# Patient Record
Sex: Female | Born: 1940 | Race: White | Hispanic: No | Smoking: Former smoker
Health system: Southern US, Community
[De-identification: ages and names within clinical notes are randomized; demographics above are authoritative.]

## PROBLEM LIST (undated history)

## (undated) ENCOUNTER — Emergency Department: Disposition: A | Discharge status: Still In Hospital | Payer: PPO

## (undated) DIAGNOSIS — H9190 Unspecified hearing loss, unspecified ear: Secondary | ICD-10-CM

## (undated) DIAGNOSIS — J449 Chronic obstructive pulmonary disease, unspecified: Secondary | ICD-10-CM

## (undated) DIAGNOSIS — I10 Essential (primary) hypertension: Secondary | ICD-10-CM

## (undated) DIAGNOSIS — Z973 Presence of spectacles and contact lenses: Secondary | ICD-10-CM

## (undated) DIAGNOSIS — Z972 Presence of dental prosthetic device (complete) (partial): Secondary | ICD-10-CM

## (undated) DIAGNOSIS — M1990 Unspecified osteoarthritis, unspecified site: Secondary | ICD-10-CM

## (undated) DIAGNOSIS — F419 Anxiety disorder, unspecified: Secondary | ICD-10-CM

## (undated) HISTORY — PX: APPENDECTOMY: SHX54

## (undated) HISTORY — PX: OTHER SURGICAL HISTORY: SHX169

## (undated) HISTORY — DX: Anxiety disorder, unspecified: F41.9

## (undated) HISTORY — DX: Essential (primary) hypertension: I10

## (undated) HISTORY — PX: TUBAL LIGATION: SHX77

---

## 1983-07-21 HISTORY — PX: BREAST SURGERY: SHX581

## 1999-05-26 ENCOUNTER — Other Ambulatory Visit: Admission: RE | Admit: 1999-05-26 | Discharge: 1999-05-26 | Payer: Self-pay | Admitting: Obstetrics and Gynecology

## 2000-06-03 ENCOUNTER — Other Ambulatory Visit: Admission: RE | Admit: 2000-06-03 | Discharge: 2000-06-03 | Payer: Self-pay | Admitting: Obstetrics and Gynecology

## 2000-08-02 ENCOUNTER — Encounter: Payer: Self-pay | Admitting: Obstetrics and Gynecology

## 2000-08-03 ENCOUNTER — Encounter (INDEPENDENT_AMBULATORY_CARE_PROVIDER_SITE_OTHER): Payer: Self-pay | Admitting: Specialist

## 2000-08-03 ENCOUNTER — Ambulatory Visit (HOSPITAL_COMMUNITY): Admission: RE | Admit: 2000-08-03 | Discharge: 2000-08-03 | Payer: Self-pay | Admitting: Obstetrics and Gynecology

## 2001-06-08 ENCOUNTER — Other Ambulatory Visit: Admission: RE | Admit: 2001-06-08 | Discharge: 2001-06-08 | Payer: Self-pay | Admitting: Obstetrics and Gynecology

## 2002-07-25 ENCOUNTER — Other Ambulatory Visit: Admission: RE | Admit: 2002-07-25 | Discharge: 2002-07-25 | Payer: Self-pay | Admitting: Obstetrics and Gynecology

## 2003-10-10 ENCOUNTER — Other Ambulatory Visit: Admission: RE | Admit: 2003-10-10 | Discharge: 2003-10-10 | Payer: Self-pay | Admitting: Obstetrics and Gynecology

## 2004-07-17 ENCOUNTER — Ambulatory Visit: Payer: Self-pay | Admitting: Family Medicine

## 2004-11-27 ENCOUNTER — Other Ambulatory Visit: Admission: RE | Admit: 2004-11-27 | Discharge: 2004-11-27 | Payer: Self-pay | Admitting: Obstetrics and Gynecology

## 2006-03-16 ENCOUNTER — Ambulatory Visit: Payer: Self-pay | Admitting: Internal Medicine

## 2006-11-05 ENCOUNTER — Ambulatory Visit: Payer: Self-pay | Admitting: Family Medicine

## 2006-11-24 ENCOUNTER — Encounter (INDEPENDENT_AMBULATORY_CARE_PROVIDER_SITE_OTHER): Payer: Self-pay | Admitting: Internal Medicine

## 2006-12-06 ENCOUNTER — Ambulatory Visit: Payer: Self-pay | Admitting: Family Medicine

## 2006-12-06 DIAGNOSIS — I10 Essential (primary) hypertension: Secondary | ICD-10-CM | POA: Insufficient documentation

## 2006-12-06 DIAGNOSIS — J309 Allergic rhinitis, unspecified: Secondary | ICD-10-CM | POA: Insufficient documentation

## 2007-01-24 ENCOUNTER — Ambulatory Visit: Payer: Self-pay | Admitting: Internal Medicine

## 2007-02-14 ENCOUNTER — Ambulatory Visit: Payer: Self-pay | Admitting: Internal Medicine

## 2007-02-18 ENCOUNTER — Telehealth (INDEPENDENT_AMBULATORY_CARE_PROVIDER_SITE_OTHER): Payer: Self-pay | Admitting: Internal Medicine

## 2007-02-21 ENCOUNTER — Telehealth (INDEPENDENT_AMBULATORY_CARE_PROVIDER_SITE_OTHER): Payer: Self-pay | Admitting: Internal Medicine

## 2007-04-19 ENCOUNTER — Encounter (INDEPENDENT_AMBULATORY_CARE_PROVIDER_SITE_OTHER): Payer: Self-pay | Admitting: *Deleted

## 2007-05-03 DIAGNOSIS — J45909 Unspecified asthma, uncomplicated: Secondary | ICD-10-CM

## 2007-05-03 DIAGNOSIS — F17200 Nicotine dependence, unspecified, uncomplicated: Secondary | ICD-10-CM | POA: Insufficient documentation

## 2007-05-03 DIAGNOSIS — J45901 Unspecified asthma with (acute) exacerbation: Secondary | ICD-10-CM | POA: Insufficient documentation

## 2007-05-04 ENCOUNTER — Ambulatory Visit: Payer: Self-pay | Admitting: Family Medicine

## 2007-05-04 DIAGNOSIS — F329 Major depressive disorder, single episode, unspecified: Secondary | ICD-10-CM

## 2007-05-04 DIAGNOSIS — F32A Depression, unspecified: Secondary | ICD-10-CM | POA: Insufficient documentation

## 2007-05-16 ENCOUNTER — Ambulatory Visit: Payer: Self-pay | Admitting: Cardiology

## 2007-05-16 LAB — CONVERTED CEMR LAB: TSH: 2.63 microintl units/mL (ref 0.35–5.50)

## 2007-05-23 ENCOUNTER — Ambulatory Visit: Payer: Self-pay

## 2007-05-23 ENCOUNTER — Encounter: Payer: Self-pay | Admitting: Cardiology

## 2007-05-25 ENCOUNTER — Ambulatory Visit: Payer: Self-pay | Admitting: Family Medicine

## 2007-06-03 ENCOUNTER — Ambulatory Visit: Payer: Self-pay | Admitting: Cardiology

## 2007-10-31 ENCOUNTER — Telehealth (INDEPENDENT_AMBULATORY_CARE_PROVIDER_SITE_OTHER): Payer: Self-pay | Admitting: Internal Medicine

## 2007-12-13 ENCOUNTER — Ambulatory Visit: Payer: Self-pay | Admitting: Family Medicine

## 2007-12-13 DIAGNOSIS — R062 Wheezing: Secondary | ICD-10-CM | POA: Insufficient documentation

## 2008-05-24 ENCOUNTER — Ambulatory Visit: Payer: Self-pay | Admitting: Cardiology

## 2008-07-03 ENCOUNTER — Ambulatory Visit: Payer: Self-pay | Admitting: Family Medicine

## 2008-10-10 ENCOUNTER — Encounter (INDEPENDENT_AMBULATORY_CARE_PROVIDER_SITE_OTHER): Payer: Self-pay | Admitting: Internal Medicine

## 2008-11-06 ENCOUNTER — Encounter (INDEPENDENT_AMBULATORY_CARE_PROVIDER_SITE_OTHER): Payer: Self-pay | Admitting: Internal Medicine

## 2008-12-25 ENCOUNTER — Ambulatory Visit: Payer: Self-pay | Admitting: Family Medicine

## 2008-12-26 ENCOUNTER — Telehealth (INDEPENDENT_AMBULATORY_CARE_PROVIDER_SITE_OTHER): Payer: Self-pay | Admitting: Internal Medicine

## 2009-01-08 ENCOUNTER — Ambulatory Visit: Payer: Self-pay | Admitting: Family Medicine

## 2009-01-09 LAB — CONVERTED CEMR LAB
BUN: 20 mg/dL (ref 6–23)
CO2: 29 meq/L (ref 19–32)
Calcium: 9.1 mg/dL (ref 8.4–10.5)
Chloride: 106 meq/L (ref 96–112)
Cholesterol: 147 mg/dL (ref 0–200)
Creatinine, Ser: 1.7 mg/dL — ABNORMAL HIGH (ref 0.4–1.2)
GFR calc non Af Amer: 31.59 mL/min (ref >60)
Glucose, Bld: 96 mg/dL (ref 70–99)
HDL: 48.4 mg/dL (ref >39.00)
LDL Cholesterol: 83 mg/dL (ref 0–99)
Potassium: 4.1 meq/L (ref 3.5–5.1)
Sodium: 143 meq/L (ref 135–145)
Total CHOL/HDL Ratio: 3
Triglycerides: 78 mg/dL (ref 0.0–149.0)
VLDL: 15.6 mg/dL (ref 0.0–40.0)

## 2009-04-22 ENCOUNTER — Encounter (INDEPENDENT_AMBULATORY_CARE_PROVIDER_SITE_OTHER): Payer: Self-pay | Admitting: Internal Medicine

## 2009-06-24 ENCOUNTER — Ambulatory Visit: Payer: Self-pay | Admitting: Family Medicine

## 2009-06-26 LAB — CONVERTED CEMR LAB
BUN: 20 mg/dL (ref 6–23)
CO2: 28 meq/L (ref 19–32)
Calcium: 9.1 mg/dL (ref 8.4–10.5)
Chloride: 104 meq/L (ref 96–112)
Creatinine, Ser: 1.8 mg/dL — ABNORMAL HIGH (ref 0.4–1.2)
GFR calc non Af Amer: 29.54 mL/min (ref >60)
Glucose, Bld: 90 mg/dL (ref 70–99)
Potassium: 3.9 meq/L (ref 3.5–5.1)
Sodium: 141 meq/L (ref 135–145)

## 2009-07-17 ENCOUNTER — Encounter (INDEPENDENT_AMBULATORY_CARE_PROVIDER_SITE_OTHER): Payer: Self-pay | Admitting: Internal Medicine

## 2009-07-17 ENCOUNTER — Ambulatory Visit: Payer: Self-pay | Admitting: Family Medicine

## 2009-07-17 DIAGNOSIS — N259 Disorder resulting from impaired renal tubular function, unspecified: Secondary | ICD-10-CM | POA: Insufficient documentation

## 2009-08-07 ENCOUNTER — Encounter: Payer: Self-pay | Admitting: Family Medicine

## 2009-09-03 ENCOUNTER — Telehealth (INDEPENDENT_AMBULATORY_CARE_PROVIDER_SITE_OTHER): Payer: Self-pay | Admitting: *Deleted

## 2009-10-23 ENCOUNTER — Telehealth: Payer: Self-pay | Admitting: Family Medicine

## 2009-11-13 ENCOUNTER — Ambulatory Visit: Payer: Self-pay | Admitting: Family Medicine

## 2009-11-13 DIAGNOSIS — N183 Chronic kidney disease, stage 3 unspecified: Secondary | ICD-10-CM | POA: Insufficient documentation

## 2010-01-29 ENCOUNTER — Encounter: Payer: Self-pay | Admitting: Family Medicine

## 2010-04-24 ENCOUNTER — Ambulatory Visit: Payer: Self-pay | Admitting: Family Medicine

## 2010-04-25 LAB — CONVERTED CEMR LAB
BUN: 20 mg/dL (ref 6–23)
CO2: 29 meq/L (ref 19–32)
Calcium: 9.1 mg/dL (ref 8.4–10.5)
Chloride: 100 meq/L (ref 96–112)
Creatinine, Ser: 1.7 mg/dL — ABNORMAL HIGH (ref 0.4–1.2)
Direct LDL: 94.8 mg/dL
GFR calc non Af Amer: 31.69 mL/min (ref >60)
Glucose, Bld: 85 mg/dL (ref 70–99)
Potassium: 4.2 meq/L (ref 3.5–5.1)
Sodium: 137 meq/L (ref 135–145)

## 2010-08-14 ENCOUNTER — Ambulatory Visit
Admission: RE | Admit: 2010-08-14 | Discharge: 2010-08-14 | Payer: Self-pay | Source: Home / Self Care | Attending: Family Medicine | Admitting: Family Medicine

## 2010-08-21 NOTE — Consult Note (Signed)
Summary: Bianca [name],[facility]Note  Dr.Alvin [name], [facility]Note   Imported By: Virgia Land 08/13/2009 16:30:06  _____________________________________________________________________  External Attachment:    Type:   Image     Comment:   External Document

## 2010-08-21 NOTE — Assessment & Plan Note (Signed)
Summary: 30 MIN 4 MTH F/U PER [facility] R/S 4/25   Vital Signs:  Patient profile:   70 year old Bianca [name] Height:      66 inches Weight:      142.38 pounds BMI:     23.06 Temp:     98.1 degrees F oral Pulse rate:   84 / minute Pulse rhythm:   regular BP sitting:   114 / 74  (left arm) Cuff size:   regular  Vitals Entered By: Christena Deem CMA Deborra Medina) (November 13, 2009 3:11 PM) CC: 4 month follow up  (30 min)   History of Present Illness: 70 yo Bianca [name] new to me here for follow up BP and COPD.  HTN- last here in 06/2009 and BP was elevated to 158/76.  Has CKD so BP control is very important.  Has been on Diovan 80-12.5 mg, dose was not increased.  Today BP in office MUCH improved at [place].  Trying to watch salt intake and walking a lot more.  COPD- Using Advair daily and proventil only as needed for wheezing or chest tightness-, cannot remember last time she had to use her Proventil.  Walking seems to have really helped her breathing as well.  CKD- stage III, reviewed Dr. Abel Presto notes.  Has follow up scheduled in July.   Current Medications (verified): 1)  Allegra 180 Mg Tabs (Fexofenadine Hcl) .... As Directed 2)  Advair Diskus 100-50 Mcg/dose Misc (Fluticasone-Salmeterol) .Marland Kitchen.. 1 Puff Twice A Day 3)  Proventil Hfa 108 (90 Base) Mcg/act  Aers (Albuterol Sulfate) .... 2 Sprays Every 4 Hr As Needed Wheezing 4)  Multivitamins   Tabs (Multiple Vitamin) .... One Daily 5)  Diovan Hct 80-12.5 Mg Tabs (Valsartan-Hydrochlorothiazide) .... Take 1 Tab Daily  Allergies (verified): No Known Drug Allergies  Review of Systems      See HPI General:  Denies chills and fever. Eyes:  Denies blurring. ENT:  Denies difficulty swallowing. CV:  Denies chest pain or discomfort. Resp:  Denies shortness of breath, sputum productive, and wheezing. Neuro:  Denies headaches and visual disturbances.  Physical Exam  General:  alert, well-developed, well-nourished, and well-hydrated.  NAD Ears:  R ear  normal and L ear normal.   Nose:  External nasal examination shows no deformity or inflammation. Nasal mucosa are pink and moist without lesions or exudates. Mouth:  Oral mucosa and oropharynx without lesions or exudates.  Teeth in good repair. Lungs:  normal respiratory effort, no intercostal retractions, no accessory muscle use, and normal breath sounds.   Heart:  normal rate, regular rhythm, and no murmur.   Extremities:  no edema either lower leg Psych:  normally interactive, flat affect, and subdued.     Impression & Recommendations:  Problem # 1:  HYPERTENSION, BENIGN ESSENTIAL (ICD-401.1) Assessment Improved Congratulated her on her success, continue low salt diet and walking. Called in lower dose of Diovan so whe would not have to cut pills in half. The following medications were removed from the medication list:    Diovan Hct 160-12.5 Mg Tabs (Valsartan-hydrochlorothiazide) .Marland Kitchen... 1/2 tab by mouth daily Her updated medication list for this problem includes:    Diovan Hct 80-12.5 Mg Tabs (Valsartan-hydrochlorothiazide) .Marland Kitchen... Take 1 tab daily  Problem # 2:  ASTHMA (ICD-493.90) Assessment: Unchanged Stable.  Continue Advair with rescue inhaler as needed. Her updated medication list for this problem includes:    Advair Diskus 100-50 Mcg/dose Misc (Fluticasone-salmeterol) .Marland Kitchen... 1 puff twice a day    Proventil Hfa 108 (90 Base)  Mcg/act Aers (Albuterol sulfate) .Marland Kitchen... 2 sprays every 4 hr as needed wheezing  Problem # 3:  CHRONIC KIDNEY DISEASE STAGE III (MODERATE) (ICD-585.3) Assessment: Unchanged likely secondary to HTN. STable, continue controlling BP.  Follow up with Dr. Florene Glen as scheduled.  Complete Medication List: 1)  Allegra 180 Mg Tabs (Fexofenadine hcl) .... As directed 2)  Advair Diskus 100-50 Mcg/dose Misc (Fluticasone-salmeterol) .Marland Kitchen.. 1 puff twice a day 3)  Proventil Hfa 108 (90 Base) Mcg/act Aers (Albuterol sulfate) .... 2 sprays every 4 hr as needed wheezing 4)   Multivitamins Tabs (Multiple vitamin) .... One daily 5)  Diovan Hct 80-12.5 Mg Tabs (Valsartan-hydrochlorothiazide) .... Take 1 tab daily Prescriptions: DIOVAN HCT 80-12.5 MG TABS (VALSARTAN-HYDROCHLOROTHIAZIDE) Take 1 tab daily  #90 x 3   Entered and Authorized by:   Arnette Norris MD   Signed by:   Arnette Norris MD on 11/13/2009   Method used:   Electronically to        [facility]. [phone]* (retail)       [place],   [address]       Ph: [phone] or [phone]       Fax: [phone]   RxID:   [id number]   Current Allergies (reviewed today): No known allergies

## 2010-08-21 NOTE — Assessment & Plan Note (Signed)
Summary: F/U   Vital Signs:  Patient profile:   70 year old female Height:      66 inches Weight:      142 pounds BMI:     23.00 Temp:     98.5 degrees F oral Pulse rate:   68 / minute Pulse rhythm:   regular BP sitting:   120 / 60  (left arm) Cuff size:   regular  Vitals Entered By: Sherrian Divers CMA Deborra Medina) (April 24, 2010 11:46 AM) CC: follow-up visit   History of Present Illness: 70 yo female new here for follow up BP and COPD.  HTN- Dr. Florene Glen (nephrology) added amlodipine 5 mg to her Diovan HCT 80-12.5 mg in July.  BP has been well controlled.  One night, felt a little light headed but she does not think it was related to her antihypertensives.  No CP, SOB, or syncope.   No blurred vision.  Weight stable.    COPD- Using Advair daily and proventil only as needed for wheezing or chest tightness-, cannot remember last time she had to use her Proventil.  Walking seems to have really helped her breathing as well.  CKD- stage III, reviewed Dr. Abel Presto notes from July, stable.   Current Medications (verified): 1)  Allegra 180 Mg Tabs (Fexofenadine Hcl) .... As Directed 2)  Advair Diskus 100-50 Mcg/dose Misc (Fluticasone-Salmeterol) .Marland Kitchen.. 1 Puff Twice A Day 3)  Proventil Hfa 108 (90 Base) Mcg/act  Aers (Albuterol Sulfate) .... 2 Sprays Every 4 Hr As Needed Wheezing 4)  Multivitamins   Tabs (Multiple Vitamin) .... One Daily 5)  Diovan Hct 80-12.5 Mg Tabs (Valsartan-Hydrochlorothiazide) .... Take 1 Tab Daily  Allergies (verified): No Known Drug Allergies  Review of Systems      See HPI General:  Denies malaise. Eyes:  Denies blurring. CV:  Denies chest pain or discomfort. Resp:  Denies shortness of breath.  Physical Exam  General:  alert, well-developed, well-nourished, and well-hydrated.  NAD Mouth:  Oral mucosa and oropharynx without lesions or exudates.  Teeth in good repair. Lungs:  normal respiratory effort, no intercostal retractions, no accessory muscle use, and  normal breath sounds.   Heart:  normal rate, regular rhythm, and no murmur.   Extremities:  no edema either lower leg Neurologic:  alert & oriented X3 and gait normal.   Psych:  normally interactive, flat affect, and subdued.     Impression & Recommendations:  Problem # 1:  CHRONIC KIDNEY DISEASE STAGE III (MODERATE) (ICD-585.3) Assessment Unchanged Stable.   Orders: Venipuncture [id number]) TLB-BMP (Basic Metabolic Panel-BMET) ([id number])  Problem # 2:  HYPERTENSION, BENIGN ESSENTIAL (ICD-401.1) Assessment: Improved Doing well with addition of Amlodipine, will contine to watch for hypotension. Her updated medication list for this problem includes:    Diovan Hct 80-12.5 Mg Tabs (Valsartan-hydrochlorothiazide) .Marland Kitchen... Take 1 tab daily  Problem # 3:  ASTHMA (ICD-493.90) Assessment: Unchanged Doing well, given samples of Advair. Her updated medication list for this problem includes:    Advair Diskus 100-50 Mcg/dose Misc (Fluticasone-salmeterol) .Marland Kitchen... 1 puff twice a day    Proventil Hfa 108 (90 Base) Mcg/act Aers (Albuterol sulfate) .Marland Kitchen... 2 sprays every 4 hr as needed wheezing  Complete Medication List: 1)  Allegra 180 Mg Tabs (Fexofenadine hcl) .... As directed 2)  Advair Diskus 100-50 Mcg/dose Misc (Fluticasone-salmeterol) .Marland Kitchen.. 1 puff twice a day 3)  Proventil Hfa 108 (90 Base) Mcg/act Aers (Albuterol sulfate) .... 2 sprays every 4 hr as needed wheezing 4)  Multivitamins Tabs (Multiple  vitamin) .... One daily 5)  Diovan Hct 80-12.5 Mg Tabs (Valsartan-hydrochlorothiazide) .... Take 1 tab daily  Other Orders: TLB-Cholesterol, Direct LDL ([id number])  Current Allergies (reviewed today): No known allergies

## 2010-08-21 NOTE — Letter (Signed)
Summary: [facility]   Imported By: Edmonia James 02/07/2010 10:51:40  _____________________________________________________________________  External Attachment:    Type:   Image     Comment:   External Document

## 2010-08-21 NOTE — Assessment & Plan Note (Signed)
Summary: ?SINUS INFECTION/CLE   Vital Signs:  Patient profile:   70 year old female Weight:      142.50 pounds Temp:     98.1 degrees F oral Pulse rate:   96 / minute Pulse rhythm:   regular BP sitting:   132 / 82  (left arm) Cuff size:   regular  Vitals Entered By: Maudie Mercury Dance CMA ([facility]) (August 14, 2010 11:03 AM) CC: ? Sinus infection   History of Present Illness: CC: "i think i have a sinus infection"  2d h/o facial pain, nasal congested especially at night.  purulent nasal discharge.  some cough.  + PNdrip.  Some sinus pressure HA yesterday.  Taking allegra for this.    No fevers/chills, abd pain, n/v/d, rashes, myalgias.  + grandson and daughter and son in law with similar sxs. + h/o asthma, quit smoking 5-6 yrs ago No smokers at home.  pt states that has had sinus infections in past, if they get into chest then does poorly.  Current Medications (verified): 1)  Allegra 180 Mg Tabs (Fexofenadine Hcl) .... As Directed 2)  Advair Diskus 100-50 Mcg/dose Misc (Fluticasone-Salmeterol) .Marland Kitchen.. 1 Puff Twice A Day 3)  Proventil Hfa 108 (90 Base) Mcg/act  Aers (Albuterol Sulfate) .... 2 Sprays Every 4 Hr As Needed Wheezing 4)  Multivitamins   Tabs (Multiple Vitamin) .... One Daily 5)  Diovan Hct 80-12.5 Mg Tabs (Valsartan-Hydrochlorothiazide) .... Take 1 Tab Daily  Allergies (verified): No Known Drug Allergies  Past History:  Past Medical History: Last updated: 05/03/2007 Asthma  Past Surgical History: appendectomy cysts removed from foot cysts removed from breast  Social History: quit smoking 2006  Marital Status: Married Children:  Occupation: retired  Review of Systems       per HPI  Physical Exam  General:  alert, well-developed, well-nourished, and well-hydrated.  NAD Head:  Normocephalic and atraumatic without obvious abnormalities. No apparent alopecia or balding.  + maxillary sinus pressure to palpation Eyes:  No corneal or conjunctival inflammation  noted. EOMI. Perrla.  Ears:  TMs clear Nose:  nares congested bilaterally Mouth:  MMM, no pharyngeal erythema Neck:  No deformities, masses, or tenderness noted. Lungs:  normal respiratory effort, no intercostal retractions, no accessory muscle use, and normal breath sounds.   Heart:  normal rate, regular rhythm, and no murmur.   Pulses:  2+ rad pulses Extremities:  no edema either lower leg   Impression & Recommendations:  Problem # 1:  SINUSITIS- ACUTE-NOS (ICD-461.9) likely viral.  hwoever given age and h/o doing poorly with sinusitis in past, WASP script for amox given to hold on to.  advised to fill if not better by next week.  supportive care until then with neti pot, guaifenesin.  Her updated medication list for this problem includes:    Amoxicillin 500 Mg Caps (Amoxicillin) .Marland Kitchen... Take one twice daily x 10 days  Complete Medication List: 1)  Allegra 180 Mg Tabs (Fexofenadine hcl) .... As directed 2)  Advair Diskus 100-50 Mcg/dose Misc (Fluticasone-salmeterol) .Marland Kitchen.. 1 puff twice a day 3)  Proventil Hfa 108 (90 Base) Mcg/act Aers (Albuterol sulfate) .... 2 sprays every 4 hr as needed wheezing 4)  Multivitamins Tabs (Multiple vitamin) .... One daily 5)  Diovan Hct 80-12.5 Mg Tabs (Valsartan-hydrochlorothiazide) .... Take 1 tab daily 6)  Amoxicillin 500 Mg Caps (Amoxicillin) .... Take one twice daily x 10 days  Patient Instructions: 1)  You have an early sinus infection, likely viral.  Antibiotics are not needed. 2)  Take guaifenesin 400mg  IR 1 pill in am and at noon with plenty of fluid to help mobilize mucous (or simple mucinex twice daily with fluid). 3)  Use nasal saline spray or neti pot to help drainage of sinuses. 4)  If you start having fevers >101.5, or are worsening instead of improving as expected, you may need to be seen again. 5)  Antibiotic to hold on to in case not improving as expected, don't fill until weekend or after as viral infections usually last 7-10 days  prior to improving. 6)  Good to see you today, call clinic with questions.  Prescriptions: AMOXICILLIN 500 MG CAPS (AMOXICILLIN) take one twice daily x 10 days  #20 x 0   Entered and Authorized by:   Ria Bush  MD   Signed by:   Ria Bush  MD on 08/14/2010   Method used:   Print then Give to Patient   RxID:   [id number]    Orders Added: 1)  Est. Patient Level III [id number]    Current Allergies (reviewed today): No known allergies

## 2010-08-21 NOTE — Progress Notes (Signed)
  Phone Note From Other Clinic   Summary of Call: Diabetic Teaching information from [facility] mailed to patient.Bianca [name]  September 03, 2009 10:22 AM Initial call taken by: Bianca [name],  September 03, 2009 10:22 AM

## 2010-09-25 ENCOUNTER — Ambulatory Visit (INDEPENDENT_AMBULATORY_CARE_PROVIDER_SITE_OTHER): Payer: MEDICARE | Admitting: Family Medicine

## 2010-09-25 ENCOUNTER — Ambulatory Visit: Payer: Self-pay | Admitting: Family Medicine

## 2010-09-25 ENCOUNTER — Encounter: Payer: Self-pay | Admitting: Family Medicine

## 2010-09-25 DIAGNOSIS — J069 Acute upper respiratory infection, unspecified: Secondary | ICD-10-CM | POA: Insufficient documentation

## 2010-09-27 ENCOUNTER — Encounter: Payer: Self-pay | Admitting: Family Medicine

## 2010-09-30 NOTE — Assessment & Plan Note (Signed)
Summary: CONGESTION,COUGH/CLE   MEDICARE [employer]   Vital Signs:  Patient profile:   70 year old female Height:      66 inches Weight:      142.75 pounds O2 Sat:      97 % on Room air Temp:     98.3 degrees F oral Pulse rate:   110 / minute Pulse rhythm:   regular BP sitting:   120 / 70  (right arm) Cuff size:   regular  Vitals Entered By: Sherrian Divers CMA Deborra Medina) (September 25, 2010 8:58 AM)  O2 Flow:  Room air CC: cough, congestion, sinus pressure   History of Present Illness: CC: "i think i have a sinus infection and bronchitis"  Over 1 week of  nasal congested especially at night.  purulent nasal discharge.  Cough is getting progressively more productive.   No fevers/chills, abd pain, n/v/d, rashes, myalgias.  Taking her Advair, using her albuterol inhaler more frequently over past several days.  + h/o asthma, quit smoking 5-6 yrs ago No smokers at home.   Current Medications (verified): 1)  Allegra 180 Mg Tabs (Fexofenadine Hcl) .... As Directed 2)  Advair Diskus 100-50 Mcg/dose Misc (Fluticasone-Salmeterol) .Marland Kitchen.. 1 Puff Twice A Day 3)  Proventil Hfa 108 (90 Base) Mcg/act  Aers (Albuterol Sulfate) .... 2 Sprays Every 4 Hr As Needed Wheezing 4)  Multivitamins   Tabs (Multiple Vitamin) .... One Daily 5)  Diovan Hct 80-12.5 Mg Tabs (Valsartan-Hydrochlorothiazide) .... Take 1 Tab Daily 6)  Azithromycin 250 Mg  Tabs (Azithromycin) .... 2 By  Mouth Today and Then 1 Daily For 4 Days 7)  Tessalon Perles 100 Mg  Caps (Benzonatate) .Marland Kitchen.. 1 Cap By Mouth Three Times A Day As Needed Cough  Allergies (verified): No Known Drug Allergies  Past History:  Past Medical History: Last updated: 05/03/2007 Asthma  Past Surgical History: Last updated: 08/14/2010 appendectomy cysts removed from foot cysts removed from breast  Social History: Last updated: 08/14/2010 quit smoking 2006  Marital Status: Married Children:  Occupation: retired  Risk Factors: Smoking Status: quit  (05/03/2007)  Review of Systems      See HPI General:  Denies fever. ENT:  Complains of nasal congestion, postnasal drainage, sinus pressure, and sore throat. Resp:  Complains of coughing up blood, sputum productive, and wheezing; denies shortness of breath. GI:  Denies abdominal pain and change in bowel habits.  Physical Exam  General:  alert, well-developed, well-nourished, and well-hydrated.  NAD VSS Nose:  nares congested bilaterally Mouth:  MMM, no pharyngeal erythema Lungs:  normal respiratory effort, no intercostal retractions, no accessory muscle use, exp wheezes, left >right Heart:  normal rate, regular rhythm, and no murmur.   Extremities:  no edema either lower leg Psych:  normally interactive, flat affect, and subdued.     Impression & Recommendations:  Problem # 1:  URI (ICD-465.9) Assessment New  Given duration and progression of symptoms, will treat for bacterial process with Zpack. See pt instructions for details. Her updated medication list for this problem includes:    Allegra 180 Mg Tabs (Fexofenadine hcl) .Marland Kitchen... As directed    Tessalon Perles 100 Mg Caps (Benzonatate) .Marland Kitchen... 1 cap by mouth three times a day as needed cough  Orders: Prescription Created Electronically [phone])  Complete Medication List: 1)  Allegra 180 Mg Tabs (Fexofenadine hcl) .... As directed 2)  Advair Diskus 100-50 Mcg/dose Misc (Fluticasone-salmeterol) .Marland Kitchen.. 1 puff twice a day 3)  Proventil Hfa 108 (90 Base) Mcg/act Aers (Albuterol sulfate) .Marland KitchenMarland KitchenMarland Kitchen  2 sprays every 4 hr as needed wheezing 4)  Multivitamins Tabs (Multiple vitamin) .... One daily 5)  Diovan Hct 80-12.5 Mg Tabs (Valsartan-hydrochlorothiazide) .... Take 1 tab daily 6)  Azithromycin 250 Mg Tabs (Azithromycin) .... 2 by  mouth today and then 1 daily for 4 days 7)  Tessalon Perles 100 Mg Caps (Benzonatate) .Marland Kitchen.. 1 cap by mouth three times a day as needed cough  Patient Instructions: 1)  Take Zpack as directed.  Drink lots of  fluids.  Treat sympotmatically with Mucinex, nasal saline irrigation, and Tylenol/Ibuprofen.You can use warm compresses.  Cough suppressant at night. Call if not improving as expected in 5-7 days.  Prescriptions: TESSALON PERLES 100 MG  CAPS (BENZONATATE) 1 cap by mouth three times a day as needed cough  #30 x 0   Entered and Authorized by:   Arnette Norris MD   Signed by:   Arnette Norris MD on 09/25/2010   Method used:   Electronically to        [facility]. [phone]* (retail)       [place], [place]  [address]       Ph: [phone] or [phone]       Fax: [phone]   RxID:   [id number] AZITHROMYCIN 250 MG  TABS (AZITHROMYCIN) 2 by  mouth today and then 1 daily for 4 days  #6 x 0   Entered and Authorized by:   Arnette Norris MD   Signed by:   Arnette Norris MD on 09/25/2010   Method used:   Electronically to        [facility]. [phone]* (retail)       [place], [place]  [address]       Ph: [phone] or [phone]       Fax: [phone]   RxID:   [id number]    Orders Added: 1)  Prescription Created Electronically [id number] 2)  Est. Patient Level III [id number]    Current Allergies (reviewed today): No known allergies

## 2010-11-18 ENCOUNTER — Other Ambulatory Visit: Payer: Self-pay | Admitting: *Deleted

## 2010-11-18 MED ORDER — VALSARTAN-HYDROCHLOROTHIAZIDE 80-12.5 MG PO TABS: 1.0000 | ORAL_TABLET | Freq: Every day | ORAL | Status: DC

## 2010-11-25 ENCOUNTER — Telehealth: Payer: Self-pay | Admitting: *Deleted

## 2010-11-25 ENCOUNTER — Ambulatory Visit (INDEPENDENT_AMBULATORY_CARE_PROVIDER_SITE_OTHER): Payer: MEDICARE | Admitting: Family Medicine

## 2010-11-25 ENCOUNTER — Encounter: Payer: Self-pay | Admitting: Family Medicine

## 2010-11-25 VITALS — BP 130/64 | HR 60 | Temp 98.3°F | Ht 66.0 in | Wt 134.8 lb

## 2010-11-25 DIAGNOSIS — F4320 Adjustment disorder, unspecified: Secondary | ICD-10-CM | POA: Insufficient documentation

## 2010-11-25 MED ORDER — BUSPIRONE HCL 15 MG PO TABS: 15.0000 mg | ORAL_TABLET | ORAL | Status: DC

## 2010-11-25 MED ORDER — BUSPIRONE HCL 15 MG PO TABS: 15.0000 mg | ORAL_TABLET | Freq: Two times a day (BID) | ORAL | Status: DC

## 2010-11-25 NOTE — Telephone Encounter (Signed)
Patient advised as instructed via telephone. 

## 2010-11-25 NOTE — Telephone Encounter (Signed)
Yes please apologize to her.  I don't know why how that happened.  It is supposed to be twice a day. I will send new rx.

## 2010-11-25 NOTE — Assessment & Plan Note (Signed)
>  25 min spent with face to face with patient counseling and coordinating care Try Buspar 15 mg twice daily Pt to call in 2-3 weeks with update of symptoms. Declining psychotherapy at this time.

## 2010-11-25 NOTE — Telephone Encounter (Signed)
Patient stated that the Rx for Buspar is written for her to take it two times a week, but she stated that in the office visit today Dr. Deborra Medina advised her to take it two times daily.  Please advise.

## 2010-11-25 NOTE — Progress Notes (Signed)
70 yo female new here for anxiety/depression.  Daughter is having a tough time.  Does not want to talk about it but it is making her very anxious and tearful. Has lost 10 pounds in one week.  Can't eat or sleep, gets very anxious. Has tried Zoloft in past, made her "feel sick to her stomach." No panic attacks No SI or HI.    The PMH, PSH, Social History, Family History, Medications, and allergies have been reviewed in [facility], and have been updated if relevant.  Review of Systems       See HPI  Physical Exam BP 130/64  Pulse 60  Temp(Src) 98.3 F (36.8 C) (Oral)  Ht 5\' 6"  (1.676 m)  Wt 134 lb 12.8 oz (61.145 kg)  BMI 21.76 kg/m2 General:  alert, well-developed, well-nourished, and well-hydrated.  NAD Mouth:  Oral mucosa and oropharynx without lesions or exudates.  Teeth in good repair. Neurologic:  alert & oriented X3 and gait normal.   Psych:  normally interactive, flat affect, tearful.

## 2010-12-02 NOTE — Assessment & Plan Note (Signed)
[facility]                            CARDIOLOGY OFFICE NOTE   KIARAH, WIERINGA ANN F                  MRN:          [id number]  DATE:05/16/2007                            DOB:          1940/11/28    Ms. Bortnick is here for a cardiac evaluation.  She is a very pleasant 70-  year-old lady.  She is the mother of Candace.  She has had blood  pressure problems and some palpitations.  She has been on varied doses  of Diovan.  In addition, Avalide and Avapro have been tried.  Most  recently, she is on Diovan hydrochlorothiazide 160/12.5, and she takes  it one-half tablet twice a day.  She says that since this and sertraline  were both started together approximately 10 days ago, she has had  nausea.  The basis of the nausea is not clear, although the new drug in  this case is the sertraline.  She also says that on her blood pressure  cuff at home at times her systolic pressure is in the 90s.  It is  possible that we may need to back off on her medications completely and  watch her blood pressure at home.  She does seem to be somewhat anxious  and her pressure may be higher only when in the doctor's office.  She  does have mild increased resting heart rate.  She has not had syncope or  presyncope.  She does not have chest pain or significant shortness of  breath.   PAST MEDICAL HISTORY:  No known drug allergies.   MEDICATIONS:  1. Diovan hydrochlorothiazide 160/12.5 one-half b.i.d.  2. Sertraline 50.  3. Allegra 180.  4. Albuterol.  5. Advair.   OTHER MEDICAL PROBLEMS:  See the list below.   SOCIAL HISTORY:  The patient is married and retired.  She is here with  her husband today.  She does smoke in the range of 6-18 cigarettes  daily, and we have talked about continuing to wean that.   FAMILY HISTORY:  The family history at this time reveals that there is  no history of significant coronary disease in the family.   REVIEW OF SYSTEMS:  As  mentioned, she has had some nausea.  She is  urinating frequently as a result of the hydrochlorothiazide being added.  Otherwise, her review of systems is negative.   PHYSICAL EXAMINATION:  VITAL SIGNS:  Blood pressure is 140/88 with a  pulse of 105 today.  Her weight is 132 pounds.  GENERAL:  The patient is oriented to person, time and place.  Affect  reveals that she does appear to be a mildly nervous person at this time.  Her husband is helpful and in the room, and their interaction is good.  HEENT:  No xanthelasma.  She has normal extraocular motion.  There are  no carotid bruits.  There is no jugular venous distention.  LUNGS:  Clear.  Respiratory effort is not labored.  CARDIAC:  S1 with an S2.  There are no clicks or significant murmurs.  Her resting heart rate is mildly increased.  ABDOMEN:  Soft.  There are no masses or bruits.  She has normal bowel  sounds.  There is no significant peripheral edema.  Distal pulses are  2+.  There are no major musculoskeletal deformities.   ELECTROCARDIOGRAM:  EKG does reveal nonspecific ST-T wave abnormalities.  There is mild sinus tachycardia at rest.  She also has increased P  waves, compatible with right atrial enlargement.   LABORATORY DATA:  Labs send from [facility] office reveal that in May  her hemoglobin was 12.9.  HDL was 46, and her LDL was 84.  I do not have  a TSH at this time, and this will be checked.  Her BUN was 21 and  creatinine 1.5.   IMPRESSION:  Problems include:  1. Creatinine of 1.5.  2. Mild resting sinus tachycardia.  We will obtain a TSH.  3. Right atrial abnormality by EKG, in sinus.  We will obtain a 2-D      echocardiogram to assess left ventricular function and right      ventricular function and valvular function.  4. Hypertension.  At this point, she does not appear to tolerate her      medicines well.  She is watching her salt intake.  Pressures at      home have been on the low side.  We will decrease  her Diovan      hydrochlorothiazide to one-half tablet daily and have her follow      her pressures at home and document them and bring [place] her reported      data in the office.  5. Ongoing cigarette smoking, 6-8 cigarettes a day, and she is      encouraged to stop completely.  6. Asthma on medications, stable.  7. Some recent nausea.  The patient's only new medicine is Zoloft.      This will be held.  This will not be dangerous for her.  She may      well need an antidepressant over time, but we need to see if this      is playing a role in her nausea.   The patient knows to watch her salt intake carefully.  Zoloft is to be  held and 2-D echocardiogram obtained, along with a TSH.  We will  consider adding a beta blocker for resting sinus tachycardia later after  we have more information.  I will see her back for early follow up.     Carlena Bjornstad, MD, [facility]  Electronically Signed    JDK/MedQ  DD: 05/16/2007  DT: 05/16/2007  Job #: [id number]   cc:   Billie D. Bean, FNP

## 2010-12-02 NOTE — Assessment & Plan Note (Signed)
[facility]                            CARDIOLOGY OFFICE NOTE   Bianca [name]                  MRN:          [id number]  DATE:05/24/2008                            DOB:          12/26/1940    [name] is seen for followup.  She has had some mild resting sinus  tachycardia.  She is actually doing well.  She is not having any chest  pain or shortness of breath.  She has had no syncope or presyncope.  She  says she feels much better since she started drinking more of fluid.  This is not optimal with her being on a diuretic, but she feels well  overall.   ALLERGIES:  No known drug allergies.   MEDICATIONS:  See the flow sheet.   OTHER MEDICAL PROBLEMS:  See the note of June 03, 2007.   REVIEW OF SYSTEMS:  She has no GI or GU complaints.  She is not having  any fevers or chills or headaches.  Her review of systems otherwise is  negative.   PHYSICAL EXAMINATION:  VITAL SIGNS:  Blood pressure is 135/84 with a  pulse of 83.  GENERAL  The patient is oriented to person, time, and place.  Affect is  normal.  HEENT:  No xanthelasma.  She has normal extraocular motion.  NECK:  There are no carotid bruits.  There is no jugular venous  distention.  LUNGS:  Clear.  Respiratory effort is not labored.  CARDIAC:  S1 with an S2.  There are no clicks or significant murmurs.  ABDOMEN:  Soft.  EXTREMITIES:  She has no peripheral edema.   EKG reveals normal sinus rhythm.   Problems are listed on my note of June 03, 2007.  There is a history  of mild resting sinus tachycardia.  This is stable and needs no further  workup.  Her blood pressure is controlled.  I talked to her about her  smoking.  She has cut down to only a few cigarettes a day.  She will  follow with Billie Bean.  No further cardiac workup.     Carlena Bjornstad, MD, [facility]  Electronically Signed    JDK/MedQ  DD: 05/24/2008  DT: 05/25/2008  Job #: [id number]   cc:   Billie D.  Bean, FNP

## 2010-12-02 NOTE — Assessment & Plan Note (Signed)
[facility]                            CARDIOLOGY OFFICE NOTE   Bianca [name], Bianca [name]                  MRN:          [id number]  DATE:06/03/2007                            DOB:          Nov 19, 1940    Bianca [name] is now doing well.  See my complete note of May 16, 2007.  We decided to leave her on 1/2 of a Diovan/hydrochlorothiazide 60/12.5.  Her Sertraline was stopped.  Her nausea is better.  Her 2-D  echocardiogram shows excellent left ventricular function and no  significant valvular abnormalities.  She is feeling better.  Her TSH is  normal.  Her blood pressures since her last visit range from 110/62 to  145/87.  She does have significant stress at home related to a sister  and other more distant family members.  However, she is stable.  She has  not had any chest pain.  I have had a careful discussion with her today  and her husband.   PAST MEDICAL HISTORY:  For other medical problems, see the list below.   ALLERGIES:  No known drug allergies.   MEDICATIONS:  1. Diovan/hydrochlorothiazide 160/12.5 1/2 tablet daily.  2. Allegra.  3. Albuterol.  4. Advair.   REVIEW OF SYSTEMS:  She is doing well and her review of systems now is  negative.   PHYSICAL EXAMINATION:  VITAL SIGNS:  Blood pressure today is 110/70 with  pulse of 87.  GENERAL:  The patient is oriented to person, time and place. Affect is  normal.  She is here with her husband in the room at the time of  evaluation.  HEENT:  Reveals no xanthelasma.  She has normal extraocular motion.  NECK:  There are no carotid bruits.  There is no jugular venous  distention.  LUNGS:  Clear.  Respiratory effort is not labored.  CARDIAC:  Exam reveals an S1 with an S2.  There are no clicks or  significant murmurs.  ABDOMEN:  Soft.  EXTREMITIES:  She has no peripheral edema.   As mentioned above, her TSH is normal and echocardiogram reveals no  significant abnormalities.   PROBLEM LIST:   Problems include:  1. History of creatinine 1.5.  2. Mild resting sinus tachycardia.  TSH is normal.  I believe it will      be best to follow the patient.  She could receive a small dose of      beta blocker.  However, I feel this is not absolutely necessary and      she and her husband would like to not have any additional      medicines.  3. Normal left ventricular function.  4. Hypertension.  This is nicely controlled on her current medicines.  5. Ongoing cigarette smoking, 6 to 8 per day.  She is once again      encouraged to stop but she says she cannot because of the stress in      her life.  6. History of asthma.  7. History of recent nausea which has improved.   PLAN:  I will see her back in one year.  Bianca Bjornstad, MD, Bianca [name]  Electronically Signed    JDK/MedQ  DD: 06/03/2007  DT: 06/04/2007  Job #: [id number]   cc:   Bianca Sams, FNP, [place]

## 2010-12-05 NOTE — Op Note (Signed)
[facility]  Patient:    Bianca [name], Bianca [name]                        MRN: [id number] Proc. Date: 08/03/00 Attending:  Harrold Donath, M.D.                           Operative Report  PREOPERATIVE DIAGNOSES:       1. Thickened endometrium.                               2. Endometrial polyp on hysterosonogram.  POSTOPERATIVE DIAGNOSES:      1. Postmenopausal.                               2. Thickened endometrium.                               3. Endometrial polyp.  SURGEON:                      Harrold Donath, M.D.  ANESTHESIA:                   General.  PACKS:                        None.  CATHETERS:                    None.  MEDIUM:                       Sorbitol.  DEFICIT:                      110 cc.  FINDINGS:                     The uterus sounded to 8 cm.  There was a polyp the entire length of the endometrial cavity.  It seemed to be attached to the fundus at the left lateral wall.  DESCRIPTION OF PROCEDURE:     The patient was taken to the operating room. After satisfactory general anesthesia with the patient in the prone position, she was prepped and draped as a sterile field.  The bladder was emptied with catheterization.  The uterus felt midplane.  A weighted speculum was placed in the posterior vagina.  The anterior cervix was grasped with a Jacobs tenaculum and pulled up.  The external os was dilated dilated with a hemostat.  The uterus then sounded to 8 cm.  It was progressively dilated to a #25.  The observation scope was placed in and we could see a polyp running the entire length of the endometrial cavity.  We took pictures.  We removed the observation scope and put the resectoscope in.  We remove some of the polyp with Randall stone grasping forceps.  We then dilated to a #33 and inserted the resectoscope and resected the upper portion of the polyp and some of the endometrial cavity.  We then cauterized any areas  that were bleeding.  We then progressively decreased the pressure.  She had no excess bleeding.  We removed the scope and watched the patient for two  minutes to see if she had any heavy bleeding, and she did not.  The procedure was terminated.  The patient was taken to the recovery room in good condition. DD:  08/03/00 TD:  08/03/00 Job: [id number]

## 2010-12-05 NOTE — H&P (Signed)
[facility]  Patient:    Bianca [name], Bianca [name]                        MRN: [id number] Adm. Date:  08/03/00 Attending:  Harrold Donath, M.D.                         History and Physical  OFFICE CHART:                 [id number]  REASON FOR ADMISSION:         This patient is for hysteroscopy and D&C on August 03, 2000.  HISTORY OF PRESENT ILLNESS:   This 70 year old female, para 3-1-2, is admitted to the hospital for hysteroscopy and evaluation of endometrial mass.  She had an ultrasound on July 27, 2000, to evaluate the endometrial cavity at which time she had a hysterosonography.  She had had a previous ultrasound which showed a thickened endometrium before Christmas.  The uterus measured 4.2 x 2.5 x 4.2.  The endometrial cavity had previously measured 5.4 mm with a questionable polyp .5 x .2.  We injected, and she had a .9 x .4 mass at what looked like at the fundus.  This could be a fibroid but probably a polyp.  She has no other fibroids.  The patient is admitted for resection and removal.  The complications with perforation had been explained to the patient, and she understands.  The patient has been a smoker.  She has had no smoking since December 2001.  The patient is currently taking Prempro, and she has had no bleeding.  MEDICATIONS:                  1. Wellbutrin b.i.d. for smoking.                               2. Chlor-Trimeton for sinuses.                               3. Prempro 2.5.  REVIEW OF SYSTEMS:            She has had no problem with sinus infection. She has had no recent respiratory problems.  PAST MEDICAL HISTORY:         She has had pneumonia in the past and was treated.  She has had tetanus shot about 6-7 years ago.  FAMILY HISTORY:               She has a brother and a father who had BTs.  She has a brother with heart disease.  She has a brother with a drinking problem. She has a sister with breast cancer at age 45.  PAST  SURGICAL HISTORY:        Appendectomy, D&C, wisdom teeth, tubal ligation, foot surgery.  PHYSICAL EXAMINATION:  GENERAL:                      Well-developed, well-nourished female, oriented and alert.  VITAL SIGNS:                  Blood pressure 157/95, weight 153.  HEART:                        Sounds good.  LUNGS:  Clear.  BREASTS:                      No masses.  LIVER:                        Not enlarged.  SPLEEN:                       Not enlarged.  VAGINA:                       Cervix epithelialized.  She has good vaginal support.  Uterus is not felt to be enlarged.  No masses felt at adnexa.  RECTAL:                       Negative.  DISPOSITION:                  Endometrial polyp or fibroid of the endometrial cavity, thickened endometrium, postmenopausal, on hormone replacement therapy which is Prempro 2.5, on Wellbutrin to stop smoking.  Admit for surgery. DD:  08/02/00 TD:  08/02/00 Job: [id number]

## 2010-12-16 ENCOUNTER — Other Ambulatory Visit: Payer: Self-pay | Admitting: *Deleted

## 2010-12-16 MED ORDER — VALSARTAN-HYDROCHLOROTHIAZIDE 80-12.5 MG PO TABS: 1.0000 | ORAL_TABLET | Freq: Every day | ORAL | Status: DC

## 2011-01-22 ENCOUNTER — Encounter: Payer: Self-pay | Admitting: Family Medicine

## 2011-01-22 ENCOUNTER — Ambulatory Visit (INDEPENDENT_AMBULATORY_CARE_PROVIDER_SITE_OTHER): Payer: Medicare Other | Admitting: Family Medicine

## 2011-01-22 VITALS — BP 120/62 | HR 96 | Temp 98.4°F | Wt 133.5 lb

## 2011-01-22 DIAGNOSIS — J45909 Unspecified asthma, uncomplicated: Secondary | ICD-10-CM

## 2011-01-22 DIAGNOSIS — J069 Acute upper respiratory infection, unspecified: Secondary | ICD-10-CM

## 2011-01-22 MED ORDER — AZITHROMYCIN 250 MG PO TABS: ORAL_TABLET | ORAL | Status: DC

## 2011-01-22 MED ORDER — ALBUTEROL SULFATE (2.5 MG/3ML) 0.083% IN NEBU
2.5000 mg | INHALATION_SOLUTION | Freq: Once | RESPIRATORY_TRACT | Status: AC
  Administered 2011-01-22: 2.5 mg via RESPIRATORY_TRACT

## 2011-01-22 MED ORDER — IPRATROPIUM BROMIDE 0.02 % IN SOLN
0.5000 mg | Freq: Once | RESPIRATORY_TRACT | Status: AC
  Administered 2011-01-22: 0.5 mg via RESPIRATORY_TRACT

## 2011-01-22 NOTE — Progress Notes (Signed)
CC: "i think i have  bronchitis"  Over 1 week of  Cough.  Cough is getting progressively more productive and wheezing is worsening.   No fevers/chills, abd pain, n/v/d, rashes, myalgias.  Taking her Advair, using her albuterol inhaler more frequently over past several days, used it 3 times yesterday.  + h/o asthma, quit smoking 7 years ago No smokers at home.   Patient Active Problem List  Diagnoses  . TOBACCO ABUSE  . DEPRESSION  . HYPERTENSION, BENIGN ESSENTIAL  . ALLERGIC  RHINITIS  . ASTHMA  . CHRONIC KIDNEY DISEASE STAGE III (MODERATE)  . RENAL INSUFFICIENCY  . WHEEZING  . URI  . Adjustment disorder   Past Medical History  Diagnosis Date  . Asthma    Past Surgical History  Procedure Date  . Appendectomy   . Other surgical history     cysts removed from foot  . Other surgical history     cysts removed from breast   History  Substance Use Topics  . Smoking status: Former Smoker    Quit date: 07/20/2004  . Smokeless tobacco: Not on file  . Alcohol Use: Not on file   No family history on file. No Known Allergies Current Outpatient Prescriptions on File Prior to Visit  Medication Sig Dispense Refill  . albuterol (PROVENTIL HFA) 108 (90 BASE) MCG/ACT inhaler Inhale 2 puffs into the lungs every 4 (four) hours as needed. For wheezing       . busPIRone (BUSPAR) 15 MG tablet Take 1 tablet (15 mg total) by mouth 2 (two) times daily.  60 tablet  11  . fexofenadine (ALLEGRA) 180 MG tablet Take 180 mg by mouth as directed.        . Fluticasone-Salmeterol (ADVAIR DISKUS) 100-50 MCG/DOSE AEPB Inhale 1 puff into the lungs 2 (two) times daily.        . Multiple Vitamin (MULTIVITAMIN) tablet Take 1 tablet by mouth daily.        . valsartan-hydrochlorothiazide (DIOVAN-HCT) 80-12.5 MG per tablet Take 1 tablet by mouth daily.  30 tablet  6   The PMH, PSH, Social History, Family History, Medications, and allergies have been reviewed in [facility], and have been updated if  relevant.   Review of Systems       See HPI General:  Denies fever. CVS:  No chest pain Resp:  Complains of coughing up blood, sputum productive, and wheezing; denies shortness of breath. GI:  Denies abdominal pain and change in bowel habits.  Physical Exam BP 120/62  Pulse 96  Temp(Src) 98.4 F (36.9 C) (Oral)  Wt 133 lb 8 oz (60.555 kg)  General:  alert, well-developed, well-nourished, and well-hydrated.  NAD VSS Nose:  nares congested bilaterally Mouth:  MMM, no pharyngeal erythema Lungs:  normal respiratory effort, no intercostal retractions, no accessory muscle use, diffuse bilateral exp wheezes Heart:  normal rate, regular rhythm, and no murmur.   Extremities:  no edema either lower leg Psych:  normally interactive, flat affect, and subdued.    Assessment and Plan: 1. URI    New.   With bronchitis. Will treat with Zpack. Duonebs given in office.

## 2011-04-13 ENCOUNTER — Other Ambulatory Visit: Payer: Self-pay | Admitting: *Deleted

## 2011-04-13 MED ORDER — FLUTICASONE-SALMETEROL 100-50 MCG/DOSE IN AEPB: 1.0000 | INHALATION_SPRAY | Freq: Two times a day (BID) | RESPIRATORY_TRACT | Status: DC

## 2011-04-15 ENCOUNTER — Other Ambulatory Visit: Payer: Self-pay | Admitting: *Deleted

## 2011-05-18 ENCOUNTER — Telehealth: Payer: Self-pay | Admitting: Family Medicine

## 2011-05-18 NOTE — Telephone Encounter (Signed)
Patient has been on prescription for Divan and pharmacy has filled it as generic today.  She has always taken it as brand name and has not taken generic in the past.  She said she has had no problems with the brand and has questions about why it was changed to generic.  Patient call back is [phone]

## 2011-05-19 MED ORDER — VALSARTAN-HYDROCHLOROTHIAZIDE 80-12.5 MG PO TABS: 1.0000 | ORAL_TABLET | Freq: Every day | ORAL | Status: DC

## 2011-05-19 NOTE — Telephone Encounter (Signed)
Ok to send in trade name Diovan.

## 2011-05-19 NOTE — Telephone Encounter (Signed)
Rx sent to pharmacy for brand name Diovan.  Patient notified via telephone.

## 2011-05-28 ENCOUNTER — Encounter: Payer: Self-pay | Admitting: Family Medicine

## 2011-05-28 ENCOUNTER — Ambulatory Visit (INDEPENDENT_AMBULATORY_CARE_PROVIDER_SITE_OTHER): Payer: Medicare Other | Admitting: Family Medicine

## 2011-05-28 VITALS — BP 150/80 | HR 86 | Temp 97.8°F | Wt 134.2 lb

## 2011-05-28 DIAGNOSIS — F329 Major depressive disorder, single episode, unspecified: Secondary | ICD-10-CM

## 2011-05-28 DIAGNOSIS — F3289 Other specified depressive episodes: Secondary | ICD-10-CM

## 2011-05-28 DIAGNOSIS — F4320 Adjustment disorder, unspecified: Secondary | ICD-10-CM

## 2011-05-28 DIAGNOSIS — N259 Disorder resulting from impaired renal tubular function, unspecified: Secondary | ICD-10-CM

## 2011-05-28 DIAGNOSIS — I10 Essential (primary) hypertension: Secondary | ICD-10-CM

## 2011-05-28 NOTE — Progress Notes (Signed)
70 yo female here for follow up BP and COPD.  HTN- BP elevated today but has not yet taken her medication.   Has CKD so BP control is very important.  Has been on Diovan 80-12.5 mg.  BPs have been stable at nephrologist and at home.  COPD- Using Advair daily and proventil only as needed for wheezing or chest tightness-, cannot remember last time she had to use her Proventil, thinks it was this summer.  CKD- stage III, reviewed Dr. Abel Presto notes.  Had follow up in July.  Cr stable.   Patient Active Problem List  Diagnoses  . TOBACCO ABUSE  . DEPRESSION  . HYPERTENSION, BENIGN ESSENTIAL  . ALLERGIC  RHINITIS  . ASTHMA  . CHRONIC KIDNEY DISEASE STAGE III (MODERATE)  . RENAL INSUFFICIENCY  . WHEEZING  . URI  . Adjustment disorder   Past Medical History  Diagnosis Date  . Asthma    Past Surgical History  Procedure Date  . Appendectomy   . Other surgical history     cysts removed from foot  . Other surgical history     cysts removed from breast   History  Substance Use Topics  . Smoking status: Former Smoker    Quit date: 07/20/2004  . Smokeless tobacco: Not on file  . Alcohol Use: Not on file   No family history on file. No Known Allergies Current Outpatient Prescriptions on File Prior to Visit  Medication Sig Dispense Refill  . albuterol (PROVENTIL HFA) 108 (90 BASE) MCG/ACT inhaler Inhale 2 puffs into the lungs every 4 (four) hours as needed. For wheezing       . busPIRone (BUSPAR) 15 MG tablet Take 1 tablet (15 mg total) by mouth 2 (two) times daily.  60 tablet  11  . fexofenadine (ALLEGRA) 180 MG tablet Take 180 mg by mouth as directed.        . Fluticasone-Salmeterol (ADVAIR DISKUS) 100-50 MCG/DOSE AEPB Inhale 1 puff into the lungs 2 (two) times daily.  60 each  12  . Multiple Vitamin (MULTIVITAMIN) tablet Take 1 tablet by mouth daily.        . valsartan-hydrochlorothiazide (DIOVAN-HCT) 80-12.5 MG per tablet Take 1 tablet by mouth daily.  30 tablet  6        Allergies (verified):  No Known Drug Allergies  Review of Systems       See HPI General:  Denies chills and fever. Eyes:  Denies blurring. ENT:  Denies difficulty swallowing. CV:  Denies chest pain or discomfort. Resp:  Denies shortness of breath, sputum productive, and wheezing. Neuro:  Denies headaches and visual disturbances.  Physical Exam BP 150/80  Pulse 86  Temp(Src) 97.8 F (36.6 C) (Oral)  Wt 134 lb 4 oz (60.895 kg)  General:  alert, well-developed, well-nourished, and well-hydrated.  NAD Ears:  R ear normal and L ear normal.   Nose:  External nasal examination shows no deformity or inflammation. Nasal mucosa are pink and moist without lesions or exudates. Mouth:  Oral mucosa and oropharynx without lesions or exudates.  Teeth in good repair. Lungs:  normal respiratory effort, no intercostal retractions, no accessory muscle use, and normal breath sounds.   Heart:  normal rate, regular rhythm, and no murmur.   Extremities:  no edema either lower leg Psych:  normally interactive, flat affect, and subdued.    Assessment and Plan: 70 yo female here for 6 month follow up.  DM- a1c was great in June- 6.3, best it has been.  Was as high as 12.0 last March. Checks his CBGs three times a day, fasting is usually around 100-125.  Takes Levemir 20 units at bedtime and uses his own Novlog sliding scale at night.  Denies any episodes of hypoglycemia.  Just got his diabetic shoes, loves them.  Very comfortable. Denies any neuropathy.    HLD- LDL not at goal for diabetic.     HLD- LDL 130, HDL 37, TG 118 in June.  Has cut out saturated fats completely, now only eats fat free cheese.  Current Medications (verified):  1)  Multivitamins  Tabs (Multiple Vitamin) .Marland Kitchen.. 1 Daily As Per Patient 2)  Bd Pen Needle Mini U/f 31g X 5 Mm Misc (Insulin Pen Needle) .... Use With Levemir Flex Pen 3)  Levemir Flexpen 100 Unit/ml Soln (Insulin Detemir) .... 20 Units At Bedtime For  Dm 4)  Prinivil 10 Mg Tabs (Lisinopril) .Marland Kitchen.. 1 Daily By Mouth 5)  Freestyle Lite Test  Strp (Glucose Blood) .... Use 1 To 3 Times A Day As Needed For  Flucuating Blood Sugars Icd-9 Code: 250.00 6)  Freestyle Lancets  Misc (Lancets) .... Use 1 To Three Times A Day To Check Blood Sugars Icd-9 Code 250.00 7)  Novolog Flexpen 100 Unit/ml Soln (Insulin Aspart) .... 8 Units Before Supper  Allergies (verified):  No Known Drug Allergies  Past History:  Past Medical History: Last updated: 10/12/2008 Diverticulosis, colon  Past Surgical History: Last updated: Nov 18, 2008 diverticulitis by flex:(1989)  Family History: Last updated: 11/18/2008 Father: DECEASED WITH BPH WITH TURB// PARKINSONS  AT AGE77 Mother: DECEASED AT AGE 42 Siblings:2 BROTHERS 1 SISTER CV: NEGATIVE HBP: NEGATIVE DM: NEGATIVE GOUT/ARTHRITIS: PROSTATE CANCER: BREAST/OVARIAN/UTERINE CANCER: NEGATIVE COLON CANCER: NEGATIVE DEPRESSION: NEGATIVE ETOH/DRUG ABUSE: NEGATIVE OTHER: NEGATIVE STROKE   Social History: Last updated: 10/05/2008 Marital Status: Married Children: 2 Occupation: retired from [employer] Factors: Alcohol Use: 0 (10/05/2008) Caffeine Use: 3 (10/05/2008) Exercise: yes (10/05/2008)  Risk Factors: Smoking Status: never (10/05/2008)  Review of Systems       See HPI General:  Denies malaise. Eyes:  Denies blurring. ENT:  Denies difficulty swallowing. CV:  Denies chest pain or discomfort. Resp:  Denies shortness of breath. GI:  Denies abdominal pain and change in bowel habits.  Physical Exam BP 150/80  Pulse 86  Temp(Src) 97.8 F (36.6 C) (Oral)  Wt 134 lb 4 oz (60.895 kg) BP Readings from Last 3 Encounters:  05/28/11 150/80  01/22/11 120/62  11/25/10 130/64    General:  alert, well-developed, well-nourished, and well-hydrated.   Eyes:  vision grossly intact and pupils equal.   Ears:  R ear normal and L ear normal.   Nose:  no external deformity.   Mouth:  good dentition  and no gingival abnormalities.   Lungs:  Normal respiratory effort, chest expands symmetrically. Lungs are clear to auscultation, no crackles or wheezes. Heart:  Normal rate and regular rhythm. S1 and S2 normal without gallop, murmur, click, rub or other extra sounds. Extremities:  no edema Neurologic:  alert & oriented X3 and gait normal.   Skin:  Intact without suspicious lesions or rashes Psych:  normally interactive, very pleasant.  Assessment and Plan: 1. HYPERTENSION, BENIGN ESSENTIAL  Deteriorated but has not yet taken her medication. Advised taking it as soon as she gets home and to keep [place] posted with her readings.  2. COPD Stable on Advair.  Not requiring rescue inhaler.  3. RENAL INSUFFICIENCY  Stable, follow with renal.

## 2011-05-28 NOTE — Patient Instructions (Signed)
Good to see you, Bianca [name]. Have a wonderful holiday season.

## 2011-07-13 ENCOUNTER — Other Ambulatory Visit: Payer: Self-pay | Admitting: Family Medicine

## 2011-11-18 ENCOUNTER — Ambulatory Visit (INDEPENDENT_AMBULATORY_CARE_PROVIDER_SITE_OTHER): Payer: Medicare Other | Admitting: Family Medicine

## 2011-11-18 ENCOUNTER — Encounter: Payer: Self-pay | Admitting: Family Medicine

## 2011-11-18 ENCOUNTER — Telehealth: Payer: Self-pay | Admitting: *Deleted

## 2011-11-18 VITALS — BP 138/70 | HR 86 | Temp 97.9°F | Wt 135.0 lb

## 2011-11-18 DIAGNOSIS — J069 Acute upper respiratory infection, unspecified: Secondary | ICD-10-CM

## 2011-11-18 MED ORDER — ALBUTEROL SULFATE HFA 108 (90 BASE) MCG/ACT IN AERS: 2.0000 | INHALATION_SPRAY | RESPIRATORY_TRACT | Status: DC | PRN

## 2011-11-18 MED ORDER — AZITHROMYCIN 250 MG PO TABS: ORAL_TABLET | ORAL | Status: DC

## 2011-11-18 NOTE — Progress Notes (Signed)
71 yo here for:  Over 2 weeks of productive cough and wheezing.    Cough is getting progressively more productive and wheezing is worsening.    She ran out of her advair and albuterol.  No fevers/chills, abd pain, n/v/d, rashes, myalgias.   + h/o asthma, quit smoking 7 years ago No smokers at home.   Patient Active Problem List  Diagnoses  . TOBACCO ABUSE  . DEPRESSION  . HYPERTENSION, BENIGN ESSENTIAL  . ALLERGIC  RHINITIS  . ASTHMA  . CHRONIC KIDNEY DISEASE STAGE III (MODERATE)  . RENAL INSUFFICIENCY  . WHEEZING  . URI  . Adjustment disorder   Past Medical History  Diagnosis Date  . Asthma    Past Surgical History  Procedure Date  . Appendectomy   . Other surgical history     cysts removed from foot  . Other surgical history     cysts removed from breast   History  Substance Use Topics  . Smoking status: Former Smoker    Quit date: 07/20/2004  . Smokeless tobacco: Not on file  . Alcohol Use: Not on file   No family history on file. No Known Allergies Current Outpatient Prescriptions on File Prior to Visit  Medication Sig Dispense Refill  . busPIRone (BUSPAR) 15 MG tablet Take 1 tablet (15 mg total) by mouth 2 (two) times daily.  60 tablet  11  . DIOVAN HCT 80-12.5 MG per tablet TAKE 1 TABLET BY MOUTH DAILY.  30 tablet  12  . fexofenadine (ALLEGRA) 180 MG tablet Take 180 mg by mouth as directed.        . Fluticasone-Salmeterol (ADVAIR DISKUS) 100-50 MCG/DOSE AEPB Inhale 1 puff into the lungs 2 (two) times daily.  60 each  12  . Multiple Vitamin (MULTIVITAMIN) tablet Take 1 tablet by mouth daily.        . valsartan-hydrochlorothiazide (DIOVAN-HCT) 80-12.5 MG per tablet Take 1 tablet by mouth daily.  30 tablet  6   The PMH, PSH, Social History, Family History, Medications, and allergies have been reviewed in [facility], and have been updated if relevant.   Review of Systems       See HPI General:  Denies fever. CVS:  No chest pain Resp:  denies shortness of  breath. GI:  Denies abdominal pain and change in bowel habits.  Physical Exam BP 138/70  Pulse 86  Temp(Src) 97.9 F (36.6 C) (Oral)  Wt 135 lb (61.236 kg)  SpO2 96%  General:  alert, well-developed, well-nourished, and well-hydrated.  NAD VSS Nose:  nares congested bilaterally Mouth:  MMM, no pharyngeal erythema Lungs:  normal respiratory effort, no intercostal retractions, no accessory muscle use, diffuse bilateral exp wheezes Heart:  normal rate, regular rhythm, and no murmur.   Extremities:  no edema either lower leg Psych:  normally interactive, flat affect, and subdued.    Assessment and Plan: 1. URI    New.   With bronchitis. Will treat with Zpack. Advair and proair samples given.

## 2011-11-18 NOTE — Telephone Encounter (Signed)
Chart opened in error

## 2011-11-18 NOTE — Progress Notes (Signed)
Samples given per Dr. Deborra Medina:  advair 11/50, 2 boxes given.  Lot number [id number], exp 11/2012.  Albuterol, one box of 90 mcg's given.  Lot number [id number], exp 10/2012.

## 2011-12-04 ENCOUNTER — Ambulatory Visit (INDEPENDENT_AMBULATORY_CARE_PROVIDER_SITE_OTHER): Payer: Medicare Other | Admitting: Family Medicine

## 2011-12-04 ENCOUNTER — Encounter: Payer: Self-pay | Admitting: Family Medicine

## 2011-12-04 VITALS — BP 150/82 | HR 76 | Temp 97.8°F | Wt 136.0 lb

## 2011-12-04 DIAGNOSIS — J309 Allergic rhinitis, unspecified: Secondary | ICD-10-CM

## 2011-12-04 DIAGNOSIS — I10 Essential (primary) hypertension: Secondary | ICD-10-CM

## 2011-12-04 DIAGNOSIS — Z136 Encounter for screening for cardiovascular disorders: Secondary | ICD-10-CM

## 2011-12-04 DIAGNOSIS — N183 Chronic kidney disease, stage 3 unspecified: Secondary | ICD-10-CM

## 2011-12-04 DIAGNOSIS — F3289 Other specified depressive episodes: Secondary | ICD-10-CM

## 2011-12-04 DIAGNOSIS — F329 Major depressive disorder, single episode, unspecified: Secondary | ICD-10-CM

## 2011-12-04 LAB — LIPID PANEL
Cholesterol: 166 mg/dL (ref 0–200)
HDL: 50 mg/dL (ref >39)
Total CHOL/HDL Ratio: 3.3 Ratio
Triglycerides: 117 mg/dL (ref <150)

## 2011-12-04 LAB — COMPREHENSIVE METABOLIC PANEL
Albumin: 4.2 g/dL (ref 3.5–5.2)
BUN: 22 mg/dL (ref 6–23)
CO2: 31 mEq/L (ref 19–32)
Calcium: 9.8 mg/dL (ref 8.4–10.5)
Chloride: 105 mEq/L (ref 96–112)
Creat: 1.9 mg/dL — ABNORMAL HIGH (ref 0.50–1.10)
Glucose, Bld: 110 mg/dL — ABNORMAL HIGH (ref 70–99)
Potassium: 4.9 mEq/L (ref 3.5–5.3)

## 2011-12-04 NOTE — Patient Instructions (Signed)
Good to see you. We will call you with your lab results.   

## 2011-12-04 NOTE — Progress Notes (Signed)
71 yo female here for follow up BP and COPD.  HTN- BP elevated today but typically has white coat HTN. Took it this am and was 126/82 at home.  Has been on Diovan 80-12.5 mg for years. Denies any HA, blurred vision, SOB or LE edema.   COPD- Using Advair daily and proventil only as needed for wheezing or chest tightness.  Feels she has been better about catching exacerbations early.   CKD- stage III, reviewed Dr. Abel Presto notes.  Had follow up in July 2012.  Cr stable.   Patient Active Problem List  Diagnoses  . TOBACCO ABUSE  . DEPRESSION  . HYPERTENSION, BENIGN ESSENTIAL  . ALLERGIC  RHINITIS  . ASTHMA  . CHRONIC KIDNEY DISEASE STAGE III (MODERATE)  . RENAL INSUFFICIENCY  . WHEEZING  . URI  . Adjustment disorder   Past Medical History  Diagnosis Date  . Asthma    Past Surgical History  Procedure Date  . Appendectomy   . Other surgical history     cysts removed from foot  . Other surgical history     cysts removed from breast   History  Substance Use Topics  . Smoking status: Former Smoker    Quit date: 07/20/2004  . Smokeless tobacco: Not on file  . Alcohol Use: Not on file   No family history on file. No Known Allergies Current Outpatient Prescriptions on File Prior to Visit  Medication Sig Dispense Refill  . albuterol (PROVENTIL HFA) 108 (90 BASE) MCG/ACT inhaler Inhale 2 puffs into the lungs every 4 (four) hours as needed. For wheezing  1 Inhaler  1  . DIOVAN HCT 80-12.5 MG per tablet TAKE 1 TABLET BY MOUTH DAILY.  30 tablet  12  . fexofenadine (ALLEGRA) 180 MG tablet Take 180 mg by mouth as directed.        . Fluticasone-Salmeterol (ADVAIR DISKUS) 100-50 MCG/DOSE AEPB Inhale 1 puff into the lungs 2 (two) times daily.  60 each  12  . Multiple Vitamin (MULTIVITAMIN) tablet Take 1 tablet by mouth daily.         The PMH, PSH, Social History, Family History, Medications, and allergies have been reviewed in [facility], and have been updated if relevant.  Physical  exam:  BP 150/82  Pulse 76  Temp(Src) 97.8 F (36.6 C) (Oral)  Wt 136 lb (61.689 kg)  General: alert, well-developed, well-nourished, and well-hydrated. NAD  Ears: R ear normal and L ear normal.  Nose: External nasal examination shows no deformity or inflammation. Nasal mucosa are pink and moist without lesions or exudates.  Mouth: Oral mucosa and oropharynx without lesions or exudates. Teeth in good repair.  Lungs: normal respiratory effort, no intercostal retractions, no accessory muscle use, and normal breath sounds.  Heart: normal rate, regular rhythm, and no murmur.  Extremities: no edema either lower leg  Psych: normally interactive, flat affect, and subdued.     Assessment and Plan: 1. HYPERTENSION, BENIGN ESSENTIAL  Deteriorated but has white coat HTN. Advised continuing to take it at home and to keep [place] posted with her readings.  2. COPD Stable on Advair.  Not requiring rescue inhaler.  3. RENAL INSUFFICIENCY  Stable, follow with renal. Orders Placed This Encounter  Procedures  . Comprehensive metabolic panel  . Lipid Panel

## 2012-03-24 ENCOUNTER — Telehealth: Payer: Self-pay | Admitting: Family Medicine

## 2012-03-24 NOTE — Telephone Encounter (Signed)
Caller: [name]/Patient; Patient Name: Bianca [name]; PCP: Arnette Norris [facility]); Best Callback Phone Number: [phone]. Patient is requesting an antibiotic.    Patient states she is wheezing and coughing. Onset Tuesday 03/22/12.  She is using her inhaler but not consistant. Afebrile, +coughing productive /unsure of color. NO sore throat no ear pain. No other s/sx.  Last use of inhaler- Tuesday .  Patient is clear in voice and thinks she may need antiobiotic. Advised caller to use Albuterol inhaler now and if continued wheezing (mild) to use again in 20 minutes. Will follow up in 1 hour to check progress since she has not been using inhaler understanding expressed. Called patient back and she states she has now stopped wheezing with using her inhaler per instructions. She states she feels better. Reviewed directions and usage of inhaler every 4 hours.  Advised caller to closley mointor her s/sx and for any worsening , questions, or changes to please call back.  She expressed understanding.  Emergent s/sx ruled out per Asthma-Adult protocol with exception to "had a wheezing episode that has now ended after following provider's instructions'.

## 2012-04-23 ENCOUNTER — Other Ambulatory Visit: Payer: Self-pay | Admitting: Family Medicine

## 2012-06-09 ENCOUNTER — Ambulatory Visit (INDEPENDENT_AMBULATORY_CARE_PROVIDER_SITE_OTHER): Payer: Medicare Other | Admitting: Family Medicine

## 2012-06-09 ENCOUNTER — Encounter: Payer: Self-pay | Admitting: Family Medicine

## 2012-06-09 VITALS — BP 120/70 | HR 72 | Temp 97.9°F | Wt 135.0 lb

## 2012-06-09 DIAGNOSIS — N183 Chronic kidney disease, stage 3 unspecified: Secondary | ICD-10-CM

## 2012-06-09 DIAGNOSIS — J449 Chronic obstructive pulmonary disease, unspecified: Secondary | ICD-10-CM

## 2012-06-09 DIAGNOSIS — J4489 Other specified chronic obstructive pulmonary disease: Secondary | ICD-10-CM

## 2012-06-09 DIAGNOSIS — N259 Disorder resulting from impaired renal tubular function, unspecified: Secondary | ICD-10-CM

## 2012-06-09 DIAGNOSIS — I10 Essential (primary) hypertension: Secondary | ICD-10-CM

## 2012-06-09 MED ORDER — VALSARTAN-HYDROCHLOROTHIAZIDE 80-12.5 MG PO TABS: ORAL_TABLET | ORAL | Status: DC

## 2012-06-09 MED ORDER — AMLODIPINE BESYLATE 5 MG PO TABS: 5.0000 mg | ORAL_TABLET | Freq: Every day | ORAL | Status: DC

## 2012-06-09 NOTE — Progress Notes (Signed)
71 yo female here for follow up BP and COPD.  HTN-  Having episodes of hypotension.  Has been on Diovan 80-12.5 mg for years, Dr. Florene Glen added amlodipine 5 mg daily. BP has been dropping 88/50.  She passed out four times.  Did not hit her head.  Has been cutting her diovan-hctz in half.  Denies any HA, blurred vision, SOB or LE edema.   COPD- Using Advair daily and proventil only as needed for wheezing or chest tightness.  Feels she has been better about catching exacerbations early.   CKD- stage III.Marland Kitchen  Sees Dr. Florene Glen, last saw him in July 2013.   Cr stable.   Lab Results  Component Value Date   CREATININE 1.90* 12/04/2011      Patient Active Problem List  Diagnosis  . TOBACCO ABUSE  . DEPRESSION  . HYPERTENSION, BENIGN ESSENTIAL  . ALLERGIC  RHINITIS  . ASTHMA  . CHRONIC KIDNEY DISEASE STAGE III (MODERATE)  . RENAL INSUFFICIENCY  . WHEEZING  . URI  . Adjustment disorder   Past Medical History  Diagnosis Date  . Asthma    Past Surgical History  Procedure Date  . Appendectomy   . Other surgical history     cysts removed from foot  . Other surgical history     cysts removed from breast   History  Substance Use Topics  . Smoking status: Former Smoker    Quit date: 07/20/2004  . Smokeless tobacco: Not on file  . Alcohol Use: Not on file   No family history on file. No Known Allergies Current Outpatient Prescriptions on File Prior to Visit  Medication Sig Dispense Refill  . ADVAIR DISKUS 100-50 MCG/DOSE AEPB INHALE 1 PUFF INTO THE LUNGS 2 (TWO) TIMES DAILY.  60 each  7  . albuterol (PROVENTIL HFA) 108 (90 BASE) MCG/ACT inhaler Inhale 2 puffs into the lungs every 4 (four) hours as needed. For wheezing  1 Inhaler  1  . DIOVAN HCT 80-12.5 MG per tablet TAKE 1 TABLET BY MOUTH DAILY.  30 tablet  12  . fexofenadine (ALLEGRA) 180 MG tablet Take 180 mg by mouth as directed.        . Multiple Vitamin (MULTIVITAMIN) tablet Take 1 tablet by mouth daily.         The PMH,  PSH, Social History, Family History, Medications, and allergies have been reviewed in [facility], and have been updated if relevant.  Physical exam:  BP 120/70  Pulse 72  Temp 97.9 F (36.6 C)  Wt 135 lb (61.236 kg)  General: alert, well-developed, well-nourished, and well-hydrated. NAD  Ears: R ear normal and L ear normal.  Nose: External nasal examination shows no deformity or inflammation. Nasal mucosa are pink and moist without lesions or exudates.  Mouth: Oral mucosa and oropharynx without lesions or exudates. Teeth in good repair.  Lungs: normal respiratory effort, no intercostal retractions, no accessory muscle use, and normal breath sounds.  Heart: normal rate, regular rhythm, and no murmur.  Extremities: no edema either lower leg  Psych: normally interactive, flat affect, and subdued.     Assessment and Plan: 1. HYPERTENSION, BENIGN ESSENTIAL  Now with hypotension. Will d/c amlodipine and restart full tablet of diovan- I prefer not to break combo pill in half as not quite as reliable as to what she is getting with every dose when done this way. The patient indicates understanding of these issues and agrees with the plan.   2. COPD Stable on Advair.  Not requiring rescue inhaler.  3. RENAL INSUFFICIENCY  Stable, follow with renal.

## 2012-06-09 NOTE — Patient Instructions (Addendum)
Good to see you.  Let's stop the amlopidine.  If you still have low blood pressure, call me.

## 2012-06-10 ENCOUNTER — Telehealth: Payer: Self-pay | Admitting: *Deleted

## 2012-06-10 NOTE — Telephone Encounter (Signed)
Advised patient as instructed. 

## 2012-06-10 NOTE — Telephone Encounter (Signed)
Pt is asking if you think she needs to continue to see Dr. Florene Glen, since her creatinine was stable at 1.9 last time.  Also, her BP today is 135/72.  She has not had any diovan since yesterday, when she took one half of a pill.  She's asking if she should take one today, or leave off.  Please advise.

## 2012-06-10 NOTE — Telephone Encounter (Signed)
I would continue the diovan, stop the amlodipine as we discussed.  If she feels it dropping low like it was, let us know. Yes, I would keep seeing Dr. Florene Glen.

## 2012-08-10 ENCOUNTER — Other Ambulatory Visit: Payer: Self-pay | Admitting: Family Medicine

## 2012-11-28 ENCOUNTER — Ambulatory Visit (INDEPENDENT_AMBULATORY_CARE_PROVIDER_SITE_OTHER): Payer: Medicare Other | Admitting: Family Medicine

## 2012-11-28 ENCOUNTER — Encounter: Payer: Self-pay | Admitting: Family Medicine

## 2012-11-28 VITALS — BP 130/82 | HR 72 | Temp 97.8°F | Wt 131.0 lb

## 2012-11-28 DIAGNOSIS — N183 Chronic kidney disease, stage 3 unspecified: Secondary | ICD-10-CM

## 2012-11-28 DIAGNOSIS — J449 Chronic obstructive pulmonary disease, unspecified: Secondary | ICD-10-CM

## 2012-11-28 DIAGNOSIS — I10 Essential (primary) hypertension: Secondary | ICD-10-CM

## 2012-11-28 DIAGNOSIS — Z0000 Encounter for general adult medical examination without abnormal findings: Secondary | ICD-10-CM

## 2012-11-28 DIAGNOSIS — Z136 Encounter for screening for cardiovascular disorders: Secondary | ICD-10-CM

## 2012-11-28 LAB — COMPREHENSIVE METABOLIC PANEL
Alkaline Phosphatase: 50 U/L (ref 39–117)
BUN: 21 mg/dL (ref 6–23)
CO2: 27 mEq/L (ref 19–32)
GFR: 34 mL/min — ABNORMAL LOW (ref >60.00)
Glucose, Bld: 90 mg/dL (ref 70–99)
Sodium: 138 mEq/L (ref 135–145)
Total Bilirubin: 0.6 mg/dL (ref 0.3–1.2)
Total Protein: 6.5 g/dL (ref 6.0–8.3)

## 2012-11-28 LAB — LIPID PANEL
Cholesterol: 160 mg/dL (ref 0–200)
LDL Cholesterol: 91 mg/dL (ref 0–99)
Triglycerides: 82 mg/dL (ref 0.0–149.0)
VLDL: 16.4 mg/dL (ref 0.0–40.0)

## 2012-11-28 LAB — CBC WITH DIFFERENTIAL/PLATELET
Basophils Absolute: 0.3 10*3/uL — ABNORMAL HIGH (ref 0.0–0.1)
Eosinophils Absolute: 0.1 10*3/uL (ref 0.0–0.7)
HCT: 36.2 % (ref 36.0–46.0)
Hemoglobin: 12.4 g/dL (ref 12.0–15.0)
Lymphs Abs: 3.2 10*3/uL (ref 0.7–4.0)
MCHC: 34.3 g/dL (ref 30.0–36.0)
Monocytes Absolute: 0.7 10*3/uL (ref 0.1–1.0)
Monocytes Relative: 8.4 % (ref 3.0–12.0)
Neutro Abs: 3.7 10*3/uL (ref 1.4–7.7)
Platelets: 272 10*3/uL (ref 150.0–400.0)
RDW: 13.8 % (ref 11.5–14.6)

## 2012-11-28 NOTE — Progress Notes (Signed)
72 yo female here for follow up BP and COPD.  HTN-  Having episodes of hypotension. On Diovan HCTZ.  Stopped amlodipine in 05/2012 due to hypotension with syncope.    Denies any HA, blurred vision, SOB or LE edema.  Has had no further episodes of syncope.   COPD- Using Advair daily and proventil only as needed for wheezing or chest tightness.  Feels she has been better about catching exacerbations early and actually has not had a bad spring allergy season.   CKD- stage III.Marland Kitchen  Sees Dr. Florene Glen, last saw him in July 2013.   Cr stable.   Lab Results  Component Value Date   CREATININE 1.90* 12/04/2011      Patient Active Problem List   Diagnosis Date Noted  . COPD (chronic obstructive pulmonary disease) 06/09/2012  . Adjustment disorder 11/25/2010  . URI 09/25/2010  . CHRONIC KIDNEY DISEASE STAGE III (MODERATE) 11/13/2009  . RENAL INSUFFICIENCY 07/17/2009  . WHEEZING 12/13/2007  . DEPRESSION 05/04/2007  . TOBACCO ABUSE 05/03/2007  . ASTHMA 05/03/2007  . HYPERTENSION, BENIGN ESSENTIAL 12/06/2006  . ALLERGIC  RHINITIS 12/06/2006   Past Medical History  Diagnosis Date  . Asthma    Past Surgical History  Procedure Laterality Date  . Appendectomy    . Other surgical history      cysts removed from foot  . Other surgical history      cysts removed from breast   History  Substance Use Topics  . Smoking status: Former Smoker    Quit date: 07/20/2004  . Smokeless tobacco: Not on file  . Alcohol Use: Not on file   No family history on file. No Known Allergies Current Outpatient Prescriptions on File Prior to Visit  Medication Sig Dispense Refill  . ADVAIR DISKUS 100-50 MCG/DOSE AEPB INHALE 1 PUFF INTO THE LUNGS 2 (TWO) TIMES DAILY.  60 each  7  . albuterol (PROVENTIL HFA) 108 (90 BASE) MCG/ACT inhaler Inhale 2 puffs into the lungs every 4 (four) hours as needed. For wheezing  1 Inhaler  1  . DIOVAN HCT 80-12.5 MG per tablet TAKE 1 TABLET BY MOUTH DAILY.  30 tablet  5  .  fexofenadine (ALLEGRA) 180 MG tablet Take 180 mg by mouth as directed.        . Multiple Vitamin (MULTIVITAMIN) tablet Take 1 tablet by mouth daily.        . valsartan-hydrochlorothiazide (DIOVAN HCT) 80-12.5 MG per tablet TAKE 1 TABLET BY MOUTH DAILY.  30 tablet  12   No current facility-administered medications on file prior to visit.   The PMH, PSH, Social History, Family History, Medications, and allergies have been reviewed in [facility], and have been updated if relevant.  Physical exam: BP 140/82  Pulse 72  Temp(Src) 97.8 F (36.6 C)  Wt 131 lb (59.421 kg)  BMI 21.15 kg/m2  General: alert, well-developed, well-nourished, and well-hydrated. NAD  Ears: R ear normal and L ear normal.  Nose: External nasal examination shows no deformity or inflammation. Nasal mucosa are pink and moist without lesions or exudates.  Mouth: Oral mucosa and oropharynx without lesions or exudates. Teeth in good repair.  Lungs: normal respiratory effort, no intercostal retractions, no accessory muscle use, and normal breath sounds.  Heart: normal rate, regular rhythm, and no murmur.  Extremities: no edema either lower leg  Psych: normally interactive, pleasant.  Assessment and Plan:  1. COPD (chronic obstructive pulmonary disease)  Well controlled on Advair and prn rescue inhaler.  No changes.  2. CHRONIC KIDNEY DISEASE STAGE III (MODERATE)  Recheck Cr today.  Also followed by renal.  3. HYPERTENSION, BENIGN ESSENTIAL  Stable without any further issues of hypotension.  4. Screening for ischemic heart disease  - Lipid Panel

## 2012-11-28 NOTE — Patient Instructions (Addendum)
Great to see you. We will call you with your lab results.  Say hi to Mr. Dopson for me.

## 2012-12-28 ENCOUNTER — Other Ambulatory Visit: Payer: Self-pay | Admitting: Family Medicine

## 2013-01-18 ENCOUNTER — Ambulatory Visit: Payer: Medicare Other | Admitting: Family Medicine

## 2013-01-19 ENCOUNTER — Encounter: Payer: Self-pay | Admitting: Family Medicine

## 2013-01-19 ENCOUNTER — Ambulatory Visit (INDEPENDENT_AMBULATORY_CARE_PROVIDER_SITE_OTHER): Payer: Medicare Other | Admitting: Family Medicine

## 2013-01-19 VITALS — BP 142/82 | HR 92 | Temp 97.8°F | Wt 128.0 lb

## 2013-01-19 DIAGNOSIS — J449 Chronic obstructive pulmonary disease, unspecified: Secondary | ICD-10-CM

## 2013-01-19 MED ORDER — BUSPIRONE HCL 15 MG PO TABS: 15.0000 mg | ORAL_TABLET | Freq: Two times a day (BID) | ORAL | Status: DC

## 2013-01-19 MED ORDER — IPRATROPIUM BROMIDE 0.02 % IN SOLN
0.5000 mg | Freq: Once | RESPIRATORY_TRACT | Status: AC
  Administered 2013-01-19: 0.5 mg via RESPIRATORY_TRACT

## 2013-01-19 MED ORDER — ALBUTEROL SULFATE HFA 108 (90 BASE) MCG/ACT IN AERS: INHALATION_SPRAY | RESPIRATORY_TRACT | Status: DC

## 2013-01-19 MED ORDER — AZITHROMYCIN 250 MG PO TABS: ORAL_TABLET | ORAL | Status: DC

## 2013-01-19 MED ORDER — ALBUTEROL SULFATE (2.5 MG/3ML) 0.083% IN NEBU
2.5000 mg | INHALATION_SOLUTION | Freq: Once | RESPIRATORY_TRACT | Status: AC
  Administered 2013-01-19: 2.5 mg via RESPIRATORY_TRACT

## 2013-01-19 NOTE — Addendum Note (Signed)
Addended by: Lucille Passy on: 01/19/2013 10:54 AM   Modules accepted: Level of Service

## 2013-01-19 NOTE — Patient Instructions (Addendum)
Great to see you. Take Zpack as directed. Continue using your inhalers.  Please call me next week with update if no improvement. Go to ER if symptoms worsen over weekend.

## 2013-01-19 NOTE — Addendum Note (Signed)
Addended by: Ricki Miller on: 01/19/2013 03:44 PM   Modules accepted: Orders

## 2013-01-19 NOTE — Progress Notes (Signed)
72 yo here for:  Over 5 weeks of productive cough and wheezing.    Cough is getting progressively more productive and wheezing is worsening.    She is using her advair and her albuterol.  No fevers/chills, abd pain, n/v/d, rashes, myalgias.  + h/o asthma, quit smoking almost 10 years ago. No smokers at home.   Patient Active Problem List   Diagnosis Date Noted  . COPD (chronic obstructive pulmonary disease) 06/09/2012  . Adjustment disorder 11/25/2010  . CHRONIC KIDNEY DISEASE STAGE III (MODERATE) 11/13/2009  . WHEEZING 12/13/2007  . DEPRESSION 05/04/2007  . TOBACCO ABUSE 05/03/2007  . ASTHMA 05/03/2007  . HYPERTENSION, BENIGN ESSENTIAL 12/06/2006  . ALLERGIC  RHINITIS 12/06/2006   Past Medical History  Diagnosis Date  . Asthma    Past Surgical History  Procedure Laterality Date  . Appendectomy    . Other surgical history      cysts removed from foot  . Other surgical history      cysts removed from breast   History  Substance Use Topics  . Smoking status: Former Smoker    Quit date: 07/20/2004  . Smokeless tobacco: Not on file  . Alcohol Use: Not on file   No family history on file. No Known Allergies Current Outpatient Prescriptions on File Prior to Visit  Medication Sig Dispense Refill  . ADVAIR DISKUS 100-50 MCG/DOSE AEPB INHALE 1 PUFF INTO THE LUNGS 2 (TWO) TIMES DAILY.  60 each  5  . fexofenadine (ALLEGRA) 180 MG tablet Take 180 mg by mouth as directed.        . Multiple Vitamin (MULTIVITAMIN) tablet Take 1 tablet by mouth daily.        Marland Kitchen PROAIR HFA 108 (90 BASE) MCG/ACT inhaler INHALE 2 PUFFS EVERY 4 HOURS AS NEEDED  8.5 each  5  . valsartan-hydrochlorothiazide (DIOVAN-HCT) 80-12.5 MG per tablet Take 1 tablet by mouth daily.       No current facility-administered medications on file prior to visit.   The PMH, PSH, Social History, Family History, Medications, and allergies have been reviewed in [facility], and have been updated if relevant.   Review of  Systems       See HPI General:  Denies fever. CVS:  No chest pain Resp:  denies shortness of breath. GI:  Denies abdominal pain and change in bowel habits.  Physical Exam BP 172/92  Pulse 92  Temp(Src) 97.8 F (36.6 C)  Wt 128 lb (58.06 kg)  BMI 20.67 kg/m2  SpO2 96%  General:  alert, well-developed, well-nourished, and well-hydrated.  NAD VSS Nose:  nares congested bilaterally Mouth:  MMM, no pharyngeal erythema Lungs:  normal respiratory effort, no intercostal retractions, no accessory muscle use, diffuse bilateral exp wheezes improved after nebs Heart:  normal rate, regular rhythm, and no murmur.   Extremities:  no edema either lower leg Psych:  normally interactive, flat affect, and subdued.    Assessment and Plan: COPD (chronic obstructive pulmonary disease) New.   With bronchitis. Will treat with Zpack. Advair and proair samples given. Nebs given in office.

## 2013-01-31 ENCOUNTER — Ambulatory Visit (INDEPENDENT_AMBULATORY_CARE_PROVIDER_SITE_OTHER): Payer: Medicare Other | Admitting: Family Medicine

## 2013-01-31 ENCOUNTER — Encounter: Payer: Self-pay | Admitting: Family Medicine

## 2013-01-31 VITALS — BP 156/88 | HR 94 | Temp 97.9°F | Ht 65.0 in | Wt 127.8 lb

## 2013-01-31 DIAGNOSIS — R300 Dysuria: Secondary | ICD-10-CM

## 2013-01-31 DIAGNOSIS — N390 Urinary tract infection, site not specified: Secondary | ICD-10-CM

## 2013-01-31 LAB — POCT URINALYSIS DIPSTICK
Bilirubin, UA: NEGATIVE
Nitrite, UA: POSITIVE
Urobilinogen, UA: 0.2
pH, UA: 7

## 2013-01-31 LAB — POCT UA - MICROSCOPIC ONLY
Casts, Ur, LPF, POC: 0
Crystals, Ur, HPF, POC: 0
Yeast, UA: 0

## 2013-01-31 MED ORDER — CEPHALEXIN 250 MG PO CAPS: 250.0000 mg | ORAL_CAPSULE | Freq: Two times a day (BID) | ORAL | Status: DC

## 2013-01-31 NOTE — Patient Instructions (Signed)
Drink lots of water Take the keflex twice daily for a week  Update if not starting to improve in several  or if worsening  (or if fever or back pain) We will call you with a urine culture result when it returns

## 2013-01-31 NOTE — Progress Notes (Signed)
Subjective:    Patient ID: Bianca [name], female    DOB: 1940/12/20, 72 y.o.   MRN: [id number]  HPI Here for uti ua is abn with leuk and rbc She has burning to urinate and also bladder pressure  Symptoms started Friday and getting worse  No blood in urine but it looks cloudy  No n/v/fever  Has frequency and urgency  No incontinence   This is first uti she has had in years   Problem list- CKD   Chemistry      Component Value Date/Time   NA 138 11/28/2012 1202   K 3.8 11/28/2012 1202   CL 102 11/28/2012 1202   CO2 27 11/28/2012 1202   BUN 21 11/28/2012 1202   CREATININE 1.6* 11/28/2012 1202   CREATININE 1.90* 12/04/2011 1609      Component Value Date/Time   CALCIUM 9.1 11/28/2012 1202   ALKPHOS 50 11/28/2012 1202   AST 23 11/28/2012 1202   ALT 15 11/28/2012 1202   BILITOT 0.6 11/28/2012 1202       Patient Active Problem List   Diagnosis Date Noted  . UTI (urinary tract infection) 01/31/2013  . COPD (chronic obstructive pulmonary disease) 06/09/2012  . Adjustment disorder 11/25/2010  . CHRONIC KIDNEY DISEASE STAGE III (MODERATE) 11/13/2009  . WHEEZING 12/13/2007  . DEPRESSION 05/04/2007  . TOBACCO ABUSE 05/03/2007  . ASTHMA 05/03/2007  . HYPERTENSION, BENIGN ESSENTIAL 12/06/2006  . ALLERGIC  RHINITIS 12/06/2006   Past Medical History  Diagnosis Date  . Asthma    Past Surgical History  Procedure Laterality Date  . Appendectomy    . Other surgical history      cysts removed from foot  . Other surgical history      cysts removed from breast   History  Substance Use Topics  . Smoking status: Former Smoker    Quit date: 07/20/2004  . Smokeless tobacco: Not on file  . Alcohol Use: No   No family history on file. No Known Allergies Current Outpatient Prescriptions on File Prior to Visit  Medication Sig Dispense Refill  . ADVAIR DISKUS 100-50 MCG/DOSE AEPB INHALE 1 PUFF INTO THE LUNGS 2 (TWO) TIMES DAILY.  60 each  5  . albuterol (PROAIR HFA) 108 (90 BASE)  MCG/ACT inhaler INHALE 2 PUFFS EVERY 4 HOURS AS NEEDED  8.5 each  5  . busPIRone (BUSPAR) 15 MG tablet Take 1 tablet (15 mg total) by mouth 2 (two) times daily.  60 tablet  11  . fexofenadine (ALLEGRA) 180 MG tablet Take 180 mg by mouth as directed.        . Multiple Vitamin (MULTIVITAMIN) tablet Take 1 tablet by mouth daily.        . valsartan-hydrochlorothiazide (DIOVAN-HCT) 80-12.5 MG per tablet Take 1 tablet by mouth daily.       No current facility-administered medications on file prior to visit.    Review of Systems    Review of Systems  Constitutional: Negative for fever, appetite change, fatigue and unexpected weight change.  Eyes: Negative for pain and visual disturbance.  Respiratory: Negative for cough and shortness of breath.   Cardiovascular: Negative for cp or palpitations    Gastrointestinal: Negative for nausea, diarrhea and constipation.  Genitourinary: pos for urgency and frequency. neg for flank pain or blood in urine Skin: Negative for pallor or rash   Neurological: Negative for weakness, light-headedness, numbness and headaches.  Hematological: Negative for adenopathy. Does not bruise/bleed easily.  Psychiatric/Behavioral: Negative for dysphoric mood.  The patient is not nervous/anxious.      Objective:   Physical Exam  Constitutional: She appears well-developed and well-nourished. No distress.  HENT:  Head: Normocephalic and atraumatic.  Mouth/Throat: Oropharynx is clear and moist.  Hard of hearing  Eyes: Conjunctivae and EOM are normal. Pupils are equal, round, and reactive to light.  Neck: Normal range of motion. Neck supple.  Cardiovascular: Normal rate and regular rhythm.   Pulmonary/Chest: Effort normal and breath sounds normal.  Diffusely distant bs   Abdominal: Soft. Bowel sounds are normal. She exhibits no distension and no mass. There is tenderness in the suprapubic area. There is no rebound, no guarding and no CVA tenderness.  Mild suprapubic  tenderness  Neurological: She is alert.  Skin: Skin is warm and dry. No rash noted.  Psychiatric: She has a normal mood and affect.          Assessment & Plan:

## 2013-01-31 NOTE — Assessment & Plan Note (Signed)
In pt with CKD  tx with low dose keflex bid one week and cx urine inst to increase water intake for now  Update if not starting to improve in several days  or if worsening

## 2013-02-02 LAB — URINE CULTURE: Colony Count: NO GROWTH

## 2013-02-16 ENCOUNTER — Encounter: Payer: Self-pay | Admitting: Family Medicine

## 2013-02-26 ENCOUNTER — Other Ambulatory Visit: Payer: Self-pay | Admitting: Family Medicine

## 2013-03-02 ENCOUNTER — Other Ambulatory Visit: Payer: Self-pay | Admitting: Radiology

## 2013-03-03 ENCOUNTER — Other Ambulatory Visit: Payer: Self-pay | Admitting: Radiology

## 2013-03-03 DIAGNOSIS — R922 Inconclusive mammogram: Secondary | ICD-10-CM

## 2013-03-06 ENCOUNTER — Other Ambulatory Visit: Payer: Medicare Other

## 2013-03-06 ENCOUNTER — Telehealth: Payer: Self-pay | Admitting: *Deleted

## 2013-03-06 ENCOUNTER — Ambulatory Visit
Admission: RE | Admit: 2013-03-06 | Discharge: 2013-03-06 | Disposition: A | Discharge status: Home / Self Care | Payer: Medicare Other | Source: Ambulatory Visit | Attending: Radiology | Admitting: Radiology

## 2013-03-06 DIAGNOSIS — C50412 Malignant neoplasm of upper-outer quadrant of left female breast: Secondary | ICD-10-CM | POA: Insufficient documentation

## 2013-03-06 DIAGNOSIS — R922 Inconclusive mammogram: Secondary | ICD-10-CM

## 2013-03-06 MED ORDER — GADOBENATE DIMEGLUMINE 529 MG/ML IV SOLN
5.0000 mL | Freq: Once | INTRAVENOUS | Status: AC | PRN
  Administered 2013-03-06: 5 mL via INTRAVENOUS

## 2013-03-06 NOTE — Telephone Encounter (Signed)
Called and spoke with patient and confirmed [place] appt for 03/08/13 at 12N.  Instructions and contact information given.

## 2013-03-07 ENCOUNTER — Other Ambulatory Visit: Payer: Self-pay | Admitting: Radiology

## 2013-03-07 DIAGNOSIS — R928 Other abnormal and inconclusive findings on diagnostic imaging of breast: Secondary | ICD-10-CM

## 2013-03-08 ENCOUNTER — Other Ambulatory Visit (HOSPITAL_BASED_OUTPATIENT_CLINIC_OR_DEPARTMENT_OTHER): Payer: Medicare Other

## 2013-03-08 ENCOUNTER — Ambulatory Visit (HOSPITAL_BASED_OUTPATIENT_CLINIC_OR_DEPARTMENT_OTHER): Payer: Medicare Other

## 2013-03-08 ENCOUNTER — Encounter: Payer: Self-pay | Admitting: *Deleted

## 2013-03-08 ENCOUNTER — Ambulatory Visit (HOSPITAL_BASED_OUTPATIENT_CLINIC_OR_DEPARTMENT_OTHER): Payer: Medicare Other | Admitting: Oncology

## 2013-03-08 ENCOUNTER — Encounter (INDEPENDENT_AMBULATORY_CARE_PROVIDER_SITE_OTHER): Payer: Self-pay | Admitting: Surgery

## 2013-03-08 ENCOUNTER — Ambulatory Visit: Payer: Medicare Other | Attending: Surgery | Admitting: Physical Therapy

## 2013-03-08 ENCOUNTER — Ambulatory Visit (HOSPITAL_BASED_OUTPATIENT_CLINIC_OR_DEPARTMENT_OTHER): Payer: Medicare Other | Admitting: Surgery

## 2013-03-08 ENCOUNTER — Encounter: Payer: Self-pay | Admitting: Oncology

## 2013-03-08 ENCOUNTER — Ambulatory Visit
Admission: RE | Admit: 2013-03-08 | Discharge: 2013-03-08 | Disposition: A | Discharge status: Home / Self Care | Payer: Medicare Other | Source: Ambulatory Visit | Attending: Radiation Oncology | Admitting: Radiation Oncology

## 2013-03-08 VITALS — BP 134/83 | HR 105 | Temp 98.2°F | Resp 19 | Ht 65.0 in | Wt 127.2 lb

## 2013-03-08 DIAGNOSIS — D0590 Unspecified type of carcinoma in situ of unspecified breast: Secondary | ICD-10-CM

## 2013-03-08 DIAGNOSIS — C50919 Malignant neoplasm of unspecified site of unspecified female breast: Secondary | ICD-10-CM | POA: Insufficient documentation

## 2013-03-08 DIAGNOSIS — C50412 Malignant neoplasm of upper-outer quadrant of left female breast: Secondary | ICD-10-CM

## 2013-03-08 DIAGNOSIS — D0512 Intraductal carcinoma in situ of left breast: Secondary | ICD-10-CM

## 2013-03-08 DIAGNOSIS — IMO0001 Reserved for inherently not codable concepts without codable children: Secondary | ICD-10-CM | POA: Insufficient documentation

## 2013-03-08 DIAGNOSIS — Z171 Estrogen receptor negative status [ER-]: Secondary | ICD-10-CM

## 2013-03-08 LAB — CBC WITH DIFFERENTIAL/PLATELET
BASO%: 0.7 % (ref 0.0–2.0)
EOS%: 0.2 % (ref 0.0–7.0)
MCH: 29.9 pg (ref 25.1–34.0)
MCHC: 33.8 g/dL (ref 31.5–36.0)
NEUT%: 72 % (ref 38.4–76.8)
RBC: 4.19 10*6/uL (ref 3.70–5.45)
RDW: 14 % (ref 11.2–14.5)
lymph#: 1.6 10*3/uL (ref 0.9–3.3)

## 2013-03-08 LAB — COMPREHENSIVE METABOLIC PANEL (CC13)
ALT: 16 U/L (ref 0–55)
AST: 19 U/L (ref 5–34)
Calcium: 9.5 mg/dL (ref 8.4–10.4)
Chloride: 102 mEq/L (ref 98–109)
Creatinine: 1.5 mg/dL — ABNORMAL HIGH (ref 0.6–1.1)

## 2013-03-08 NOTE — Progress Notes (Signed)
Checked in new pt with no financial concerns. °

## 2013-03-08 NOTE — Progress Notes (Signed)
Patient ID: Bianca [name], female   DOB: [birth year], 72 y.o.   MRN: [id number]  No chief complaint on file.   HPI Bianca [name] is a 72 y.o. female.   HPI  She is referred by Jiles Crocker for evaluation of recent calcifications found on mammography the left breast. She had a stereotactic biopsy showing ductal carcinoma in situ. She has had previous breast cysts removed but has never needed surgical excision of a malignancy. She denies nipple discharge. She has no other complaints. Past Medical History  Diagnosis Date  . Asthma   . Hypertension   . Anxiety     Past Surgical History  Procedure Laterality Date  . Appendectomy    . Other surgical history      cysts removed from foot  . Other surgical history      cysts removed from breast    History reviewed. No pertinent family history.  Social History History  Substance Use Topics  . Smoking status: Former Smoker -- 0.50 packs/day    Types: Cigarettes    Quit date: 07/20/2004  . Smokeless tobacco: Not on file  . Alcohol Use: No    No Known Allergies  Current Outpatient Prescriptions  Medication Sig Dispense Refill  . ADVAIR DISKUS 100-50 MCG/DOSE AEPB INHALE 1 PUFF INTO THE LUNGS 2 (TWO) TIMES DAILY.  60 each  5  . albuterol (PROAIR HFA) 108 (90 BASE) MCG/ACT inhaler INHALE 2 PUFFS EVERY 4 HOURS AS NEEDED  8.5 each  5  . busPIRone (BUSPAR) 15 MG tablet Take 1 tablet (15 mg total) by mouth 2 (two) times daily.  60 tablet  11  . cephALEXin (KEFLEX) 250 MG capsule Take 1 capsule (250 mg total) by mouth 2 (two) times daily.  14 capsule  0  . fexofenadine (ALLEGRA) 180 MG tablet Take 180 mg by mouth as directed.        . Multiple Vitamin (MULTIVITAMIN) tablet Take 1 tablet by mouth daily.        . valsartan-hydrochlorothiazide (DIOVAN-HCT) 80-12.5 MG per tablet Take 1 tablet by mouth daily.      . valsartan-hydrochlorothiazide (DIOVAN-HCT) 80-12.5 MG per tablet TAKE 1 TABLET BY MOUTH DAILY.  30 tablet  5   No  current facility-administered medications for this visit.    Review of Systems Review of Systems  Constitutional: Negative for fever, chills and unexpected weight change.  HENT: Negative for hearing loss, congestion, sore throat, trouble swallowing and voice change.   Eyes: Negative for visual disturbance.  Respiratory: Negative for cough and wheezing.   Cardiovascular: Negative for chest pain, palpitations and leg swelling.  Gastrointestinal: Negative for nausea, vomiting, abdominal pain, diarrhea, constipation, blood in stool, abdominal distention and anal bleeding.  Genitourinary: Negative for hematuria, vaginal bleeding and difficulty urinating.  Musculoskeletal: Negative for arthralgias.  Skin: Negative for rash and wound.  Neurological: Negative for seizures, syncope and headaches.  Hematological: Negative for adenopathy. Does not bruise/bleed easily.  Psychiatric/Behavioral: Negative for confusion.    There were no vitals taken for this visit.  Physical Exam Physical Exam  Constitutional: She is oriented to person, place, and time. She appears well-developed and well-nourished. No distress.  HENT:  Head: Normocephalic and atraumatic.  Right Ear: External ear normal.  Left Ear: External ear normal.  Nose: Nose normal.  Mouth/Throat: Oropharynx is clear and moist. No oropharyngeal exudate.  Eyes: Conjunctivae are normal. Pupils are equal, round, and reactive to light. Right eye exhibits no discharge. Left eye  exhibits no discharge. No scleral icterus.  Neck: Normal range of motion. Neck supple. No tracheal deviation present. No thyromegaly present.  Cardiovascular: Normal rate, regular rhythm, normal heart sounds and intact distal pulses.   No murmur heard. Pulmonary/Chest: Effort normal and breath sounds normal. No respiratory distress. She has no wheezes. She has no rales.  Abdominal: Soft. Bowel sounds are normal. She exhibits no distension. There is no tenderness.   Musculoskeletal: Normal range of motion. She exhibits no edema and no tenderness.  Lymphadenopathy:    She has no cervical adenopathy.    She has no axillary adenopathy.  Neurological: She is alert and oriented to person, place, and time.  Skin: Skin is warm and dry. No rash noted. She is not diaphoretic. No erythema.  Psychiatric: Her behavior is normal. Judgment normal.  Breast: no palpable masses in either breast  Data Reviewed Mammograms, MRI, and biopsy results have been reviewed. The pathology shows ductal carcinoma in situ which is ER and PR negative. The MRI shows the extent of disease to be 6.5 cm in size. There is a suspicious area in the right breast. 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 To remove this area would create too large of a cosmetic defect. Given the rapidity of the development of this area, even if a biopsy of the furthest extent was negative, I am still uncertain whether we would be leaving disease behind or not. I would also recommend sentinel lymph node biopsy at the time of the mastectomy. She is to think about this information. I cannot plan surgery until after her biopsy of the other breast on August 27. Should she decide to proceed with mastectomy, I will again discuss possibility of reconstruction with her which I did discuss today.        Kori Colin A 03/08/2013, 1:52 PM

## 2013-03-09 ENCOUNTER — Encounter: Payer: Self-pay | Admitting: Specialist

## 2013-03-09 NOTE — Progress Notes (Signed)
ID: Adron [name] OB: 74-Feb-1942  MR#: [id number]  [id number]  PCP: Arnette Norris, MD GYN:  Gus Height SU: Harl Bowie OTHER MD: Kyung Rudd   HISTORY OF PRESENT ILLNESS: Bianca [name] underwent routine mammographic screening at [facility] 02/22/2013 showing a possible abnormality in the left breast. Additional views age 72/20/2014 confirmed a new calcifications in the upper outer quadrant of the left breast extending over an area of 5.2 cm. Biopsy of this area 03/02/2013 showed (SAA [id number]) ductal carcinoma in situ, grade 2 or 3, estrogen and progesterone receptor negative.  Bilateral breast MRI 03/06/2013 showed the area of non-masslike enhancement in the left breast to measure total of 6.5 cm extending over 2 quadrants. There were no other areas of concern in the left breast and there was no suspicious adenopathy noted. In the right breast, an 8 mm enhancing mass was felt to be suspicious enough to warrant biopsy.  The patient's subsequent history is as detailed below  INTERVAL HISTORY: Bianca [name] was evaluated in the multidisciplinary breast cancer clinic 03/08/2013 accompanied by her husband Bianca [name] and her daughter's Bianca [name] and Bianca [name]  REVIEW OF SYSTEMS: The patient reports no new or unusual symptoms leading to her screening mammography, which was routine. She is concerned that she is gaining weight. She feels anxious about the new diagnosis. Otherwise a detailed review of systems today was entirely non-contributory  PAST MEDICAL HISTORY: Past Medical History  Diagnosis Date  . Asthma   . Hypertension   . Anxiety     PAST SURGICAL HISTORY: Past Surgical History  Procedure Laterality Date  . Appendectomy    . Other surgical history      cysts removed from foot  . Other surgical history      cysts removed from breast    FAMILY HISTORY No family history on file. The patient's father was diagnosed with prostate cancer the age of 86 and died 3 years later. The patient's mother  died from cancer of the vulva at age 46. The patient had 2 brothers and 2 sisters. One of these 2 sisters was diagnosed with breast cancer at the age of 34. There is no history of ovarian cancer in the family.  Menarche age 19, first live birth age 77, the patient is Bianca [name] P2. She underwent menopause approximately age 65. 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 Her husband Bianca [name] he used to work for [employer]. Both of them are now retired. Daughter Bianca [name] lives in [place] and works for [employer] of a home care agency. Daughter Bianca [name] lives in [place] and is a stay-at-home mom. The patient has a 41 year old grandson and a 56 year old granddaughter. She attends [employer].     ADVANCED DIRECTIVES: Not in place  HEALTH MAINTENANCE: History  Substance Use Topics  . Smoking status: Former Smoker -- 0.50 packs/day    Types: Cigarettes    Quit date: 07/20/2004  . Smokeless tobacco: Not on file  . Alcohol Use: No   the patient continues to smoke EC are its.   Colonoscopy:  PAP:  Bone density: [id number], at [facility], showing osteoporosis with a T score of -3.5  Lipid panel:  No Known Allergies  Current Outpatient Prescriptions  Medication Sig Dispense Refill  . ADVAIR DISKUS 100-50 MCG/DOSE AEPB INHALE 1 PUFF INTO THE LUNGS 2 (TWO) TIMES DAILY.  60 each  5  . albuterol (PROAIR HFA) 108 (  90 BASE) MCG/ACT inhaler INHALE 2 PUFFS EVERY 4 HOURS AS NEEDED  8.5 each  5  . CALCIUM PO Take by mouth.      . Cholecalciferol (VITAMIN D-3 PO) Take by mouth.      . fexofenadine (ALLEGRA) 180 MG tablet Take 180 mg by mouth as directed.        . Omega-3 Fatty Acids (OMEGA 3 PO) Take by mouth.      . valsartan-hydrochlorothiazide (DIOVAN-HCT) 80-12.5 MG per tablet TAKE 1 TABLET BY MOUTH DAILY.  30 tablet  5  . busPIRone (BUSPAR) 15 MG tablet Take 1 tablet (15 mg total) by mouth 2 (two) times daily.  60 tablet   11  . Multiple Vitamin (MULTIVITAMIN) tablet Take 1 tablet by mouth daily.         No current facility-administered medications for this visit.    OBJECTIVE: Older white woman who appears stated age 72 Vitals:   03/08/13 1225  BP: 134/83  Pulse: 105  Temp: 98.2 F (36.8 C)  Resp: 19     Body mass index is 21.17 kg/(m^2).    ECOG FS:0 - Asymptomatic  Sclerae unicteric Oropharynx clear No cervical or supraclavicular adenopathy Lungs no rales or rhonchi, fair excursion bilaterally Heart regular rate and rhythm Abd benign MSK no focal spinal tenderness, no peripheral edema Neuro: non-focal, well-oriented, appropriate affect Breasts: The left breast is status post recent biopsy. There is a small ecchymosis. There is no palpable mass and no skin or nipple change of concern. The left axilla is benign. The right breast shows no palpable mass, no skin change, and no nipple change. The right axilla also is benign.   LAB RESULTS:  CMP     Component Value Date/Time   NA 140 03/08/2013 1211   NA 138 11/28/2012 1202   K 3.6 03/08/2013 1211   K 3.8 11/28/2012 1202   CL 102 11/28/2012 1202   CO2 26 03/08/2013 1211   CO2 27 11/28/2012 1202   GLUCOSE 84 03/08/2013 1211   GLUCOSE 90 11/28/2012 1202   BUN 15.9 03/08/2013 1211   BUN 21 11/28/2012 1202   CREATININE 1.5* 03/08/2013 1211   CREATININE 1.6* 11/28/2012 1202   CREATININE 1.90* 12/04/2011 1609   CALCIUM 9.5 03/08/2013 1211   CALCIUM 9.1 11/28/2012 1202   PROT 6.9 03/08/2013 1211   PROT 6.5 11/28/2012 1202   ALBUMIN 3.7 03/08/2013 1211   ALBUMIN 3.8 11/28/2012 1202   AST 19 03/08/2013 1211   AST 23 11/28/2012 1202   ALT 16 03/08/2013 1211   ALT 15 11/28/2012 1202   ALKPHOS 65 03/08/2013 1211   ALKPHOS 50 11/28/2012 1202   BILITOT 0.38 03/08/2013 1211   BILITOT 0.6 11/28/2012 1202   GFRNONAA 31.69 04/24/2010 1158    I No results found for this basename: SPEP, UPEP,  kappa and lambda light chains    Lab Results  Component Value Date    WBC 8.2 03/08/2013   NEUTROABS 5.9 03/08/2013   HGB 12.5 03/08/2013   HCT 37.0 03/08/2013   MCV 88.5 03/08/2013   PLT 287 03/08/2013      Chemistry      Component Value Date/Time   NA 140 03/08/2013 1211   NA 138 11/28/2012 1202   K 3.6 03/08/2013 1211   K 3.8 11/28/2012 1202   CL 102 11/28/2012 1202   CO2 26 03/08/2013 1211   CO2 27 11/28/2012 1202   BUN 15.9 03/08/2013 1211   BUN 21 11/28/2012 1202  CREATININE 1.5* 03/08/2013 1211   CREATININE 1.6* 11/28/2012 1202   CREATININE 1.90* 12/04/2011 1609      Component Value Date/Time   CALCIUM 9.5 03/08/2013 1211   CALCIUM 9.1 11/28/2012 1202   ALKPHOS 65 03/08/2013 1211   ALKPHOS 50 11/28/2012 1202   AST 19 03/08/2013 1211   AST 23 11/28/2012 1202   ALT 16 03/08/2013 1211   ALT 15 11/28/2012 1202   BILITOT 0.38 03/08/2013 1211   BILITOT 0.6 11/28/2012 1202       No results found for this basename: LABCA2    No components found with this basename: [id number]    No results found for this basename: INR,  in the last 168 hours  Urinalysis    Component Value Date/Time   BILIRUBINUR neg. 01/31/2013 1145   UROBILINOGEN 0.2 01/31/2013 1145   NITRITE positive 01/31/2013 1145   LEUKOCYTESUR large (3+) 01/31/2013 1145    STUDIES: Mr Breast Bilateral W Wo Contrast  03/06/2013   *RADIOLOGY REPORT*  Clinical Data:  New diagnosis of left breast DCIS.  MRI BILATERAL BREAST WITHOUT AND WITH CONTRAST  Technique: Multiplanar, multisequence MR images of the right breast were obtained prior to and following the intravenous administration of 52ml of Multihance.  Comparison:  Recent imaging examinations.  FINDINGS:  Breast composition:  c:  Heterogeneous fibroglandular tissue  Background parenchymal enhancement:  Moderate  Left breast:  Clumped non mass enhancement is seen in the lower and central outer left breast, measuring 6.5 cm AP x 2.8 cm CC x 2.3 cm TR. This corresponds with the biopsy proven DCIS and calcifications seen mammographically. The biopsy site is 3  cm anterior to the most posterior aspect of abnormal enhancement. No additional areas of abnormal enhancement.  Right breast: There is an 8 x 7 x 6 mm enhancing oval mass in the slightly outer lower posterior right breast. There is associated washout kinetics.  Lymph nodes:  No abnormal appearing axillary or internal mammary lymph nodes.  Ancillary findings:  5 mm T2 hyperintense pericardial focus, likely representing a lymph node. This is nonspecific.  IMPRESSION: 1. Clumped non mass enhancement in the lower and central outer left breast, measuring 6.5 x 2.8 x 2.3 cm. This corresponds with the site of biopsy proven DCIS. If additional biopsy is clinically appropriate to determine extent of disease, this can be done as a stereotactic biopsy.  2.  8 x 7 x 6 mm enhancing mass in the right breast. Further evaluation with ultrasound is recommended.  RECOMMENDATION: Ultrasound of right breast for enhancing mass.  BI-RADS CATEGORY 6:  Known biopsy-proven malignancy - appropriate action should be taken.  THREE-DIMENSIONAL MR IMAGE RENDERING ON INDEPENDENT WORKSTATION:  Three-dimensional MR images were rendered by post-processing of the original MR data on a DynaCad workstation.  The three-dimensional MR images were interpreted, and findings were reported in the accompanying complete MRI report for this study.   Original Report Authenticated By: Donavan Burnet, M.D.    ASSESSMENT: 72 y.o. [place], [place] woman status post left breast upper outer quadrant biopsy 03/02/2013 for ductal carcinoma in situ, intermediate to high-grade, extending clinically over an area of approximately 6.5 cm. The tumor was estrogen and progesterone receptor negative  (1) an 8 mm suspicious lesion in the right breast is schedule for MRI of biopsy 03/15/2013  PLAN: We spent the better part of today's hour-long visit discussing the biology of breast cancer in general, and the specifics of Bianca [name]'s breast cancer. She understands that in  noninvasive breast  cancer in itself is not a life-threatening. It does need to be removed, and since the area in the breast clinic only is so large the surgeon does not feel it is cosmetically feasible for her to undergo lumpectomy. For the same reason on we are concerned there may be some microinvasive deposits within the in situ tumor and a sentinel lymph node will accordingly be performed.  Amorina understands that if the final margins are negative and there is no invasive disease she likely will be cured of this cancer and would not need radiation or anti-estrogens. In fact I expect her next appointment with me, when we will review her final pathology, May will be also her "graduation day".  I have strongly advised Treana to discontinue cigarette smoking, which she understands may compromise her healing. She knows to call for any problems that may develop before her next visit here.   Chauncey Cruel, MD   03/09/2013 7:56 PM

## 2013-03-09 NOTE — Progress Notes (Signed)
I met Bianca [name] in breast clinic.  She was accompanied by her husband and two adult daughters.  She rated her distress on the scale as "7".  I told her about support services and made a referral to [place].  She and her husband also said that he has recently been diagnosed with prostate cancer and the family is dealing with the stress of two diagnoses.  I also gave him information on prostate cancer support group.

## 2013-03-09 NOTE — Progress Notes (Signed)
Radiation Oncology         [phone] ________________________________  Name: Bianca [name] MRN: [id number]  Date: 03/08/2013  DOB: 11-30-1940  [id number] Deborra Medina, MD  Harl Bowie, MD     REFERRING PHYSICIAN: Harl Bowie, MD   DIAGNOSIS: The encounter diagnosis was Breast cancer of upper-outer quadrant of left female breast.   HISTORY OF PRESENT ILLNESS::Bianca [name] is a 72 y.o. female who is seen for an initial consultation visit. The patient is seen today in multidisciplinary breast clinic. The patient was recently found to have some suspicious calcifications within the left breast on recent screening mammography. 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 Receptor studies have indicated that the tumor is ER negative and PR negative.  The patient has also proceeded to undergo an MRI scan of the breasts bilaterally. The known malignancy within the left rest was larger than on original screening mammogram. A clumped non-mass enhancement was present in the lower and central outer left breast measuring 6.5 cm in maximum dimension. Additionally, an 8 mm enhancing mass was present within the right breast for which further characterization was recommended.   PREVIOUS RADIATION THERAPY: No   PAST MEDICAL HISTORY:  has a past medical history of Asthma; Hypertension; and Anxiety.     PAST SURGICAL HISTORY: Past Surgical History  Procedure Laterality Date  . Appendectomy    . Other surgical history      cysts removed from foot  . Other surgical history      cysts removed from breast     FAMILY HISTORY: family history is not on file.   SOCIAL HISTORY:  reports that she quit smoking about 8 years ago. Her smoking use included Cigarettes. She smoked 0.50 packs per day. She does not have any smokeless tobacco history on file. She reports that she does not drink alcohol or use illicit drugs.   ALLERGIES: Review of patient's  allergies indicates no known allergies.   MEDICATIONS:  Current Outpatient Prescriptions  Medication Sig Dispense Refill  . ADVAIR DISKUS 100-50 MCG/DOSE AEPB INHALE 1 PUFF INTO THE LUNGS 2 (TWO) TIMES DAILY.  60 each  5  . albuterol (PROAIR HFA) 108 (90 BASE) MCG/ACT inhaler INHALE 2 PUFFS EVERY 4 HOURS AS NEEDED  8.5 each  5  . busPIRone (BUSPAR) 15 MG tablet Take 1 tablet (15 mg total) by mouth 2 (two) times daily.  60 tablet  11  . CALCIUM PO Take by mouth.      . Cholecalciferol (VITAMIN D-3 PO) Take by mouth.      . fexofenadine (ALLEGRA) 180 MG tablet Take 180 mg by mouth as directed.        . Multiple Vitamin (MULTIVITAMIN) tablet Take 1 tablet by mouth daily.        . Omega-3 Fatty Acids (OMEGA 3 PO) Take by mouth.      . valsartan-hydrochlorothiazide (DIOVAN-HCT) 80-12.5 MG per tablet TAKE 1 TABLET BY MOUTH DAILY.  30 tablet  5   No current facility-administered medications for this encounter.     REVIEW OF SYSTEMS:  A 15 point review of systems is documented in the electronic medical record. This was obtained by the nursing staff. However, I reviewed this with the patient to discuss relevant findings and make appropriate changes.  Pertinent items are noted in HPI.    PHYSICAL EXAM:  vitals were not taken for this visit.  General: Well-developed, in no acute distress HEENT: Normocephalic,  atraumatic; oral cavity clear Neck: Supple without any lymphadenopathy Cardiovascular: Regular rate and rhythm Respiratory: Clear to auscultation bilaterally Breasts: A discrete mass palpable within either breast. No axillary adenopathy on either side GI: Soft, nontender, normal bowel sounds Extremities: No edema present Neuro: No focal deficits     LABORATORY DATA:  Lab Results  Component Value Date   WBC 8.2 03/08/2013   HGB 12.5 03/08/2013   HCT 37.0 03/08/2013   MCV 88.5 03/08/2013   PLT 287 03/08/2013   Lab Results  Component Value Date   NA 140 03/08/2013   K 3.6 03/08/2013     CL 102 11/28/2012   CO2 26 03/08/2013   Lab Results  Component Value Date   ALT 16 03/08/2013   AST 19 03/08/2013   ALKPHOS 65 03/08/2013   BILITOT 0.38 03/08/2013      RADIOGRAPHY: Mr Breast Bilateral W Wo Contrast  03/06/2013   *RADIOLOGY REPORT*  Clinical Data:  New diagnosis of left breast DCIS.  MRI BILATERAL BREAST WITHOUT AND WITH CONTRAST  Technique: Multiplanar, multisequence MR images of the right breast were obtained prior to and following the intravenous administration of 6ml of Multihance.  Comparison:  Recent imaging examinations.  FINDINGS:  Breast composition:  c:  Heterogeneous fibroglandular tissue  Background parenchymal enhancement:  Moderate  Left breast:  Clumped non mass enhancement is seen in the lower and central outer left breast, measuring 6.5 cm AP x 2.8 cm CC x 2.3 cm TR. This corresponds with the biopsy proven DCIS and calcifications seen mammographically. The biopsy site is 3 cm anterior to the most posterior aspect of abnormal enhancement. No additional areas of abnormal enhancement.  Right breast: There is an 8 x 7 x 6 mm enhancing oval mass in the slightly outer lower posterior right breast. There is associated washout kinetics.  Lymph nodes:  No abnormal appearing axillary or internal mammary lymph nodes.  Ancillary findings:  5 mm T2 hyperintense pericardial focus, likely representing a lymph node. This is nonspecific.  IMPRESSION: 1. Clumped non mass enhancement in the lower and central outer left breast, measuring 6.5 x 2.8 x 2.3 cm. This corresponds with the site of biopsy proven DCIS. If additional biopsy is clinically appropriate to determine extent of disease, this can be done as a stereotactic biopsy.  2.  8 x 7 x 6 mm enhancing mass in the right breast. Further evaluation with ultrasound is recommended.  RECOMMENDATION: Ultrasound of right breast for enhancing mass.  BI-RADS CATEGORY 6:  Known biopsy-proven malignancy - appropriate action should be taken.   THREE-DIMENSIONAL MR IMAGE RENDERING ON INDEPENDENT WORKSTATION:  Three-dimensional MR images were rendered by post-processing of the original MR data on a DynaCad workstation.  The three-dimensional MR images were interpreted, and findings were reported in the accompanying complete MRI report for this study.   Original Report Authenticated By: Donavan Burnet, M.D.       IMPRESSION: The patient has a new diagnosis of ductal carcinoma in situ of the left breast. This on MRI scan represents a fairly large 6.5 cm area. Given this extent, it is felt that a mastectomy will be likely necessary for full surgical removal. This has been discussed with the patient by Dr. Ninfa Linden. Based on the available information right now, I do not anticipate the need for postmastectomy radiotherapy subsequently.  The patient also has an 8 mm enhancing lesion seen on MRI scan within the right breast for which further characterization is needed. She will proceed with  a biopsy of this site as well. Depending on the findings she will further discuss treatment options with Dr. Ninfa Linden appropriate. If the patient were to proceed with a lumpectomy then it may be appropriate to further discuss adjuvant radiotherapy on this side. As I discussed with the patient who is trying to taken a lot of information, this is premature as we do not have a diagnosis of malignancy on the right as of yet.   PLAN: The patient will proceed with further workup of the small enhancing lesion on the right. In clinic today, we did not really feel that an additional biopsy on the left would further clarify the extent of disease on the left in a significant way. At this time, there is no anticipated need for radiotherapy. If she does have a malignancy on the right and proceed with breast conservation treatment, and I would be happy to see the patient back in the future to discuss a potential course of adjuvant radiotherapy.    I spent 60 minutes minutes  face to face with the patient and more than 50% of that time was spent in counseling and/or coordination of care.    ________________________________   Jodelle Gross, MD, PhD

## 2013-03-15 ENCOUNTER — Ambulatory Visit
Admission: RE | Admit: 2013-03-15 | Discharge: 2013-03-15 | Disposition: A | Discharge status: Home / Self Care | Payer: Medicare Other | Source: Ambulatory Visit | Attending: Radiology | Admitting: Radiology

## 2013-03-15 DIAGNOSIS — R928 Other abnormal and inconclusive findings on diagnostic imaging of breast: Secondary | ICD-10-CM

## 2013-03-15 MED ORDER — GADOBENATE DIMEGLUMINE 529 MG/ML IV SOLN
5.0000 mL | Freq: Once | INTRAVENOUS | Status: AC | PRN
  Administered 2013-03-15: 5 mL via INTRAVENOUS

## 2013-03-17 ENCOUNTER — Telehealth: Payer: Self-pay | Admitting: *Deleted

## 2013-03-17 NOTE — Telephone Encounter (Signed)
Called and spoke with patient from [facility] 03/08/13.  No questions or concerns at this time.

## 2013-03-23 ENCOUNTER — Ambulatory Visit (INDEPENDENT_AMBULATORY_CARE_PROVIDER_SITE_OTHER): Payer: Medicare Other | Admitting: Surgery

## 2013-03-23 ENCOUNTER — Encounter (INDEPENDENT_AMBULATORY_CARE_PROVIDER_SITE_OTHER): Payer: Self-pay | Admitting: Surgery

## 2013-03-23 VITALS — BP 150/90 | HR 84 | Temp 99.0°F | Resp 15 | Ht 66.5 in | Wt 126.4 lb

## 2013-03-23 DIAGNOSIS — C50919 Malignant neoplasm of unspecified site of unspecified female breast: Secondary | ICD-10-CM

## 2013-03-23 DIAGNOSIS — C50912 Malignant neoplasm of unspecified site of left female breast: Secondary | ICD-10-CM

## 2013-03-23 NOTE — Progress Notes (Signed)
Subjective:     Patient ID: Bianca [name], female   DOB: [birth year], 72 y.o.   MRN: [id number]  HPI Since I saw her in the breast clinic she has had a biopsy of the small lesion in the right breast. This is a sclerosing lesion without evidence of malignancy   Review of Systems     Objective:   Physical Exam Her exam is unchanged. There are no palpable masses in the breast    Assessment:     Left breast cancer and right breast mass     Plan:     She will now scheduled for a left mastectomy with sentinel lymph node biopsy and a right needle localized lumpectomy. Again, she is not interested in seeing a Psychiatric nurse and considering reconstruction for mastectomy. Surgery will be scheduled

## 2013-03-24 ENCOUNTER — Telehealth: Payer: Self-pay | Admitting: *Deleted

## 2013-03-24 ENCOUNTER — Other Ambulatory Visit (INDEPENDENT_AMBULATORY_CARE_PROVIDER_SITE_OTHER): Payer: Self-pay | Admitting: Surgery

## 2013-03-24 NOTE — Telephone Encounter (Signed)
Called and spoke with patient to schedule follow up with Dr. Jana Hakim.  Confirmed apt. For 05/24/13 at 3pm.

## 2013-04-05 ENCOUNTER — Encounter (INDEPENDENT_AMBULATORY_CARE_PROVIDER_SITE_OTHER): Payer: Self-pay

## 2013-04-17 ENCOUNTER — Encounter (HOSPITAL_BASED_OUTPATIENT_CLINIC_OR_DEPARTMENT_OTHER): Payer: Self-pay | Admitting: *Deleted

## 2013-04-17 NOTE — Progress Notes (Signed)
To, come in for bmet-ekg-to bring all meds, overnight bag- Had a cardiac work up 2008 due to htn-dr katz-not needed to back since 2009

## 2013-04-19 ENCOUNTER — Encounter (HOSPITAL_BASED_OUTPATIENT_CLINIC_OR_DEPARTMENT_OTHER)
Admission: RE | Admit: 2013-04-19 | Discharge: 2013-04-19 | Disposition: A | Discharge status: Home / Self Care | Payer: Medicare Other | Source: Ambulatory Visit | Attending: Surgery | Admitting: Surgery

## 2013-04-19 ENCOUNTER — Other Ambulatory Visit: Payer: Self-pay

## 2013-04-19 LAB — BASIC METABOLIC PANEL
CO2: 28 mEq/L (ref 19–32)
Calcium: 9.3 mg/dL (ref 8.4–10.5)
Chloride: 102 mEq/L (ref 96–112)
Creatinine, Ser: 1.64 mg/dL — ABNORMAL HIGH (ref 0.50–1.10)
Glucose, Bld: 148 mg/dL — ABNORMAL HIGH (ref 70–99)
Sodium: 140 mEq/L (ref 135–145)

## 2013-04-21 ENCOUNTER — Encounter (HOSPITAL_BASED_OUTPATIENT_CLINIC_OR_DEPARTMENT_OTHER): Payer: Self-pay | Admitting: Anesthesiology

## 2013-04-21 ENCOUNTER — Ambulatory Visit (HOSPITAL_BASED_OUTPATIENT_CLINIC_OR_DEPARTMENT_OTHER)
Admission: RE | Admit: 2013-04-21 | Discharge: 2013-04-22 | Disposition: A | Discharge status: Home / Self Care | Payer: Medicare Other | Source: Ambulatory Visit | Attending: Surgery | Admitting: Surgery

## 2013-04-21 ENCOUNTER — Ambulatory Visit (HOSPITAL_BASED_OUTPATIENT_CLINIC_OR_DEPARTMENT_OTHER): Payer: Medicare Other | Admitting: Anesthesiology

## 2013-04-21 ENCOUNTER — Encounter (HOSPITAL_COMMUNITY)
Admission: RE | Admit: 2013-04-21 | Discharge: 2013-04-21 | Disposition: A | Discharge status: Home / Self Care | Payer: Medicare Other | Source: Ambulatory Visit | Attending: Surgery | Admitting: Surgery

## 2013-04-21 ENCOUNTER — Ambulatory Visit
Admission: RE | Admit: 2013-04-21 | Discharge: 2013-04-21 | Disposition: A | Discharge status: Home / Self Care | Payer: Medicare Other | Source: Ambulatory Visit | Attending: Surgery | Admitting: Surgery

## 2013-04-21 ENCOUNTER — Encounter (HOSPITAL_BASED_OUTPATIENT_CLINIC_OR_DEPARTMENT_OTHER)
Admission: RE | Disposition: A | Discharge status: Home / Self Care | Payer: Self-pay | Source: Ambulatory Visit | Attending: Surgery

## 2013-04-21 DIAGNOSIS — J449 Chronic obstructive pulmonary disease, unspecified: Secondary | ICD-10-CM | POA: Insufficient documentation

## 2013-04-21 DIAGNOSIS — C50912 Malignant neoplasm of unspecified site of left female breast: Secondary | ICD-10-CM

## 2013-04-21 DIAGNOSIS — D0590 Unspecified type of carcinoma in situ of unspecified breast: Secondary | ICD-10-CM

## 2013-04-21 DIAGNOSIS — R920 Mammographic microcalcification found on diagnostic imaging of breast: Secondary | ICD-10-CM

## 2013-04-21 DIAGNOSIS — D249 Benign neoplasm of unspecified breast: Secondary | ICD-10-CM

## 2013-04-21 DIAGNOSIS — Z79899 Other long term (current) drug therapy: Secondary | ICD-10-CM | POA: Insufficient documentation

## 2013-04-21 DIAGNOSIS — J4489 Other specified chronic obstructive pulmonary disease: Secondary | ICD-10-CM | POA: Insufficient documentation

## 2013-04-21 DIAGNOSIS — Z853 Personal history of malignant neoplasm of breast: Secondary | ICD-10-CM

## 2013-04-21 DIAGNOSIS — N6019 Diffuse cystic mastopathy of unspecified breast: Secondary | ICD-10-CM

## 2013-04-21 DIAGNOSIS — N6029 Fibroadenosis of unspecified breast: Secondary | ICD-10-CM | POA: Insufficient documentation

## 2013-04-21 DIAGNOSIS — I10 Essential (primary) hypertension: Secondary | ICD-10-CM | POA: Insufficient documentation

## 2013-04-21 HISTORY — PX: MASTECTOMY W/ SENTINEL NODE BIOPSY: SHX2001

## 2013-04-21 HISTORY — DX: Presence of dental prosthetic device (complete) (partial): Z97.2

## 2013-04-21 HISTORY — DX: Unspecified osteoarthritis, unspecified site: M19.90

## 2013-04-21 HISTORY — DX: Unspecified hearing loss, unspecified ear: H91.90

## 2013-04-21 HISTORY — DX: Chronic obstructive pulmonary disease, unspecified: J44.9

## 2013-04-21 HISTORY — PX: BREAST LUMPECTOMY WITH NEEDLE LOCALIZATION: SHX5759

## 2013-04-21 HISTORY — DX: Presence of spectacles and contact lenses: Z97.3

## 2013-04-21 LAB — POCT HEMOGLOBIN-HEMACUE: Hemoglobin: 13.1 g/dL (ref 12.0–15.0)

## 2013-04-21 SURGERY — MASTECTOMY WITH SENTINEL LYMPH NODE BIOPSY
Anesthesia: General | Site: Breast | Laterality: Right | Wound class: Clean

## 2013-04-21 MED ORDER — FENTANYL CITRATE 0.05 MG/ML IJ SOLN
INTRAMUSCULAR | Status: DC | PRN
  Administered 2013-04-21: 25 ug via INTRAVENOUS
  Administered 2013-04-21 (×2): 50 ug via INTRAVENOUS

## 2013-04-21 MED ORDER — LACTATED RINGERS IV SOLN
INTRAVENOUS | Status: DC
  Administered 2013-04-21 (×2): via INTRAVENOUS

## 2013-04-21 MED ORDER — SODIUM CHLORIDE 0.9 % IJ SOLN
INTRAMUSCULAR | Status: DC | PRN
  Administered 2013-04-21: 10:00:00

## 2013-04-21 MED ORDER — FENTANYL CITRATE 0.05 MG/ML IJ SOLN
100.0000 ug | Freq: Once | INTRAMUSCULAR | Status: AC
  Administered 2013-04-21: 50 ug via INTRAVENOUS

## 2013-04-21 MED ORDER — 0.9 % SODIUM CHLORIDE (POUR BTL) OPTIME
TOPICAL | Status: DC | PRN
  Administered 2013-04-21: 500 mL

## 2013-04-21 MED ORDER — ENOXAPARIN SODIUM 40 MG/0.4ML ~~LOC~~ SOLN
40.0000 mg | SUBCUTANEOUS | Status: DC
  Administered 2013-04-22: 40 mg via SUBCUTANEOUS
  Filled 2013-04-21: qty 0.4

## 2013-04-21 MED ORDER — CEFAZOLIN SODIUM-DEXTROSE 2-3 GM-% IV SOLR
2.0000 g | INTRAVENOUS | Status: AC
  Administered 2013-04-21: 2 g via INTRAVENOUS

## 2013-04-21 MED ORDER — HYDROCODONE-ACETAMINOPHEN 5-325 MG PO TABS: 1.0000 | ORAL_TABLET | ORAL | Status: DC | PRN

## 2013-04-21 MED ORDER — TECHNETIUM TC 99M SULFUR COLLOID FILTERED
1.0000 | Freq: Once | INTRAVENOUS | Status: AC | PRN
  Administered 2013-04-21: 1 via INTRADERMAL

## 2013-04-21 MED ORDER — HYDROCODONE-ACETAMINOPHEN 5-325 MG PO TABS
1.0000 | ORAL_TABLET | ORAL | Status: DC | PRN
  Administered 2013-04-21 – 2013-04-22 (×2): 1 via ORAL

## 2013-04-21 MED ORDER — BUPIVACAINE-EPINEPHRINE 0.5% -1:200000 IJ SOLN
INTRAMUSCULAR | Status: DC | PRN
  Administered 2013-04-21: 10 mL

## 2013-04-21 MED ORDER — BUSPIRONE HCL 15 MG PO TABS
15.0000 mg | ORAL_TABLET | Freq: Two times a day (BID) | ORAL | Status: DC
  Administered 2013-04-21: 15 mg via ORAL

## 2013-04-21 MED ORDER — PROMETHAZINE HCL 25 MG/ML IJ SOLN: 6.2500 mg | INTRAMUSCULAR | Status: DC | PRN

## 2013-04-21 MED ORDER — ALBUTEROL SULFATE HFA 108 (90 BASE) MCG/ACT IN AERS: 2.0000 | INHALATION_SPRAY | RESPIRATORY_TRACT | Status: DC | PRN

## 2013-04-21 MED ORDER — LIDOCAINE HCL (CARDIAC) 20 MG/ML IV SOLN
INTRAVENOUS | Status: DC | PRN
  Administered 2013-04-21: 50 mg via INTRAVENOUS

## 2013-04-21 MED ORDER — ONDANSETRON HCL 4 MG/2ML IJ SOLN: 4.0000 mg | Freq: Four times a day (QID) | INTRAMUSCULAR | Status: DC | PRN

## 2013-04-21 MED ORDER — OXYCODONE HCL 5 MG/5ML PO SOLN: 5.0000 mg | Freq: Once | ORAL | Status: AC | PRN

## 2013-04-21 MED ORDER — POTASSIUM CHLORIDE IN NACL 20-0.9 MEQ/L-% IV SOLN
INTRAVENOUS | Status: DC
  Administered 2013-04-21: 13:00:00 via INTRAVENOUS

## 2013-04-21 MED ORDER — OXYCODONE HCL 5 MG PO TABS: 5.0000 mg | ORAL_TABLET | Freq: Once | ORAL | Status: AC | PRN

## 2013-04-21 MED ORDER — VALSARTAN-HYDROCHLOROTHIAZIDE 80-12.5 MG PO TABS: 1.0000 | ORAL_TABLET | Freq: Every day | ORAL | Status: DC

## 2013-04-21 MED ORDER — MIDAZOLAM HCL 2 MG/2ML IJ SOLN
1.0000 mg | INTRAMUSCULAR | Status: DC | PRN
  Administered 2013-04-21: 1 mg via INTRAVENOUS

## 2013-04-21 MED ORDER — MORPHINE SULFATE 4 MG/ML IJ SOLN: 4.0000 mg | INTRAMUSCULAR | Status: DC | PRN

## 2013-04-21 MED ORDER — DEXAMETHASONE SODIUM PHOSPHATE 4 MG/ML IJ SOLN
INTRAMUSCULAR | Status: DC | PRN
  Administered 2013-04-21: 10 mg via INTRAVENOUS

## 2013-04-21 MED ORDER — MOMETASONE FURO-FORMOTEROL FUM 100-5 MCG/ACT IN AERO
2.0000 | INHALATION_SPRAY | Freq: Two times a day (BID) | RESPIRATORY_TRACT | Status: DC
  Administered 2013-04-21: 2 via RESPIRATORY_TRACT

## 2013-04-21 MED ORDER — ONDANSETRON HCL 4 MG/2ML IJ SOLN
INTRAMUSCULAR | Status: DC | PRN
  Administered 2013-04-21: 4 mg via INTRAVENOUS

## 2013-04-21 MED ORDER — HYDROMORPHONE HCL PF 1 MG/ML IJ SOLN
0.2500 mg | INTRAMUSCULAR | Status: DC | PRN
  Administered 2013-04-21: 0.5 mg via INTRAVENOUS
  Administered 2013-04-21: 0.25 mg via INTRAVENOUS

## 2013-04-21 MED ORDER — PROPOFOL 10 MG/ML IV BOLUS
INTRAVENOUS | Status: DC | PRN
  Administered 2013-04-21: 40 mg via INTRAVENOUS
  Administered 2013-04-21: 150 mg via INTRAVENOUS

## 2013-04-21 MED ORDER — ONDANSETRON HCL 4 MG PO TABS: 4.0000 mg | ORAL_TABLET | Freq: Four times a day (QID) | ORAL | Status: DC | PRN

## 2013-04-21 SURGICAL SUPPLY — 60 items
APL SKNCLS STERI-STRIP NONHPOA (GAUZE/BANDAGES/DRESSINGS) ×2
APPLIER CLIP 9.375 MED OPEN (MISCELLANEOUS) ×3
APR CLP MED 9.3 20 MLT OPN (MISCELLANEOUS) ×2
BENZOIN TINCTURE PRP APPL 2/3 (GAUZE/BANDAGES/DRESSINGS) ×3 IMPLANT
BINDER BREAST MEDIUM (GAUZE/BANDAGES/DRESSINGS) ×1 IMPLANT
BLADE HEX COATED 2.75 (ELECTRODE) ×3 IMPLANT
BLADE SURG 15 STRL LF DISP TIS (BLADE) ×3 IMPLANT
BLADE SURG 15 STRL SS (BLADE) ×6
BLADE SURG ROTATE 9660 (MISCELLANEOUS) IMPLANT
CANISTER SUCTION 1200CC (MISCELLANEOUS) ×3 IMPLANT
CHLORAPREP W/TINT 26ML (MISCELLANEOUS) ×3 IMPLANT
CLIP APPLIE 9.375 MED OPEN (MISCELLANEOUS) IMPLANT
CLIP TI WIDE RED SMALL 6 (CLIP) IMPLANT
CLOTH BEACON ORANGE TIMEOUT ST (SAFETY) ×3 IMPLANT
COVER MAYO STAND STRL (DRAPES) ×3 IMPLANT
COVER PROBE W GEL 5X96 (DRAPES) ×3 IMPLANT
COVER TABLE BACK 60X90 (DRAPES) ×3 IMPLANT
DECANTER SPIKE VIAL GLASS SM (MISCELLANEOUS) IMPLANT
DEVICE DUBIN W/COMP PLATE 8390 (MISCELLANEOUS) ×1 IMPLANT
DRAIN CHANNEL 19F RND (DRAIN) ×3 IMPLANT
DRAPE LAPAROSCOPIC ABDOMINAL (DRAPES) ×3 IMPLANT
DRAPE PED LAPAROTOMY (DRAPES) ×3 IMPLANT
DRAPE UTILITY XL STRL (DRAPES) ×3 IMPLANT
DRSG TEGADERM 4X4.75 (GAUZE/BANDAGES/DRESSINGS) ×3 IMPLANT
ELECT REM PT RETURN 9FT ADLT (ELECTROSURGICAL) ×3
ELECTRODE REM PT RTRN 9FT ADLT (ELECTROSURGICAL) ×2 IMPLANT
EVACUATOR SILICONE 100CC (DRAIN) ×3 IMPLANT
GAUZE SPONGE 4X4 12PLY STRL LF (GAUZE/BANDAGES/DRESSINGS) ×3 IMPLANT
GLOVE BIOGEL PI IND STRL 7.0 (GLOVE) IMPLANT
GLOVE BIOGEL PI INDICATOR 7.0 (GLOVE) ×1
GLOVE ECLIPSE 6.5 STRL STRAW (GLOVE) ×1 IMPLANT
GLOVE EXAM NITRILE MD LF STRL (GLOVE) ×1 IMPLANT
GLOVE SURG SIGNA 7.5 PF LTX (GLOVE) ×3 IMPLANT
GOWN PREVENTION PLUS XLARGE (GOWN DISPOSABLE) ×3 IMPLANT
GOWN PREVENTION PLUS XXLARGE (GOWN DISPOSABLE) ×3 IMPLANT
HEMOSTAT SURGICEL 2X14 (HEMOSTASIS) IMPLANT
KIT MARKER MARGIN INK (KITS) ×3 IMPLANT
NDL HYPO 25X1 1.5 SAFETY (NEEDLE) ×2 IMPLANT
NDL SAFETY ECLIPSE 18X1.5 (NEEDLE) ×2 IMPLANT
NEEDLE HYPO 18GX1.5 SHARP (NEEDLE) ×3
NEEDLE HYPO 25X1 1.5 SAFETY (NEEDLE) ×6 IMPLANT
NS IRRIG 1000ML POUR BTL (IV SOLUTION) ×3 IMPLANT
PACK BASIN DAY SURGERY FS (CUSTOM PROCEDURE TRAY) ×3 IMPLANT
PENCIL BUTTON HOLSTER BLD 10FT (ELECTRODE) ×3 IMPLANT
PIN SAFETY STERILE (MISCELLANEOUS) ×3 IMPLANT
SLEEVE SCD COMPRESS KNEE MED (MISCELLANEOUS) ×3 IMPLANT
SPONGE LAP 18X18 X RAY DECT (DISPOSABLE) ×3 IMPLANT
SPONGE LAP 4X18 X RAY DECT (DISPOSABLE) ×3 IMPLANT
STRIP CLOSURE SKIN 1/2X4 (GAUZE/BANDAGES/DRESSINGS) ×3 IMPLANT
SUT ETHILON 3 0 PS 1 (SUTURE) ×3 IMPLANT
SUT MNCRL AB 4-0 PS2 18 (SUTURE) ×5 IMPLANT
SUT SILK 2 0 SH (SUTURE) ×3 IMPLANT
SUT VIC AB 3-0 SH 27 (SUTURE) ×9
SUT VIC AB 3-0 SH 27X BRD (SUTURE) ×2 IMPLANT
SYR BULB 3OZ (MISCELLANEOUS) ×3 IMPLANT
SYR CONTROL 10ML LL (SYRINGE) ×6 IMPLANT
TOWEL OR 17X24 6PK STRL BLUE (TOWEL DISPOSABLE) ×3 IMPLANT
TOWEL OR NON WOVEN STRL DISP B (DISPOSABLE) ×5 IMPLANT
TUBE CONNECTING 20X1/4 (TUBING) ×3 IMPLANT
YANKAUER SUCT BULB TIP NO VENT (SUCTIONS) ×3 IMPLANT

## 2013-04-21 NOTE — Anesthesia Postprocedure Evaluation (Signed)
  Anesthesia Post-op Note  Patient: Bianca [name]  Procedure(s) Performed: Procedure(s): MASTECTOMY WITH SENTINEL LYMPH NODE BIOPSY (Left) BREAST LUMPECTOMY WITH NEEDLE LOCALIZATION (Right)  Patient Location: PACU  Anesthesia Type:General  Level of Consciousness: awake and alert   Airway and Oxygen Therapy: Patient Spontanous Breathing  Post-op Pain: mild  Post-op Assessment: Post-op Vital signs reviewed  Post-op Vital Signs: stable  Complications: No apparent anesthesia complications

## 2013-04-21 NOTE — H&P (Signed)
Patient ID: Bianca [name], female DOB: 30-Sep-1940, 72 y.o. MRN: [id number]  No chief complaint on file.  HPI  Bianca [name] is a 72 y.o. female.  HPI  She is referred by Bianca [name] for evaluation of recent calcifications found on mammography the left breast. She had a stereotactic biopsy showing ductal carcinoma in situ. She has had previous breast cysts removed but has never needed surgical excision of a malignancy. She denies nipple discharge. She has no other complaints. An MRI performed also showed a suspicious area in the right breast. This has since been biopsied and shows a sclerosing lesion and a possible intraductal papilloma. Removal of this area has also been recommended. Past Medical History   Diagnosis  Date   .  Asthma    .  Hypertension    .  Anxiety     Past Surgical History   Procedure  Laterality  Date   .  Appendectomy     .  Other surgical history       cysts removed from foot   .  Other surgical history       cysts removed from breast   History reviewed. No pertinent family history.  Social History  History   Substance Use Topics   .  Smoking status:  Former Smoker -- 0.50 packs/day     Types:  Cigarettes     Quit date:  07/20/2004   .  Smokeless tobacco:  Not on file   .  Alcohol Use:  No   No Known Allergies  Current Outpatient Prescriptions   Medication  Sig  Dispense  Refill   .  ADVAIR DISKUS 100-50 MCG/DOSE AEPB  INHALE 1 PUFF INTO THE LUNGS 2 (TWO) TIMES DAILY.  60 each  5   .  albuterol (PROAIR HFA) 108 (90 BASE) MCG/ACT inhaler  INHALE 2 PUFFS EVERY 4 HOURS AS NEEDED  8.5 each  5   .  busPIRone (BUSPAR) 15 MG tablet  Take 1 tablet (15 mg total) by mouth 2 (two) times daily.  60 tablet  11   .  cephALEXin (KEFLEX) 250 MG capsule  Take 1 capsule (250 mg total) by mouth 2 (two) times daily.  14 capsule  0   .  fexofenadine (ALLEGRA) 180 MG tablet  Take 180 mg by mouth as directed.     .  Multiple Vitamin (MULTIVITAMIN) tablet  Take 1 tablet  by mouth daily.     .  valsartan-hydrochlorothiazide (DIOVAN-HCT) 80-12.5 MG per tablet  Take 1 tablet by mouth daily.     .  valsartan-hydrochlorothiazide (DIOVAN-HCT) 80-12.5 MG per tablet  TAKE 1 TABLET BY MOUTH DAILY.  30 tablet  5    No current facility-administered medications for this visit.   Review of Systems  Review of Systems  Constitutional: Negative for fever, chills and unexpected weight change.  HENT: Negative for hearing loss, congestion, sore throat, trouble swallowing and voice change.  Eyes: Negative for visual disturbance.  Respiratory: Negative for cough and wheezing.  Cardiovascular: Negative for chest pain, palpitations and leg swelling.  Gastrointestinal: Negative for nausea, vomiting, abdominal pain, diarrhea, constipation, blood in stool, abdominal distention and anal bleeding.  Genitourinary: Negative for hematuria, vaginal bleeding and difficulty urinating.  Musculoskeletal: Negative for arthralgias.  Skin: Negative for rash and wound.  Neurological: Negative for seizures, syncope and headaches.  Hematological: Negative for adenopathy. Does not bruise/bleed easily.  Psychiatric/Behavioral: Negative for confusion.  There were no vitals taken for  this visit.  Physical Exam  Physical Exam  Constitutional: She is oriented to person, place, and time. She appears well-developed and well-nourished. No distress.  HENT:  Head: Normocephalic and atraumatic.  Right Ear: External ear normal.  Left Ear: External ear normal.  Nose: Nose normal.  Mouth/Throat: Oropharynx is clear and moist. No oropharyngeal exudate.  Eyes: Conjunctivae are normal. Pupils are equal, round, and reactive to light. Right eye exhibits no discharge. Left eye exhibits no discharge. No scleral icterus.  Neck: Normal range of motion. Neck supple. No tracheal deviation present. No thyromegaly present.  Cardiovascular: Normal rate, regular rhythm, normal heart sounds and intact distal pulses.  No  murmur heard.  Pulmonary/Chest: Effort normal and breath sounds normal. No respiratory distress. She has no wheezes. She has no rales.  Abdominal: Soft. Bowel sounds are normal. She exhibits no distension. There is no tenderness.  Musculoskeletal: Normal range of motion. She exhibits no edema and no tenderness.  Lymphadenopathy:  She has no cervical adenopathy.  She has no axillary adenopathy.  Neurological: She is alert and oriented to person, place, and time.  Skin: Skin is warm and dry. No rash noted. She is not diaphoretic. No erythema.  Psychiatric: Her behavior is normal. Judgment normal.  Breast: no palpable masses in either breast   Data Reviewed  Mammograms, MRI, and biopsy results have been reviewed. The pathology shows ductal carcinoma in situ which is ER and PR negative. The MRI shows the extent of disease to be 6.5 cm in size. There is a suspicious area in the right breast. Biopsy of the suspicious area in the right breast shows a sclerosing lesion with intraductal papilloma being favored. 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 To remove this area would create too large of a cosmetic defect. Given the rapidity of the development of this area, even if a biopsy of the furthest extent was negative, I am still uncertain whether we would be leaving disease behind or not. I would also recommend sentinel lymph node biopsy at the time of the mastectomy. The suspicious lesion in the right breast also needs a needle localized lumpectomy. After a thorough discussion, she wished to proceed with a left mastectomy and sentinel lode biopsy as well as any localized right breast lumpectomy. She elects to forego a discussion with  plastic surgery and immediate reconstruction. I discussed the surgery with her in detail. I discussed the risks which includes but is not limited to bleeding, infection, injury to shredding structures, need for drain placement, need for further surgery should malignancy be present in the right breast, et Ronney Asters. She understands and wishes to proceed. Surgery is scheduled

## 2013-04-21 NOTE — Op Note (Signed)
Bianca [name]               ACCOUNT NO.:  [id number]  MEDICAL RECORD NO.:  [id number]  LOCATION:  [facility]                          FACILITY:  [facility]  PHYSICIAN:  Coralie Keens, M.D. DATE OF BIRTH:  12-28-1940  DATE OF PROCEDURE:  04/21/2013 DATE OF DISCHARGE:                              OPERATIVE REPORT   PREOPERATIVE DIAGNOSES: 1. Ductal carcinoma in situ of the left breast. 2. A sclerosing papillary lesion, right breast.  POSTOPERATIVE DIAGNOSES: 1. Ductal carcinoma in situ of the left breast. 2. Sclerosing papillary lesion right breast.  PROCEDURE: 1. Left total mastectomy with sentinel lymph node biopsy. 2. Needle localized right breast lumpectomy. 3. Injection of blue dye.  SURGEON:  Coralie Keens, MD  ANESTHESIA:  General endotracheal anesthesia and 0.5% Marcaine with epinephrine.  ESTIMATED BLOOD LOSS:  Minimal.  INDICATIONS:  This is a 72 year old female who was found to have ductal carcinoma in situ of the left breast, this was a wide area, and her only option would be to undergo a mastectomy for cosmetic results.  She has sought to forego immediate reconstruction, and discussed with a Psychiatric nurse.  An MRI of her breast also showed an abnormal lesion in the right breast.  Biopsy of this revealed a sclerosing lesion with papillary features.  Decision was made to proceed with needle localized lumpectomy of this area.  FINDINGS:  The patient was found to have 3 sentinel lymph nodes removed from the left axilla.  PROCEDURE IN DETAIL:  The patient was brought to the operating room and identified as Bianca [name].  She had already had radioactive isotope injected into the left breast and a needle localization [name] placed in the right breast.  She was placed supine on the operating table and general anesthesia was induced.  I then injected blue dye underneath the areola and gently massaged the left breast.  Her chest and breast were then prepped and  draped in usual sterile fashion.  I turned my attention towards the right breast first.  I made an elliptical incision around the localization [name] which was at the outer lower quadrant of the breast.  I took this down to the breast tissue with electrocautery.  I then performed a wide lumpectomy going down to the chest wall removing all tissue around localization [name].  Once the specimen was removed, x- ray confirmed the suspicious area was in the biopsy specimen.  At this point, I irrigated the wound with saline.  Hemostasis was achieved with cautery.  I anesthetized the wound with Marcaine.  I closed the subcutaneous tissue with interrupted 3-0 Vicryl sutures and closed the skin with a running 4-0 Monocryl.  Next, I turned my attention towards the left breast.  I made an elliptical incision going from lateral to the sternum towards the axilla incorporating the nipple and areolar complex with a scalpel and took this down to the breast tissue with electrocautery.  I performed the superior skin flap first.  I dissected down to the breast tissue and took this dissection down toward the level of the clavicle.  I then dissected the inferior flap going down to the inframammary ridge.  I then removed all of the breast  tissue going from medial to lateral over the pectoralis fascia with the electrocautery. Once I reached the axilla, a Neoprobe was brought into the field.  I identified 3 lymph nodes in the axilla.  All 3 had a radio uptake and radioisotope but no uptake of blue dye.  These were sent separately to pathology for evaluation.  No other abnormalities or palpable lymph nodes were identified.  I then completely excised the rest of the breast tissue completing the mastectomy.  Specimen was sent to pathology for evaluation.  I thoroughly irrigated the incision and the chest cavity and axilla with saline.  Hemostasis appeared to be achieved.  I made a separate incision  with a scalpel and  placed a 19-French Blake drain into the axilla and chest wall.  I sewed this in place with a nylon suture.  I then closed the subcutaneous tissue with interrupted 3-0 Vicryl sutures and closed the skin with a running 4-0 Monocryl.  Steri- Strips, gauze, and tape were then applied.  I placed Steri-Strips on the right breast incision as well.  A binder was then placed around the patient.  The patient tolerated the procedure well.  All counts were correct at the end of the procedure.  The patient was then extubated in the operating room and taken in stable condition to recovery room.     Coralie Keens, M.D.     DB/MEDQ  D:  04/21/2013  T:  04/21/2013  Job:  [id number]

## 2013-04-21 NOTE — Transfer of Care (Signed)
Immediate Anesthesia Transfer of Care Note  Patient: Bianca [name]  Procedure(s) Performed: Procedure(s): MASTECTOMY WITH SENTINEL LYMPH NODE BIOPSY (Left) BREAST LUMPECTOMY WITH NEEDLE LOCALIZATION (Right)  Patient Location: PACU  Anesthesia Type:General  Level of Consciousness: sedated  Airway & Oxygen Therapy:Breathing Spont, O2 per Face mask    Post-op Assessment: Report given to PACU RN and Patient moving all extremities  Post vital signs: Reviewed and stable  Complications: No apparent anesthesia complications

## 2013-04-21 NOTE — Anesthesia Preprocedure Evaluation (Addendum)
Anesthesia Evaluation  Patient identified by MRN, date of birth, ID band Patient awake    Reviewed: Allergy & Precautions, H&P , NPO status   Airway Mallampati: II  Neck ROM: Full    Dental   Pulmonary asthma , COPD breath sounds clear to auscultation  + wheezing      Cardiovascular hypertension, Rhythm:Regular Rate:Normal     Neuro/Psych    GI/Hepatic   Endo/Other    Renal/GU      Musculoskeletal   Abdominal   Peds  Hematology   Anesthesia Other Findings   Reproductive/Obstetrics                          Anesthesia Physical Anesthesia Plan  ASA: III  Anesthesia Plan: General   Post-op Pain Management:    Induction: Intravenous  Airway Management Planned: LMA  Additional Equipment:   Intra-op Plan:   Post-operative Plan: Extubation in OR  Informed Consent: I have reviewed the patients History and Physical, chart, labs and discussed the procedure including the risks, benefits and alternatives for the proposed anesthesia with the patient or authorized representative who has indicated his/her understanding and acceptance.     Plan Discussed with: CRNA and Surgeon  Anesthesia Plan Comments:         Anesthesia Quick Evaluation

## 2013-04-21 NOTE — Anesthesia Procedure Notes (Signed)
Procedure Name: LMA Insertion Date/Time: 04/21/2013 9:50 AM Performed by: Bethena Roys T Pre-anesthesia Checklist: Patient identified, Emergency Drugs available, Suction available and Patient being monitored Patient Re-evaluated:Patient Re-evaluated prior to inductionOxygen Delivery Method: Circle System Utilized Preoxygenation: Pre-oxygenation with 100% oxygen Intubation Type: IV induction Ventilation: Mask ventilation without difficulty LMA: LMA inserted LMA Size: 4.0 Number of attempts: 1 Placement Confirmation: positive ETCO2 Tube secured with: Tape Dental Injury: Teeth and Oropharynx as per pre-operative assessment

## 2013-04-21 NOTE — Op Note (Signed)
MASTECTOMY WITH SENTINEL LYMPH NODE BIOPSY, BREAST LUMPECTOMY WITH NEEDLE LOCALIZATION  Procedure Note  Bianca [name] 04/21/2013   Pre-op Diagnosis: Left breast cancer, right breast mass     Post-op Diagnosis: same  Procedure(s): LEFT MASTECTOMY WITH SENTINEL LYMPH NODE BIOPSY RIGHT BREAST LUMPECTOMY WITH NEEDLE LOCALIZATION INJECTIO OF BLUE DYE Surgeon(s): Harl Bowie, MD  Anesthesia: General  Staff:  Circulator: Billey Co, RN; Eston Esters, RN Scrub Person: Theressa Stamps, CST Circulator Assistant: Silvio Clayman, CST  Estimated Blood Loss: Minimal               Specimens: SENT TO PATH          [name]   Date: 04/21/2013  Time: 11:21 AM

## 2013-04-24 ENCOUNTER — Ambulatory Visit (INDEPENDENT_AMBULATORY_CARE_PROVIDER_SITE_OTHER): Payer: Medicare Other | Admitting: Surgery

## 2013-04-24 ENCOUNTER — Encounter (HOSPITAL_BASED_OUTPATIENT_CLINIC_OR_DEPARTMENT_OTHER): Payer: Self-pay | Admitting: Surgery

## 2013-04-24 VITALS — BP 118/70 | HR 100 | Temp 98.9°F | Resp 14 | Ht 66.5 in | Wt 127.6 lb

## 2013-04-24 DIAGNOSIS — Z09 Encounter for follow-up examination after completed treatment for conditions other than malignant neoplasm: Secondary | ICD-10-CM

## 2013-04-24 NOTE — Progress Notes (Signed)
Subjective:     Patient ID: Bianca [name], female   DOB: March 30, 1941, 72 y.o.   MRN: [id number]  HPI She is here for postop visit status post mastectomy 3 days ago. She was concerned about the change in the color of the drainage and a Jackson-Pratt drain. She otherwise feels well  Review of Systems     Objective:   Physical Exam The drain is actually more serous and serosanguineous now. This is normal.  There is no evidence of infection. I went ahead and stripped during    Assessment:     Patient stable postop     Plan:     She will keep her appointment this Thursday. I am awaiting the pathology results

## 2013-04-27 ENCOUNTER — Encounter (INDEPENDENT_AMBULATORY_CARE_PROVIDER_SITE_OTHER): Payer: Self-pay | Admitting: Surgery

## 2013-04-27 ENCOUNTER — Ambulatory Visit (INDEPENDENT_AMBULATORY_CARE_PROVIDER_SITE_OTHER): Payer: Medicare Other | Admitting: Surgery

## 2013-04-27 VITALS — BP 124/72 | HR 100 | Resp 16 | Ht 66.5 in | Wt 125.8 lb

## 2013-04-27 DIAGNOSIS — Z09 Encounter for follow-up examination after completed treatment for conditions other than malignant neoplasm: Secondary | ICD-10-CM

## 2013-04-27 NOTE — Progress Notes (Signed)
Subjective:     Patient ID: Bianca [name], female   DOB: [birth year], 72 y.o.   MRN: [id number]  HPI She is here for another postop visit. She is doing well. The drain the left side is draining minimal fluid.  Review of Systems     Objective:   Physical Exam On exam, the right breast incision is healing well. The left mastectomy site is also healing well. I removed the drain. 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 All nodes were negative    Assessment:     Patient stable postop     Plan:     She may resume her normal activity. I will see her back in 1 month unless a seroma developed. I will write her for a post mastectomy bra at that time. She will follow up with the cancer center as well

## 2013-05-04 ENCOUNTER — Telehealth (INDEPENDENT_AMBULATORY_CARE_PROVIDER_SITE_OTHER): Payer: Self-pay

## 2013-05-04 ENCOUNTER — Telehealth (INDEPENDENT_AMBULATORY_CARE_PROVIDER_SITE_OTHER): Payer: Self-pay | Admitting: General Surgery

## 2013-05-04 NOTE — Telephone Encounter (Signed)
Patient states she has not had her left dressing changed since last ov 04/27/13. I spoke to Dr Ninfa Linden s' asst  [place] she states the daughter was changing dressing to call daughter to verify. Voice mail for daughter to call ov Daughters #  [phone] per patient Patient can not tell me what the incision looks like.

## 2013-05-04 NOTE — Telephone Encounter (Signed)
Called Daughter back and told her what to do about the wound, and the lump and I told her if the drain site has closed up that she does not need to put anything over it

## 2013-05-11 ENCOUNTER — Telehealth: Payer: Self-pay | Admitting: *Deleted

## 2013-05-11 NOTE — Telephone Encounter (Signed)
SPOKE TO DR.MAGRINAT'S NURSE, AMY MITCHELL,RN. PT. MAY HAVE A FLU SHOT. NOTIFIED PT. SHE IS AWARE TO HAVE THE FLU SHOT GIVEN IN HER RIGHT ARM.

## 2013-05-24 ENCOUNTER — Ambulatory Visit (HOSPITAL_BASED_OUTPATIENT_CLINIC_OR_DEPARTMENT_OTHER): Payer: Medicare Other | Admitting: Oncology

## 2013-05-24 VITALS — BP 153/82 | HR 94 | Temp 98.0°F | Resp 18 | Ht 66.5 in | Wt 126.7 lb

## 2013-05-24 DIAGNOSIS — C50412 Malignant neoplasm of upper-outer quadrant of left female breast: Secondary | ICD-10-CM

## 2013-05-24 DIAGNOSIS — C50419 Malignant neoplasm of upper-outer quadrant of unspecified female breast: Secondary | ICD-10-CM

## 2013-05-24 NOTE — Progress Notes (Signed)
ID: Bianca [name] OB: 07-25-1940  MR#: [id number]  [id number]  PCP: Bianca Norris, MD GYN:  Bianca [name] SU: Bianca [name] OTHER MD: Bianca [name]   HISTORY OF PRESENT ILLNESS: Bianca [name] underwent routine mammographic screening at [facility] 02/22/2013 showing a possible abnormality in the left breast. Additional views age 72/20/2014 confirmed a new calcifications in the upper outer quadrant of the left breast extending over an area of 5.2 cm. Biopsy of this area 03/02/2013 showed (SAA [id number]) ductal carcinoma in situ, grade 2 or 3, estrogen and progesterone receptor negative.  Bilateral breast MRI 03/06/2013 showed the area of non-masslike enhancement in the left breast to measure total of 6.5 cm extending over 2 quadrants. There were no other areas of concern in the left breast and there was no suspicious adenopathy noted. In the right breast, an 8 mm enhancing mass was felt to be suspicious enough to warrant biopsy.  The patient's subsequent history is as detailed below  INTERVAL HISTORY: Bianca [name] returns for followup of her breast cancer accompanied by her husband Bianca [name]. Since her last visit here she underwent left mastectomy, which showed no evidence of invasive disease. The tumor was estrogen and progesterone receptor negative.  REVIEW OF SYSTEMS: Bianca [name] tolerated the surgery markedly well. There was minimal pain, no significant bleeding or swelling, and no rash. She complains of hearing loss, ankle swelling at times, and sometimes feeling a bit tired. No these symptoms are new or more persistent or intense than prior. A detailed review of systems today was otherwise stable  PAST MEDICAL HISTORY: Past Medical History  Diagnosis Date  . Asthma   . Hypertension   . Anxiety   . COPD (chronic obstructive pulmonary disease)   . Wears glasses   . Wears dentures     top and partial bottom  . HOH (hard of hearing)   . Arthritis     PAST SURGICAL HISTORY: Past Surgical History  Procedure  Laterality Date  . Appendectomy    . Other surgical history      cysts removed from foot  . Other surgical history      cysts removed from breast  . Breast surgery  1985    cyst rt br  . Tubal ligation    . Mastectomy w/ sentinel node biopsy Left 04/21/2013    Procedure: MASTECTOMY WITH SENTINEL LYMPH NODE BIOPSY;  Surgeon: Bianca Bowie, MD;  Location: [place];  Service: General;  Laterality: Left;  . Breast lumpectomy with needle localization Right 04/21/2013    Procedure: BREAST LUMPECTOMY WITH NEEDLE LOCALIZATION;  Surgeon: Bianca Bowie, MD;  Location: [place];  Service: General;  Laterality: Right;    FAMILY HISTORY No family history on file. The patient's father was diagnosed with prostate cancer the age of 6 and died 3 years later. The patient's mother died from cancer of the vulva at age 74. The patient had 2 brothers and 2 sisters. One of these 2 sisters was diagnosed with breast cancer at the age of 17. There is no history of ovarian cancer in the family.  Menarche age 46, first live birth age 23, the patient is [name]. She underwent menopause approximately age 83. 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 Her husband Bianca [name] used to work for [employer]. Both of them are now retired. Daughter Bianca [name] lives in [name] and works for [employer] of a home  care agency. Daughter Bianca [name] lives in [place] and is a stay-at-home mom. The patient has a 25 year old grandson and a 4 year old granddaughter. She attends [employer].     ADVANCED DIRECTIVES: Not in place  HEALTH MAINTENANCE: History  Substance Use Topics  . Smoking status: Current Every Day Smoker -- 0.50 packs/day    Types: Cigarettes    Last Attempt to Quit: 07/20/2004  . Smokeless tobacco: Not on file  . Alcohol Use: No   the patient continues to smoke EC are  its.   Colonoscopy:  PAP:  Bone density: [id number], at [facility], showing osteoporosis with a T score of -3.5  Lipid panel:  No Known Allergies  Current Outpatient Prescriptions  Medication Sig Dispense Refill  . ADVAIR DISKUS 100-50 MCG/DOSE AEPB INHALE 1 PUFF INTO THE LUNGS 2 (TWO) TIMES DAILY.  60 each  5  . albuterol (PROAIR HFA) 108 (90 BASE) MCG/ACT inhaler INHALE 2 PUFFS EVERY 4 HOURS AS NEEDED  8.5 each  5  . busPIRone (BUSPAR) 15 MG tablet Take 1 tablet (15 mg total) by mouth 2 (two) times daily.  60 tablet  11  . CALCIUM PO Take by mouth.      . Cholecalciferol (VITAMIN D-3 PO) Take by mouth.      . fexofenadine (ALLEGRA) 180 MG tablet Take 180 mg by mouth as directed.        Marland Kitchen HYDROcodone-acetaminophen (NORCO) 5-325 MG per tablet Take 1-2 tablets by mouth every 4 (four) hours as needed for pain.  40 tablet  0  . Multiple Vitamin (MULTIVITAMIN) tablet Take 1 tablet by mouth daily.        . Omega-3 Fatty Acids (OMEGA 3 PO) Take by mouth.      . valsartan-hydrochlorothiazide (DIOVAN-HCT) 80-12.5 MG per tablet TAKE 1 TABLET BY MOUTH DAILY.  30 tablet  5   No current facility-administered medications for this visit.    OBJECTIVE: Older white woman in no acute distress Filed Vitals:   05/24/13 1510  BP: 153/82  Pulse: 94  Temp: 98 F (36.7 C)  Resp: 18     Body mass index is 20.15 kg/(m^2).    ECOG FS:1 - Symptomatic but completely ambulatory  Sclerae unicteric, pupils equal and reactive to light Oropharynx shows no thrush or other lesions No cervical or supraclavicular adenopathy Lungs no rales or rhonchi, fair excursion bilaterally Heart regular rate and rhythm Abd soft, nontender, positive bowel sounds MSK no focal spinal tenderness, no peripheral edema Neuro: non-focal, well-oriented, appropriate affect Breasts: The left breast is status post mastectomy. The incision is healing very nicely, with Steri-Strips still in place. There is no significant erythema or swelling.  The right breast is unremarkable. The right axilla is benign.   LAB RESULTS:  CMP     Component Value Date/Time   NA 140 04/19/2013 1500   NA 140 03/08/2013 1211   K 4.2 04/19/2013 1500   K 3.6 03/08/2013 1211   CL 102 04/19/2013 1500   CO2 28 04/19/2013 1500   CO2 26 03/08/2013 1211   GLUCOSE 148* 04/19/2013 1500   GLUCOSE 84 03/08/2013 1211   BUN 20 04/19/2013 1500   BUN 15.9 03/08/2013 1211   CREATININE 1.64* 04/19/2013 1500   CREATININE 1.5* 03/08/2013 1211   CREATININE 1.90* 12/04/2011 1609   CALCIUM 9.3 04/19/2013 1500   CALCIUM 9.5 03/08/2013 1211   PROT 6.9 03/08/2013 1211   PROT 6.5 11/28/2012 1202   ALBUMIN 3.7 03/08/2013 1211  ALBUMIN 3.8 11/28/2012 1202   AST 19 03/08/2013 1211   AST 23 11/28/2012 1202   ALT 16 03/08/2013 1211   ALT 15 11/28/2012 1202   ALKPHOS 65 03/08/2013 1211   ALKPHOS 50 11/28/2012 1202   BILITOT 0.38 03/08/2013 1211   BILITOT 0.6 11/28/2012 1202   GFRNONAA 30* 04/19/2013 1500   GFRAA 34* 04/19/2013 1500    I No results found for this basename: SPEP,  UPEP,   kappa and lambda light chains    Lab Results  Component Value Date   WBC 8.2 03/08/2013   NEUTROABS 5.9 03/08/2013   HGB 13.1 04/21/2013   HCT 37.0 03/08/2013   MCV 88.5 03/08/2013   PLT 287 03/08/2013      Chemistry      Component Value Date/Time   NA 140 04/19/2013 1500   NA 140 03/08/2013 1211   K 4.2 04/19/2013 1500   K 3.6 03/08/2013 1211   CL 102 04/19/2013 1500   CO2 28 04/19/2013 1500   CO2 26 03/08/2013 1211   BUN 20 04/19/2013 1500   BUN 15.9 03/08/2013 1211   CREATININE 1.64* 04/19/2013 1500   CREATININE 1.5* 03/08/2013 1211   CREATININE 1.90* 12/04/2011 1609      Component Value Date/Time   CALCIUM 9.3 04/19/2013 1500   CALCIUM 9.5 03/08/2013 1211   ALKPHOS 65 03/08/2013 1211   ALKPHOS 50 11/28/2012 1202   AST 19 03/08/2013 1211   AST 23 11/28/2012 1202   ALT 16 03/08/2013 1211   ALT 15 11/28/2012 1202   BILITOT 0.38 03/08/2013 1211   BILITOT 0.6 11/28/2012 1202       No results found for  this basename: LABCA2    No components found with this basename: [id number]    No results found for this basename: INR,  in the last 168 hours  Urinalysis    Component Value Date/Time   BILIRUBINUR neg. 01/31/2013 1145   UROBILINOGEN 0.2 01/31/2013 1145   NITRITE positive 01/31/2013 1145   LEUKOCYTESUR large (3+) 01/31/2013 1145    STUDIES: No results found.  ASSESSMENT: 72 y.o. 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 The tumor was estrogen and progesterone receptor negative  (1) biopsy of an 8 mm area of concern in the right breast 03/15/2013 showed a sclerosing lesion, removed by right lumpectomy 04/21/2013, which showed a sclerosing ductal papilloma, no evidence of malignancy  (2) left mastectomy and sentinel lymph node sampling 04/21/2013 showed a 2.5 cm area of ductal carcinoma in situ, high-grade, with ample margins, all 3 sentinel lymph nodes benign, estrogen and progesterone receptor negative.  PLAN: We went over Bianca [name]'s situation in detail. She understands that since she did not have any invasive component to her tumor this cancer was not life-threatening. She understands also that noninvasive breast cancer, trapped in the ducts, is cured when all the docs are removed, as is the case with mastectomy.  Accordingly I am comfortable releasing her from further followup here. She will need yearly right mammography and a yearly physician breast and chest wall exam, which she intends to obtain through Dr. Zenia Resides Ross's office. I will be glad to see her again at any time in the future if the occasion arises, but we are making no further routine appointments for her here.   Chauncey Cruel, MD   05/24/2013 3:24 PM

## 2013-05-25 ENCOUNTER — Telehealth: Payer: Self-pay | Admitting: Oncology

## 2013-05-25 ENCOUNTER — Encounter: Payer: Self-pay | Admitting: *Deleted

## 2013-05-25 NOTE — Telephone Encounter (Signed)
Sent letter to Dr. Arnette Norris office from Dr. Jana Hakim

## 2013-05-29 ENCOUNTER — Other Ambulatory Visit (INDEPENDENT_AMBULATORY_CARE_PROVIDER_SITE_OTHER): Payer: Self-pay | Admitting: *Deleted

## 2013-05-29 ENCOUNTER — Encounter (INDEPENDENT_AMBULATORY_CARE_PROVIDER_SITE_OTHER): Payer: Self-pay | Admitting: Surgery

## 2013-05-29 ENCOUNTER — Ambulatory Visit (INDEPENDENT_AMBULATORY_CARE_PROVIDER_SITE_OTHER): Payer: Medicare Other | Admitting: Surgery

## 2013-05-29 VITALS — BP 138/88 | HR 88 | Temp 98.6°F | Resp 15 | Ht 66.5 in | Wt 123.4 lb

## 2013-05-29 DIAGNOSIS — Z09 Encounter for follow-up examination after completed treatment for conditions other than malignant neoplasm: Secondary | ICD-10-CM

## 2013-05-29 MED ORDER — UNABLE TO FIND: Status: DC

## 2013-05-29 NOTE — Progress Notes (Signed)
Subjective:     Patient ID: Bianca [name], female   DOB: Oct 29, 1940, 72 y.o.   MRN: [id number]  HPI She is here for another postop visit status post left ostectomy for ductal carcinoma in situ. She is doing well and has no complaints.  Review of Systems     Objective:   Physical Exam On exam, her incision is well-healed. There is no evidence of seroma of the left chest. There is no swelling of the left arm    Assessment:     Patient doing well status post left mastectomy for ductal carcinoma in situ     Plan:     She may completely remove her Steri-Strips. We will also give her prescriptions for post mastectomy bras.  I will see her back in one year

## 2013-06-26 ENCOUNTER — Other Ambulatory Visit: Payer: Self-pay | Admitting: Family Medicine

## 2013-07-06 ENCOUNTER — Other Ambulatory Visit: Payer: Self-pay | Admitting: Family Medicine

## 2013-07-06 MED ORDER — ALBUTEROL SULFATE HFA 108 (90 BASE) MCG/ACT IN AERS: 1.0000 | INHALATION_SPRAY | RESPIRATORY_TRACT | Status: DC | PRN

## 2013-07-06 NOTE — Telephone Encounter (Signed)
Rx called in to pharmacy. 

## 2013-07-06 NOTE — Addendum Note (Signed)
Addended by: Lurlean Nanny on: 07/06/2013 09:56 AM   Modules accepted: Orders

## 2013-10-26 ENCOUNTER — Telehealth: Payer: Self-pay | Admitting: Family Medicine

## 2013-10-26 ENCOUNTER — Encounter: Payer: Self-pay | Admitting: Family Medicine

## 2013-10-26 ENCOUNTER — Encounter: Payer: Self-pay | Admitting: *Deleted

## 2013-10-26 ENCOUNTER — Ambulatory Visit (INDEPENDENT_AMBULATORY_CARE_PROVIDER_SITE_OTHER): Payer: Medicare Other | Admitting: Family Medicine

## 2013-10-26 VITALS — BP 138/70 | HR 83 | Temp 97.5°F | Ht 65.25 in | Wt 127.5 lb

## 2013-10-26 DIAGNOSIS — F17200 Nicotine dependence, unspecified, uncomplicated: Secondary | ICD-10-CM

## 2013-10-26 DIAGNOSIS — N183 Chronic kidney disease, stage 3 unspecified: Secondary | ICD-10-CM

## 2013-10-26 DIAGNOSIS — I10 Essential (primary) hypertension: Secondary | ICD-10-CM

## 2013-10-26 DIAGNOSIS — J449 Chronic obstructive pulmonary disease, unspecified: Secondary | ICD-10-CM

## 2013-10-26 DIAGNOSIS — Z23 Encounter for immunization: Secondary | ICD-10-CM

## 2013-10-26 DIAGNOSIS — C50419 Malignant neoplasm of upper-outer quadrant of unspecified female breast: Secondary | ICD-10-CM

## 2013-10-26 DIAGNOSIS — Z136 Encounter for screening for cardiovascular disorders: Secondary | ICD-10-CM

## 2013-10-26 DIAGNOSIS — C50412 Malignant neoplasm of upper-outer quadrant of left female breast: Secondary | ICD-10-CM

## 2013-10-26 DIAGNOSIS — Z0000 Encounter for general adult medical examination without abnormal findings: Secondary | ICD-10-CM

## 2013-10-26 DIAGNOSIS — Z720 Tobacco use: Secondary | ICD-10-CM

## 2013-10-26 LAB — CBC WITH DIFFERENTIAL/PLATELET
BASOS PCT: 0.6 % (ref 0.0–3.0)
Basophils Absolute: 0 10*3/uL (ref 0.0–0.1)
EOS PCT: 2.1 % (ref 0.0–5.0)
Eosinophils Absolute: 0.2 10*3/uL (ref 0.0–0.7)
HCT: 36.6 % (ref 36.0–46.0)
Hemoglobin: 12 g/dL (ref 12.0–15.0)
LYMPHS PCT: 40.5 % (ref 12.0–46.0)
Lymphs Abs: 3 10*3/uL (ref 0.7–4.0)
MCHC: 32.8 g/dL (ref 30.0–36.0)
MCV: 88.8 fl (ref 78.0–100.0)
Monocytes Absolute: 0.7 10*3/uL (ref 0.1–1.0)
Monocytes Relative: 9.2 % (ref 3.0–12.0)
NEUTROS ABS: 3.5 10*3/uL (ref 1.4–7.7)
NEUTROS PCT: 47.6 % (ref 43.0–77.0)
Platelets: 305 10*3/uL (ref 150.0–400.0)
RBC: 4.13 Mil/uL (ref 3.87–5.11)
RDW: 14.7 % — ABNORMAL HIGH (ref 11.5–14.6)
WBC: 7.4 10*3/uL (ref 4.5–10.5)

## 2013-10-26 LAB — COMPREHENSIVE METABOLIC PANEL
ALBUMIN: 3.9 g/dL (ref 3.5–5.2)
ALT: 17 U/L (ref 0–35)
AST: 20 U/L (ref 0–37)
Alkaline Phosphatase: 61 U/L (ref 39–117)
BUN: 19 mg/dL (ref 6–23)
CALCIUM: 9.2 mg/dL (ref 8.4–10.5)
CHLORIDE: 101 meq/L (ref 96–112)
CO2: 28 mEq/L (ref 19–32)
Creatinine, Ser: 1.4 mg/dL — ABNORMAL HIGH (ref 0.4–1.2)
GFR: 38.68 mL/min — AB (ref >60.00)
Glucose, Bld: 90 mg/dL (ref 70–99)
POTASSIUM: 4 meq/L (ref 3.5–5.1)
Sodium: 138 mEq/L (ref 135–145)
Total Bilirubin: 0.7 mg/dL (ref 0.3–1.2)
Total Protein: 6.6 g/dL (ref 6.0–8.3)

## 2013-10-26 LAB — TSH: TSH: 2.79 u[IU]/mL (ref 0.35–5.50)

## 2013-10-26 LAB — LIPID PANEL
Cholesterol: 158 mg/dL (ref 0–200)
HDL: 60.3 mg/dL (ref >39.00)
LDL Cholesterol: 85 mg/dL (ref 0–99)
TRIGLYCERIDES: 62 mg/dL (ref 0.0–149.0)
Total CHOL/HDL Ratio: 3
VLDL: 12.4 mg/dL (ref 0.0–40.0)

## 2013-10-26 MED ORDER — BUPROPION HCL ER (SMOKING DET) 150 MG PO TB12: 150.0000 mg | ORAL_TABLET | Freq: Two times a day (BID) | ORAL | Status: DC

## 2013-10-26 NOTE — Telephone Encounter (Signed)
Relevant patient education assigned to patient using Emmi. ° °

## 2013-10-26 NOTE — Assessment & Plan Note (Signed)
Followed by Dr. Florene Glen. Recheck renal function today.

## 2013-10-26 NOTE — Progress Notes (Signed)
Pre visit review using our clinic review tool, if applicable. No additional management support is needed unless otherwise documented below in the visit note. 

## 2013-10-26 NOTE — Progress Notes (Signed)
73 yo female here for follow up.  Had left mastectomy in 04/2013.  Doing well.  Cancer was localized.  Dr. Rush Farmer was her surgeon.  Neg lymph nodes.  Did not need any chemotherapy, radiation or tamoxifen.  Sees Dr. Jana Hakim.  HTN-   On Diovan 80-12.5 mg daily.  Denies any HA, blurred vision, SOB or LE edema.   COPD- Using Advair daily and proventil only as needed for wheezing or chest tightness. No recent exacerbations.  Still smoking.  Has never had a pneumovax.  Tobacco abuse- smokes 4-5 cigs/day.  She is ready to try quit smoking after our counseling discussion today.  CKD- stage III.Marland Kitchen  Sees Dr. Florene Glen, last saw him in July 2014.   Cr stable.   Lab Results  Component Value Date   CREATININE 1.64* 04/19/2013   Due for preventative labs.  Fees  Lab Results  Component Value Date   CHOL 160 11/28/2012   HDL 53.00 11/28/2012   LDLCALC 91 11/28/2012   LDLDIRECT 94.8 04/24/2010   TRIG 82.0 11/28/2012   CHOLHDL 3 11/28/2012   Lab Results  Component Value Date   WBC 8.2 03/08/2013   HGB 13.1 04/21/2013   HCT 37.0 03/08/2013   MCV 88.5 03/08/2013   PLT 287 03/08/2013     Patient Active Problem List   Diagnosis Date Noted  . Breast cancer of upper-outer quadrant of left female breast 03/06/2013  . UTI (urinary tract infection) 01/31/2013  . COPD (chronic obstructive pulmonary disease) 06/09/2012  . Adjustment disorder 11/25/2010  . CHRONIC KIDNEY DISEASE STAGE III (MODERATE) 11/13/2009  . WHEEZING 12/13/2007  . DEPRESSION 05/04/2007  . TOBACCO ABUSE 05/03/2007  . ASTHMA 05/03/2007  . HYPERTENSION, BENIGN ESSENTIAL 12/06/2006  . ALLERGIC  RHINITIS 12/06/2006   Past Medical History  Diagnosis Date  . Asthma   . Hypertension   . Anxiety   . COPD (chronic obstructive pulmonary disease)   . Wears glasses   . Wears dentures     top and partial bottom  . HOH (hard of hearing)   . Arthritis    Past Surgical History  Procedure Laterality Date  . Appendectomy    . Other  surgical history      cysts removed from foot  . Other surgical history      cysts removed from breast  . Breast surgery  1985    cyst rt br  . Tubal ligation    . Mastectomy w/ sentinel node biopsy Left 04/21/2013    Procedure: MASTECTOMY WITH SENTINEL LYMPH NODE BIOPSY;  Surgeon: Harl Bowie, MD;  Location: [place];  Service: General;  Laterality: Left;  . Breast lumpectomy with needle localization Right 04/21/2013    Procedure: BREAST LUMPECTOMY WITH NEEDLE LOCALIZATION;  Surgeon: Harl Bowie, MD;  Location: [place];  Service: General;  Laterality: Right;   History  Substance Use Topics  . Smoking status: Current Every Day Smoker -- 0.50 packs/day    Types: Cigarettes    Last Attempt to Quit: 07/20/2004  . Smokeless tobacco: Not on file  . Alcohol Use: No   No family history on file. No Known Allergies Current Outpatient Prescriptions on File Prior to Visit  Medication Sig Dispense Refill  . ADVAIR DISKUS 100-50 MCG/DOSE AEPB INHALE 1 PUFF INTO THE LUNGS 2 (TWO) TIMES DAILY.  60 each  5  . albuterol (PROAIR HFA) 108 (90 BASE) MCG/ACT inhaler Inhale 1-2 puffs into the lungs every 4 (four) hours as  needed.  8.5 each  5  . busPIRone (BUSPAR) 15 MG tablet Take 1 tablet (15 mg total) by mouth 2 (two) times daily.  60 tablet  11  . CALCIUM PO Take by mouth.      . Cholecalciferol (VITAMIN D-3 PO) Take by mouth.      . fexofenadine (ALLEGRA) 180 MG tablet Take 180 mg by mouth as directed.        . Multiple Vitamin (MULTIVITAMIN) tablet Take 1 tablet by mouth daily.        . Omega-3 Fatty Acids (OMEGA 3 PO) Take by mouth.      Marland Kitchen UNABLE TO FIND Rx: L8000- Post Surgical Bras (Quantity: 6)\ Dx: 174.9; Left mastectomy  1 each  0  . valsartan-hydrochlorothiazide (DIOVAN-HCT) 80-12.5 MG per tablet TAKE 1 TABLET BY MOUTH DAILY.  30 tablet  5   No current facility-administered medications on file prior to visit.   The PMH, PSH, Social History,  Family History, Medications, and allergies have been reviewed in [facility], and have been updated if relevant.  ROS:  Appetite good- weight stable Wt Readings from Last 3 Encounters:  10/26/13 127 lb 8 oz (57.834 kg)  05/29/13 123 lb 6.4 oz (55.974 kg)  05/24/13 126 lb 11.2 oz (57.471 kg)   Physical exam:  BP 138/70  Pulse 83  Temp(Src) 97.5 F (36.4 C) (Oral)  Ht 5' 5.25" (1.657 m)  Wt 127 lb 8 oz (57.834 kg)  BMI 21.06 kg/m2  SpO2 98%  General: alert, well-developed, well-nourished, and well-hydrated. NAD  Ears: R ear normal and L ear normal.  Nose: External nasal examination shows no deformity or inflammation. Nasal mucosa are pink and moist without lesions or exudates.  Mouth: Oral mucosa and oropharynx without lesions or exudates. Teeth in good repair.  Lungs: normal respiratory effort, no intercostal retractions, no accessory muscle use, and normal breath sounds.  Heart: normal rate, regular rhythm, and no murmur.  Extremities: no edema either lower leg  Psych: normally interactive, flat affect, and subdued.

## 2013-10-26 NOTE — Patient Instructions (Addendum)
Good to see you. We will call you with your lab results.  I'm proud of you for being ready to quit.  We are starting Zyban twice dailiy.

## 2013-10-26 NOTE — Assessment & Plan Note (Signed)
Ready to quit! Discussed tx options. Advised to pick a quit date. She would like to try Zyban- eRx sent to pharmacy.

## 2013-10-26 NOTE — Assessment & Plan Note (Signed)
Well controlled. No changes. 

## 2013-10-26 NOTE — Assessment & Plan Note (Signed)
S/p mastectomy. Did not require further therapy/tx.

## 2013-10-26 NOTE — Assessment & Plan Note (Signed)
Lungs clear on exam. No changes. Pneumovax given today. She is ready to quit smoking!

## 2013-12-27 ENCOUNTER — Other Ambulatory Visit: Payer: Self-pay | Admitting: Family Medicine

## 2014-01-08 ENCOUNTER — Other Ambulatory Visit: Payer: Self-pay | Admitting: Family Medicine

## 2014-01-09 ENCOUNTER — Other Ambulatory Visit: Payer: Self-pay | Admitting: Family Medicine

## 2014-03-17 ENCOUNTER — Other Ambulatory Visit: Payer: Self-pay | Admitting: Family Medicine

## 2014-03-22 ENCOUNTER — Encounter: Payer: Self-pay | Admitting: Surgery

## 2014-05-01 ENCOUNTER — Other Ambulatory Visit: Payer: Self-pay | Admitting: *Deleted

## 2014-05-01 MED ORDER — BUSPIRONE HCL 15 MG PO TABS: ORAL_TABLET | ORAL | Status: DC

## 2014-05-28 ENCOUNTER — Encounter: Payer: Self-pay | Admitting: Family Medicine

## 2014-05-28 ENCOUNTER — Ambulatory Visit (INDEPENDENT_AMBULATORY_CARE_PROVIDER_SITE_OTHER): Payer: Medicare Other | Admitting: Family Medicine

## 2014-05-28 VITALS — BP 122/68 | HR 87 | Temp 97.7°F | Wt 127.5 lb

## 2014-05-28 DIAGNOSIS — Z720 Tobacco use: Secondary | ICD-10-CM

## 2014-05-28 DIAGNOSIS — J449 Chronic obstructive pulmonary disease, unspecified: Secondary | ICD-10-CM

## 2014-05-28 DIAGNOSIS — I10 Essential (primary) hypertension: Secondary | ICD-10-CM

## 2014-05-28 DIAGNOSIS — N183 Chronic kidney disease, stage 3 unspecified: Secondary | ICD-10-CM

## 2014-05-28 MED ORDER — BUSPIRONE HCL 15 MG PO TABS: ORAL_TABLET | ORAL | Status: DC

## 2014-05-28 NOTE — Progress Notes (Signed)
73 yo female here for follow up.  Breast CA- s/p left mastectomy in 04/2013.  Doing well.  Cancer was localized.  Dr. Rush Farmer was her surgeon.  Neg lymph nodes.  Did not need any chemotherapy, radiation or tamoxifen.  Sees Dr. Jana Hakim.  HTN- On Diovan 80-12.5 mg daily.  Denies any HA, blurred vision, SOB or LE edema.   COPD- Using Advair daily and proventil prn (has not needed to use it recently). No recent exacerbations.  Still smoking.  Received influenza vaccine Also received prevnar 13 here in 10/2013.  Tobacco abuse- smokes 4-5 cigs/day. She is still not quite ready to quit.  CKD- stage III.Marland Kitchen  Sees Dr. Florene Glen, last saw him in July 2014.   Cr stable.   Lab Results  Component Value Date   CREATININE 1.4* 10/26/2013   Lab Results  Component Value Date   CHOL 158 10/26/2013   HDL 60.30 10/26/2013   LDLCALC 85 10/26/2013   LDLDIRECT 94.8 04/24/2010   TRIG 62.0 10/26/2013   CHOLHDL 3 10/26/2013     Patient Active Problem List   Diagnosis Date Noted  . Tobacco abuse 10/26/2013  . Breast cancer of upper-outer quadrant of left female breast 03/06/2013  . COPD (chronic obstructive pulmonary disease) 06/09/2012  . Adjustment disorder 11/25/2010  . CHRONIC KIDNEY DISEASE STAGE III (MODERATE) 11/13/2009  . DEPRESSION 05/04/2007  . TOBACCO ABUSE 05/03/2007  . ASTHMA 05/03/2007  . HYPERTENSION, BENIGN ESSENTIAL 12/06/2006  . ALLERGIC  RHINITIS 12/06/2006   Past Medical History  Diagnosis Date  . Asthma   . Hypertension   . Anxiety   . COPD (chronic obstructive pulmonary disease)   . Wears glasses   . Wears dentures     top and partial bottom  . HOH (hard of hearing)   . Arthritis    Past Surgical History  Procedure Laterality Date  . Appendectomy    . Other surgical history      cysts removed from foot  . Other surgical history      cysts removed from breast  . Breast surgery  1985    cyst rt br  . Tubal ligation    . Mastectomy w/ sentinel node biopsy Left  04/21/2013    Procedure: MASTECTOMY WITH SENTINEL LYMPH NODE BIOPSY;  Surgeon: Harl Bowie, MD;  Location: [facility];  Service: General;  Laterality: Left;  . Breast lumpectomy with needle localization Right 04/21/2013    Procedure: BREAST LUMPECTOMY WITH NEEDLE LOCALIZATION;  Surgeon: Harl Bowie, MD;  Location: [facility];  Service: General;  Laterality: Right;   History  Substance Use Topics  . Smoking status: Current Every Day Smoker -- 0.50 packs/day    Types: Cigarettes    Last Attempt to Quit: 07/20/2004  . Smokeless tobacco: Not on file  . Alcohol Use: No   No family history on file. No Known Allergies Current Outpatient Prescriptions on File Prior to Visit  Medication Sig Dispense Refill  . ADVAIR DISKUS 100-50 MCG/DOSE AEPB INHALE 1 PUFF INTO THE LUNGS 2 (TWO) TIMES DAILY. 60 each 5  . albuterol (PROAIR HFA) 108 (90 BASE) MCG/ACT inhaler Inhale 1-2 puffs into the lungs every 4 (four) hours as needed. 8.5 each 5  . buPROPion (WELLBUTRIN SR) 150 MG 12 hr tablet TAKE 1 TABLET (150 MG TOTAL) BY MOUTH 2 (TWO) TIMES DAILY. 60 tablet 1  . CALCIUM PO Take by mouth.    . Cholecalciferol (VITAMIN D-3 PO) Take by mouth.    Marland Kitchen  fexofenadine (ALLEGRA) 180 MG tablet Take 180 mg by mouth as directed.      . Multiple Vitamin (MULTIVITAMIN) tablet Take 1 tablet by mouth daily.      . Omega-3 Fatty Acids (OMEGA 3 PO) Take by mouth.    Marland Kitchen UNABLE TO FIND Rx: L8000- Post Surgical Bras (Quantity: 6)\ Dx: 174.9; Left mastectomy 1 each 0  . valsartan-hydrochlorothiazide (DIOVAN-HCT) 80-12.5 MG per tablet TAKE 1 TABLET BY MOUTH DAILY. 30 tablet 2   No current facility-administered medications on file prior to visit.   The PMH, PSH, Social History, Family History, Medications, and allergies have been reviewed in [facility], and have been updated if relevant.  ROS: Denies feeling depressed or anxious- husband cancer has been in remission Appetite good- weight  stable Wt Readings from Last 3 Encounters:  05/28/14 127 lb 8 oz (57.834 kg)  10/26/13 127 lb 8 oz (57.834 kg)  05/29/13 123 lb 6.4 oz (55.974 kg)   Physical exam:  BP 122/68 mmHg  Pulse 87  Temp(Src) 97.7 F (36.5 C) (Oral)  Wt 127 lb 8 oz (57.834 kg)  SpO2 97%  General: alert, well-developed, well-nourished, and well-hydrated. NAD  Ears: R ear normal and L ear normal.  Nose: External nasal examination shows no deformity or inflammation. Nasal mucosa are pink and moist without lesions or exudates.  Mouth: Oral mucosa and oropharynx without lesions or exudates. Teeth in good repair.  Lungs: normal respiratory effort, no intercostal retractions, no accessory muscle use, and normal breath sounds.  Heart: normal rate, regular rhythm, and no murmur.  Extremities: no edema either lower leg  Psych: normally interactive, flat affect, and subdued.

## 2014-05-28 NOTE — Assessment & Plan Note (Signed)
Stable, no recent exacerbations.

## 2014-05-28 NOTE — Assessment & Plan Note (Signed)
Stable

## 2014-05-28 NOTE — Assessment & Plan Note (Signed)
Not ready to quit.  

## 2014-05-28 NOTE — Assessment & Plan Note (Signed)
Well controlled. No changes made today. 

## 2014-05-28 NOTE — Progress Notes (Signed)
Pre visit review using our clinic review tool, if applicable. No additional management support is needed unless otherwise documented below in the visit note. 

## 2014-06-26 ENCOUNTER — Other Ambulatory Visit: Payer: Self-pay | Admitting: *Deleted

## 2014-06-26 MED ORDER — FLUTICASONE-SALMETEROL 100-50 MCG/DOSE IN AEPB: INHALATION_SPRAY | RESPIRATORY_TRACT | Status: DC

## 2014-08-04 ENCOUNTER — Other Ambulatory Visit: Payer: Self-pay | Admitting: Family Medicine

## 2014-11-14 ENCOUNTER — Other Ambulatory Visit: Payer: Self-pay | Admitting: Family Medicine

## 2014-12-03 ENCOUNTER — Ambulatory Visit (INDEPENDENT_AMBULATORY_CARE_PROVIDER_SITE_OTHER): Payer: Medicare Other | Admitting: Family Medicine

## 2014-12-03 ENCOUNTER — Encounter: Payer: Self-pay | Admitting: *Deleted

## 2014-12-03 ENCOUNTER — Encounter: Payer: Self-pay | Admitting: Family Medicine

## 2014-12-03 VITALS — BP 144/80 | HR 88 | Temp 97.6°F | Wt 126.2 lb

## 2014-12-03 DIAGNOSIS — J449 Chronic obstructive pulmonary disease, unspecified: Secondary | ICD-10-CM | POA: Diagnosis not present

## 2014-12-03 DIAGNOSIS — Z23 Encounter for immunization: Secondary | ICD-10-CM | POA: Diagnosis not present

## 2014-12-03 DIAGNOSIS — Z720 Tobacco use: Secondary | ICD-10-CM | POA: Diagnosis not present

## 2014-12-03 DIAGNOSIS — N183 Chronic kidney disease, stage 3 unspecified: Secondary | ICD-10-CM

## 2014-12-03 DIAGNOSIS — C50412 Malignant neoplasm of upper-outer quadrant of left female breast: Secondary | ICD-10-CM

## 2014-12-03 DIAGNOSIS — I10 Essential (primary) hypertension: Secondary | ICD-10-CM | POA: Diagnosis not present

## 2014-12-03 LAB — TSH: TSH: 2.19 u[IU]/mL (ref 0.35–4.50)

## 2014-12-03 LAB — COMPREHENSIVE METABOLIC PANEL
ALT: 12 U/L (ref 0–35)
AST: 19 U/L (ref 0–37)
Albumin: 3.8 g/dL (ref 3.5–5.2)
Alkaline Phosphatase: 69 U/L (ref 39–117)
BUN: 17 mg/dL (ref 6–23)
CALCIUM: 9.6 mg/dL (ref 8.4–10.5)
CO2: 29 mEq/L (ref 19–32)
CREATININE: 1.4 mg/dL — AB (ref 0.40–1.20)
Chloride: 103 mEq/L (ref 96–112)
GFR: 38.88 mL/min — ABNORMAL LOW (ref >60.00)
Glucose, Bld: 93 mg/dL (ref 70–99)
Potassium: 4.3 mEq/L (ref 3.5–5.1)
Sodium: 137 mEq/L (ref 135–145)
TOTAL PROTEIN: 6.6 g/dL (ref 6.0–8.3)
Total Bilirubin: 0.4 mg/dL (ref 0.2–1.2)

## 2014-12-03 LAB — LIPID PANEL
CHOL/HDL RATIO: 3
Cholesterol: 152 mg/dL (ref 0–200)
HDL: 55.3 mg/dL (ref >39.00)
LDL CALC: 81 mg/dL (ref 0–99)
NonHDL: 96.7
Triglycerides: 79 mg/dL (ref 0.0–149.0)
VLDL: 15.8 mg/dL (ref 0.0–40.0)

## 2014-12-03 LAB — CBC WITH DIFFERENTIAL/PLATELET
BASOS PCT: 0.5 % (ref 0.0–3.0)
Basophils Absolute: 0 10*3/uL (ref 0.0–0.1)
EOS PCT: 1.2 % (ref 0.0–5.0)
Eosinophils Absolute: 0.1 10*3/uL (ref 0.0–0.7)
HEMATOCRIT: 37.4 % (ref 36.0–46.0)
HEMOGLOBIN: 12.6 g/dL (ref 12.0–15.0)
LYMPHS ABS: 2.5 10*3/uL (ref 0.7–4.0)
Lymphocytes Relative: 34.3 % (ref 12.0–46.0)
MCHC: 33.8 g/dL (ref 30.0–36.0)
MCV: 87.7 fl (ref 78.0–100.0)
MONO ABS: 0.6 10*3/uL (ref 0.1–1.0)
Monocytes Relative: 8.5 % (ref 3.0–12.0)
Neutro Abs: 4 10*3/uL (ref 1.4–7.7)
Neutrophils Relative %: 55.5 % (ref 43.0–77.0)
Platelets: 291 10*3/uL (ref 150.0–400.0)
RBC: 4.27 Mil/uL (ref 3.87–5.11)
RDW: 14.8 % (ref 11.5–15.5)
WBC: 7.2 10*3/uL (ref 4.0–10.5)

## 2014-12-03 MED ORDER — VALSARTAN 40 MG PO TABS: 40.0000 mg | ORAL_TABLET | Freq: Every day | ORAL | Status: DC

## 2014-12-03 NOTE — Patient Instructions (Signed)
Good to see you. We are changing your blood pressure medication to Valsartan 40 mg daily. Please keep an eye on your blood pressure for me.

## 2014-12-03 NOTE — Progress Notes (Signed)
74 yo female here for follow up.  Breast CA- s/p left mastectomy in 04/2013.  Doing well.  Cancer was localized.  Dr. Rush Farmer was her surgeon.  Neg lymph nodes.  Did not need any chemotherapy, radiation or tamoxifen.  Sees Dr. Jana Hakim.  HTN- On Diovan 80-12.5 mg daily.  BP elevated today because she didn't take her rx.  Has been cutting her pill into fourths because if she takes a whole pill "she will fall out."  Makes her dizzy and presyncopal.   Denies any HA, blurred vision, SOB or LE edema.  Lab Results  Component Value Date   CREATININE 1.4* 10/26/2013    COPD- Using Advair daily and proventil prn. No recent exacerbations.  Still smoking.  Received influenza vaccine Also received prevnar 13 here in 10/2013.  Tobacco abuse- smokes 4-5 cigs/day. She is still not quite ready to quit.  CKD- stage III.Marland Kitchen  Sees Dr. Florene Glen, last saw him in July 2014.   Cr stable.   Lab Results  Component Value Date   CREATININE 1.4* 10/26/2013   Lab Results  Component Value Date   CHOL 158 10/26/2013   HDL 60.30 10/26/2013   LDLCALC 85 10/26/2013   LDLDIRECT 94.8 04/24/2010   TRIG 62.0 10/26/2013   CHOLHDL 3 10/26/2013     Patient Active Problem List   Diagnosis Date Noted  . Tobacco abuse 10/26/2013  . Breast cancer of upper-outer quadrant of left female breast 03/06/2013  . COPD (chronic obstructive pulmonary disease) 06/09/2012  . Adjustment disorder 11/25/2010  . CHRONIC KIDNEY DISEASE STAGE III (MODERATE) 11/13/2009  . DEPRESSION 05/04/2007  . ASTHMA 05/03/2007  . HYPERTENSION, BENIGN ESSENTIAL 12/06/2006  . ALLERGIC  RHINITIS 12/06/2006   Past Medical History  Diagnosis Date  . Asthma   . Hypertension   . Anxiety   . COPD (chronic obstructive pulmonary disease)   . Wears glasses   . Wears dentures     top and partial bottom  . HOH (hard of hearing)   . Arthritis    Past Surgical History  Procedure Laterality Date  . Appendectomy    . Other surgical history      cysts  removed from foot  . Other surgical history      cysts removed from breast  . Breast surgery  1985    cyst rt br  . Tubal ligation    . Mastectomy w/ sentinel node biopsy Left 04/21/2013    Procedure: MASTECTOMY WITH SENTINEL LYMPH NODE BIOPSY;  Surgeon: Harl Bowie, MD;  Location: [place];  Service: General;  Laterality: Left;  . Breast lumpectomy with needle localization Right 04/21/2013    Procedure: BREAST LUMPECTOMY WITH NEEDLE LOCALIZATION;  Surgeon: Harl Bowie, MD;  Location: [place];  Service: General;  Laterality: Right;   History  Substance Use Topics  . Smoking status: Current Every Day Smoker -- 0.50 packs/day    Types: Cigarettes    Last Attempt to Quit: 07/20/2004  . Smokeless tobacco: Not on file  . Alcohol Use: No   History reviewed. No pertinent family history. No Known Allergies Current Outpatient Prescriptions on File Prior to Visit  Medication Sig Dispense Refill  . albuterol (PROAIR HFA) 108 (90 BASE) MCG/ACT inhaler Inhale 1-2 puffs into the lungs every 4 (four) hours as needed. 8.5 each 5  . buPROPion (WELLBUTRIN SR) 150 MG 12 hr tablet TAKE 1 TABLET (150 MG TOTAL) BY MOUTH 2 (TWO) TIMES DAILY. 60 tablet 1  .  busPIRone (BUSPAR) 15 MG tablet TAKE 1 TABLET BY MOUTH TWICE A DAY 60 tablet 2  . CALCIUM PO Take by mouth.    . Cholecalciferol (VITAMIN D-3 PO) Take by mouth.    . fexofenadine (ALLEGRA) 180 MG tablet Take 180 mg by mouth as directed.      . Fluticasone-Salmeterol (ADVAIR DISKUS) 100-50 MCG/DOSE AEPB INHALE 1 PUFF INTO THE LUNGS 2 (TWO) TIMES DAILY. 60 each 5  . Multiple Vitamin (MULTIVITAMIN) tablet Take 1 tablet by mouth daily.      . Omega-3 Fatty Acids (OMEGA 3 PO) Take by mouth.    Marland Kitchen UNABLE TO FIND Rx: L8000- Post Surgical Bras (Quantity: 6)\ Dx: 174.9; Left mastectomy 1 each 0  . valsartan-hydrochlorothiazide (DIOVAN-HCT) 80-12.5 MG per tablet TAKE 1 TABLET BY MOUTH DAILY. 30 tablet 2   No current  facility-administered medications on file prior to visit.   The PMH, PSH, Social History, Family History, Medications, and allergies have been reviewed in [facility], and have been updated if relevant.  ROS: Review of Systems  Constitutional: Negative.   HENT: Negative.   Eyes: Negative.   Respiratory: Negative.   Cardiovascular: Negative.   Gastrointestinal: Negative.   Endocrine: Negative.   Genitourinary: Negative.   Musculoskeletal: Negative.   Skin: Negative.   Allergic/Immunologic: Negative.   Neurological: Positive for light-headedness.  Hematological: Negative.   Psychiatric/Behavioral: Negative.   All other systems reviewed and are negative.   Appetite good- weight stable Wt Readings from Last 3 Encounters:  12/03/14 126 lb 4 oz (57.267 kg)  05/28/14 127 lb 8 oz (57.834 kg)  10/26/13 127 lb 8 oz (57.834 kg)   Physical exam: BP 144/80 mmHg  Pulse 88  Temp(Src) 97.6 F (36.4 C) (Oral)  Wt 126 lb 4 oz (57.267 kg)  SpO2 97%  BP 144/80 mmHg  Pulse 88  Temp(Src) 97.6 F (36.4 C) (Oral)  Wt 126 lb 4 oz (57.267 kg)  SpO2 97%  Physical Exam  Constitutional: She is oriented to person, place, and time and well-developed, well-nourished, and in no distress. No distress.  HENT:  Head: Normocephalic and atraumatic.  Eyes: Conjunctivae are normal.  Neck: Normal range of motion.  Cardiovascular: Normal rate and regular rhythm.   Pulmonary/Chest: Effort normal and breath sounds normal. No respiratory distress. She has no wheezes. She has no rales. She exhibits no tenderness.  Musculoskeletal: Normal range of motion. She exhibits no edema.  Neurological: She is alert and oriented to person, place, and time.  Skin: Skin is warm and dry. She is not diaphoretic.  Psychiatric: Mood, memory, affect and judgment normal.  Nursing note and vitals reviewed.

## 2014-12-03 NOTE — Assessment & Plan Note (Signed)
Deteriorated because she is not taking rx due to hypotension. D/c diovan hctz 80-12.5 daily. eRx sent for diovan 40 mg daily. She will check BPs at home and keep me updated. The patient indicates understanding of these issues and agrees with the plan.

## 2014-12-03 NOTE — Progress Notes (Signed)
Pre visit review using our clinic review tool, if applicable. No additional management support is needed unless otherwise documented below in the visit note. 

## 2014-12-03 NOTE — Addendum Note (Signed)
Addended by: Modena Nunnery on: 12/03/2014 11:10 AM   Modules accepted: Orders

## 2014-12-03 NOTE — Assessment & Plan Note (Signed)
No recent exacerbations. Continue daily advair with prn proair. The patient indicates understanding of these issues and agrees with the plan.

## 2014-12-03 NOTE — Assessment & Plan Note (Signed)
Followed by renal. Due for labs today. Pneumovax given today.

## 2014-12-21 ENCOUNTER — Other Ambulatory Visit: Payer: Self-pay | Admitting: Family Medicine

## 2015-01-02 ENCOUNTER — Telehealth: Payer: Self-pay | Admitting: Family Medicine

## 2015-01-02 NOTE — Telephone Encounter (Signed)
Patient called to let Dr.Aron know the blood pressure medication she was put on is working well.

## 2015-01-02 NOTE — Telephone Encounter (Signed)
Wonderful.  Thanks for the update.

## 2015-03-26 ENCOUNTER — Other Ambulatory Visit: Payer: Self-pay | Admitting: Family Medicine

## 2015-06-06 ENCOUNTER — Encounter: Payer: Medicare Other | Admitting: Family Medicine

## 2015-06-17 ENCOUNTER — Ambulatory Visit (INDEPENDENT_AMBULATORY_CARE_PROVIDER_SITE_OTHER): Payer: Medicare Other | Admitting: Family Medicine

## 2015-06-17 ENCOUNTER — Encounter: Payer: Self-pay | Admitting: Family Medicine

## 2015-06-17 VITALS — BP 146/76 | HR 83 | Temp 98.1°F | Ht 66.0 in | Wt 125.0 lb

## 2015-06-17 DIAGNOSIS — J449 Chronic obstructive pulmonary disease, unspecified: Secondary | ICD-10-CM

## 2015-06-17 DIAGNOSIS — F32A Depression, unspecified: Secondary | ICD-10-CM

## 2015-06-17 DIAGNOSIS — N183 Chronic kidney disease, stage 3 unspecified: Secondary | ICD-10-CM

## 2015-06-17 DIAGNOSIS — I10 Essential (primary) hypertension: Secondary | ICD-10-CM | POA: Diagnosis not present

## 2015-06-17 DIAGNOSIS — F329 Major depressive disorder, single episode, unspecified: Secondary | ICD-10-CM

## 2015-06-17 DIAGNOSIS — Z0000 Encounter for general adult medical examination without abnormal findings: Secondary | ICD-10-CM | POA: Diagnosis not present

## 2015-06-17 DIAGNOSIS — C50412 Malignant neoplasm of upper-outer quadrant of left female breast: Secondary | ICD-10-CM

## 2015-06-17 DIAGNOSIS — Z720 Tobacco use: Secondary | ICD-10-CM | POA: Diagnosis not present

## 2015-06-17 LAB — COMPREHENSIVE METABOLIC PANEL
ALK PHOS: 65 U/L (ref 39–117)
ALT: 12 U/L (ref 0–35)
AST: 19 U/L (ref 0–37)
Albumin: 3.9 g/dL (ref 3.5–5.2)
BILIRUBIN TOTAL: 0.4 mg/dL (ref 0.2–1.2)
BUN: 15 mg/dL (ref 6–23)
CO2: 28 mEq/L (ref 19–32)
CREATININE: 1.43 mg/dL — AB (ref 0.40–1.20)
Calcium: 9.4 mg/dL (ref 8.4–10.5)
Chloride: 106 mEq/L (ref 96–112)
GFR: 37.89 mL/min — AB (ref >60.00)
GLUCOSE: 81 mg/dL (ref 70–99)
Potassium: 4.4 mEq/L (ref 3.5–5.1)
SODIUM: 141 meq/L (ref 135–145)
TOTAL PROTEIN: 6.2 g/dL (ref 6.0–8.3)

## 2015-06-17 LAB — CBC WITH DIFFERENTIAL/PLATELET
BASOS PCT: 0.4 % (ref 0.0–3.0)
Basophils Absolute: 0 10*3/uL (ref 0.0–0.1)
EOS PCT: 2 % (ref 0.0–5.0)
Eosinophils Absolute: 0.2 10*3/uL (ref 0.0–0.7)
HEMATOCRIT: 37.5 % (ref 36.0–46.0)
HEMOGLOBIN: 12.2 g/dL (ref 12.0–15.0)
LYMPHS PCT: 33.4 % (ref 12.0–46.0)
Lymphs Abs: 2.8 10*3/uL (ref 0.7–4.0)
MCHC: 32.6 g/dL (ref 30.0–36.0)
MCV: 90 fl (ref 78.0–100.0)
MONO ABS: 0.7 10*3/uL (ref 0.1–1.0)
Monocytes Relative: 8 % (ref 3.0–12.0)
Neutro Abs: 4.7 10*3/uL (ref 1.4–7.7)
Neutrophils Relative %: 56.2 % (ref 43.0–77.0)
Platelets: 293 10*3/uL (ref 150.0–400.0)
RBC: 4.17 Mil/uL (ref 3.87–5.11)
RDW: 15 % (ref 11.5–15.5)
WBC: 8.3 10*3/uL (ref 4.0–10.5)

## 2015-06-17 LAB — TSH: TSH: 3.21 u[IU]/mL (ref 0.35–4.50)

## 2015-06-17 LAB — LIPID PANEL
CHOL/HDL RATIO: 3
Cholesterol: 155 mg/dL (ref 0–200)
HDL: 51.3 mg/dL (ref >39.00)
LDL Cholesterol: 88 mg/dL (ref 0–99)
NONHDL: 104.08
Triglycerides: 80 mg/dL (ref 0.0–149.0)
VLDL: 16 mg/dL (ref 0.0–40.0)

## 2015-06-17 MED ORDER — FLUTICASONE-SALMETEROL 100-50 MCG/DOSE IN AEPB: INHALATION_SPRAY | RESPIRATORY_TRACT | Status: DC

## 2015-06-17 MED ORDER — ALBUTEROL SULFATE HFA 108 (90 BASE) MCG/ACT IN AERS: 1.0000 | INHALATION_SPRAY | RESPIRATORY_TRACT | Status: DC | PRN

## 2015-06-17 NOTE — Patient Instructions (Signed)
Great to see you. Happy Holidays.   

## 2015-06-17 NOTE — Progress Notes (Signed)
Pre visit review using our clinic review tool, if applicable. No additional management support is needed unless otherwise documented below in the visit note. 

## 2015-06-17 NOTE — Assessment & Plan Note (Signed)
No recent exacerbations. eRxs refilled. No changes made today.

## 2015-06-17 NOTE — Assessment & Plan Note (Signed)
Not ready to quit.  

## 2015-06-17 NOTE — Addendum Note (Signed)
Addended by: Ellamae Sia on: 06/17/2015 03:36 PM   Modules accepted: Orders

## 2015-06-17 NOTE — Assessment & Plan Note (Signed)
Followed by onc and surgery. Doing well. Obs only now.

## 2015-06-17 NOTE — Addendum Note (Signed)
Addended by: Lucille Passy on: 06/17/2015 01:11 PM   Modules accepted: Orders, SmartSet

## 2015-06-17 NOTE — Assessment & Plan Note (Signed)
The patients weight, height, BMI and visual acuity have been recorded in the chart.  Cognitive function assessed.   I have made referrals, counseling and provided education to the patient based review of the above and I have provided the pt with a written personalized care plan for preventive services.  

## 2015-06-17 NOTE — Progress Notes (Signed)
74 yo female here annual medicare wellness visit and for follow up.  I have personally reviewed the Medicare Annual Wellness questionnaire and have noted 1. The patient's medical and social history 2. Their use of alcohol, tobacco or illicit drugs 3. Their current medications and supplements 4. The patient's functional ability including ADL's, fall risks, home safety risks and hearing or visual             impairment. 5. Diet and physical activities 6. Evidence for depression or mood disorders  End of life wishes discussed and updated in Social History.  The roster of all physicians providing medical care to patient - is listed in the Care[employer]ams section of the chart.  prevnar 13 10/26/13 Pneumovax 12/03/14 Td 12/25/08 Zoster 10/06/08   Breast CA- s/p left mastectomy in 04/2013.  Doing well.  Cancer was localized.  Dr. Rush Farmer was her surgeon- saw him this month, advised 1 year follow up.  Neg lymph nodes.  Did not need any chemotherapy, radiation or tamoxifen.  Sees Dr. Jana Hakim.  HTN- On Diovan 80-12.5 mg daily.  Denies any HA, blurred vision, SOB or LE edema.   COPD- Using Advair daily and proventil prn (has not needed to use it recently). Needs refills on her inhalers.  No recent exacerbations.  Still smoking.  [facility]  Pneumooccal vaccines UTD.  Also received prevnar 13 here in 10/2013.  Tobacco abuse- smokes 4-5 cigs/day. She is still not quite ready to quit.  CKD- stage III.Marland Kitchen  Sees Dr. Florene Glen, nephrology yearly.  Just saw him this month. Cr has been stable.   Lab Results  Component Value Date   CREATININE 1.40* 12/03/2014   Lab Results  Component Value Date   CHOL 152 12/03/2014   HDL 55.30 12/03/2014   LDLCALC 81 12/03/2014   LDLDIRECT 94.8 04/24/2010   TRIG 79.0 12/03/2014   CHOLHDL 3 12/03/2014     Patient Active Problem List   Diagnosis Date Noted  . Medicare annual wellness visit, subsequent 06/17/2015  . Tobacco abuse 10/26/2013   . Breast cancer of upper-outer quadrant of left female breast ([place]) 03/06/2013  . COPD (chronic obstructive pulmonary disease) ([place]) 06/09/2012  . Adjustment disorder 11/25/2010  . CHRONIC KIDNEY DISEASE STAGE III (MODERATE) 11/13/2009  . Depression 05/04/2007  . ASTHMA 05/03/2007  . HYPERTENSION, BENIGN ESSENTIAL 12/06/2006  . ALLERGIC  RHINITIS 12/06/2006   Past Medical History  Diagnosis Date  . Asthma   . Hypertension   . Anxiety   . COPD (chronic obstructive pulmonary disease) ([place])   . Wears glasses   . Wears dentures     top and partial bottom  . HOH (hard of hearing)   . Arthritis    Past Surgical History  Procedure Laterality Date  . Appendectomy    . Other surgical history      cysts removed from foot  . Other surgical history      cysts removed from breast  . Breast surgery  1985    cyst rt br  . Tubal ligation    . Mastectomy w/ sentinel node biopsy Left 04/21/2013    Procedure: MASTECTOMY WITH SENTINEL LYMPH NODE BIOPSY;  Surgeon: Harl Bowie, MD;  Location: [place];  Service: General;  Laterality: Left;  . Breast lumpectomy with needle localization Right 04/21/2013    Procedure: BREAST LUMPECTOMY WITH NEEDLE LOCALIZATION;  Surgeon: Harl Bowie, MD;  Location: [place];  Service: General;  Laterality: Right;  Social History  Substance Use Topics  . Smoking status: Current Every Day Smoker -- 0.50 packs/day    Types: Cigarettes    Last Attempt to Quit: 07/20/2004  . Smokeless tobacco: None  . Alcohol Use: No   No family history on file. No Known Allergies Current Outpatient Prescriptions on File Prior to Visit  Medication Sig Dispense Refill  . albuterol (PROAIR HFA) 108 (90 BASE) MCG/ACT inhaler Inhale 1-2 puffs into the lungs every 4 (four) hours as needed. 8.5 each 5  . buPROPion (WELLBUTRIN SR) 150 MG 12 hr tablet TAKE 1 TABLET (150 MG TOTAL) BY MOUTH 2 (TWO) TIMES DAILY. 60 tablet 1  . busPIRone  (BUSPAR) 15 MG tablet TAKE 1 TABLET BY MOUTH TWICE A DAY 60 tablet 2  . CALCIUM PO Take by mouth.    . Cholecalciferol (VITAMIN D-3 PO) Take by mouth.    . fexofenadine (ALLEGRA) 180 MG tablet Take 180 mg by mouth as directed.      . Multiple Vitamin (MULTIVITAMIN) tablet Take 1 tablet by mouth daily.      . Omega-3 Fatty Acids (OMEGA 3 PO) Take by mouth.    Marland Kitchen UNABLE TO FIND Rx: L8000- Post Surgical Bras (Quantity: 6)\ Dx: 174.9; Left mastectomy 1 each 0  . valsartan (DIOVAN) 40 MG tablet TAKE 1 TABLET (40 MG TOTAL) BY MOUTH DAILY. 30 tablet 3   No current facility-administered medications on file prior to visit.   The PMH, PSH, Social History, Family History, Medications, and allergies have been reviewed in [facility], and have been updated if relevant.  Review of Systems  Constitutional: Negative.   HENT: Negative.   Respiratory: Negative.   Cardiovascular: Negative.   Gastrointestinal: Negative.   Endocrine: Negative.   Genitourinary: Negative.   Musculoskeletal: Negative.   Skin: Negative.   Allergic/Immunologic: Negative.   Neurological: Negative.   Hematological: Negative.   Psychiatric/Behavioral: Negative.   All other systems reviewed and are negative.   Wt Readings from Last 3 Encounters:  06/17/15 125 lb (56.7 kg)  12/03/14 126 lb 4 oz (57.267 kg)  05/28/14 127 lb 8 oz (57.834 kg)   Physical exam:  BP 146/76 mmHg  Pulse 83  Temp(Src) 98.1 F (36.7 C) (Oral)  Ht '5\' 6"'$  (1.676 m)  Wt 125 lb (56.7 kg)  BMI 20.19 kg/m2  SpO2 99%  Physical Exam  Constitutional: She is well-developed, well-nourished, and in no distress. No distress.  HENT:  Head: Normocephalic.  Eyes: Conjunctivae are normal.  Neck: Normal range of motion.  Cardiovascular: Normal rate and regular rhythm.   Pulmonary/Chest: Effort normal and breath sounds normal. No respiratory distress.  Abdominal: Soft.  Musculoskeletal: Normal range of motion.  Neurological: She is alert.  Skin: Skin is warm  and dry. She is not diaphoretic.  Psychiatric: Memory, affect and judgment normal.  Nursing note and vitals reviewed.

## 2015-06-18 ENCOUNTER — Encounter: Payer: Self-pay | Admitting: *Deleted

## 2015-06-24 ENCOUNTER — Telehealth: Payer: Self-pay

## 2015-06-24 MED ORDER — BENZONATATE 100 MG PO CAPS: 100.0000 mg | ORAL_CAPSULE | Freq: Two times a day (BID) | ORAL | Status: DC | PRN

## 2015-06-24 NOTE — Telephone Encounter (Signed)
eRx sent for tessalon.  Any fevers?

## 2015-06-24 NOTE — Telephone Encounter (Signed)
Spoke to pt and advised. Pt has not had any fevers

## 2015-06-24 NOTE — Telephone Encounter (Signed)
Pt left v/m; pt was seen 06/17/15 and was advised if condition worsened to cb for medication. Pt now has more chest congestion and prod cough with white phlegm.No wheezing or SOB or fever. Pt not able to sleep due to cough. [facility]. Pt has not taken any OTC for cough. Pt request cb.

## 2015-07-11 ENCOUNTER — Other Ambulatory Visit: Payer: Self-pay | Admitting: *Deleted

## 2015-07-11 MED ORDER — BUSPIRONE HCL 15 MG PO TABS: 15.0000 mg | ORAL_TABLET | Freq: Two times a day (BID) | ORAL | Status: DC

## 2015-07-11 NOTE — Telephone Encounter (Signed)
Pt has not had Rx since 12/2014. Last f/u 05/2015-CPE

## 2015-07-18 ENCOUNTER — Other Ambulatory Visit: Payer: Self-pay | Admitting: *Deleted

## 2015-07-18 MED ORDER — VALSARTAN 40 MG PO TABS: ORAL_TABLET | ORAL | Status: DC

## 2015-07-23 ENCOUNTER — Other Ambulatory Visit: Payer: Self-pay | Admitting: *Deleted

## 2015-07-23 MED ORDER — VALSARTAN 40 MG PO TABS: ORAL_TABLET | ORAL | Status: DC

## 2015-10-09 ENCOUNTER — Other Ambulatory Visit: Payer: Self-pay | Admitting: Family Medicine

## 2015-11-21 ENCOUNTER — Other Ambulatory Visit: Payer: Self-pay | Admitting: Family Medicine

## 2015-12-23 ENCOUNTER — Ambulatory Visit (INDEPENDENT_AMBULATORY_CARE_PROVIDER_SITE_OTHER): Payer: Medicare Other | Admitting: Family Medicine

## 2015-12-23 ENCOUNTER — Encounter: Payer: Self-pay | Admitting: Family Medicine

## 2015-12-23 VITALS — BP 136/72 | HR 99 | Temp 98.6°F | Wt 118.5 lb

## 2015-12-23 DIAGNOSIS — F329 Major depressive disorder, single episode, unspecified: Secondary | ICD-10-CM

## 2015-12-23 DIAGNOSIS — C50412 Malignant neoplasm of upper-outer quadrant of left female breast: Secondary | ICD-10-CM | POA: Diagnosis not present

## 2015-12-23 DIAGNOSIS — F32A Depression, unspecified: Secondary | ICD-10-CM

## 2015-12-23 DIAGNOSIS — J438 Other emphysema: Secondary | ICD-10-CM

## 2015-12-23 DIAGNOSIS — N183 Chronic kidney disease, stage 3 unspecified: Secondary | ICD-10-CM

## 2015-12-23 DIAGNOSIS — R634 Abnormal weight loss: Secondary | ICD-10-CM

## 2015-12-23 DIAGNOSIS — I10 Essential (primary) hypertension: Secondary | ICD-10-CM

## 2015-12-23 NOTE — Progress Notes (Signed)
Pre visit review using our clinic review tool, if applicable. No additional management support is needed unless otherwise documented below in the visit note. 

## 2015-12-23 NOTE — Assessment & Plan Note (Signed)
Well controlled on current rx. No changes made today. 

## 2015-12-23 NOTE — Patient Instructions (Signed)
Good to see you. Try to eat more, even if it an ensure shake. Please.  Have a happy birthday!

## 2015-12-23 NOTE — Assessment & Plan Note (Signed)
Stable. Followed by nephrology.

## 2015-12-23 NOTE — Assessment & Plan Note (Signed)
She will work on supplementing her diet.

## 2015-12-23 NOTE — Progress Notes (Signed)
75 yo female here for follow up.   Has lost some weight- worried about her daughter who has been going through a divorce. Appetite is down but she does try to eat.  Wt Readings from Last 3 Encounters:  12/23/15 118 lb 8 oz (53.751 kg)  06/17/15 125 lb (56.7 kg)  12/03/14 126 lb 4 oz (57.267 kg)    Breast CA- s/p left mastectomy in 04/2013 Ninfa Linden).  Doing well.  Cancer was localized  Did not need any chemotherapy, radiation or tamoxifen.  Sees Dr. Jana Hakim yearly.  HTN- On Diovan 80-12.5 mg daily.  Denies any HA, blurred vision, SOB or LE edema. Lab Results  Component Value Date   CREATININE 1.43* 06/17/2015     COPD- Using Advair daily and proventil prn (has not needed to use it recently).   No recent exacerbations.  Still smoking.  Received influenza vaccine at [facility].  Pneumooccal vaccines UTD.  Tobacco abuse- smokes 4-5 cigs/day. She is still not quite ready to quit.  CKD- stage III. Sees Dr. Florene Glen, nephrology yearly.  Lab Results  Component Value Date   CREATININE 1.43* 06/17/2015   Lab Results  Component Value Date   CHOL 155 06/17/2015   HDL 51.30 06/17/2015   LDLCALC 88 06/17/2015   LDLDIRECT 94.8 04/24/2010   TRIG 80.0 06/17/2015   CHOLHDL 3 06/17/2015     Patient Active Problem List   Diagnosis Date Noted  . Tobacco abuse 10/26/2013  . Breast cancer of upper-outer quadrant of left female breast ([place]) 03/06/2013  . COPD (chronic obstructive pulmonary disease) ([place]) 06/09/2012  . Adjustment disorder 11/25/2010  . CHRONIC KIDNEY DISEASE STAGE III (MODERATE) 11/13/2009  . Depression 05/04/2007  . ASTHMA 05/03/2007  . HYPERTENSION, BENIGN ESSENTIAL 12/06/2006  . ALLERGIC  RHINITIS 12/06/2006   Past Medical History  Diagnosis Date  . Asthma   . Hypertension   . Anxiety   . COPD (chronic obstructive pulmonary disease) ([place])   . Wears glasses   . Wears dentures     top and partial bottom  . HOH (hard of hearing)   . Arthritis    Past Surgical  History  Procedure Laterality Date  . Appendectomy    . Other surgical history      cysts removed from foot  . Other surgical history      cysts removed from breast  . Breast surgery  1985    cyst rt br  . Tubal ligation    . Mastectomy w/ sentinel node biopsy Left 04/21/2013    Procedure: MASTECTOMY WITH SENTINEL LYMPH NODE BIOPSY;  Surgeon: Harl Bowie, MD;  Location: [place];  Service: General;  Laterality: Left;  . Breast lumpectomy with needle localization Right 04/21/2013    Procedure: BREAST LUMPECTOMY WITH NEEDLE LOCALIZATION;  Surgeon: Harl Bowie, MD;  Location: [facility];  Service: General;  Laterality: Right;   Social History  Substance Use Topics  . Smoking status: Current Every Day Smoker -- 0.50 packs/day    Types: Cigarettes    Last Attempt to Quit: 07/20/2004  . Smokeless tobacco: None  . Alcohol Use: No   No family history on file. No Known Allergies Current Outpatient Prescriptions on File Prior to Visit  Medication Sig Dispense Refill  . ADVAIR DISKUS 100-50 MCG/DOSE AEPB 1 PUFF TWICE DAILY 60 each 2  . albuterol (PROAIR HFA) 108 (90 BASE) MCG/ACT inhaler Inhale 1-2 puffs into the lungs every 4 (four) hours as needed. 8.5 each 5  .  buPROPion (WELLBUTRIN SR) 150 MG 12 hr tablet TAKE 1 TABLET (150 MG TOTAL) BY MOUTH 2 (TWO) TIMES DAILY. 60 tablet 1  . busPIRone (BUSPAR) 15 MG tablet TAKE 1 TABLET (15 MG TOTAL) BY MOUTH 2 (TWO) TIMES DAILY. 60 tablet 2  . CALCIUM PO Take by mouth.    . Cholecalciferol (VITAMIN D-3 PO) Take by mouth.    . fexofenadine (ALLEGRA) 180 MG tablet Take 180 mg by mouth as directed.      . Multiple Vitamin (MULTIVITAMIN) tablet Take 1 tablet by mouth daily.      . Omega-3 Fatty Acids (OMEGA 3 PO) Take by mouth.    Marland Kitchen UNABLE TO FIND Rx: L8000- Post Surgical Bras (Quantity: 6)\ Dx: 174.9; Left mastectomy 1 each 0  . valsartan (DIOVAN) 40 MG tablet TAKE 1 TABLET (40 MG TOTAL) BY MOUTH DAILY. 30  tablet 10   No current facility-administered medications on file prior to visit.   The PMH, PSH, Social History, Family History, Medications, and allergies have been reviewed in [facility], and have been updated if relevant.  Review of Systems  Constitutional: Negative.   HENT: Negative.   Respiratory: Negative.   Cardiovascular: Negative.   Gastrointestinal: Negative.   Endocrine: Negative.   Genitourinary: Negative.   Musculoskeletal: Negative.   Skin: Negative.   Allergic/Immunologic: Negative.   Neurological: Negative.   Hematological: Negative.   Psychiatric/Behavioral: Negative.   All other systems reviewed and are negative.   Wt Readings from Last 3 Encounters:  12/23/15 118 lb 8 oz (53.751 kg)  06/17/15 125 lb (56.7 kg)  12/03/14 126 lb 4 oz (57.267 kg)   Physical exam:  BP 136/72 mmHg  Pulse 99  Temp(Src) 98.6 F (37 C) (Oral)  Wt 118 lb 8 oz (53.751 kg)  SpO2 96%  Physical Exam  Constitutional: She is well-developed, well-nourished, and in no distress. No distress.  HENT:  Head: Normocephalic.  Eyes: Conjunctivae are normal.  Neck: Normal range of motion.  Cardiovascular: Normal rate and regular rhythm.   Pulmonary/Chest: Effort normal and breath sounds normal. No respiratory distress.  Abdominal: Soft.  Musculoskeletal: Normal range of motion.  Neurological: She is alert.  Skin: Skin is warm and dry. She is not diaphoretic.  Psychiatric: Memory, affect and judgment normal.  Nursing note and vitals reviewed.

## 2016-01-15 ENCOUNTER — Other Ambulatory Visit: Payer: Self-pay | Admitting: Family Medicine

## 2016-01-23 ENCOUNTER — Other Ambulatory Visit: Payer: Self-pay | Admitting: Obstetrics and Gynecology

## 2016-02-18 ENCOUNTER — Other Ambulatory Visit: Payer: Self-pay | Admitting: Family Medicine

## 2016-03-24 ENCOUNTER — Other Ambulatory Visit: Payer: Self-pay | Admitting: Family Medicine

## 2016-04-24 ENCOUNTER — Encounter: Payer: Self-pay | Admitting: Family Medicine

## 2016-04-24 ENCOUNTER — Ambulatory Visit (INDEPENDENT_AMBULATORY_CARE_PROVIDER_SITE_OTHER): Payer: Medicare Other | Admitting: Family Medicine

## 2016-04-24 VITALS — BP 146/70 | HR 94 | Temp 98.1°F | Ht 66.0 in | Wt 118.5 lb

## 2016-04-24 DIAGNOSIS — Z23 Encounter for immunization: Secondary | ICD-10-CM

## 2016-04-24 DIAGNOSIS — M7918 Myalgia, other site: Secondary | ICD-10-CM | POA: Insufficient documentation

## 2016-04-24 DIAGNOSIS — M791 Myalgia: Secondary | ICD-10-CM | POA: Diagnosis not present

## 2016-04-24 MED ORDER — CYCLOBENZAPRINE HCL 10 MG PO TABS: 5.0000 mg | ORAL_TABLET | Freq: Three times a day (TID) | ORAL | 0 refills | Status: DC | PRN

## 2016-04-24 NOTE — Assessment & Plan Note (Addendum)
In spasm -suspect from strain last weekend  See AVS for directions Responded to rhomboid stretch in office-taught her how to do this  Heat/ bio freeze prn Flexeril prn with caution of sedation (best at night) No nsaids due to renal insufficiency  Update if not starting to improve in a week or if worsening   May consider films (in light of hx of breast cancer)  And PT if no improvement

## 2016-04-24 NOTE — Progress Notes (Signed)
Subjective:    Patient ID: Bianca [name], female    DOB: Jul 17, 1941, 75 y.o.   MRN: [id number]  HPI   Here for R shoulder symptoms  Constant pain along inside of her shoulder blade  Pain shoots down her arm -not numb but feels funny   Started on Sunday- cannot remember any trauma , but she carried a bag of clothes out to the car  ? If she strained something  Some relief to flex neck -but neck does not hurt  Tried hot water on it in the shower  Also tried bio freeze (that works the best)  Also capsacin   Uses a "mypillow" for support   She took tylenol  Did not try ibuprofen or naproxen    Also wants flu shot -got it today   Hx of L breast cancer   Hx of smoking   BP Readings from Last 3 Encounters:  04/24/16 (!) 146/70  12/23/15 136/72  06/17/15 (!) 146/76   bp is up on first check today  Patient Active Problem List   Diagnosis Date Noted  . Rhomboid muscle pain 04/24/2016  . Weight loss, non-intentional 12/23/2015  . Tobacco abuse 10/26/2013  . Breast cancer of upper-outer quadrant of left female breast ([place]) 03/06/2013  . COPD (chronic obstructive pulmonary disease) ([place]) 06/09/2012  . Adjustment disorder 11/25/2010  . CHRONIC KIDNEY DISEASE STAGE III (MODERATE) 11/13/2009  . Depression 05/04/2007  . ASTHMA 05/03/2007  . HYPERTENSION, BENIGN ESSENTIAL 12/06/2006  . ALLERGIC  RHINITIS 12/06/2006   Past Medical History:  Diagnosis Date  . Anxiety   . Arthritis   . Asthma   . COPD (chronic obstructive pulmonary disease) ([place])   . HOH (hard of hearing)   . Hypertension   . Wears dentures    top and partial bottom  . Wears glasses    Past Surgical History:  Procedure Laterality Date  . APPENDECTOMY    . BREAST LUMPECTOMY WITH NEEDLE LOCALIZATION Right 04/21/2013   Procedure: BREAST LUMPECTOMY WITH NEEDLE LOCALIZATION;  Surgeon: Harl Bowie, MD;  Location: [facility];  Service: General;  Laterality: Right;  . BREAST SURGERY  1985     cyst rt br  . MASTECTOMY W/ SENTINEL NODE BIOPSY Left 04/21/2013   Procedure: MASTECTOMY WITH SENTINEL LYMPH NODE BIOPSY;  Surgeon: Harl Bowie, MD;  Location: [place];  Service: General;  Laterality: Left;  . OTHER SURGICAL HISTORY     cysts removed from foot  . OTHER SURGICAL HISTORY     cysts removed from breast  . TUBAL LIGATION     Social History  Substance Use Topics  . Smoking status: Current Every Day Smoker    Packs/day: 0.50    Types: Cigarettes    Last attempt to quit: 07/20/2004  . Smokeless tobacco: Not on file  . Alcohol use No   No family history on file. No Known Allergies Current Outpatient Prescriptions on File Prior to Visit  Medication Sig Dispense Refill  . ADVAIR DISKUS 100-50 MCG/DOSE AEPB TAKE 1 PUFF TWICE A DAY 60 each 3  . albuterol (PROAIR HFA) 108 (90 BASE) MCG/ACT inhaler Inhale 1-2 puffs into the lungs every 4 (four) hours as needed. 8.5 each 5  . busPIRone (BUSPAR) 15 MG tablet TAKE 1 TABLET (15 MG TOTAL) BY MOUTH 2 (TWO) TIMES DAILY. 60 tablet 3  . CALCIUM PO Take by mouth.    . Cholecalciferol (VITAMIN D-3 PO) Take by mouth.    Marland Kitchen  fexofenadine (ALLEGRA) 180 MG tablet Take 180 mg by mouth as directed.      . Multiple Vitamin (MULTIVITAMIN) tablet Take 1 tablet by mouth daily.      . Omega-3 Fatty Acids (OMEGA 3 PO) Take by mouth.    Marland Kitchen UNABLE TO FIND Rx: L8000- Post Surgical Bras (Quantity: 6)\ Dx: 174.9; Left mastectomy 1 each 0  . valsartan (DIOVAN) 40 MG tablet TAKE 1 TABLET (40 MG TOTAL) BY MOUTH DAILY. 30 tablet 10  . valsartan (DIOVAN) 40 MG tablet Take 1 tablet (40 mg total) by mouth daily. COMPLETE PHYSICAL EXAM REQUIRED FOR ADDITIONAL REFILLS 90 tablet 0  . buPROPion (WELLBUTRIN SR) 150 MG 12 hr tablet TAKE 1 TABLET (150 MG TOTAL) BY MOUTH 2 (TWO) TIMES DAILY. (Patient not taking: Reported on 04/24/2016) 60 tablet 1   No current facility-administered medications on file prior to visit.     Review of Systems     Review of Systems  Constitutional: Negative for fever, appetite change, fatigue and unexpected weight change.  Eyes: Negative for pain and visual disturbance.  Respiratory: Negative for cough and shortness of breath.   Cardiovascular: Negative for cp or palpitations    Gastrointestinal: Negative for nausea, diarrhea and constipation.  Genitourinary: Negative for urgency and frequency.  Skin: Negative for pallor or rash   MSK pos for R back pain in area of scapula  Neurological: Negative for weakness, light-headedness, numbness and headaches.  Hematological: Negative for adenopathy. Does not bruise/bleed easily.  Psychiatric/Behavioral: Negative for dysphoric mood. The patient is not nervous/anxious.      Objective:   Physical Exam  Constitutional: She appears well-developed and well-nourished. No distress.  Well appearing elderly female   Eyes: Conjunctivae and EOM are normal. Pupils are equal, round, and reactive to light.  Neck: Normal range of motion. Neck supple. No thyromegaly present.  Cardiovascular: Normal rate and regular rhythm.   Pulmonary/Chest: Effort normal and breath sounds normal. No respiratory distress. She has no wheezes.  Musculoskeletal: She exhibits tenderness. She exhibits no edema.  Tender over R back- medial to scapula in rhomboid area (with spasm) Also tender over R trapezius  No bony tenderness Nl CS/ TS exams  Nl rom of neck and shoulder without pain  No neuro def   Lymphadenopathy:    She has no cervical adenopathy.  Neurological: She is alert. She has normal reflexes. She displays no atrophy. No cranial nerve deficit or sensory deficit. She exhibits normal muscle tone. Coordination normal.  Skin: Skin is warm and dry. No rash noted. No erythema. No pallor.  Psychiatric: She has a normal mood and affect.          Assessment & Plan:   Problem List Items Addressed This Visit      Other   Rhomboid muscle pain    In spasm -suspect from strain  last weekend  See AVS for directions Responded to rhomboid stretch in office-taught her how to do this  Heat/ bio freeze prn Flexeril prn with caution of sedation (best at night) No nsaids due to renal insufficiency  Update if not starting to improve in a week or if worsening   May consider films or PT if no improvement        Other Visit Diagnoses   None.

## 2016-04-24 NOTE — Patient Instructions (Signed)
I think you have a muscle spasm in your back (the Rhomboid muscle)  Try the stretch I taught you- whenever you can  Sleep with a supportive pillow Bio freeze is ok and heat is ok -but not at the same time Try the flexeril as needed (muscle relaxer)- with caution of sedation   Update if not starting to improve in a week or if worsening

## 2016-04-24 NOTE — Addendum Note (Signed)
Addended by: Magdalen Spatz C on: 04/24/2016 06:07 PM   Modules accepted: Orders

## 2016-06-01 ENCOUNTER — Encounter: Payer: Self-pay | Admitting: Family Medicine

## 2016-06-22 ENCOUNTER — Other Ambulatory Visit: Payer: Self-pay | Admitting: Family Medicine

## 2016-07-02 ENCOUNTER — Ambulatory Visit (INDEPENDENT_AMBULATORY_CARE_PROVIDER_SITE_OTHER): Payer: Medicare Other | Admitting: Family Medicine

## 2016-07-02 ENCOUNTER — Encounter: Payer: Self-pay | Admitting: Family Medicine

## 2016-07-02 VITALS — BP 128/78 | HR 91 | Temp 97.6°F | Wt 119.2 lb

## 2016-07-02 DIAGNOSIS — N183 Chronic kidney disease, stage 3 unspecified: Secondary | ICD-10-CM

## 2016-07-02 DIAGNOSIS — M810 Age-related osteoporosis without current pathological fracture: Secondary | ICD-10-CM | POA: Insufficient documentation

## 2016-07-02 DIAGNOSIS — F3289 Other specified depressive episodes: Secondary | ICD-10-CM | POA: Diagnosis not present

## 2016-07-02 DIAGNOSIS — Z720 Tobacco use: Secondary | ICD-10-CM

## 2016-07-02 DIAGNOSIS — J438 Other emphysema: Secondary | ICD-10-CM

## 2016-07-02 DIAGNOSIS — I10 Essential (primary) hypertension: Secondary | ICD-10-CM

## 2016-07-02 LAB — COMPREHENSIVE METABOLIC PANEL
ALT: 13 U/L (ref 0–35)
AST: 21 U/L (ref 0–37)
Albumin: 4.2 g/dL (ref 3.5–5.2)
Alkaline Phosphatase: 70 U/L (ref 39–117)
BUN: 26 mg/dL — ABNORMAL HIGH (ref 6–23)
CALCIUM: 9.4 mg/dL (ref 8.4–10.5)
CHLORIDE: 106 meq/L (ref 96–112)
CO2: 29 meq/L (ref 19–32)
Creatinine, Ser: 1.26 mg/dL — ABNORMAL HIGH (ref 0.40–1.20)
GFR: 43.73 mL/min — AB (ref >60.00)
Glucose, Bld: 96 mg/dL (ref 70–99)
Potassium: 4.3 mEq/L (ref 3.5–5.1)
Sodium: 141 mEq/L (ref 135–145)
Total Bilirubin: 0.4 mg/dL (ref 0.2–1.2)
Total Protein: 6.8 g/dL (ref 6.0–8.3)

## 2016-07-02 LAB — LIPID PANEL
CHOLESTEROL: 146 mg/dL (ref 0–200)
HDL: 59.3 mg/dL (ref >39.00)
LDL CALC: 72 mg/dL (ref 0–99)
NonHDL: 86.31
TRIGLYCERIDES: 70 mg/dL (ref 0.0–149.0)
Total CHOL/HDL Ratio: 2
VLDL: 14 mg/dL (ref 0.0–40.0)

## 2016-07-02 LAB — TSH: TSH: 2.01 u[IU]/mL (ref 0.35–4.50)

## 2016-07-02 MED ORDER — ALENDRONATE SODIUM 70 MG PO TABS: 70.0000 mg | ORAL_TABLET | ORAL | 11 refills | Status: DC

## 2016-07-02 NOTE — Assessment & Plan Note (Signed)
Well controlled. No changes made to rxs. 

## 2016-07-02 NOTE — Addendum Note (Signed)
Addended by: Lucille Passy on: 07/02/2016 12:11 PM   Modules accepted: Orders

## 2016-07-02 NOTE — Patient Instructions (Addendum)
Great to see you. Happy Holidays.  Give my love to your husband.  We are starting Fosamax.

## 2016-07-02 NOTE — Progress Notes (Addendum)
75 yo very pleasant female here for follow up.   Wt Readings from Last 3 Encounters:  07/02/16 119 lb 4 oz (54.1 kg)  04/24/16 118 lb 8 oz (53.8 kg)  12/23/15 118 lb 8 oz (53.8 kg)   Weight is now stable but she is still under a lot of stress with her husband and daughter's health.  Osteoporosis- recent DEXA ordered by OBGYN consistent with osteoporosis.  Per pt, OBGYN asked that I start rx.  Breast CA- s/p left mastectomy in 04/2013 Ninfa Linden).  Doing well.  Cancer was localized  Did not need any chemotherapy, radiation or tamoxifen.  Sees Dr. Jana Hakim yearly.  HTN- On Diovan 80-12.5 mg daily.  Denies any HA, blurred vision, SOB or LE edema.  Due for labs.   Lab Results  Component Value Date   CREATININE 1.43 (H) 06/17/2015   Anxiety/depression- feels Buspar is working despite recent stressors.  COPD- Using Advair daily and proventil prn.  No recent exacerbations.  Still smoking.   Influenza and  Pneumooccal vaccines UTD.  Tobacco abuse- smokes 4-5 cigs/day. She is still not quite ready to quit.  CKD- stage III. Sees Dr. Florene Glen, nephrology yearly.  Lab Results  Component Value Date   CREATININE 1.43 (H) 06/17/2015   Lab Results  Component Value Date   CHOL 155 06/17/2015   HDL 51.30 06/17/2015   LDLCALC 88 06/17/2015   LDLDIRECT 94.8 04/24/2010   TRIG 80.0 06/17/2015   CHOLHDL 3 06/17/2015     Patient Active Problem List  Diagnosis  . Depression  . HYPERTENSION, BENIGN ESSENTIAL  . ALLERGIC  RHINITIS  . ASTHMA  . CHRONIC KIDNEY DISEASE STAGE III (MODERATE)  . Adjustment disorder  . COPD (chronic obstructive pulmonary disease) ([place])  . Breast cancer of upper-outer quadrant of left female breast ([place])  . Tobacco abuse   Past Medical History:  Diagnosis Date  . Anxiety   . Arthritis   . Asthma   . COPD (chronic obstructive pulmonary disease) ([place])   . HOH (hard of hearing)   . Hypertension   . Wears dentures    top and partial bottom  . Wears glasses     Past Surgical History:  Procedure Laterality Date  . APPENDECTOMY    . BREAST LUMPECTOMY WITH NEEDLE LOCALIZATION Right 04/21/2013   Procedure: BREAST LUMPECTOMY WITH NEEDLE LOCALIZATION;  Surgeon: Harl Bowie, MD;  Location: [facility];  Service: General;  Laterality: Right;  . BREAST SURGERY  1985   cyst rt br  . MASTECTOMY W/ SENTINEL NODE BIOPSY Left 04/21/2013   Procedure: MASTECTOMY WITH SENTINEL LYMPH NODE BIOPSY;  Surgeon: Harl Bowie, MD;  Location: [facility];  Service: General;  Laterality: Left;  . OTHER SURGICAL HISTORY     cysts removed from foot  . OTHER SURGICAL HISTORY     cysts removed from breast  . TUBAL LIGATION     Social History  Substance Use Topics  . Smoking status: Current Every Day Smoker    Packs/day: 0.50    Types: Cigarettes    Last attempt to quit: 07/20/2004  . Smokeless tobacco: Not on file  . Alcohol use No   No family history on file. No Known Allergies Current Outpatient Prescriptions on File Prior to Visit  Medication Sig Dispense Refill  . ADVAIR DISKUS 100-50 MCG/DOSE AEPB TAKE 1 PUFF TWICE A DAY 60 each 3  . albuterol (PROAIR HFA) 108 (90 BASE) MCG/ACT inhaler Inhale 1-2 puffs into the  lungs every 4 (four) hours as needed. 8.5 each 5  . busPIRone (BUSPAR) 15 MG tablet TAKE 1 TABLET (15 MG TOTAL) BY MOUTH 2 (TWO) TIMES DAILY. 60 tablet 3  . CALCIUM PO Take by mouth.    . Cholecalciferol (VITAMIN D-3 PO) Take by mouth.    . fexofenadine (ALLEGRA) 180 MG tablet Take 180 mg by mouth as directed.      . Multiple Vitamin (MULTIVITAMIN) tablet Take 1 tablet by mouth daily.      . Omega-3 Fatty Acids (OMEGA 3 PO) Take by mouth.    Marland Kitchen UNABLE TO FIND Rx: L8000- Post Surgical Bras (Quantity: 6)\ Dx: 174.9; Left mastectomy 1 each 0  . valsartan (DIOVAN) 40 MG tablet TAKE ONE TABLET BY MOUTH EVERY DAY. COMPLETED PHYSICAL EXAM REQUIRED FOR MORE REFILLS. 90 tablet 2   No current facility-administered  medications on file prior to visit.    The PMH, PSH, Social History, Family History, Medications, and allergies have been reviewed in [facility], and have been updated if relevant.  Review of Systems  Constitutional: Negative.   HENT: Negative.   Respiratory: Negative.   Cardiovascular: Negative.   Gastrointestinal: Negative.   Endocrine: Negative.   Genitourinary: Negative.   Musculoskeletal: Negative.   Skin: Negative.   Allergic/Immunologic: Negative.   Neurological: Negative.   Hematological: Negative.   Psychiatric/Behavioral: Negative.   All other systems reviewed and are negative.   Wt Readings from Last 3 Encounters:  07/02/16 119 lb 4 oz (54.1 kg)  04/24/16 118 lb 8 oz (53.8 kg)  12/23/15 118 lb 8 oz (53.8 kg)   Physical exam:  BP 128/78   Pulse 91   Temp 97.6 F (36.4 C) (Oral)   Wt 119 lb 4 oz (54.1 kg)   SpO2 97%   BMI 19.25 kg/m   Physical Exam  Constitutional: She is well-developed, well-nourished, and in no distress. No distress.  HENT:  Head: Normocephalic.  Eyes: Conjunctivae are normal.  Neck: Normal range of motion.  Cardiovascular: Normal rate and regular rhythm.   Pulmonary/Chest: Effort normal and breath sounds normal. No respiratory distress.  Abdominal: Soft.  Musculoskeletal: Normal range of motion.  Neurological: She is alert.  Skin: Skin is warm and dry. She is not diaphoretic.  Psychiatric: Memory, affect and judgment normal.  Nursing note and vitals reviewed.

## 2016-07-02 NOTE — Assessment & Plan Note (Signed)
Not ready to quit.  

## 2016-07-02 NOTE — Assessment & Plan Note (Signed)
Deteriorated. eRx sent for fosamax. Discussed how to take it and possible side effects.

## 2016-07-02 NOTE — Assessment & Plan Note (Signed)
No recent exacerbations. No changes made to rxs.

## 2016-07-03 LAB — CBC WITH DIFFERENTIAL/PLATELET
BASOS PCT: 0.3 % (ref 0.0–3.0)
Basophils Absolute: 0 10*3/uL (ref 0.0–0.1)
EOS ABS: 0.1 10*3/uL (ref 0.0–0.7)
Eosinophils Relative: 1.3 % (ref 0.0–5.0)
HEMATOCRIT: 36.1 % (ref 36.0–46.0)
Hemoglobin: 12.3 g/dL (ref 12.0–15.0)
Lymphocytes Relative: 25.9 % (ref 12.0–46.0)
Lymphs Abs: 2 10*3/uL (ref 0.7–4.0)
MCHC: 34 g/dL (ref 30.0–36.0)
MCV: 88.3 fl (ref 78.0–100.0)
Monocytes Absolute: 0.1 10*3/uL (ref 0.1–1.0)
Monocytes Relative: 1.4 % — ABNORMAL LOW (ref 3.0–12.0)
NEUTROS ABS: 5.4 10*3/uL (ref 1.4–7.7)
Neutrophils Relative %: 71.1 % (ref 43.0–77.0)
PLATELETS: 291 10*3/uL (ref 150.0–400.0)
RBC: 4.09 Mil/uL (ref 3.87–5.11)
RDW: 14.1 % (ref 11.5–15.5)
WBC: 7.6 10*3/uL (ref 4.0–10.5)

## 2016-07-07 ENCOUNTER — Encounter: Payer: Self-pay | Admitting: *Deleted

## 2016-08-24 ENCOUNTER — Other Ambulatory Visit: Payer: Self-pay | Admitting: Family Medicine

## 2017-01-05 ENCOUNTER — Ambulatory Visit (INDEPENDENT_AMBULATORY_CARE_PROVIDER_SITE_OTHER): Payer: PPO | Admitting: Family Medicine

## 2017-01-05 VITALS — BP 120/60 | HR 105 | Ht 66.0 in | Wt 115.0 lb

## 2017-01-05 DIAGNOSIS — Z720 Tobacco use: Secondary | ICD-10-CM

## 2017-01-05 DIAGNOSIS — F3289 Other specified depressive episodes: Secondary | ICD-10-CM

## 2017-01-05 DIAGNOSIS — N183 Chronic kidney disease, stage 3 unspecified: Secondary | ICD-10-CM

## 2017-01-05 DIAGNOSIS — I10 Essential (primary) hypertension: Secondary | ICD-10-CM

## 2017-01-05 DIAGNOSIS — C50412 Malignant neoplasm of upper-outer quadrant of left female breast: Secondary | ICD-10-CM

## 2017-01-05 DIAGNOSIS — J438 Other emphysema: Secondary | ICD-10-CM

## 2017-01-05 DIAGNOSIS — M810 Age-related osteoporosis without current pathological fracture: Secondary | ICD-10-CM | POA: Diagnosis not present

## 2017-01-05 LAB — CBC WITH DIFFERENTIAL/PLATELET
BASOS ABS: 0 10*3/uL (ref 0.0–0.1)
Basophils Relative: 0.4 % (ref 0.0–3.0)
EOS ABS: 0.1 10*3/uL (ref 0.0–0.7)
Eosinophils Relative: 0.6 % (ref 0.0–5.0)
HEMATOCRIT: 38.1 % (ref 36.0–46.0)
HEMOGLOBIN: 12.6 g/dL (ref 12.0–15.0)
Lymphocytes Relative: 25.6 % (ref 12.0–46.0)
Lymphs Abs: 2.2 10*3/uL (ref 0.7–4.0)
MCHC: 33 g/dL (ref 30.0–36.0)
MCV: 89.7 fl (ref 78.0–100.0)
Monocytes Absolute: 0.6 10*3/uL (ref 0.1–1.0)
Monocytes Relative: 6.7 % (ref 3.0–12.0)
Neutro Abs: 5.7 10*3/uL (ref 1.4–7.7)
Neutrophils Relative %: 66.7 % (ref 43.0–77.0)
Platelets: 362 10*3/uL (ref 150.0–400.0)
RBC: 4.25 Mil/uL (ref 3.87–5.11)
RDW: 14.1 % (ref 11.5–15.5)
WBC: 8.6 10*3/uL (ref 4.0–10.5)

## 2017-01-05 LAB — LIPID PANEL
CHOL/HDL RATIO: 3
Cholesterol: 143 mg/dL (ref 0–200)
HDL: 49.3 mg/dL (ref >39.00)
LDL CALC: 72 mg/dL (ref 0–99)
NonHDL: 93.52
TRIGLYCERIDES: 108 mg/dL (ref 0.0–149.0)
VLDL: 21.6 mg/dL (ref 0.0–40.0)

## 2017-01-05 LAB — COMPREHENSIVE METABOLIC PANEL
ALBUMIN: 4.1 g/dL (ref 3.5–5.2)
ALT: 13 U/L (ref 0–35)
AST: 17 U/L (ref 0–37)
Alkaline Phosphatase: 59 U/L (ref 39–117)
BUN: 28 mg/dL — AB (ref 6–23)
CALCIUM: 9.5 mg/dL (ref 8.4–10.5)
CHLORIDE: 105 meq/L (ref 96–112)
CO2: 28 meq/L (ref 19–32)
CREATININE: 1.52 mg/dL — AB (ref 0.40–1.20)
GFR: 35.17 mL/min — ABNORMAL LOW (ref >60.00)
Glucose, Bld: 98 mg/dL (ref 70–99)
Potassium: 4.4 mEq/L (ref 3.5–5.1)
SODIUM: 141 meq/L (ref 135–145)
Total Bilirubin: 0.3 mg/dL (ref 0.2–1.2)
Total Protein: 6.6 g/dL (ref 6.0–8.3)

## 2017-01-05 LAB — TSH: TSH: 2.75 u[IU]/mL (ref 0.35–4.50)

## 2017-01-05 MED ORDER — BUSPIRONE HCL 15 MG PO TABS: ORAL_TABLET | ORAL | 3 refills | Status: DC

## 2017-01-05 NOTE — Assessment & Plan Note (Signed)
Cr stable. Labs today.

## 2017-01-05 NOTE — Assessment & Plan Note (Signed)
Well controlled. No changes made today. 

## 2017-01-05 NOTE — Progress Notes (Signed)
Pre visit review using our clinic review tool, if applicable. No additional management support is needed unless otherwise documented below in the visit note. 

## 2017-01-05 NOTE — Assessment & Plan Note (Signed)
Reasonable control with current rx despite recent stressors. No changes made to rxs.

## 2017-01-05 NOTE — Progress Notes (Addendum)
76 yo very pleasant female here for follow up.   Wt Readings from Last 3 Encounters:  01/05/17 115 lb (52.2 kg)  07/02/16 119 lb 4 oz (54.1 kg)  04/24/16 118 lb 8 oz (53.8 kg)   Weight is now stable but she is still under a lot of stress with her husband and daughter's health. Husband now has metastatic prostate CA.  She is very worried about him but he seems to be in good spirits.  Osteoporosis- compliant with fosamax.  Denies side effects.  Breast CA- s/p left mastectomy in 04/2013 N[facility]).  Doing well.  Cancer was localized  Did not need any chemotherapy, radiation or tamoxifen.  Sees Dr. Jana Hakim yearly.  HTN- On Diovan 80-12.5 mg daily.  Denies any HA, blurred vision, SOB or LE edema.  Due for labs.   Lab Results  Component Value Date   CREATININE 1.26 (H) 07/02/2016   Anxiety/depression- feels Buspar is working despite recent stressors.  COPD- Using Advair daily and proventil prn.  No recent exacerbations.  Still smoking.  Vaccines UTD.  Tobacco abuse- smokes 4-5 cigs/day. She is still not quite ready to quit.  CKD- stage III. Sees Dr. Florene Glen, nephrology yearly.  Lab Results  Component Value Date   CREATININE 1.26 (H) 07/02/2016   Lab Results  Component Value Date   CHOL 146 07/02/2016   HDL 59.30 07/02/2016   LDLCALC 72 07/02/2016   LDLDIRECT 94.8 04/24/2010   TRIG 70.0 07/02/2016   CHOLHDL 2 07/02/2016     Patient Active Problem List   Diagnosis Date Noted  . Osteoporosis 07/02/2016  . Tobacco abuse 10/26/2013  . Breast cancer of upper-outer quadrant of left female breast ([place]) 03/06/2013  . COPD (chronic obstructive pulmonary disease) ([place]) 06/09/2012  . Adjustment disorder 11/25/2010  . CHRONIC KIDNEY DISEASE STAGE III (MODERATE) 11/13/2009  . Depression 05/04/2007  . ASTHMA 05/03/2007  . HYPERTENSION, BENIGN ESSENTIAL 12/06/2006  . ALLERGIC  RHINITIS 12/06/2006   Past Medical History:  Diagnosis Date  . Anxiety   . Arthritis   . Asthma   .  COPD (chronic obstructive pulmonary disease) ([place])   . HOH (hard of hearing)   . Hypertension   . Wears dentures    top and partial bottom  . Wears glasses    Past Surgical History:  Procedure Laterality Date  . APPENDECTOMY    . BREAST LUMPECTOMY WITH NEEDLE LOCALIZATION Right 04/21/2013   Procedure: BREAST LUMPECTOMY WITH NEEDLE LOCALIZATION;  Surgeon: Harl Bowie, MD;  Location: [facility];  Service: General;  Laterality: Right;  . BREAST SURGERY  1985   cyst rt br  . MASTECTOMY W/ SENTINEL NODE BIOPSY Left 04/21/2013   Procedure: MASTECTOMY WITH SENTINEL LYMPH NODE BIOPSY;  Surgeon: Harl Bowie, MD;  Location: [facility];  Service: General;  Laterality: Left;  . OTHER SURGICAL HISTORY     cysts removed from foot  . OTHER SURGICAL HISTORY     cysts removed from breast  . TUBAL LIGATION     Social History  Substance Use Topics  . Smoking status: Current Every Day Smoker    Packs/day: 0.50    Types: Cigarettes    Last attempt to quit: 07/20/2004  . Smokeless tobacco: Not on file  . Alcohol use No   No family history on file. No Known Allergies Current Outpatient Prescriptions on File Prior to Visit  Medication Sig Dispense Refill  . ADVAIR DISKUS 100-50 MCG/DOSE AEPB INHALE 1 PUFF TWICE  A DAY AS DIRECTED **RINSE MOUTH AFTER USE** 60 each 5  . albuterol (PROAIR HFA) 108 (90 BASE) MCG/ACT inhaler Inhale 1-2 puffs into the lungs every 4 (four) hours as needed. 8.5 each 5  . alendronate (FOSAMAX) 70 MG tablet Take 1 tablet (70 mg total) by mouth every 7 (seven) days. Take with a full glass of water on an empty stomach. 4 tablet 11  . busPIRone (BUSPAR) 15 MG tablet TAKE 1 TABLET (15 MG TOTAL) BY MOUTH 2 (TWO) TIMES DAILY. 60 tablet 3  . CALCIUM PO Take by mouth.    . Cholecalciferol (VITAMIN D-3 PO) Take by mouth.    . fexofenadine (ALLEGRA) 180 MG tablet Take 180 mg by mouth as directed.      . Multiple Vitamin (MULTIVITAMIN) tablet Take  1 tablet by mouth daily.      . Omega-3 Fatty Acids (OMEGA 3 PO) Take by mouth.    Marland Kitchen UNABLE TO FIND Rx: L8000- Post Surgical Bras (Quantity: 6)\ Dx: 174.9; Left mastectomy 1 each 0  . valsartan (DIOVAN) 40 MG tablet TAKE ONE TABLET BY MOUTH EVERY DAY. COMPLETED PHYSICAL EXAM REQUIRED FOR MORE REFILLS. 90 tablet 2   No current facility-administered medications on file prior to visit.    The PMH, PSH, Social History, Family History, Medications, and allergies have been reviewed in [facility], and have been updated if relevant.  Review of Systems  Constitutional: Negative.   HENT: Negative.   Respiratory: Negative.   Cardiovascular: Negative.   Gastrointestinal: Negative.   Endocrine: Negative.   Genitourinary: Negative.   Musculoskeletal: Negative.   Skin: Negative.   Allergic/Immunologic: Negative.   Neurological: Negative.   Hematological: Negative.   Psychiatric/Behavioral: Positive for dysphoric mood and sleep disturbance. Negative for hallucinations, self-injury and suicidal ideas. The patient is nervous/anxious. The patient is not hyperactive.   All other systems reviewed and are negative.   Wt Readings from Last 3 Encounters:  01/05/17 115 lb (52.2 kg)  07/02/16 119 lb 4 oz (54.1 kg)  04/24/16 118 lb 8 oz (53.8 kg)   BP 120/60   Pulse (!) 105   Ht 5\' 6"  (1.676 m)   Wt 115 lb (52.2 kg)   SpO2 99%   BMI 18.56 kg/m   Physical Exam  Constitutional: She is well-developed, well-nourished, and in no distress. No distress.  HENT:  Head: Normocephalic.  Eyes: Conjunctivae are normal.  Neck: Normal range of motion.  Cardiovascular: Normal rate and regular rhythm.   Pulmonary/Chest: Effort normal and breath sounds normal. No respiratory distress.  Abdominal: Soft.  Musculoskeletal: Normal range of motion. She exhibits no edema.  Neurological: She is alert.  Skin: Skin is warm and dry. She is not diaphoretic.  Psychiatric: Memory, affect and judgment normal.  Nursing note and  vitals reviewed.

## 2017-01-05 NOTE — Assessment & Plan Note (Signed)
No recent exacerbations, continue current rxs.

## 2017-01-05 NOTE — Assessment & Plan Note (Signed)
Doing well. On obs only now. Mammogram UTD.

## 2017-01-05 NOTE — Assessment & Plan Note (Signed)
Continue fosamax. DEXA in 2019.

## 2017-01-05 NOTE — Addendum Note (Signed)
Addended by: Lucille Passy on: 01/05/2017 12:54 PM   Modules accepted: Orders

## 2017-01-05 NOTE — Patient Instructions (Signed)
Great to see you. Please say hello to Bianca [name] for me.  We will call you with your results from today and you can view them online.

## 2017-01-11 DIAGNOSIS — L821 Other seborrheic keratosis: Secondary | ICD-10-CM | POA: Diagnosis not present

## 2017-01-11 DIAGNOSIS — D225 Melanocytic nevi of trunk: Secondary | ICD-10-CM | POA: Diagnosis not present

## 2017-01-11 DIAGNOSIS — Z85828 Personal history of other malignant neoplasm of skin: Secondary | ICD-10-CM | POA: Diagnosis not present

## 2017-01-11 DIAGNOSIS — D692 Other nonthrombocytopenic purpura: Secondary | ICD-10-CM | POA: Diagnosis not present

## 2017-02-17 ENCOUNTER — Telehealth: Payer: Self-pay | Admitting: Family Medicine

## 2017-02-17 NOTE — Telephone Encounter (Signed)
Left pt message asking to call Ebony Hail back directly at [phone] to schedule AWV + labs with Katha Cabal and CPE with PCP.  *NOTE* Last AWV 06/17/15

## 2017-03-15 DIAGNOSIS — Z853 Personal history of malignant neoplasm of breast: Secondary | ICD-10-CM | POA: Diagnosis not present

## 2017-03-15 DIAGNOSIS — Z1231 Encounter for screening mammogram for malignant neoplasm of breast: Secondary | ICD-10-CM | POA: Diagnosis not present

## 2017-03-19 DIAGNOSIS — N6489 Other specified disorders of breast: Secondary | ICD-10-CM | POA: Diagnosis not present

## 2017-03-19 DIAGNOSIS — N6341 Unspecified lump in right breast, subareolar: Secondary | ICD-10-CM | POA: Diagnosis not present

## 2017-03-24 ENCOUNTER — Other Ambulatory Visit: Payer: Self-pay | Admitting: Family Medicine

## 2017-03-25 NOTE — Telephone Encounter (Signed)
Scheduled 04/19/17

## 2017-03-29 ENCOUNTER — Ambulatory Visit (INDEPENDENT_AMBULATORY_CARE_PROVIDER_SITE_OTHER): Payer: PPO | Admitting: Family Medicine

## 2017-03-29 ENCOUNTER — Encounter: Payer: Self-pay | Admitting: Family Medicine

## 2017-03-29 VITALS — BP 126/62 | HR 92 | Temp 98.5°F | Ht 66.0 in | Wt 112.2 lb

## 2017-03-29 DIAGNOSIS — J209 Acute bronchitis, unspecified: Secondary | ICD-10-CM

## 2017-03-29 DIAGNOSIS — J440 Chronic obstructive pulmonary disease with acute lower respiratory infection: Secondary | ICD-10-CM

## 2017-03-29 MED ORDER — DOXYCYCLINE HYCLATE 100 MG PO TABS: 100.0000 mg | ORAL_TABLET | Freq: Two times a day (BID) | ORAL | 0 refills | Status: DC

## 2017-03-29 MED ORDER — GUAIFENESIN-CODEINE 100-10 MG/5ML PO SYRP: 5.0000 mL | ORAL_SOLUTION | Freq: Four times a day (QID) | ORAL | 0 refills | Status: DC | PRN

## 2017-03-29 MED ORDER — BENZONATATE 200 MG PO CAPS: 200.0000 mg | ORAL_CAPSULE | Freq: Three times a day (TID) | ORAL | 1 refills | Status: DC | PRN

## 2017-03-29 MED ORDER — ALBUTEROL SULFATE HFA 108 (90 BASE) MCG/ACT IN AERS: 1.0000 | INHALATION_SPRAY | RESPIRATORY_TRACT | 1 refills | Status: DC | PRN

## 2017-03-29 NOTE — Progress Notes (Signed)
Subjective:    Patient ID: Bianca [name], female    DOB: [birth year], 76 y.o.   MRN: [id number]  HPI 76 yo pt of Dr Deborra Medina here with uri symptoms   She smokes and has hx of copd  Not over 6 cig per day/ sometimes none  Uses advair bid   Has not needed albuterol in quite a while    Symptoms started fri  Cough  Runny nose  All clear mucous  No fever some headache across forehead  Ears feel full but do not hurt  Throat is fine   Coughs all night - can't sleep  No otc medicines  Only a little wheezing if any   02 sat 97%   Patient Active Problem List   Diagnosis Date Noted  . Acute bronchitis with COPD ([place]) 03/29/2017  . Osteoporosis 07/02/2016  . Tobacco abuse 10/26/2013  . Breast cancer of upper-outer quadrant of left female breast ([place]) 03/06/2013  . COPD (chronic obstructive pulmonary disease) ([place]) 06/09/2012  . Adjustment disorder 11/25/2010  . CHRONIC KIDNEY DISEASE STAGE III (MODERATE) 11/13/2009  . Depression 05/04/2007  . ASTHMA 05/03/2007  . HYPERTENSION, BENIGN ESSENTIAL 12/06/2006  . ALLERGIC  RHINITIS 12/06/2006   Past Medical History:  Diagnosis Date  . Anxiety   . Arthritis   . Asthma   . COPD (chronic obstructive pulmonary disease) ([place])   . HOH (hard of hearing)   . Hypertension   . Wears dentures    top and partial bottom  . Wears glasses    Past Surgical History:  Procedure Laterality Date  . APPENDECTOMY    . BREAST LUMPECTOMY WITH NEEDLE LOCALIZATION Right 04/21/2013   Procedure: BREAST LUMPECTOMY WITH NEEDLE LOCALIZATION;  Surgeon: Harl Bowie, MD;  Location: [place];  Service: General;  Laterality: Right;  . BREAST SURGERY  1985   cyst rt br  . MASTECTOMY W/ SENTINEL NODE BIOPSY Left 04/21/2013   Procedure: MASTECTOMY WITH SENTINEL LYMPH NODE BIOPSY;  Surgeon: Harl Bowie, MD;  Location: [place];  Service: General;  Laterality: Left;  . OTHER SURGICAL HISTORY     cysts removed from  foot  . OTHER SURGICAL HISTORY     cysts removed from breast  . TUBAL LIGATION     Social History  Substance Use Topics  . Smoking status: Current Some Day Smoker    Packs/day: 0.50    Types: Cigarettes  . Smokeless tobacco: Never Used  . Alcohol use No   No family history on file. No Known Allergies Current Outpatient Prescriptions on File Prior to Visit  Medication Sig Dispense Refill  . ADVAIR DISKUS 100-50 MCG/DOSE AEPB INHALE 1 PUFF TWICE A DAY AS DIRECTED **RINSE MOUTH AFTER USE** 60 each 5  . alendronate (FOSAMAX) 70 MG tablet Take 1 tablet (70 mg total) by mouth every 7 (seven) days. Take with a full glass of water on an empty stomach. 4 tablet 11  . busPIRone (BUSPAR) 15 MG tablet TAKE 1 TABLET (15 MG TOTAL) BY MOUTH 2 (TWO) TIMES DAILY. 60 tablet 3  . CALCIUM PO Take by mouth.    . Cholecalciferol (VITAMIN D-3 PO) Take by mouth.    . fexofenadine (ALLEGRA) 180 MG tablet Take 180 mg by mouth as directed.      . Multiple Vitamin (MULTIVITAMIN) tablet Take 1 tablet by mouth daily.      . Omega-3 Fatty Acids (OMEGA 3 PO) Take by mouth.    Marland Kitchen UNABLE  TO FIND Rx: L8000- Post Surgical Bras (Quantity: 6)\ Dx: 174.9; Left mastectomy 1 each 0  . valsartan (DIOVAN) 40 MG tablet TAKE ONE TABLET BY MOUTH EVERY DAY. COMPLETED PHYSICAL EXAM REQUIRED FOR MORE REFILLS. 90 tablet 2   No current facility-administered medications on file prior to visit.     Review of Systems  Constitutional: Positive for appetite change and fatigue. Negative for fever.  HENT: Positive for congestion, postnasal drip, rhinorrhea, sinus pressure, sneezing and sore throat. Negative for ear pain.   Eyes: Negative for pain and discharge.  Respiratory: Positive for cough and chest tightness. Negative for shortness of breath, wheezing and stridor.   Cardiovascular: Negative for chest pain.  Gastrointestinal: Negative for diarrhea, nausea and vomiting.  Genitourinary: Negative for frequency, hematuria and urgency.    Musculoskeletal: Negative for arthralgias and myalgias.  Skin: Negative for rash.  Neurological: Positive for headaches. Negative for dizziness, weakness and light-headedness.  Psychiatric/Behavioral: Negative for confusion and dysphoric mood.       Objective:   Physical Exam  Constitutional: She appears well-developed and well-nourished. No distress.  Well appearing with productive cough   HENT:  Head: Normocephalic and atraumatic.  Right Ear: External ear normal.  Left Ear: External ear normal.  Mouth/Throat: Oropharynx is clear and moist.  Nares are injected and congested  No sinus tenderness Clear rhinorrhea and post nasal drip   Eyes: Pupils are equal, round, and reactive to light. Conjunctivae and EOM are normal. Right eye exhibits no discharge. Left eye exhibits no discharge.  Neck: Normal range of motion. Neck supple.  Cardiovascular: Normal rate and normal heart sounds.   Pulmonary/Chest: Effort normal. No respiratory distress. She has wheezes. She has no rales. She exhibits no tenderness.  Diffusely distant bs Junky cough  Wheeze only on forced exp  No sob  No rales Few scattered rhonchi   Lymphadenopathy:    She has no cervical adenopathy.  Neurological: She is alert.  Skin: Skin is warm and dry. No rash noted.  Psychiatric: She has a normal mood and affect.          Assessment & Plan:   Problem List Items Addressed This Visit      Respiratory   Acute bronchitis with COPD ([place])    S/p uri in pt with copd  Reassuring exam Cover with doxycycline  Refilled albuterol for prn use - does not feel the need for prednisone right now  Fluids/rest Tessalon for cough  guif-codeine cough med for bedtime with caution of sedation   Update if not starting to improve in a week or if worsening        Relevant Medications   benzonatate (TESSALON) 200 MG capsule   guaiFENesin-codeine (ROBITUSSIN AC) 100-10 MG/5ML syrup   albuterol (PROAIR HFA) 108 (90 Base)  MCG/ACT inhaler

## 2017-03-29 NOTE — Patient Instructions (Addendum)
I think you have a cold with bronchitis Try not to smoke Use inhaler as needed Try tessalon for cough three times daily  At bedtime you can also try the cough medicine with codiene (caution of sedation)  Drink lots of fluids and rest  Take the doxycycline (antibiotic) as directed   Update if not starting to improve in a week or if worsening

## 2017-03-29 NOTE — Assessment & Plan Note (Signed)
S/p uri in pt with copd  Reassuring exam Cover with doxycycline  Refilled albuterol for prn use - does not feel the need for prednisone right now  Fluids/rest Tessalon for cough  guif-codeine cough med for bedtime with caution of sedation   Update if not starting to improve in a week or if worsening

## 2017-04-01 ENCOUNTER — Ambulatory Visit: Payer: PPO

## 2017-04-02 ENCOUNTER — Other Ambulatory Visit: Payer: Self-pay | Admitting: Family Medicine

## 2017-04-19 ENCOUNTER — Ambulatory Visit (INDEPENDENT_AMBULATORY_CARE_PROVIDER_SITE_OTHER): Payer: PPO

## 2017-04-19 ENCOUNTER — Ambulatory Visit: Payer: PPO | Admitting: Family Medicine

## 2017-04-19 VITALS — BP 124/78 | HR 86 | Temp 97.9°F | Ht 65.0 in | Wt 109.8 lb

## 2017-04-19 DIAGNOSIS — Z01419 Encounter for gynecological examination (general) (routine) without abnormal findings: Secondary | ICD-10-CM | POA: Insufficient documentation

## 2017-04-19 DIAGNOSIS — Z0000 Encounter for general adult medical examination without abnormal findings: Secondary | ICD-10-CM

## 2017-04-19 DIAGNOSIS — Z23 Encounter for immunization: Secondary | ICD-10-CM | POA: Diagnosis not present

## 2017-04-19 MED ORDER — VALSARTAN 40 MG PO TABS: 20.0000 mg | ORAL_TABLET | Freq: Every day | ORAL | 6 refills | Status: DC

## 2017-04-19 NOTE — Progress Notes (Signed)
PCP notes:   Health maintenance:  Flu vaccine - administered  Abnormal screenings:   Fall risk - hx of 2 falls; pt states they were medication related Depression score: 3 (recent death of spouse)  Patient concerns:   Medication management - pt wants to discuss Diovan with PCP at next appt  Nurse concerns:  None  Next PCP appt:   04/19/17 @ 1200

## 2017-04-19 NOTE — Progress Notes (Signed)
Pre visit review using our clinic review tool, if applicable. No additional management support is needed unless otherwise documented below in the visit note. 

## 2017-04-19 NOTE — Patient Instructions (Signed)
Bianca [name] , Thank you for taking time to come for your Medicare Wellness Visit. I appreciate your ongoing commitment to your health goals. Please review the following plan we discussed and let me know if I can assist you in the future.   These are the goals we discussed: Goals    . Gain weight          Starting 04/19/17, I will continue to drink 2 cans of Boost in an effort to gain weight.        This is a list of the screening recommended for you and due dates:  Health Maintenance  Topic Date Due  . Tetanus Vaccine  12/26/2018  . Flu Shot  Completed  . DEXA scan (bone density measurement)  Completed  . Pneumonia vaccines  Completed   Preventive Care for Adults  A healthy lifestyle and preventive care can promote health and wellness. Preventive health guidelines for adults include the following key practices.  . A routine yearly physical is a good way to check with your health care provider about your health and preventive screening. It is a chance to share any concerns and updates on your health and to receive a thorough exam.  . Visit your dentist for a routine exam and preventive care every 6 months. Brush your teeth twice a day and floss once a day. Good oral hygiene prevents tooth decay and gum disease.  . The frequency of eye exams is based on your age, health, family medical history, use  of contact lenses, and other factors. Follow your health care provider's ecommendations for frequency of eye exams.  . Eat a healthy diet. Foods like vegetables, fruits, whole grains, low-fat dairy products, and lean protein foods contain the nutrients you need without too many calories. Decrease your intake of foods high in solid fats, added sugars, and salt. Eat the right amount of calories for you. Get information about a proper diet from your health care provider, if necessary.  . Regular physical exercise is one of the most important things you can do for your health. Most adults should  get at least 150 minutes of moderate-intensity exercise (any activity that increases your heart rate and causes you to sweat) each week. In addition, most adults need muscle-strengthening exercises on 2 or more days a week.  Silver Sneakers may be a benefit available to you. To determine eligibility, you may visit the website: www.silversneakers.com or contact program at 6843685158 Mon-Fri between 8AM-8PM.   . Maintain a healthy weight. The body mass index (BMI) is a screening tool to identify possible weight problems. It provides an estimate of body fat based on height and weight. Your health care provider can find your BMI and can help you achieve or maintain a healthy weight.   For adults 20 years and older: ? A BMI below 18.5 is considered underweight. ? A BMI of 18.5 to 24.9 is normal. ? A BMI of 25 to 29.9 is considered overweight. ? A BMI of 30 and above is considered obese.   . Maintain normal blood lipids and cholesterol levels by exercising and minimizing your intake of saturated fat. Eat a balanced diet with plenty of fruit and vegetables. Blood tests for lipids and cholesterol should begin at age 31 and be repeated every 5 years. If your lipid or cholesterol levels are high, you are over 50, or you are at high risk for heart disease, you may need your cholesterol levels checked more frequently. Ongoing high lipid  and cholesterol levels should be treated with medicines if diet and exercise are not working.  . If you smoke, find out from your health care provider how to quit. If you do not use tobacco, please do not start.  . If you choose to drink alcohol, please do not consume more than 2 drinks per day. One drink is considered to be 12 ounces (355 mL) of beer, 5 ounces (148 mL) of wine, or 1.5 ounces (44 mL) of liquor.  . If you are 4-77 years old, ask your health care provider if you should take aspirin to prevent strokes.  . Use sunscreen. Apply sunscreen liberally and  repeatedly throughout the day. You should seek shade when your shadow is shorter than you. Protect yourself by wearing long sleeves, pants, a wide-brimmed hat, and sunglasses year round, whenever you are outdoors.  . Once a month, do a whole body skin exam, using a mirror to look at the skin on your back. Tell your health care provider of new moles, moles that have irregular borders, moles that are larger than a pencil eraser, or moles that have changed in shape or color.

## 2017-04-19 NOTE — Progress Notes (Signed)
Subjective:   Bianca [name] is a 76 y.o. female who presents for Medicare Annual (Subsequent) preventive examination.  Review of Systems:  N/A Cardiac Risk Factors include: smoking/ tobacco exposure;hypertension;advanced age (>89men, >5 women)     Objective:     Vitals: BP 124/78 (BP Location: Right Arm, Patient Position: Sitting, Cuff Size: Normal)   Pulse 86   Temp 97.9 F (36.6 C) (Oral)   Ht 5\' 5"  (1.651 m) Comment: no shoes  Wt 109 lb 12 oz (49.8 kg)   SpO2 99%   BMI 18.26 kg/m   Body mass index is 18.26 kg/m.   Tobacco History  Smoking Status  . Current Some Day Smoker  . Packs/day: 0.50  . Types: Cigarettes  Smokeless Tobacco  . Never Used     Ready to quit: No Counseling given: No   Past Medical History:  Diagnosis Date  . Anxiety   . Arthritis   . Asthma   . COPD (chronic obstructive pulmonary disease) ([place])   . HOH (hard of hearing)   . Hypertension   . Wears dentures    top and partial bottom  . Wears glasses    Past Surgical History:  Procedure Laterality Date  . APPENDECTOMY    . BREAST LUMPECTOMY WITH NEEDLE LOCALIZATION Right 04/21/2013   Procedure: BREAST LUMPECTOMY WITH NEEDLE LOCALIZATION;  Surgeon: Harl Bowie, MD;  Location: [facility];  Service: General;  Laterality: Right;  . BREAST SURGERY  1985   cyst rt br  . MASTECTOMY W/ SENTINEL NODE BIOPSY Left 04/21/2013   Procedure: MASTECTOMY WITH SENTINEL LYMPH NODE BIOPSY;  Surgeon: Harl Bowie, MD;  Location: [facility];  Service: General;  Laterality: Left;  . OTHER SURGICAL HISTORY     cysts removed from foot  . OTHER SURGICAL HISTORY     cysts removed from breast  . TUBAL LIGATION     History reviewed. No pertinent family history. History  Sexual Activity  . Sexual activity: Not on file    Comment: 1-2 daily    Outpatient Encounter Prescriptions as of 04/19/2017  Medication Sig  . ADVAIR DISKUS 100-50 MCG/DOSE AEPB INHALE 1  PUFF TWICE A DAY AS DIRECTED **RINSE MOUTH AFTER USE**  . albuterol (PROAIR HFA) 108 (90 Base) MCG/ACT inhaler Inhale 1-2 puffs into the lungs every 4 (four) hours as needed.  Marland Kitchen alendronate (FOSAMAX) 70 MG tablet Take 1 tablet (70 mg total) by mouth every 7 (seven) days. Take with a full glass of water on an empty stomach.  . busPIRone (BUSPAR) 15 MG tablet TAKE ONE TABLET TWICE DAILY  . CALCIUM PO Take by mouth.  . Cholecalciferol (VITAMIN D-3 PO) Take by mouth.  . fexofenadine (ALLEGRA) 180 MG tablet Take 180 mg by mouth as directed.    . lactose free nutrition (BOOST) LIQD Take 237 mLs by mouth 2 (two) times daily between meals.  . Multiple Vitamin (MULTIVITAMIN) tablet Take 1 tablet by mouth daily.    . Omega-3 Fatty Acids (OMEGA 3 PO) Take by mouth.  Marland Kitchen UNABLE TO FIND Rx: P8242- Post Surgical Bras (Quantity: 6)\ Dx: 174.9; Left mastectomy  . [DISCONTINUED] doxycycline (VIBRA-TABS) 100 MG tablet Take 1 tablet (100 mg total) by mouth 2 (two) times daily.  . [DISCONTINUED] valsartan (DIOVAN) 40 MG tablet TAKE ONE TABLET BY MOUTH EVERY DAY. COMPLETED PHYSICAL EXAM REQUIRED FOR MORE REFILLS.  Marland Kitchen benzonatate (TESSALON) 200 MG capsule Take 1 capsule (200 mg total) by mouth 3 (three)  times daily as needed. Swallow whole, do not bite pill (Patient not taking: Reported on 04/19/2017)  . guaiFENesin-codeine (ROBITUSSIN AC) 100-10 MG/5ML syrup Take 5 mLs by mouth 4 (four) times daily as needed for cough. (Patient not taking: Reported on 04/19/2017)   No facility-administered encounter medications on file as of 04/19/2017.     Activities of Daily Living In your present state of health, do you have any difficulty performing the following activities: 04/19/2017  Hearing? Y  Vision? N  Difficulty concentrating or making decisions? N  Walking or climbing stairs? N  Dressing or bathing? N  Doing errands, shopping? N  Preparing Food and eating ? N  Using the Toilet? N  In the past six months, have you  accidently leaked urine? N  Do you have problems with loss of bowel control? N  Managing your Medications? N  Managing your Finances? N  Housekeeping or managing your Housekeeping? N  Some recent data might be hidden    Patient Care Team: Lucille Passy, MD as PCP - General Magrinat, Virgie Dad, MD as Consulting Physician (Oncology) Coralie Keens, MD as Consulting Physician (General Surgery)    Assessment:      Hearing Screening   125Hz  250Hz  500Hz  1000Hz  2000Hz  3000Hz  4000Hz  6000Hz  8000Hz   Right ear:   40 0 0  0    Left ear:   40 0 0  0      Visual Acuity Screening   Right eye Left eye Both eyes  Without correction:     With correction: 20/40 20/30 20/30-1    Exercise Activities and Dietary recommendations Current Exercise Habits: The patient does not participate in regular exercise at present, Exercise limited by: None identified  Goals    . Gain weight          Starting 04/19/17, I will continue to drink 2 cans of Boost in an effort to gain weight.       Fall Risk Fall Risk  04/19/2017 06/17/2015  Falls in the past year? Yes No  Comment medication related falls; states BP gets too low -  Number falls in past yr: 2 or more -  Injury with Fall? Yes -   Depression Screen PHQ 2/9 Scores 04/19/2017 06/17/2015  PHQ - 2 Score 1 0  PHQ- 9 Score 3 -     Cognitive Function MMSE - Mini Mental State Exam 04/19/2017  Orientation to time 5  Orientation to Place 5  Registration 3  Attention/ Calculation 0  Recall 3  Language- name 2 objects 0  Language- repeat 1  Language- follow 3 step command 3  Language- read & follow direction 0  Write a sentence 0  Copy design 0  Total score 20     PLEASE NOTE: A Mini-Cog screen was completed. Maximum score is 20. A value of 0 denotes this part of [place] MMSE was not completed or the patient failed this part of the Mini-Cog screening.   Mini-Cog Screening Orientation to Time - Max 5 pts Orientation to Place - Max 5  pts Registration - Max 3 pts Recall - Max 3 pts Language Repeat - Max 1 pts Language Follow 3 Step Command - Max 3 pts     Immunization History  Administered Date(s) Administered  . Influenza Split 05/12/2011  . Influenza, High Dose Seasonal PF 05/17/2015  . Influenza,inj,Quad PF,6+ Mos 04/24/2016, 04/19/2017  . Influenza-Unspecified 05/20/2013, 05/03/2014  . Pneumococcal Conjugate-13 10/26/2013  . Pneumococcal Polysaccharide-23 12/03/2014  . Td 09/18/1994,  12/25/2008  . Zoster 10/06/2008   Screening Tests Health Maintenance  Topic Date Due  . TETANUS/TDAP  12/26/2018  . INFLUENZA VACCINE  Completed  . DEXA SCAN  Completed  . PNA vac Low Risk Adult  Completed      Plan:     I have personally reviewed and addressed the Medicare Annual Wellness questionnaire and have noted the following in the patient's chart:  A. Medical and social history B. Use of alcohol, tobacco or illicit drugs  C. Current medications and supplements D. Functional ability and status E.  Nutritional status F.  Physical activity G. Advance directives H. List of other physicians I.  Hospitalizations, surgeries, and ER visits in previous 12 months J.  [place] to include hearing, vision, cognitive, depression L. Referrals and appointments - none  In addition, I have reviewed and discussed with patient certain preventive protocols, quality metrics, and best practice recommendations. A written personalized care plan for preventive services as well as general preventive health recommendations were provided to patient.  See attached scanned questionnaire for additional information.   Signed,   Lindell Noe, MHA, BS, LPN Health Coach

## 2017-04-27 ENCOUNTER — Ambulatory Visit (INDEPENDENT_AMBULATORY_CARE_PROVIDER_SITE_OTHER): Payer: PPO | Admitting: Family Medicine

## 2017-04-27 DIAGNOSIS — J440 Chronic obstructive pulmonary disease with acute lower respiratory infection: Secondary | ICD-10-CM | POA: Diagnosis not present

## 2017-04-27 DIAGNOSIS — J209 Acute bronchitis, unspecified: Secondary | ICD-10-CM

## 2017-04-27 MED ORDER — PREDNISONE 20 MG PO TABS: 20.0000 mg | ORAL_TABLET | Freq: Every day | ORAL | 0 refills | Status: DC

## 2017-04-27 MED ORDER — AZITHROMYCIN 250 MG PO TABS: ORAL_TABLET | ORAL | 0 refills | Status: AC

## 2017-04-27 NOTE — Progress Notes (Signed)
SUBJECTIVE:  Bianca [name] is a 76 y.o. female who complains of coryza, congestion and productive cough for 7 days. She denies a history of chest pain and chills and admits to a history of asthma. Patient admits to smoke cigarettes.  She is using her inhalers.   Current Outpatient Prescriptions on File Prior to Visit  Medication Sig Dispense Refill  . ADVAIR DISKUS 100-50 MCG/DOSE AEPB INHALE 1 PUFF TWICE A DAY AS DIRECTED **RINSE MOUTH AFTER USE** 60 each 5  . albuterol (PROAIR HFA) 108 (90 Base) MCG/ACT inhaler Inhale 1-2 puffs into the lungs every 4 (four) hours as needed. 8.5 each 1  . alendronate (FOSAMAX) 70 MG tablet Take 1 tablet (70 mg total) by mouth every 7 (seven) days. Take with a full glass of water on an empty stomach. 4 tablet 11  . busPIRone (BUSPAR) 15 MG tablet TAKE ONE TABLET TWICE DAILY 60 tablet 5  . CALCIUM PO Take by mouth.    . Cholecalciferol (VITAMIN D-3 PO) Take by mouth.    . fexofenadine (ALLEGRA) 180 MG tablet Take 180 mg by mouth as directed.      . lactose free nutrition (BOOST) LIQD Take 237 mLs by mouth 2 (two) times daily between meals.    . Multiple Vitamin (MULTIVITAMIN) tablet Take 1 tablet by mouth daily.      . Omega-3 Fatty Acids (OMEGA 3 PO) Take by mouth.    Marland Kitchen UNABLE TO FIND Rx: L8000- Post Surgical Bras (Quantity: 6)\ Dx: 174.9; Left mastectomy 1 each 0  . valsartan (DIOVAN) 40 MG tablet Take 0.5 tablets (20 mg total) by mouth daily. 30 tablet 6   No current facility-administered medications on file prior to visit.     No Known Allergies  Past Medical History:  Diagnosis Date  . Anxiety   . Arthritis   . Asthma   . COPD (chronic obstructive pulmonary disease) ([place])   . HOH (hard of hearing)   . Hypertension   . Wears dentures    top and partial bottom  . Wears glasses     Past Surgical History:  Procedure Laterality Date  . APPENDECTOMY    . BREAST LUMPECTOMY WITH NEEDLE LOCALIZATION Right 04/21/2013   Procedure: BREAST  LUMPECTOMY WITH NEEDLE LOCALIZATION;  Surgeon: Harl Bowie, MD;  Location: [facility];  Service: General;  Laterality: Right;  . BREAST SURGERY  1985   cyst rt br  . MASTECTOMY W/ SENTINEL NODE BIOPSY Left 04/21/2013   Procedure: MASTECTOMY WITH SENTINEL LYMPH NODE BIOPSY;  Surgeon: Harl Bowie, MD;  Location: [facility];  Service: General;  Laterality: Left;  . OTHER SURGICAL HISTORY     cysts removed from foot  . OTHER SURGICAL HISTORY     cysts removed from breast  . TUBAL LIGATION      No family history on file.  Social History   Social History  . Marital status: Married    Spouse name: N/A  . Number of children: N/A  . Years of education: N/A   Occupational History  . retired Retired   Social History Main Topics  . Smoking status: Current Some Day Smoker    Packs/day: 0.50    Types: Cigarettes  . Smokeless tobacco: Never Used  . Alcohol use No  . Drug use: No  . Sexual activity: Not on file     Comment: 1-2 daily   Other Topics Concern  . Not on file   Social History  Narrative   Has a living will      The PMH, PSH, Social History, Family History, Medications, and allergies have been reviewed in [facility], and have been updated if relevant.  OBJECTIVE: There were no vitals taken for this visit.  She appears well, vital signs are as noted. Ears normal.  Throat and pharynx normal.  Neck supple. No adenopathy in the neck. Nose is congested. Sinuses non tender. Wheezes throughout.  ASSESSMENT:  viral upper respiratory illness  PLAN:  Zpack, prednisone burst as direct. Call or return to clinic prn if these symptoms worsen or fail to improve as anticipated. The patient indicates understanding of these issues and agrees with the plan.

## 2017-04-27 NOTE — Patient Instructions (Signed)
Good to see you.  Take zpack as directed and your prednisone- one tablet daily with breakfast for 7 days.

## 2017-05-08 ENCOUNTER — Other Ambulatory Visit: Payer: Self-pay | Admitting: Family Medicine

## 2017-05-11 DIAGNOSIS — I10 Essential (primary) hypertension: Secondary | ICD-10-CM | POA: Diagnosis not present

## 2017-05-11 DIAGNOSIS — Z720 Tobacco use: Secondary | ICD-10-CM | POA: Diagnosis not present

## 2017-05-11 DIAGNOSIS — N183 Chronic kidney disease, stage 3 (moderate): Secondary | ICD-10-CM | POA: Diagnosis not present

## 2017-05-11 DIAGNOSIS — N261 Atrophy of kidney (terminal): Secondary | ICD-10-CM | POA: Diagnosis not present

## 2017-05-19 NOTE — Progress Notes (Signed)
I reviewed health advisor's note, was available for consultation, and agree with documentation and plan.  

## 2017-05-31 DIAGNOSIS — D0512 Intraductal carcinoma in situ of left breast: Secondary | ICD-10-CM | POA: Diagnosis not present

## 2017-07-06 ENCOUNTER — Telehealth: Payer: Self-pay | Admitting: Family Medicine

## 2017-07-06 ENCOUNTER — Other Ambulatory Visit: Payer: Self-pay

## 2017-07-06 MED ORDER — BUSPIRONE HCL 10 MG PO TABS: 15.0000 mg | ORAL_TABLET | Freq: Two times a day (BID) | ORAL | 2 refills | Status: DC

## 2017-07-06 NOTE — Progress Notes (Signed)
The only doses of Buspirone available are 5mg , 7.5mg , & 10mg /per verbal via TA ok to change to equal current dosage of 15mg  bid/changed to 10mg  1.5 bid and sent to pharmacy/thx dmf

## 2017-07-06 NOTE — Telephone Encounter (Signed)
See encounter/this has been resolved/thx dmf

## 2017-07-06 NOTE — Telephone Encounter (Signed)
Pharmacy is calling to let provider know that Buspar is on back order- is there an alternative medication for the patient that they would like to send in?

## 2017-07-26 ENCOUNTER — Other Ambulatory Visit: Payer: Self-pay | Admitting: Family Medicine

## 2017-09-02 DIAGNOSIS — R922 Inconclusive mammogram: Secondary | ICD-10-CM | POA: Diagnosis not present

## 2017-09-02 DIAGNOSIS — Z853 Personal history of malignant neoplasm of breast: Secondary | ICD-10-CM | POA: Diagnosis not present

## 2017-09-22 ENCOUNTER — Other Ambulatory Visit: Payer: Self-pay | Admitting: Family Medicine

## 2017-10-08 DIAGNOSIS — H40013 Open angle with borderline findings, low risk, bilateral: Secondary | ICD-10-CM | POA: Diagnosis not present

## 2017-10-08 DIAGNOSIS — H52223 Regular astigmatism, bilateral: Secondary | ICD-10-CM | POA: Diagnosis not present

## 2017-10-08 DIAGNOSIS — H40053 Ocular hypertension, bilateral: Secondary | ICD-10-CM | POA: Diagnosis not present

## 2017-10-08 DIAGNOSIS — H5203 Hypermetropia, bilateral: Secondary | ICD-10-CM | POA: Diagnosis not present

## 2017-10-08 DIAGNOSIS — H40059 Ocular hypertension, unspecified eye: Secondary | ICD-10-CM | POA: Diagnosis not present

## 2017-10-08 DIAGNOSIS — H25813 Combined forms of age-related cataract, bilateral: Secondary | ICD-10-CM | POA: Diagnosis not present

## 2017-10-11 DIAGNOSIS — C50912 Malignant neoplasm of unspecified site of left female breast: Secondary | ICD-10-CM | POA: Diagnosis not present

## 2017-10-11 DIAGNOSIS — Z9011 Acquired absence of right breast and nipple: Secondary | ICD-10-CM | POA: Diagnosis not present

## 2017-10-29 DIAGNOSIS — Z9011 Acquired absence of right breast and nipple: Secondary | ICD-10-CM | POA: Diagnosis not present

## 2017-10-29 DIAGNOSIS — C50912 Malignant neoplasm of unspecified site of left female breast: Secondary | ICD-10-CM | POA: Diagnosis not present

## 2017-12-06 ENCOUNTER — Encounter: Payer: Self-pay | Admitting: Family Medicine

## 2017-12-06 ENCOUNTER — Ambulatory Visit: Payer: PPO | Admitting: Family Medicine

## 2017-12-06 ENCOUNTER — Telehealth: Payer: Self-pay | Admitting: Family Medicine

## 2017-12-06 DIAGNOSIS — R636 Underweight: Secondary | ICD-10-CM

## 2017-12-06 DIAGNOSIS — J438 Other emphysema: Secondary | ICD-10-CM

## 2017-12-06 DIAGNOSIS — I10 Essential (primary) hypertension: Secondary | ICD-10-CM

## 2017-12-06 MED ORDER — MIRTAZAPINE 15 MG PO TABS: 15.0000 mg | ORAL_TABLET | Freq: Every day | ORAL | 3 refills | Status: DC

## 2017-12-06 MED ORDER — FLUTICASONE-SALMETEROL 100-50 MCG/DOSE IN AEPB: INHALATION_SPRAY | RESPIRATORY_TRACT | 5 refills | Status: DC

## 2017-12-06 MED ORDER — DRONABINOL 2.5 MG PO CAPS: 2.5000 mg | ORAL_CAPSULE | Freq: Two times a day (BID) | ORAL | 2 refills | Status: DC

## 2017-12-06 NOTE — Assessment & Plan Note (Signed)
Well controlled.  Discussed with pt. I do not think her weakness is from valsartan- she has been on this since 2012 and we have actually decreased dose.  Likely due to calorie malnutrition.  See below.

## 2017-12-06 NOTE — Telephone Encounter (Signed)
TA-Plz see note below/pt would like an alternative to Marinol caps as it will cost her $152.00 out of pocket/plz advise/thx dmf

## 2017-12-06 NOTE — Telephone Encounter (Signed)
Yes let's try Remeron instead.  It can help with appetite, sleep and mood.  eRx sent.

## 2017-12-06 NOTE — Assessment & Plan Note (Signed)
Discussed importance of drinking her boost shakes. Add Marinol 2.5 mg twice daily. Follow up in 1-2 months. The patient indicates understanding of these issues and agrees with the plan.

## 2017-12-06 NOTE — Patient Instructions (Signed)
Great to see you. We are starting marinol 2.5 mg twice daily.  Please come see me in 1-2 months.

## 2017-12-06 NOTE — Assessment & Plan Note (Signed)
No recent exacerbations. Continue current rxs.  No changes made.

## 2017-12-06 NOTE — Progress Notes (Signed)
Subjective:   Patient ID: Bianca [name], female    DOB: 08/03/1940, 77 y.o.   MRN: [id number]  Bianca [name] is a pleasant 77 y.o. year old female who presents to clinic today with Hypertension (Patient is here today for a 15-month-F/U HTN.  She would like to discuss changing her current medication Valsartan to something else.  She states that she is taking 1/2 of the 40mg  tablet and in the mornings when she wakes she feels like she is drunk and like her legs don't want to move.) and COPD (Patient is also here today to F/U with COPD.  She currently uses Advair Diskus and states that it is well controlled on this and wishes to continue.)  on 12/06/2017  HPI:  HTN- has been well controlled on lower dose of Valsartan.  She does wake up and feels a little shaky and drunk.   She wonders if it is from [place].  She does not eat much.  Admits to not having much of an appetite.  Last time we discussed drinking supplement shakes- she was advised to drink at least 2 boost per day.  She admits to not drinking even one every day. She doesn't feel she is depressed but feels her appetite is even lower since her husband died.  "I never really did eat much."   COPD- Stable on current rxs.    Current Outpatient Medications on File Prior to Visit  Medication Sig Dispense Refill  . ADVAIR DISKUS 100-50 MCG/DOSE AEPB TAKE 1 PUFF TWICE A DAY AS DIRECTED RINSE MOUTH AFTER USE 60 each 2  . alendronate (FOSAMAX) 70 MG tablet TAKE ONE TABLET BY MOUTH EVERY 7 DAYS. TAKE WITH A FULL GLASS OF WATER ON EMPTYSTOMACH 4 tablet 11  . busPIRone (BUSPAR) 10 MG tablet Take 1.5 tablets (15 mg total) by mouth 2 (two) times daily. 90 tablet 2  . CALCIUM PO Take by mouth.    . Cholecalciferol (VITAMIN D-3 PO) Take by mouth.    . fexofenadine (ALLEGRA) 180 MG tablet Take 180 mg by mouth as directed.      . lactose free nutrition (BOOST) LIQD Take 237 mLs by mouth 2 (two) times daily between meals.    . Multiple Vitamin  (MULTIVITAMIN) tablet Take 1 tablet by mouth daily.      . Omega-3 Fatty Acids (OMEGA 3 PO) Take by mouth.    Marland Kitchen PROAIR HFA 108 (90 Base) MCG/ACT inhaler INHALE 1-2 PUFFS INTO THE LUNGS EVERY 4 HOURS AS NEEDED 8.5 g 3  . 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 valsartan (DIOVAN) 40 MG tablet Take 0.5 tablets (20 mg total) by mouth daily. 30 tablet 6   No current facility-administered medications on file prior to visit.     No Known Allergies  Past Medical History:  Diagnosis Date  . Anxiety   . Arthritis   . Asthma   . COPD (chronic obstructive pulmonary disease) ([place])   . HOH (hard of hearing)   . Hypertension   . Wears dentures    top and partial bottom  . Wears glasses     Past Surgical History:  Procedure Laterality Date  . APPENDECTOMY    . BREAST LUMPECTOMY WITH NEEDLE LOCALIZATION Right 04/21/2013   Procedure: BREAST LUMPECTOMY WITH NEEDLE LOCALIZATION;  Surgeon: Harl Bowie, MD;  Location: [facility];  Service: General;  Laterality: Right;  . BREAST SURGERY  1985   cyst rt br  . MASTECTOMY W/ SENTINEL NODE BIOPSY Left 04/21/2013   Procedure: MASTECTOMY WITH SENTINEL LYMPH NODE BIOPSY;  Surgeon: Harl Bowie, MD;  Location: [facility];  Service: General;  Laterality: Left;  . OTHER SURGICAL HISTORY     cysts removed from foot  . OTHER SURGICAL HISTORY     cysts removed from breast  . TUBAL LIGATION      No family history on file.  Social History   Socioeconomic History  . Marital status: Married    Spouse name: Not on file  . Number of children: Not on file  . Years of education: Not on file  . Highest education level: Not on file  Occupational History  . Occupation: retired    Fish farm manager: RETIRED  Social Needs  . Financial resource strain: Not on file  . Food insecurity:    Worry: Not on file    Inability: Not on file  . Transportation needs:    Medical:  Not on file    Non-medical: Not on file  Tobacco Use  . Smoking status: Current Some Day Smoker    Packs/day: 0.50    Types: Cigarettes  . Smokeless tobacco: Never Used  Substance and Sexual Activity  . Alcohol use: No  . Drug use: No  . Sexual activity: Not on file    Comment: 1-2 daily  Lifestyle  . Physical activity:    Days per week: Not on file    Minutes per session: Not on file  . Stress: Not on file  Relationships  . Social connections:    Talks on phone: Not on file    Gets together: Not on file    Attends religious service: Not on file    Active member of club or organization: Not on file    Attends meetings of clubs or organizations: Not on file    Relationship status: Not on file  . Intimate partner violence:    Fear of current or ex partner: Not on file    Emotionally abused: Not on file    Physically abused: Not on file    Forced sexual activity: Not on file  Other Topics Concern  . Not on file  Social History Narrative   Has a living will   The PMH, PSH, Social History, Family History, Medications, and allergies have been reviewed in [facility], and have been updated if relevant.   Review of Systems  Constitutional: Positive for appetite change. Negative for unexpected weight change.  Respiratory: Negative.   Cardiovascular: Negative.   Gastrointestinal: Negative.   Endocrine: Negative.   Genitourinary: Negative.   Musculoskeletal: Negative.   Allergic/Immunologic: Negative.   Neurological: Positive for weakness.  Hematological: Negative.   Psychiatric/Behavioral: Negative.   All other systems reviewed and are negative.      Objective:    BP 136/60 (BP Location: Right Arm, Patient Position: Sitting, Cuff Size: Normal)   Pulse 87   Temp 97.9 F (36.6 C) (Oral)   Ht 5\' 5"  (1.651 m)   Wt 108 lb 6.4 oz (49.2 kg)   SpO2 97%   BMI 18.04 kg/m   Wt Readings from Last 3 Encounters:  12/06/17 108 lb 6.4 oz (49.2 kg)  04/19/17 109 lb 12 oz (49.8 kg)    04/19/17 109 lb 12 oz (49.8 kg)    Physical Exam  Constitutional: She is oriented to person, place, and time.  thin  HENT:  Head: Normocephalic and atraumatic.  Eyes: EOM are normal.  Cardiovascular: Normal rate and regular rhythm.  Pulmonary/Chest: Effort normal and breath sounds normal.  Musculoskeletal: Normal range of motion. She exhibits no edema.  Neurological: She is alert and oriented to person, place, and time. No cranial nerve deficit.  Skin: Skin is warm and dry.  Psychiatric: She has a normal mood and affect. Her behavior is normal. Judgment and thought content normal.  Nursing note and vitals reviewed.         Assessment & Plan:   Underweight due to inadequate caloric intake No follow-ups on file.

## 2017-12-06 NOTE — Telephone Encounter (Signed)
Copied from [place] [id number]. Topic: Quick Communication - See Telephone Encounter >> Dec 06, 2017  4:01 PM Rutherford Nail, NT wrote: CRM for notification. See Telephone encounter for: 12/06/17. Patient states that the dronabinol (MARINOL) 2.5 MG capsule is going to be $152. Would like to know if another prescription could be called in that would be cheaper?  [place], [place] CB#: [phone]

## 2017-12-07 NOTE — Telephone Encounter (Signed)
LMOVM that a replacement Rx has been sent to pharm and if has any questions may call/PEC ok to give this information about Dr. Deborra Medina sending in Remeron as a replacement which can help with appetite, sleep and mood/thx dmf

## 2018-01-14 DIAGNOSIS — C4441 Basal cell carcinoma of skin of scalp and neck: Secondary | ICD-10-CM | POA: Diagnosis not present

## 2018-01-14 DIAGNOSIS — Z85828 Personal history of other malignant neoplasm of skin: Secondary | ICD-10-CM | POA: Diagnosis not present

## 2018-02-07 ENCOUNTER — Encounter: Payer: Self-pay | Admitting: Family Medicine

## 2018-02-07 ENCOUNTER — Ambulatory Visit: Payer: PPO | Admitting: Family Medicine

## 2018-02-07 VITALS — BP 130/66 | HR 88 | Temp 98.0°F | Ht 65.0 in | Wt 112.8 lb

## 2018-02-07 DIAGNOSIS — R636 Underweight: Secondary | ICD-10-CM

## 2018-02-07 MED ORDER — MIRTAZAPINE 15 MG PO TABS
15.0000 mg | ORAL_TABLET | Freq: Every day | ORAL | 3 refills | Status: DC
Start: 2018-02-07 — End: 2018-12-15

## 2018-02-07 NOTE — Assessment & Plan Note (Signed)
Improving, gaining weight and feels her appetite has improved with remeron.  She would like to continue to current dose.  eRx refills sent.

## 2018-02-07 NOTE — Progress Notes (Signed)
Subjective:   Patient ID: Bianca [name], female    DOB: Nov 04, 1940, 77 y.o.   MRN: [id number]  Bianca [name] is a pleasant 77 y.o. year old female who presents to clinic today with Follow-up (Patient is here today to F/U with unintentional weight loss.  On 5.20.19 her weight was 108. She was advised of the importance of 2 boost shakes a day and Marinol2.5mg  bid was added.  This had to be changed to Remeron 15mg  1qhs due to the cost.  Today she weighs 112.8.  States that she has not needed to drink the boost bid since she started eating more.  )  on 02/07/2018  HPI:  Unintentional weight loss- Saw her on 12/06/17 and had continued to lose weight since her husband died. Note reviewed. She had tried drinking boost but admitted to just having little appetite.  Advised to drink 2 boosts per day and started her on Marinol but insurance wouldn't cover the marinol.  Therefore started her on remeron 15 mg qhs.  She feels the remeron is working very well.  "I could eat the hinges off of a door!"  Appetite has improved dramatically.  Still only drinking one boost per day but she is eating much more. Sleeping well.  Wt Readings from Last 3 Encounters:  02/07/18 112 lb 12.8 oz (51.2 kg)  12/06/17 108 lb 6.4 oz (49.2 kg)  04/19/17 109 lb 12 oz (49.8 kg)     Current Outpatient Medications on File Prior to Visit  Medication Sig Dispense Refill  . alendronate (FOSAMAX) 70 MG tablet TAKE ONE TABLET BY MOUTH EVERY 7 DAYS. TAKE WITH A FULL GLASS OF WATER ON EMPTYSTOMACH 4 tablet 11  . busPIRone (BUSPAR) 10 MG tablet Take 1.5 tablets (15 mg total) by mouth 2 (two) times daily. 90 tablet 2  . CALCIUM PO Take by mouth.    . Cholecalciferol (VITAMIN D-3 PO) Take by mouth.    . fexofenadine (ALLEGRA) 180 MG tablet Take 180 mg by mouth as directed.      . Fluticasone-Salmeterol (ADVAIR DISKUS) 100-50 MCG/DOSE AEPB TAKE 1 PUFF TWICE A DAY AS DIRECTED RINSE MOUTH AFTER USE 60 each 5  . lactose free  nutrition (BOOST) LIQD Take 237 mLs by mouth 2 (two) times daily between meals.    . mirtazapine (REMERON) 15 MG tablet Take 1 tablet (15 mg total) by mouth at bedtime. 30 tablet 3  . Multiple Vitamin (MULTIVITAMIN) tablet Take 1 tablet by mouth daily.      . Omega-3 Fatty Acids (OMEGA 3 PO) Take by mouth.    Marland Kitchen PROAIR HFA 108 (90 Base) MCG/ACT inhaler INHALE 1-2 PUFFS INTO THE LUNGS EVERY 4 HOURS AS NEEDED 8.5 g 3  . 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 valsartan (DIOVAN) 40 MG tablet Take 0.5 tablets (20 mg total) by mouth daily. 30 tablet 6   No current facility-administered medications on file prior to visit.     No Known Allergies  Past Medical History:  Diagnosis Date  . Anxiety   . Arthritis   . Asthma   . COPD (chronic obstructive pulmonary disease) ([place])   . HOH (hard of hearing)   . Hypertension   . Wears dentures    top and partial bottom  . Wears glasses     Past Surgical History:  Procedure Laterality Date  . APPENDECTOMY    . BREAST LUMPECTOMY WITH NEEDLE  LOCALIZATION Right 04/21/2013   Procedure: BREAST LUMPECTOMY WITH NEEDLE LOCALIZATION;  Surgeon: Harl Bowie, MD;  Location: [facility];  Service: General;  Laterality: Right;  . BREAST SURGERY  1985   cyst rt br  . MASTECTOMY W/ SENTINEL NODE BIOPSY Left 04/21/2013   Procedure: MASTECTOMY WITH SENTINEL LYMPH NODE BIOPSY;  Surgeon: Harl Bowie, MD;  Location: [facility];  Service: General;  Laterality: Left;  . OTHER SURGICAL HISTORY     cysts removed from foot  . OTHER SURGICAL HISTORY     cysts removed from breast  . TUBAL LIGATION      No family history on file.  Social History   Socioeconomic History  . Marital status: Married    Spouse name: Not on file  . Number of children: Not on file  . Years of education: Not on file  . Highest education level: Not on file  Occupational History  .  Occupation: retired    Fish farm manager: RETIRED  Social Needs  . Financial resource strain: Not on file  . Food insecurity:    Worry: Not on file    Inability: Not on file  . Transportation needs:    Medical: Not on file    Non-medical: Not on file  Tobacco Use  . Smoking status: Current Some Day Smoker    Packs/day: 0.50    Types: Cigarettes  . Smokeless tobacco: Never Used  Substance and Sexual Activity  . Alcohol use: No  . Drug use: No  . Sexual activity: Not on file    Comment: 1-2 daily  Lifestyle  . Physical activity:    Days per week: Not on file    Minutes per session: Not on file  . Stress: Not on file  Relationships  . Social connections:    Talks on phone: Not on file    Gets together: Not on file    Attends religious service: Not on file    Active member of club or organization: Not on file    Attends meetings of clubs or organizations: Not on file    Relationship status: Not on file  . Intimate partner violence:    Fear of current or ex partner: Not on file    Emotionally abused: Not on file    Physically abused: Not on file    Forced sexual activity: Not on file  Other Topics Concern  . Not on file  Social History Narrative   Has a living will   The PMH, PSH, Social History, Family History, Medications, and allergies have been reviewed in [facility], and have been updated if relevant.   Review of Systems  Constitutional: Positive for appetite change.  Psychiatric/Behavioral: Negative.   All other systems reviewed and are negative.      Objective:    BP 130/66 (BP Location: Right Arm, Patient Position: Sitting, Cuff Size: Normal)   Pulse 88   Temp 98 F (36.7 C) (Oral)   Ht 5\' 5"  (1.651 m)   Wt 112 lb 12.8 oz (51.2 kg)   SpO2 96%   BMI 18.77 kg/m    Physical Exam  Constitutional: She is oriented to person, place, and time. She appears well-developed and well-nourished. No distress.  HENT:  Head: Normocephalic and atraumatic.  Eyes: Conjunctivae  and EOM are normal.  Neck: Normal range of motion.  Cardiovascular: Normal rate.  Pulmonary/Chest: Effort normal.  Musculoskeletal: Normal range of motion.  Neurological: She is alert and oriented to person,  place, and time. No cranial nerve deficit.  Skin: Skin is warm and dry. She is not diaphoretic.  Psychiatric: She has a normal mood and affect. Her behavior is normal. Judgment and thought content normal.  Nursing note and vitals reviewed.         Assessment & Plan:   Underweight due to inadequate caloric intake No follow-ups on file.

## 2018-02-07 NOTE — Patient Instructions (Signed)
Great to see you! Happy birthday!

## 2018-02-18 DIAGNOSIS — Z01419 Encounter for gynecological examination (general) (routine) without abnormal findings: Secondary | ICD-10-CM | POA: Diagnosis not present

## 2018-02-22 DIAGNOSIS — D2271 Melanocytic nevi of right lower limb, including hip: Secondary | ICD-10-CM | POA: Diagnosis not present

## 2018-02-22 DIAGNOSIS — L821 Other seborrheic keratosis: Secondary | ICD-10-CM | POA: Diagnosis not present

## 2018-02-22 DIAGNOSIS — Z85828 Personal history of other malignant neoplasm of skin: Secondary | ICD-10-CM | POA: Diagnosis not present

## 2018-02-22 DIAGNOSIS — D225 Melanocytic nevi of trunk: Secondary | ICD-10-CM | POA: Diagnosis not present

## 2018-02-28 DIAGNOSIS — H903 Sensorineural hearing loss, bilateral: Secondary | ICD-10-CM | POA: Diagnosis not present

## 2018-02-28 DIAGNOSIS — H9113 Presbycusis, bilateral: Secondary | ICD-10-CM | POA: Diagnosis not present

## 2018-04-15 DIAGNOSIS — H52223 Regular astigmatism, bilateral: Secondary | ICD-10-CM | POA: Diagnosis not present

## 2018-04-15 DIAGNOSIS — H5203 Hypermetropia, bilateral: Secondary | ICD-10-CM | POA: Diagnosis not present

## 2018-04-15 DIAGNOSIS — H40013 Open angle with borderline findings, low risk, bilateral: Secondary | ICD-10-CM | POA: Diagnosis not present

## 2018-04-15 DIAGNOSIS — H40053 Ocular hypertension, bilateral: Secondary | ICD-10-CM | POA: Diagnosis not present

## 2018-04-15 DIAGNOSIS — H40003 Preglaucoma, unspecified, bilateral: Secondary | ICD-10-CM | POA: Diagnosis not present

## 2018-04-15 DIAGNOSIS — H25813 Combined forms of age-related cataract, bilateral: Secondary | ICD-10-CM | POA: Diagnosis not present

## 2018-05-04 DIAGNOSIS — N261 Atrophy of kidney (terminal): Secondary | ICD-10-CM | POA: Diagnosis not present

## 2018-05-04 DIAGNOSIS — N183 Chronic kidney disease, stage 3 (moderate): Secondary | ICD-10-CM | POA: Diagnosis not present

## 2018-05-04 DIAGNOSIS — I129 Hypertensive chronic kidney disease with stage 1 through stage 4 chronic kidney disease, or unspecified chronic kidney disease: Secondary | ICD-10-CM | POA: Diagnosis not present

## 2018-05-04 DIAGNOSIS — Z720 Tobacco use: Secondary | ICD-10-CM | POA: Diagnosis not present

## 2018-05-04 LAB — HEPATIC FUNCTION PANEL: ALK PHOS: 3.1 — AB (ref 25–125)

## 2018-05-04 LAB — BASIC METABOLIC PANEL
BUN: 20 (ref 4–21)
Creatinine: 1.5 — AB (ref 0.5–1.1)
Glucose: 97
POTASSIUM: 4.4 (ref 3.4–5.3)
SODIUM: 140 (ref 137–147)

## 2018-05-05 ENCOUNTER — Other Ambulatory Visit: Payer: Self-pay | Admitting: Family Medicine

## 2018-05-12 ENCOUNTER — Encounter: Payer: Self-pay | Admitting: Family Medicine

## 2018-05-12 NOTE — Progress Notes (Signed)
[facility]/thx dmf

## 2018-05-25 ENCOUNTER — Other Ambulatory Visit: Payer: Self-pay | Admitting: Family Medicine

## 2018-06-07 DIAGNOSIS — Z9011 Acquired absence of right breast and nipple: Secondary | ICD-10-CM | POA: Diagnosis not present

## 2018-06-07 DIAGNOSIS — C50912 Malignant neoplasm of unspecified site of left female breast: Secondary | ICD-10-CM | POA: Diagnosis not present

## 2018-06-14 ENCOUNTER — Other Ambulatory Visit: Payer: Self-pay | Admitting: Family Medicine

## 2018-06-20 DIAGNOSIS — Z853 Personal history of malignant neoplasm of breast: Secondary | ICD-10-CM | POA: Diagnosis not present

## 2018-06-20 DIAGNOSIS — M810 Age-related osteoporosis without current pathological fracture: Secondary | ICD-10-CM | POA: Diagnosis not present

## 2018-06-22 ENCOUNTER — Telehealth: Payer: Self-pay

## 2018-06-22 NOTE — Telephone Encounter (Signed)
TA-BMD results received from [facility] showing a new Dx of Osteoporosis/Do you want me to get her scheduled for a 30 minute visit to discuss Tx options? Plz advise/thx dmf  (This is not urgent/This can wait until TA returns)

## 2018-06-22 NOTE — Telephone Encounter (Signed)
Yes please. Thank you

## 2018-06-23 DIAGNOSIS — C50912 Malignant neoplasm of unspecified site of left female breast: Secondary | ICD-10-CM | POA: Diagnosis not present

## 2018-06-23 DIAGNOSIS — Z9011 Acquired absence of right breast and nipple: Secondary | ICD-10-CM | POA: Diagnosis not present

## 2018-06-23 NOTE — Telephone Encounter (Signed)
She has an appointment scheduled for 06/30/18

## 2018-06-30 ENCOUNTER — Other Ambulatory Visit: Payer: Self-pay

## 2018-06-30 ENCOUNTER — Ambulatory Visit: Payer: PPO | Admitting: Family Medicine

## 2018-06-30 MED ORDER — ADVAIR DISKUS 100-50 MCG/DOSE IN AEPB: INHALATION_SPRAY | RESPIRATORY_TRACT | 5 refills | Status: DC

## 2018-07-04 DIAGNOSIS — D0512 Intraductal carcinoma in situ of left breast: Secondary | ICD-10-CM | POA: Diagnosis not present

## 2018-07-26 ENCOUNTER — Ambulatory Visit: Payer: PPO | Admitting: Family Medicine

## 2018-07-27 ENCOUNTER — Ambulatory Visit (INDEPENDENT_AMBULATORY_CARE_PROVIDER_SITE_OTHER): Payer: PPO | Admitting: Family Medicine

## 2018-07-27 ENCOUNTER — Encounter: Payer: Self-pay | Admitting: Family Medicine

## 2018-07-27 VITALS — BP 140/74 | HR 100 | Temp 97.5°F | Ht 65.0 in | Wt 112.0 lb

## 2018-07-27 DIAGNOSIS — M810 Age-related osteoporosis without current pathological fracture: Secondary | ICD-10-CM

## 2018-07-27 NOTE — Patient Instructions (Addendum)
Great to see you.  We are ordering Prolia for you- a twice yearly injection we can give you here. We will call you once we get this approved.  STOP taking fosamax.  Continue taking your calcium and Vitamin D.

## 2018-07-27 NOTE — Assessment & Plan Note (Signed)
>  25 minutes spent in face to face time with patient, >50% spent in counselling or coordination of care Deteriorated in a thin, caucasian, female smoker who has failed fosamax. Advised to STOP taking fosamax. Continue calcium and vitamin D supplement, weight bearing exercise. Order prolia- mag, phos, calcium and cr labs ordered as future orders to be drawn within 14 days of her receiving prolia. The patient indicates understanding of these issues and agrees with the plan.

## 2018-07-27 NOTE — Progress Notes (Signed)
Subjective:   Patient ID: Bianca [name], female    DOB: 1941-02-13, 78 y.o.   MRN: [id number]  Bianca [name] is a pleasant 78 y.o. year old female who presents to clinic today with Osteoporosis (Patient is here today to discuss most recent BMD scan that shows worsening Osteoporosis.)  on 07/27/2018  HPI:  Osteoporosis- has been on fosamax but DEXA this year showed that her bone density has worsened in her femur.  T score is now -4.0 ( was -3.5).  Bone density is the lowest in her right hip.  She has noticed some right hip pain.  She is taking her Fosamax weekly along with Calcium/Vitamin.  No fractures.  She is a smoker.    Current Outpatient Medications on File Prior to Visit  Medication Sig Dispense Refill  . ADVAIR DISKUS 100-50 MCG/DOSE AEPB TAKE 1 PUFF TWICE A DAY AS DIRECTED RINSE MOUTH AFTER USE 60 each 5  . alendronate (FOSAMAX) 70 MG tablet TAKE 1 TABLET EVERY 7 DAYS WITH A FULL GLASS OF WATER ON AN EMPTY STOMACH DO NOT LIE DOWN FOR AT LEAST 30 MIN 4 tablet 3  . busPIRone (BUSPAR) 10 MG tablet Take 1.5 tablets (15 mg total) by mouth 2 (two) times daily. 90 tablet 2  . CALCIUM PO Take by mouth.    . Cholecalciferol (VITAMIN D-3 PO) Take by mouth.    . fexofenadine (ALLEGRA) 180 MG tablet Take 180 mg by mouth as directed.      . lactose free nutrition (BOOST) LIQD Take 237 mLs by mouth 2 (two) times daily between meals.    . mirtazapine (REMERON) 15 MG tablet Take 1 tablet (15 mg total) by mouth at bedtime. 90 tablet 3  . Multiple Vitamin (MULTIVITAMIN) tablet Take 1 tablet by mouth daily.      . Omega-3 Fatty Acids (OMEGA 3 PO) Take by mouth.    Marland Kitchen PROAIR HFA 108 (90 Base) MCG/ACT inhaler INHALE 1-2 PUFFS INTO THE LUNGS EVERY 4 HOURS AS NEEDED 8.5 g 3  . 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 valsartan (DIOVAN) 40 MG tablet TAKE ONE-HALF TABLET EVERY DAY 30 tablet 3   No current facility-administered medications  on file prior to visit.     No Known Allergies  Past Medical History:  Diagnosis Date  . Anxiety   . Arthritis   . Asthma   . COPD (chronic obstructive pulmonary disease) ([place])   . HOH (hard of hearing)   . Hypertension   . Wears dentures    top and partial bottom  . Wears glasses     Past Surgical History:  Procedure Laterality Date  . APPENDECTOMY    . BREAST LUMPECTOMY WITH NEEDLE LOCALIZATION Right 04/21/2013   Procedure: BREAST LUMPECTOMY WITH NEEDLE LOCALIZATION;  Surgeon: Harl Bowie, MD;  Location: [place];  Service: General;  Laterality: Right;  . BREAST SURGERY  1985   cyst rt br  . MASTECTOMY W/ SENTINEL NODE BIOPSY Left 04/21/2013   Procedure: MASTECTOMY WITH SENTINEL LYMPH NODE BIOPSY;  Surgeon: Harl Bowie, MD;  Location: [place];  Service: General;  Laterality: Left;  . OTHER SURGICAL HISTORY     cysts removed from foot  . OTHER SURGICAL HISTORY     cysts removed from breast  . TUBAL LIGATION      No family history on file.  Social History   Socioeconomic  History  . Marital status: Married    Spouse name: Not on file  . Number of children: Not on file  . Years of education: Not on file  . Highest education level: Not on file  Occupational History  . Occupation: retired    Fish farm manager: RETIRED  Social Needs  . Financial resource strain: Not on file  . Food insecurity:    Worry: Not on file    Inability: Not on file  . Transportation needs:    Medical: Not on file    Non-medical: Not on file  Tobacco Use  . Smoking status: Current Some Day Smoker    Packs/day: 0.50    Types: Cigarettes  . Smokeless tobacco: Never Used  Substance and Sexual Activity  . Alcohol use: No  . Drug use: No  . Sexual activity: Not on file    Comment: 1-2 daily  Lifestyle  . Physical activity:    Days per week: Not on file    Minutes per session: Not on file  . Stress: Not on file  Relationships  . Social  connections:    Talks on phone: Not on file    Gets together: Not on file    Attends religious service: Not on file    Active member of club or organization: Not on file    Attends meetings of clubs or organizations: Not on file    Relationship status: Not on file  . Intimate partner violence:    Fear of current or ex partner: Not on file    Emotionally abused: Not on file    Physically abused: Not on file    Forced sexual activity: Not on file  Other Topics Concern  . Not on file  Social History Narrative   Has a living will   The PMH, PSH, Social History, Family History, Medications, and allergies have been reviewed in [facility], and have been updated if relevant.   Review of Systems  Constitutional: Negative.   HENT: Negative.   Respiratory: Negative.   Cardiovascular: Negative.   Endocrine: Negative.   Genitourinary: Negative.   Musculoskeletal: Positive for arthralgias.  Allergic/Immunologic: Negative.   Neurological: Negative.   Hematological: Negative.   Psychiatric/Behavioral: Negative.   All other systems reviewed and are negative.      Objective:    BP 140/74 (BP Location: Right Arm, Patient Position: Sitting, Cuff Size: Normal)   Pulse 100   Temp (!) 97.5 F (36.4 C) (Oral)   Ht 5\' 5"  (1.651 m)   Wt 112 lb (50.8 kg)   SpO2 94%   BMI 18.64 kg/m   Wt Readings from Last 3 Encounters:  07/27/18 112 lb (50.8 kg)  02/07/18 112 lb 12.8 oz (51.2 kg)  12/06/17 108 lb 6.4 oz (49.2 kg)    Physical Exam Vitals signs and nursing note reviewed.  Constitutional:      Appearance: Normal appearance.  HENT:     Head: Normocephalic and atraumatic.     Mouth/Throat:     Mouth: Mucous membranes are moist.  Eyes:     Extraocular Movements: Extraocular movements intact.  Neck:     Musculoskeletal: Normal range of motion.  Cardiovascular:     Rate and Rhythm: Normal rate.     Pulses: Normal pulses.  Pulmonary:     Effort: Pulmonary effort is normal.    Musculoskeletal: Normal range of motion.        General: No swelling.  Skin:    General: Skin is warm and  dry.  Neurological:     General: No focal deficit present.     Mental Status: She is alert.  Psychiatric:        Mood and Affect: Mood normal.        Behavior: Behavior normal.        Thought Content: Thought content normal.        Judgment: Judgment normal.           Assessment & Plan:   Osteoporosis, unspecified osteoporosis type, unspecified pathological fracture presence No follow-ups on file.

## 2018-08-11 ENCOUNTER — Telehealth: Payer: Self-pay | Admitting: Behavioral Health

## 2018-08-11 NOTE — Telephone Encounter (Signed)
Spoke with patient and discussed insurance benefits & cost of Prolia injection. Patient voiced understanding & scheduled nurse visit appointment for 08/23/2018 at 3:00 PM.

## 2018-08-11 NOTE — Telephone Encounter (Signed)
Received Summary of Benefits for Prolia injection. No PA is required. The estimated out of pocket expense is $0.  Attempted to reach patient to discuss the above information; no answer at the time of call. Left message for patient to return call when available.

## 2018-08-16 ENCOUNTER — Other Ambulatory Visit (INDEPENDENT_AMBULATORY_CARE_PROVIDER_SITE_OTHER): Payer: PPO

## 2018-08-16 DIAGNOSIS — M810 Age-related osteoporosis without current pathological fracture: Secondary | ICD-10-CM

## 2018-08-16 LAB — COMPREHENSIVE METABOLIC PANEL
ALT: 15 U/L (ref 0–35)
AST: 20 U/L (ref 0–37)
Albumin: 4.2 g/dL (ref 3.5–5.2)
Alkaline Phosphatase: 55 U/L (ref 39–117)
BILIRUBIN TOTAL: 0.6 mg/dL (ref 0.2–1.2)
BUN: 26 mg/dL — AB (ref 6–23)
CALCIUM: 10.2 mg/dL (ref 8.4–10.5)
CHLORIDE: 105 meq/L (ref 96–112)
CO2: 30 meq/L (ref 19–32)
CREATININE: 1.66 mg/dL — AB (ref 0.40–1.20)
GFR: 29.76 mL/min — ABNORMAL LOW (ref >60.00)
GLUCOSE: 111 mg/dL — AB (ref 70–99)
Potassium: 5.2 mEq/L — ABNORMAL HIGH (ref 3.5–5.1)
SODIUM: 144 meq/L (ref 135–145)
Total Protein: 6.9 g/dL (ref 6.0–8.3)

## 2018-08-16 LAB — MAGNESIUM: Magnesium: 2.3 mg/dL (ref 1.5–2.5)

## 2018-08-16 LAB — PHOSPHORUS: Phosphorus: 4.1 mg/dL (ref 2.3–4.6)

## 2018-08-17 ENCOUNTER — Other Ambulatory Visit: Payer: PPO

## 2018-08-17 ENCOUNTER — Telehealth: Payer: Self-pay

## 2018-08-17 ENCOUNTER — Other Ambulatory Visit: Payer: Self-pay | Admitting: Family Medicine

## 2018-08-17 DIAGNOSIS — N184 Chronic kidney disease, stage 4 (severe): Secondary | ICD-10-CM

## 2018-08-17 NOTE — Telephone Encounter (Signed)
Copied from [place] [id number]. Topic: Referral - Question >> Aug 17, 2018  3:01 PM Vernona Rieger wrote: Reason for CRM: pt would like her referral for her kidneys to go to [facility] because she has been there before

## 2018-08-18 NOTE — Telephone Encounter (Signed)
Monica please see below

## 2018-08-18 NOTE — Telephone Encounter (Signed)
Pt aware/thx dmf 

## 2018-08-18 NOTE — Telephone Encounter (Signed)
Referral has already been processed and sent to [facility].

## 2018-08-18 NOTE — Telephone Encounter (Signed)
TA-Pt would like a referral to [facility] as she has been there before/plz advise/thx dmf

## 2018-08-23 ENCOUNTER — Ambulatory Visit (INDEPENDENT_AMBULATORY_CARE_PROVIDER_SITE_OTHER): Payer: PPO | Admitting: Behavioral Health

## 2018-08-23 DIAGNOSIS — M810 Age-related osteoporosis without current pathological fracture: Secondary | ICD-10-CM | POA: Diagnosis not present

## 2018-08-23 MED ORDER — DENOSUMAB 60 MG/ML ~~LOC~~ SOSY
60.0000 mg | PREFILLED_SYRINGE | Freq: Once | SUBCUTANEOUS | Status: AC
  Administered 2018-08-23: 60 mg via SUBCUTANEOUS

## 2018-08-23 NOTE — Progress Notes (Addendum)
Patient came in office today for first Prolia injection. SQ injection was given in the right arm. Patient tolerated it well. RN instructed patient to wait in the front lobby for 15 minutes after the injection was administered. There were no signs or symptoms of a reaction prior to patient leaving the office.  Agreed.

## 2018-08-24 ENCOUNTER — Telehealth: Payer: Self-pay

## 2018-08-24 DIAGNOSIS — N184 Chronic kidney disease, stage 4 (severe): Secondary | ICD-10-CM

## 2018-08-24 NOTE — Telephone Encounter (Signed)
Copied from [place] [phone]. Topic: General - Other >> Aug 23, 2018  5:03 PM Percell Belt A wrote: Reason for CRM:   [facility]  office called in and stated they are wanting [place] to do another:  Urinalysis Renal panel  And fax the results to [phone] They are thinking they maybe be sche the patient out 2 or 3 weeks

## 2018-08-25 NOTE — Telephone Encounter (Signed)
TA-Plz see below/[facility]/office employee called asking that we run another U/A & RFP in 2-3 weeks and then fax results to them/is this ok? Plz advise and I can place future orders and schedule patient/thx dmf

## 2018-08-25 NOTE — Telephone Encounter (Signed)
Yes I am ok with this .  Please schedule.

## 2018-08-26 NOTE — Telephone Encounter (Signed)
Pt aware/is scheduled for lab visit/future order created/Will fax results to [facility]: [phone]/thx dmf

## 2018-08-26 NOTE — Addendum Note (Signed)
Addended by: Marrion Coy on: 08/26/2018 10:49 AM   Modules accepted: Orders

## 2018-09-06 DIAGNOSIS — Z1231 Encounter for screening mammogram for malignant neoplasm of breast: Secondary | ICD-10-CM | POA: Diagnosis not present

## 2018-09-06 LAB — HM MAMMOGRAPHY

## 2018-09-13 ENCOUNTER — Encounter: Payer: Self-pay | Admitting: Family Medicine

## 2018-09-13 ENCOUNTER — Other Ambulatory Visit (INDEPENDENT_AMBULATORY_CARE_PROVIDER_SITE_OTHER): Payer: PPO

## 2018-09-13 DIAGNOSIS — N184 Chronic kidney disease, stage 4 (severe): Secondary | ICD-10-CM

## 2018-09-13 LAB — URINALYSIS, ROUTINE W REFLEX MICROSCOPIC
BILIRUBIN URINE: NEGATIVE
Hgb urine dipstick: NEGATIVE
Ketones, ur: NEGATIVE
LEUKOCYTE UA: NEGATIVE
Nitrite: NEGATIVE
PH: 7 (ref 5.0–8.0)
RBC / HPF: NONE SEEN (ref 0–?)
Specific Gravity, Urine: 1.01 (ref 1.000–1.030)
TOTAL PROTEIN, URINE-UPE24: NEGATIVE
Urine Glucose: NEGATIVE
Urobilinogen, UA: 0.2 (ref 0.0–1.0)
WBC, UA: NONE SEEN (ref 0–?)

## 2018-09-13 LAB — COMPREHENSIVE METABOLIC PANEL
ALT: 12 U/L (ref 0–35)
AST: 19 U/L (ref 0–37)
Albumin: 4.3 g/dL (ref 3.5–5.2)
Alkaline Phosphatase: 54 U/L (ref 39–117)
BILIRUBIN TOTAL: 0.5 mg/dL (ref 0.2–1.2)
BUN: 18 mg/dL (ref 6–23)
CO2: 25 mEq/L (ref 19–32)
Calcium: 9.2 mg/dL (ref 8.4–10.5)
Chloride: 102 mEq/L (ref 96–112)
Creatinine, Ser: 1.27 mg/dL — ABNORMAL HIGH (ref 0.40–1.20)
GFR: 40.53 mL/min — AB (ref >60.00)
Glucose, Bld: 93 mg/dL (ref 70–99)
Potassium: 4.1 mEq/L (ref 3.5–5.1)
Sodium: 138 mEq/L (ref 135–145)
Total Protein: 7 g/dL (ref 6.0–8.3)

## 2018-09-13 NOTE — Progress Notes (Signed)
[facility]/Mammogram/thx dmf

## 2018-09-15 DIAGNOSIS — I129 Hypertensive chronic kidney disease with stage 1 through stage 4 chronic kidney disease, or unspecified chronic kidney disease: Secondary | ICD-10-CM | POA: Diagnosis not present

## 2018-09-15 DIAGNOSIS — N261 Atrophy of kidney (terminal): Secondary | ICD-10-CM | POA: Diagnosis not present

## 2018-09-15 DIAGNOSIS — I951 Orthostatic hypotension: Secondary | ICD-10-CM | POA: Diagnosis not present

## 2018-09-15 DIAGNOSIS — N183 Chronic kidney disease, stage 3 (moderate): Secondary | ICD-10-CM | POA: Diagnosis not present

## 2018-09-20 ENCOUNTER — Encounter: Payer: Self-pay | Admitting: Family Medicine

## 2018-12-12 ENCOUNTER — Other Ambulatory Visit: Payer: Self-pay | Admitting: Family Medicine

## 2018-12-14 ENCOUNTER — Telehealth: Payer: Self-pay | Admitting: Family Medicine

## 2018-12-14 NOTE — Telephone Encounter (Signed)
Pt daughter called with pt. Patient is on the phone having concerns about her medication Buspirone 10mg . Patient states that last night she could have had a panic attack last night and she is thinking that maybe needing a increase on this medication. Pt is schedule for an virtual visit tomorrow at 9:20am.

## 2018-12-14 NOTE — Telephone Encounter (Signed)
FYI. Patient has an appointment with you in the morning.

## 2018-12-14 NOTE — Progress Notes (Signed)
Virtual Visit via Video   Due to the COVID-19 pandemic, this visit was completed with telemedicine (audio/video) technology to reduce patient and provider exposure as well as to preserve personal protective equipment.   I connected with Bianca [name] by a video enabled telemedicine application and verified that I am speaking with the correct person using two identifiers. Location patient: Home Location provider: [facility], Office Persons participating in the virtual visit: Juliann Pulse, MD Candace (patient's daughter).    I discussed the limitations of evaluation and management by telemedicine and the availability of in person appointments. The patient expressed understanding and agreed to proceed.  Care Team   Patient Care Team: Lucille Passy, MD as PCP - General Magrinat, Virgie Dad, MD as Consulting Physician (Oncology) Coralie Keens, MD as Consulting Physician (General Surgery)  Subjective:   HPI:   Received following call to our triage nurse yesterday:  Pt daughter called with pt. Patient is on the phone having concerns about her medication Buspirone 10mg . Patient states that last night she could have had a panic attack last night and she is thinking that maybe needing a increase on this medication  HTN-. On Thursday night last week had a panic attack and Systolic went 619. Tues hs had another panic attack and Systolic went to 509. Candace called EMS and it went down to 180/110 before he left. Feels like the pandemic has become her stressor. Denies any CP, SOB, arm/neck/jaw pain. She just restarted Buspar 15 mg twice daily- just started this on Monday.   Understand need to take bid to be effective and that this can be adjusted. Takes 1/2 BP pill in the afternoon and sometimes thinks that she Pt is C/O elevated BP. On Thursday night last week had a panic attack and Systolic went 326. Tues hs had another panic attack and Systolic went to 712. Candace called  EMS and it went down to 180/110 before he left. Feels like the pandemic has become her stressor. Denies any CP, SOB, arm/neck/jaw pain. She just started the BuSpar 15 mg twice daily on Monday Takes 1/2 BP pill in the afternoon and sometimes thinks that she should take 1/2 a diovan twice daily instead of half a diovan daily.   Review of Systems  Constitutional: Negative.   HENT: Negative.   Eyes: Negative.   Respiratory: Negative.   Cardiovascular: Negative.   Gastrointestinal: Negative.   Genitourinary: Negative.   Musculoskeletal: Negative.   Skin: Negative.   Neurological: Negative.   Endo/Heme/Allergies: Negative.   Psychiatric/Behavioral: Positive for depression. Negative for hallucinations, memory loss, substance abuse and suicidal ideas. The patient is nervous/anxious. The patient does not have insomnia.   All other systems reviewed and are negative.    Patient Active Problem List   Diagnosis Date Noted  . Underweight due to inadequate caloric intake 12/06/2017  . Acute bronchitis with COPD ([place]) 03/29/2017  . Osteoporosis 07/02/2016  . Tobacco abuse 10/26/2013  . Breast cancer of upper-outer quadrant of left female breast ([place]) 03/06/2013  . COPD (chronic obstructive pulmonary disease) ([place]) 06/09/2012  . Adjustment disorder 11/25/2010  . CHRONIC KIDNEY DISEASE STAGE III (MODERATE) 11/13/2009  . Depression 05/04/2007  . ASTHMA 05/03/2007  . HYPERTENSION, BENIGN ESSENTIAL 12/06/2006  . ALLERGIC  RHINITIS 12/06/2006    Social History   Tobacco Use  . Smoking status: Current Some Day Smoker    Packs/day: 0.50    Types: Cigarettes  . Smokeless tobacco: Never Used  Substance  Use Topics  . Alcohol use: No    Current Outpatient Medications:  .  ADVAIR DISKUS 100-50 MCG/DOSE AEPB, TAKE 1 PUFF TWICE A DAY AS DIRECTED RINSE MOUTH AFTER USE, Disp: 60 each, Rfl: 5 .  busPIRone (BUSPAR) 10 MG tablet, TAKE 1.5 TABLETS BY MOUTH TWICE DAILY, Disp: 90 tablet, Rfl: 1 .  CALCIUM  PO, Take by mouth., Disp: , Rfl:  .  Cholecalciferol (VITAMIN D-3 PO), Take by mouth., Disp: , Rfl:  .  fexofenadine (ALLEGRA) 180 MG tablet, Take 180 mg by mouth as directed.  , Disp: , Rfl:  .  lactose free nutrition (BOOST) LIQD, Take 237 mLs by mouth 2 (two) times daily between meals., Disp: , Rfl:  .  mirtazapine (REMERON) 15 MG tablet, Take 1 tablet (15 mg total) by mouth at bedtime., Disp: 90 tablet, Rfl: 3 .  Multiple Vitamin (MULTIVITAMIN) tablet, Take 1 tablet by mouth daily.  , Disp: , Rfl:  .  Omega-3 Fatty Acids (OMEGA 3 PO), Take by mouth., Disp: , Rfl:  .  PROAIR HFA 108 (90 Base) MCG/ACT inhaler, INHALE 1-2 PUFFS INTO THE LUNGS EVERY 4 HOURS AS NEEDED, Disp: 8.5 g, Rfl: 3 .  UNABLE TO FIND, Rx: [id number]- Post Surgical Bras (Quantity: 6)\ Dx: 174.9; Left mastectomy, Disp: 1 each, Rfl: 0 .  valsartan (DIOVAN) 40 MG tablet, TAKE ONE-HALF TABLET EVERY DAY, Disp: 30 tablet, Rfl: 3  No Known Allergies  Objective:  BP 112/68 \ VITALS: Per patient if applicable, see vitals. GENERAL: Alert, appears well and in no acute distress. HEENT: Atraumatic, conjunctiva clear, no obvious abnormalities on inspection of external nose and ears. NECK: Normal movements of the head and neck. CARDIOPULMONARY: No increased WOB. Speaking in clear sentences. I:E ratio WNL.  MS: Moves all visible extremities without noticeable abnormality. PSYCH: Pleasant and cooperative, well-groomed. Speech normal rate and rhythm. Affect is appropriate. Insight and judgement are appropriate. Attention is focused, linear, and appropriate.  NEURO: CN grossly intact. Oriented as arrived to appointment on time with no prompting. Moves both UE equally.  SKIN: No obvious lesions, wounds, erythema, or cyanosis noted on face or hands.  Depression screen [facility] 2/9 07/27/2018 04/19/2017 06/17/2015  Decreased Interest 0 0 0  Down, Depressed, Hopeless 0 1 0  PHQ - 2 Score 0 1 0  Altered sleeping - 1 -  Tired, decreased energy - 0 -   Change in appetite - 1 -  Feeling bad or failure about yourself  - 0 -  Trouble concentrating - 0 -  Moving slowly or fidgety/restless - 0 -  Suicidal thoughts - 0 -  PHQ-9 Score - 3 -  Difficult doing work/chores - Not difficult at all -    Assessment and Plan:   There are no diagnoses linked to this encounter.  Marland Kitchen COVID-19 Education: The signs and symptoms of COVID-19 were discussed with the patient and how to seek care for testing if needed. The importance of social distancing was discussed today. . Reviewed expectations re: course of current medical issues. . Discussed self-management of symptoms. . Outlined signs and symptoms indicating need for more acute intervention. . Patient verbalized understanding and all questions were answered. Marland Kitchen Health Maintenance issues including appropriate healthy diet, exercise, and smoking avoidance were discussed with patient. . See orders for this visit as documented in the electronic medical record.  Arnette Norris, MD  Records requested if needed. Time spent: 40 minutes, of which >50% was spent in obtaining information about her symptoms, reviewing  her previous labs, evaluations, and treatments, counseling her about her condition (please see the discussed topics above), and developing a plan to further investigate it; she had a number of questions which I addressed.

## 2018-12-15 ENCOUNTER — Ambulatory Visit (INDEPENDENT_AMBULATORY_CARE_PROVIDER_SITE_OTHER): Payer: PPO | Admitting: Family Medicine

## 2018-12-15 ENCOUNTER — Encounter: Payer: Self-pay | Admitting: Family Medicine

## 2018-12-15 VITALS — BP 112/68

## 2018-12-15 DIAGNOSIS — I10 Essential (primary) hypertension: Secondary | ICD-10-CM | POA: Diagnosis not present

## 2018-12-15 DIAGNOSIS — F4323 Adjustment disorder with mixed anxiety and depressed mood: Secondary | ICD-10-CM | POA: Diagnosis not present

## 2018-12-15 MED ORDER — MIRTAZAPINE 15 MG PO TABS: 15.0000 mg | ORAL_TABLET | Freq: Every day | ORAL | 3 refills | Status: DC

## 2018-12-15 MED ORDER — BUSPIRONE HCL 15 MG PO TABS: 15.0000 mg | ORAL_TABLET | Freq: Two times a day (BID) | ORAL | 3 refills | Status: DC

## 2018-12-15 MED ORDER — LOSARTAN POTASSIUM 25 MG PO TABS: 12.5000 mg | ORAL_TABLET | Freq: Two times a day (BID) | ORAL | 3 refills | Status: DC

## 2018-12-15 NOTE — Assessment & Plan Note (Signed)
>  40  minutes spent in face to face time with patient, >50% spent in counselling or coordination of care discussing depression, insomnia, and HTN. She just started buspar 15 mg twice daily two days ago, so I would like to give this a little more time.  Continue mirtazapine at bedtime for sleep and appetite.   Also, diovan does not come in a lower dose- given her BP today, I do not feel comfortable increasing her dose.  After a long discussion, we agreed to try cozaar 12.5 mg twice daily and to d/c diovan.  She will come see me in person in two weeks, call me sooner if she has any concerns. The patient indicates understanding of these issues and agrees with the plan.

## 2018-12-25 NOTE — Progress Notes (Signed)
Subjective:   Patient ID: Bianca [name], female    DOB: 1940/10/04, 78 y.o.   MRN: [id number]  Bianca [name] is a pleasant 78 y.o. year old female who presents to clinic today with Follow-up (Patient is here today to F/U with Anxiety from 5.28.20 visit. GAD was 17 and PHQ was 12. She was advised to continue BuSpar 15mg  bid and Mirtazapine. Trial of Cozaar 12.5mg  bid and D/C Diovan. She states that she is doing much better and sleeps like a baby.)  on 12/26/2018  HPI: Here for two week follow up.  Last had a televisit with me on 12/15/18.  Note reviewed.  That visit was also attended by her daughter, Bianca [name] who is with [place] again here today.  HPI was as follows:  Pt's daughter had called the day prior to televisit stating that patient, Bianca [name], thought she had a panic attack and maybe needed to increase her dose of Buspar which was 10 mg twice daily but had increased dose to 15 mg twice daily just a few days prior but admitted to only taking it once a day.  She was also concerned about her blood pressure stating: "On Thursday night last week had a panic attack and Systolic went 229. Tues hs had another panic attack and Systolic went to 798. Bianca [name] called EMS and it went down to 180/110 before he left. Feels like the pandemic has become her stressor. Denies any CP, SOB, arm/neck/jaw pain."  We discussed that she she try taking Buspar 15 mg twice daily to improve its effectiveness.    Also she had been taking 1/2 of a diovan in the afternoon.  Lab Results  Component Value Date   CREATININE 1.27 (H) 09/13/2018   Lab Results  Component Value Date   NA 138 09/13/2018   K 4.1 09/13/2018   CL 102 09/13/2018   CO2 25 09/13/2018     GAD 7 : Generalized Anxiety Score 12/26/2018 12/15/2018  Nervous, Anxious, on Edge 0 3  Control/stop worrying 1 3  Worry too much - different things 0 3  Trouble relaxing 0 3  Restless 0 1  Easily annoyed or irritable 0 1  Afraid - awful might happen 0 3   Total GAD 7 Score 1 17  Anxiety Difficulty Not difficult at all Extremely difficult    Depression screen [facility] 2/9 12/26/2018 12/15/2018 07/27/2018 04/19/2017 06/17/2015  Decreased Interest 0 3 0 0 0  Down, Depressed, Hopeless 0 3 0 1 0  PHQ - 2 Score 0 6 0 1 0  Altered sleeping 0 3 - 1 -  Tired, decreased energy 0 1 - 0 -  Change in appetite 0 0 - 1 -  Feeling bad or failure about yourself  0 0 - 0 -  Trouble concentrating 0 2 - 0 -  Moving slowly or fidgety/restless 0 0 - 0 -  Suicidal thoughts 0 0 - 0 -  PHQ-9 Score 0 12 - 3 -  Difficult doing work/chores Not difficult at all Very difficult - Not difficult at all -   I advised to continue Buspar 15 mg twice daily as she had just increased the dose 2 days ago and to continue mirtazapine at bedtime for sleep and appetite.  She feels markedly better today.  Since diovan does not come in a lower dose and she was normotensive on the day of the televisit, I did not feel comfortable increasing her dose.   After a long discussion, we  agreed to try cozaar 12.5 mg twice daily and to d/c diovan and to follow up with me today, sooner is she has any new symptoms, concerns or BP fluctuations.  She is very pleased with how her blood pressure has been running on this.  Lab Results  Component Value Date   CREATININE 1.27 (H) 09/13/2018   Lab Results  Component Value Date   NA 138 09/13/2018   K 4.1 09/13/2018   CL 102 09/13/2018   CO2 25 09/13/2018   Current Outpatient Medications on File Prior to Visit  Medication Sig Dispense Refill  . ADVAIR DISKUS 100-50 MCG/DOSE AEPB TAKE 1 PUFF TWICE A DAY AS DIRECTED RINSE MOUTH AFTER USE 60 each 5  . busPIRone (BUSPAR) 15 MG tablet Take 1 tablet (15 mg total) by mouth 2 (two) times daily. 30 tablet 3  . CALCIUM PO Take by mouth.    . Cholecalciferol (VITAMIN D-3 PO) Take by mouth.    . fexofenadine (ALLEGRA) 180 MG tablet Take 180 mg by mouth as directed.      . lactose free nutrition (BOOST) LIQD Take 237  mLs by mouth 2 (two) times daily between meals.    . mirtazapine (REMERON) 15 MG tablet Take 1 tablet (15 mg total) by mouth at bedtime. 90 tablet 3  . Multiple Vitamin (MULTIVITAMIN) tablet Take 1 tablet by mouth daily.      . Omega-3 Fatty Acids (OMEGA 3 PO) Take by mouth.    Marland Kitchen PROAIR HFA 108 (90 Base) MCG/ACT inhaler INHALE 1-2 PUFFS INTO THE LUNGS EVERY 4 HOURS AS NEEDED 8.5 g 3  . UNABLE TO FIND Rx: [id number]- Post Surgical Bras (Quantity: 6)\ Dx: 174.9; Left mastectomy 1 each 0   No current facility-administered medications on file prior to visit.     No Known Allergies  Past Medical History:  Diagnosis Date  . Anxiety   . Arthritis   . Asthma   . COPD (chronic obstructive pulmonary disease) ([place])   . HOH (hard of hearing)   . Hypertension   . Wears dentures    top and partial bottom  . Wears glasses     Past Surgical History:  Procedure Laterality Date  . APPENDECTOMY    . BREAST LUMPECTOMY WITH NEEDLE LOCALIZATION Right 04/21/2013   Procedure: BREAST LUMPECTOMY WITH NEEDLE LOCALIZATION;  Surgeon: Harl Bowie, MD;  Location: [facility];  Service: General;  Laterality: Right;  . BREAST SURGERY  1985   cyst rt br  . MASTECTOMY W/ SENTINEL NODE BIOPSY Left 04/21/2013   Procedure: MASTECTOMY WITH SENTINEL LYMPH NODE BIOPSY;  Surgeon: Harl Bowie, MD;  Location: [facility];  Service: General;  Laterality: Left;  . OTHER SURGICAL HISTORY     cysts removed from foot  . OTHER SURGICAL HISTORY     cysts removed from breast  . TUBAL LIGATION      No family history on file.  Social History   Socioeconomic History  . Marital status: Married    Spouse name: Not on file  . Number of children: Not on file  . Years of education: Not on file  . Highest education level: Not on file  Occupational History  . Occupation: retired    Fish farm manager: RETIRED  Social Needs  . Financial resource strain: Not on file  . Food insecurity:    Worry:  Not on file    Inability: Not on file  . Transportation needs:    Medical: Not  on file    Non-medical: Not on file  Tobacco Use  . Smoking status: Current Some Day Smoker    Packs/day: 0.50    Types: Cigarettes  . Smokeless tobacco: Never Used  Substance and Sexual Activity  . Alcohol use: No  . Drug use: No  . Sexual activity: Not on file    Comment: 1-2 daily  Lifestyle  . Physical activity:    Days per week: Not on file    Minutes per session: Not on file  . Stress: Not on file  Relationships  . Social connections:    Talks on phone: Not on file    Gets together: Not on file    Attends religious service: Not on file    Active member of club or organization: Not on file    Attends meetings of clubs or organizations: Not on file    Relationship status: Not on file  . Intimate partner violence:    Fear of current or ex partner: Not on file    Emotionally abused: Not on file    Physically abused: Not on file    Forced sexual activity: Not on file  Other Topics Concern  . Not on file  Social History Narrative   Has a living will   The PMH, PSH, Social History, Family History, Medications, and allergies have been reviewed in [facility], and have been updated if relevant.  Review of Systems  Constitutional: Negative.   HENT: Negative.   Eyes: Negative.   Respiratory: Negative.   Cardiovascular: Negative.   Gastrointestinal: Negative.   Endocrine: Negative.   Genitourinary: Negative.   Musculoskeletal: Negative.   Allergic/Immunologic: Negative.   Neurological: Negative.   Hematological: Negative.   Psychiatric/Behavioral: Negative.   All other systems reviewed and are negative.      Objective:    BP 130/72 (BP Location: Right Arm, Patient Position: Sitting, Cuff Size: Normal)   Pulse 84   Temp (!) 97.3 F (36.3 C) (Oral)   Ht 5\' 5"  (1.651 m)   Wt 110 lb 6.4 oz (50.1 kg)   SpO2 97%   BMI 18.37 kg/m   Wt Readings from Last 3 Encounters:  12/26/18 110 lb 6.4  oz (50.1 kg)  07/27/18 112 lb (50.8 kg)  02/07/18 112 lb 12.8 oz (51.2 kg)     Physical Exam Vitals signs and nursing note reviewed.  Constitutional:      Appearance: Normal appearance.  HENT:     Head: Normocephalic.     Right Ear: External ear normal.     Left Ear: External ear normal.     Nose: Nose normal.     Mouth/Throat:     Mouth: Mucous membranes are moist.  Eyes:     Extraocular Movements: Extraocular movements intact.  Neck:     Musculoskeletal: Normal range of motion.  Cardiovascular:     Rate and Rhythm: Normal rate and regular rhythm.     Pulses: Normal pulses.     Heart sounds: Normal heart sounds.  Pulmonary:     Effort: Pulmonary effort is normal.     Breath sounds: Normal breath sounds.  Musculoskeletal: Normal range of motion.        General: No swelling or deformity.  Skin:    General: Skin is warm and dry.  Neurological:     General: No focal deficit present.     Mental Status: She is alert.  Psychiatric:        Mood and Affect: Mood normal.  Behavior: Behavior normal.           Assessment & Plan:   HYPERTENSION, BENIGN ESSENTIAL - Plan: Comprehensive metabolic panel  Adjustment disorder with anxious mood No follow-ups on file.

## 2018-12-26 ENCOUNTER — Ambulatory Visit (INDEPENDENT_AMBULATORY_CARE_PROVIDER_SITE_OTHER): Payer: PPO | Admitting: Family Medicine

## 2018-12-26 ENCOUNTER — Encounter: Payer: Self-pay | Admitting: Family Medicine

## 2018-12-26 VITALS — BP 130/72 | HR 84 | Temp 97.3°F | Ht 65.0 in | Wt 110.4 lb

## 2018-12-26 DIAGNOSIS — I10 Essential (primary) hypertension: Secondary | ICD-10-CM | POA: Diagnosis not present

## 2018-12-26 DIAGNOSIS — F4322 Adjustment disorder with anxiety: Secondary | ICD-10-CM

## 2018-12-26 LAB — COMPREHENSIVE METABOLIC PANEL
ALT: 16 U/L (ref 0–35)
AST: 20 U/L (ref 0–37)
Albumin: 4.1 g/dL (ref 3.5–5.2)
Alkaline Phosphatase: 56 U/L (ref 39–117)
BUN: 22 mg/dL (ref 6–23)
CO2: 26 mEq/L (ref 19–32)
Calcium: 9.1 mg/dL (ref 8.4–10.5)
Chloride: 108 mEq/L (ref 96–112)
Creatinine, Ser: 1.51 mg/dL — ABNORMAL HIGH (ref 0.40–1.20)
GFR: 33.17 mL/min — ABNORMAL LOW (ref >60.00)
Glucose, Bld: 86 mg/dL (ref 70–99)
Potassium: 4.6 mEq/L (ref 3.5–5.1)
Sodium: 141 mEq/L (ref 135–145)
Total Bilirubin: 0.4 mg/dL (ref 0.2–1.2)
Total Protein: 6.6 g/dL (ref 6.0–8.3)

## 2018-12-26 MED ORDER — LOSARTAN POTASSIUM 25 MG PO TABS: 12.5000 mg | ORAL_TABLET | Freq: Two times a day (BID) | ORAL | 3 refills | Status: DC

## 2018-12-26 NOTE — Assessment & Plan Note (Signed)
Anxiety and depression screens markedly better.  She and her daughter [name] feel the higher dose of buspar along with remeron are helping tremendous with anxiety, sleep and appetite.  Continue current dose. >25 minutes spent in face to face time with patient, >50% spent in counselling or coordination of care discussing anxiety,depression and HTN.  GAD 7 : Generalized Anxiety Score 12/26/2018 12/15/2018  Nervous, Anxious, on Edge 0 3  Control/stop worrying 1 3  Worry too much - different things 0 3  Trouble relaxing 0 3  Restless 0 1  Easily annoyed or irritable 0 1  Afraid - awful might happen 0 3  Total GAD 7 Score 1 17  Anxiety Difficulty Not difficult at all Extremely difficult      Office Visit from 12/26/2018 in [facility]  PHQ-9 Total Score  0

## 2018-12-26 NOTE — Patient Instructions (Signed)
Great to see you. I will call you with your lab results from today and you can view them online.   

## 2018-12-26 NOTE — Assessment & Plan Note (Signed)
BP well controlled on losartan 12.5 mg twice daily. She denies CP, SOB, HA or blurred vision. Will check a CMET today. The patient indicates understanding of these issues and agrees with the plan.

## 2019-01-21 ENCOUNTER — Emergency Department (HOSPITAL_COMMUNITY): Payer: PPO

## 2019-01-21 ENCOUNTER — Other Ambulatory Visit: Payer: Self-pay

## 2019-01-21 ENCOUNTER — Encounter (HOSPITAL_COMMUNITY): Payer: Self-pay | Admitting: *Deleted

## 2019-01-21 ENCOUNTER — Inpatient Hospital Stay (HOSPITAL_COMMUNITY)
Admission: EM | Admit: 2019-01-21 | Discharge: 2019-01-25 | DRG: 308 | Disposition: A | Discharge status: Home Health | Payer: PPO | Attending: Internal Medicine | Admitting: Internal Medicine

## 2019-01-21 DIAGNOSIS — Z9012 Acquired absence of left breast and nipple: Secondary | ICD-10-CM | POA: Diagnosis not present

## 2019-01-21 DIAGNOSIS — I4891 Unspecified atrial fibrillation: Secondary | ICD-10-CM | POA: Diagnosis present

## 2019-01-21 DIAGNOSIS — E874 Mixed disorder of acid-base balance: Secondary | ICD-10-CM | POA: Diagnosis not present

## 2019-01-21 DIAGNOSIS — I480 Paroxysmal atrial fibrillation: Principal | ICD-10-CM | POA: Diagnosis present

## 2019-01-21 DIAGNOSIS — J939 Pneumothorax, unspecified: Secondary | ICD-10-CM | POA: Diagnosis not present

## 2019-01-21 DIAGNOSIS — I080 Rheumatic disorders of both mitral and aortic valves: Secondary | ICD-10-CM | POA: Diagnosis not present

## 2019-01-21 DIAGNOSIS — R4182 Altered mental status, unspecified: Secondary | ICD-10-CM | POA: Diagnosis not present

## 2019-01-21 DIAGNOSIS — I5032 Chronic diastolic (congestive) heart failure: Secondary | ICD-10-CM | POA: Diagnosis not present

## 2019-01-21 DIAGNOSIS — Z79899 Other long term (current) drug therapy: Secondary | ICD-10-CM

## 2019-01-21 DIAGNOSIS — M810 Age-related osteoporosis without current pathological fracture: Secondary | ICD-10-CM | POA: Diagnosis not present

## 2019-01-21 DIAGNOSIS — F17210 Nicotine dependence, cigarettes, uncomplicated: Secondary | ICD-10-CM | POA: Diagnosis present

## 2019-01-21 DIAGNOSIS — Z853 Personal history of malignant neoplasm of breast: Secondary | ICD-10-CM | POA: Diagnosis not present

## 2019-01-21 DIAGNOSIS — I499 Cardiac arrhythmia, unspecified: Secondary | ICD-10-CM | POA: Diagnosis not present

## 2019-01-21 DIAGNOSIS — I361 Nonrheumatic tricuspid (valve) insufficiency: Secondary | ICD-10-CM | POA: Diagnosis not present

## 2019-01-21 DIAGNOSIS — J45901 Unspecified asthma with (acute) exacerbation: Secondary | ICD-10-CM | POA: Diagnosis present

## 2019-01-21 DIAGNOSIS — E440 Moderate protein-calorie malnutrition: Secondary | ICD-10-CM | POA: Diagnosis present

## 2019-01-21 DIAGNOSIS — G9341 Metabolic encephalopathy: Secondary | ICD-10-CM | POA: Diagnosis not present

## 2019-01-21 DIAGNOSIS — I340 Nonrheumatic mitral (valve) insufficiency: Secondary | ICD-10-CM | POA: Diagnosis not present

## 2019-01-21 DIAGNOSIS — J189 Pneumonia, unspecified organism: Secondary | ICD-10-CM | POA: Diagnosis not present

## 2019-01-21 DIAGNOSIS — I10 Essential (primary) hypertension: Secondary | ICD-10-CM | POA: Diagnosis not present

## 2019-01-21 DIAGNOSIS — R7989 Other specified abnormal findings of blood chemistry: Secondary | ICD-10-CM | POA: Diagnosis not present

## 2019-01-21 DIAGNOSIS — I472 Ventricular tachycardia: Secondary | ICD-10-CM | POA: Diagnosis not present

## 2019-01-21 DIAGNOSIS — N179 Acute kidney failure, unspecified: Secondary | ICD-10-CM | POA: Diagnosis not present

## 2019-01-21 DIAGNOSIS — Z681 Body mass index (BMI) 19 or less, adult: Secondary | ICD-10-CM

## 2019-01-21 DIAGNOSIS — N183 Chronic kidney disease, stage 3 unspecified: Secondary | ICD-10-CM | POA: Diagnosis present

## 2019-01-21 DIAGNOSIS — F419 Anxiety disorder, unspecified: Secondary | ICD-10-CM | POA: Diagnosis not present

## 2019-01-21 DIAGNOSIS — F329 Major depressive disorder, single episode, unspecified: Secondary | ICD-10-CM | POA: Diagnosis not present

## 2019-01-21 DIAGNOSIS — F32A Depression, unspecified: Secondary | ICD-10-CM | POA: Diagnosis present

## 2019-01-21 DIAGNOSIS — I130 Hypertensive heart and chronic kidney disease with heart failure and stage 1 through stage 4 chronic kidney disease, or unspecified chronic kidney disease: Secondary | ICD-10-CM | POA: Diagnosis present

## 2019-01-21 DIAGNOSIS — I959 Hypotension, unspecified: Secondary | ICD-10-CM | POA: Diagnosis not present

## 2019-01-21 DIAGNOSIS — Z716 Tobacco abuse counseling: Secondary | ICD-10-CM

## 2019-01-21 DIAGNOSIS — H905 Unspecified sensorineural hearing loss: Secondary | ICD-10-CM | POA: Diagnosis present

## 2019-01-21 DIAGNOSIS — Z03818 Encounter for observation for suspected exposure to other biological agents ruled out: Secondary | ICD-10-CM | POA: Diagnosis not present

## 2019-01-21 DIAGNOSIS — J449 Chronic obstructive pulmonary disease, unspecified: Secondary | ICD-10-CM | POA: Diagnosis present

## 2019-01-21 DIAGNOSIS — J9811 Atelectasis: Secondary | ICD-10-CM | POA: Diagnosis not present

## 2019-01-21 DIAGNOSIS — Z20828 Contact with and (suspected) exposure to other viral communicable diseases: Secondary | ICD-10-CM | POA: Diagnosis not present

## 2019-01-21 DIAGNOSIS — R410 Disorientation, unspecified: Secondary | ICD-10-CM | POA: Diagnosis not present

## 2019-01-21 DIAGNOSIS — Z720 Tobacco use: Secondary | ICD-10-CM | POA: Diagnosis present

## 2019-01-21 DIAGNOSIS — J440 Chronic obstructive pulmonary disease with acute lower respiratory infection: Secondary | ICD-10-CM | POA: Diagnosis not present

## 2019-01-21 DIAGNOSIS — R000 Tachycardia, unspecified: Secondary | ICD-10-CM | POA: Diagnosis not present

## 2019-01-21 DIAGNOSIS — R778 Other specified abnormalities of plasma proteins: Secondary | ICD-10-CM

## 2019-01-21 DIAGNOSIS — R531 Weakness: Secondary | ICD-10-CM | POA: Diagnosis not present

## 2019-01-21 LAB — CBC WITH DIFFERENTIAL/PLATELET
Abs Immature Granulocytes: 0.04 10*3/uL (ref 0.00–0.07)
Basophils Absolute: 0 10*3/uL (ref 0.0–0.1)
Basophils Relative: 0 %
Eosinophils Absolute: 0.1 10*3/uL (ref 0.0–0.5)
Eosinophils Relative: 1 %
HCT: 35.4 % — ABNORMAL LOW (ref 36.0–46.0)
Hemoglobin: 11.3 g/dL — ABNORMAL LOW (ref 12.0–15.0)
Immature Granulocytes: 0 %
Lymphocytes Relative: 16 %
Lymphs Abs: 1.6 10*3/uL (ref 0.7–4.0)
MCH: 29.5 pg (ref 26.0–34.0)
MCHC: 31.9 g/dL (ref 30.0–36.0)
MCV: 92.4 fL (ref 80.0–100.0)
Monocytes Absolute: 0.7 10*3/uL (ref 0.1–1.0)
Monocytes Relative: 7 %
Neutro Abs: 7.5 10*3/uL (ref 1.7–7.7)
Neutrophils Relative %: 76 %
Platelets: 284 10*3/uL (ref 150–400)
RBC: 3.83 MIL/uL — ABNORMAL LOW (ref 3.87–5.11)
RDW: 13.8 % (ref 11.5–15.5)
WBC: 10 10*3/uL (ref 4.0–10.5)
nRBC: 0 % (ref 0.0–0.2)

## 2019-01-21 LAB — BASIC METABOLIC PANEL
Anion gap: 8 (ref 5–15)
BUN: 28 mg/dL — ABNORMAL HIGH (ref 8–23)
CO2: 20 mmol/L — ABNORMAL LOW (ref 22–32)
Calcium: 8.5 mg/dL — ABNORMAL LOW (ref 8.9–10.3)
Chloride: 112 mmol/L — ABNORMAL HIGH (ref 98–111)
Creatinine, Ser: 2.03 mg/dL — ABNORMAL HIGH (ref 0.44–1.00)
GFR calc Af Amer: 26 mL/min — ABNORMAL LOW (ref >60)
GFR calc non Af Amer: 23 mL/min — ABNORMAL LOW (ref >60)
Glucose, Bld: 141 mg/dL — ABNORMAL HIGH (ref 70–99)
Potassium: 4.6 mmol/L (ref 3.5–5.1)
Sodium: 140 mmol/L (ref 135–145)

## 2019-01-21 LAB — T4, FREE: Free T4: 1.19 ng/dL — ABNORMAL HIGH (ref 0.61–1.12)

## 2019-01-21 LAB — URINALYSIS, ROUTINE W REFLEX MICROSCOPIC
Bilirubin Urine: NEGATIVE
Glucose, UA: NEGATIVE mg/dL
Hgb urine dipstick: NEGATIVE
Ketones, ur: NEGATIVE mg/dL
Leukocytes,Ua: NEGATIVE
Nitrite: NEGATIVE
Protein, ur: NEGATIVE mg/dL
Specific Gravity, Urine: 1.012 (ref 1.005–1.030)
pH: 7 (ref 5.0–8.0)

## 2019-01-21 LAB — HEPATIC FUNCTION PANEL
ALT: 16 U/L (ref 0–44)
AST: 24 U/L (ref 15–41)
Albumin: 3.2 g/dL — ABNORMAL LOW (ref 3.5–5.0)
Alkaline Phosphatase: 53 U/L (ref 38–126)
Bilirubin, Direct: <0.1 mg/dL (ref 0.0–0.2)
Total Bilirubin: 0.7 mg/dL (ref 0.3–1.2)
Total Protein: 5.7 g/dL — ABNORMAL LOW (ref 6.5–8.1)

## 2019-01-21 LAB — TROPONIN I (HIGH SENSITIVITY)
Troponin I (High Sensitivity): 196 ng/L (ref <18)
Troponin I (High Sensitivity): 1838 ng/L (ref <18)

## 2019-01-21 LAB — SARS CORONAVIRUS 2 BY RT PCR (HOSPITAL ORDER, PERFORMED IN ~~LOC~~ HOSPITAL LAB): SARS Coronavirus 2: NEGATIVE

## 2019-01-21 LAB — TSH: TSH: 3.393 u[IU]/mL (ref 0.350–4.500)

## 2019-01-21 LAB — MAGNESIUM: Magnesium: 2.3 mg/dL (ref 1.7–2.4)

## 2019-01-21 LAB — BRAIN NATRIURETIC PEPTIDE: B Natriuretic Peptide: 353.6 pg/mL — ABNORMAL HIGH (ref 0.0–100.0)

## 2019-01-21 MED ORDER — ALBUTEROL SULFATE HFA 108 (90 BASE) MCG/ACT IN AERS
2.0000 | INHALATION_SPRAY | RESPIRATORY_TRACT | Status: AC
  Filled 2019-01-21: qty 6.7

## 2019-01-21 MED ORDER — ONDANSETRON HCL 4 MG/2ML IJ SOLN: 4.0000 mg | Freq: Four times a day (QID) | INTRAMUSCULAR | Status: DC | PRN

## 2019-01-21 MED ORDER — DILTIAZEM HCL 60 MG PO TABS
30.0000 mg | ORAL_TABLET | Freq: Four times a day (QID) | ORAL | Status: DC
  Administered 2019-01-21 – 2019-01-22 (×2): 30 mg via ORAL
  Filled 2019-01-21: qty 1

## 2019-01-21 MED ORDER — IPRATROPIUM-ALBUTEROL 0.5-2.5 (3) MG/3ML IN SOLN
3.0000 mL | Freq: Four times a day (QID) | RESPIRATORY_TRACT | Status: DC
  Administered 2019-01-21: 3 mL via RESPIRATORY_TRACT
  Filled 2019-01-21: qty 3

## 2019-01-21 MED ORDER — SODIUM CHLORIDE 0.9 % IV BOLUS
1000.0000 mL | Freq: Once | INTRAVENOUS | Status: AC
  Administered 2019-01-21: 1000 mL via INTRAVENOUS

## 2019-01-21 MED ORDER — SODIUM CHLORIDE 0.9% FLUSH
3.0000 mL | Freq: Two times a day (BID) | INTRAVENOUS | Status: DC
  Administered 2019-01-21 – 2019-01-25 (×8): 3 mL via INTRAVENOUS

## 2019-01-21 MED ORDER — SODIUM CHLORIDE 0.9 % IV SOLN
Freq: Once | INTRAVENOUS | Status: AC
  Administered 2019-01-22: 01:00:00 via INTRAVENOUS

## 2019-01-21 MED ORDER — IPRATROPIUM-ALBUTEROL 0.5-2.5 (3) MG/3ML IN SOLN
3.0000 mL | Freq: Two times a day (BID) | RESPIRATORY_TRACT | Status: DC
  Administered 2019-01-22 – 2019-01-25 (×7): 3 mL via RESPIRATORY_TRACT
  Filled 2019-01-21 (×8): qty 3

## 2019-01-21 MED ORDER — DILTIAZEM LOAD VIA INFUSION
10.0000 mg | Freq: Once | INTRAVENOUS | Status: AC
  Administered 2019-01-21: 10 mg via INTRAVENOUS
  Filled 2019-01-21: qty 10

## 2019-01-21 MED ORDER — ACETAMINOPHEN 325 MG PO TABS
650.0000 mg | ORAL_TABLET | ORAL | Status: DC | PRN
  Administered 2019-01-23: 650 mg via ORAL
  Filled 2019-01-21: qty 2

## 2019-01-21 MED ORDER — DILTIAZEM HCL-DEXTROSE 100-5 MG/100ML-% IV SOLN (PREMIX)
5.0000 mg/h | INTRAVENOUS | Status: DC
  Administered 2019-01-21: 16:00:00 5 mg/h via INTRAVENOUS
  Filled 2019-01-21 (×2): qty 100

## 2019-01-21 MED ORDER — MOMETASONE FURO-FORMOTEROL FUM 100-5 MCG/ACT IN AERO
2.0000 | INHALATION_SPRAY | Freq: Two times a day (BID) | RESPIRATORY_TRACT | Status: DC
  Administered 2019-01-22: 2 via RESPIRATORY_TRACT
  Filled 2019-01-21: qty 8.8

## 2019-01-21 MED ORDER — APIXABAN 2.5 MG PO TABS
2.5000 mg | ORAL_TABLET | Freq: Two times a day (BID) | ORAL | Status: DC
  Administered 2019-01-21: 2.5 mg via ORAL
  Filled 2019-01-21 (×2): qty 1

## 2019-01-21 NOTE — H&P (Signed)
History and Physical    Bianca [name]:[id number] DOB: 1941-04-01 DOA: 01/21/2019  PCP: Bianca Passy, MD  Patient coming from: Home via EMS  I have personally briefly reviewed patient's old medical records in [place]  Chief Complaint: Tension and tachycardia  HPI: Bianca [name] is a 78 y.o. female with medical history significant of COPD on daily inhalers, continued tobacco use, asthma, anxiety, hypertension, and arthritis who presents the emergency department episode of syncope at home.  Obtained from the chart,'s, and ED physician as I was unable to reach the patient's daughter by phone.  Patient arrived here via EMS from home the family had taken her to physical therapy in the morning and did not think she sounded like herself.  Went to the patient's home in the afternoon and found her on the bed slow to respond.  EMS reported a blood pressure of 68/42 and then 92/48.  Her temperature was 97.2 but her heart rate was 130-160.  With a respiration of 20.  In the emergency department she was started on a diltiazem drip and had significant relief of her symptoms however her heart rate continued to vacillate between the 160s and then below 30s.  Blood pressure stabilized in the 109/68 range.  Opponent was found to be elevated at 196, her creatinine elevated 2.03, glucose 141, EKG consistent with A. fib with RVR this was apparently a new problem.  Referred to me for further evaluation and management.   Review of Systems: As per HPI otherwise all other systems reviewed and  negative.   Past Medical History:  Diagnosis Date   Anxiety    Arthritis    Asthma    COPD (chronic obstructive pulmonary disease) ([place])    HOH (hard of hearing)    Hypertension    Wears dentures    top and partial bottom   Wears glasses     Past Surgical History:  Procedure Laterality Date   APPENDECTOMY     BREAST LUMPECTOMY WITH NEEDLE LOCALIZATION Right 04/21/2013   Procedure: BREAST  LUMPECTOMY WITH NEEDLE LOCALIZATION;  Surgeon: Bianca Bowie, MD;  Location: [place];  Service: General;  Laterality: Right;   BREAST SURGERY  1985   cyst rt br   MASTECTOMY W/ SENTINEL NODE BIOPSY Left 04/21/2013   Procedure: MASTECTOMY WITH SENTINEL LYMPH NODE BIOPSY;  Surgeon: Bianca Bowie, MD;  Location: [place];  Service: General;  Laterality: Left;   OTHER SURGICAL HISTORY     cysts removed from foot   OTHER SURGICAL HISTORY     cysts removed from breast   TUBAL LIGATION      Social History   Social History Narrative   Has a living will     reports that she has been smoking cigarettes. She has been smoking about 0.50 packs per day. She has never used smokeless tobacco. She reports that she does not drink alcohol or use drugs.  No Known Allergies  History reviewed. No pertinent family history.  Prior to Admission medications   Medication Sig Start Date End Date Taking? Authorizing Provider  ADVAIR DISKUS 100-50 MCG/DOSE AEPB TAKE 1 PUFF TWICE A DAY AS DIRECTED RINSE MOUTH AFTER USE Patient taking differently: Inhale 1 puff into the lungs 2 (two) times a day. RINSE MOUTH AFTER USE 06/30/18  Yes Bianca Passy, MD  busPIRone (BUSPAR) 15 MG tablet Take 1 tablet (15 mg total) by mouth 2 (two) times daily. 12/15/18  Yes  Bianca Passy, MD  Calcium-Magnesium (CAL/MAG PO) Take 1 tablet by mouth 3 (three) times a week.   Yes [provider]  Cholecalciferol (VITAMIN D3) 50 MCG (2000 UT) TABS Take 2,000 Units by mouth 3 (three) times a week.    Yes [provider]  fexofenadine (ALLEGRA) 180 MG tablet Take 180 mg by mouth daily.    Yes [provider]  lactose free nutrition (BOOST) LIQD Take 237 mLs by mouth 2 (two) times daily between meals.   Yes [provider]  losartan (COZAAR) 25 MG tablet Take 0.5 tablets (12.5 mg total) by mouth 2 (two) times daily. 12/26/18  Yes Bianca Passy, MD  mirtazapine  (REMERON) 15 MG tablet Take 1 tablet (15 mg total) by mouth at bedtime. 12/15/18  Yes Bianca Passy, MD  Misc Natural Products [facility] ADVANCED) CAPS Take 1-2 capsules by mouth 3 (three) times a week.   Yes [provider]  Multiple Vitamins-Minerals (CENTRUM SILVER 50+WOMEN) TABS Take 1 tablet by mouth 3 (three) times a week.   Yes [provider]  Omega-3 Fatty Acids (OMEGA 3 PO) Take 1-2 capsules by mouth daily.    Yes [provider]  PROAIR HFA 108 (90 Base) MCG/ACT inhaler INHALE 1-2 PUFFS INTO THE LUNGS EVERY 4 HOURS AS NEEDED Patient taking differently: Inhale 1-2 puffs into the lungs every 4 (four) hours as needed for wheezing or shortness of breath.  07/26/17  Yes Bianca Passy, MD  vitamin C (ASCORBIC ACID) 500 MG tablet Take 500-1,000 mg by mouth 3 (three) times a week.   Yes [provider]  UNABLE TO FIND Rx: K0938- Post Surgical Bras (Quantity: 6)\ Dx: 174.9; Left mastectomy 05/29/13   Bianca Keens, MD    Physical Exam:  Constitutional: Deep wheezing with rhonchorous cough appears acutely and chronically ill Vitals:   01/21/19 1645 01/21/19 1745 01/21/19 1845 01/21/19 1900  BP: 100/62 109/68 127/65 127/67  Pulse: (!) 31  82 80  Resp: 20 17 (!) 26 18  Temp:      TempSrc:      SpO2: 100%  98% 98%  Weight:      Height:       Eyes: PERRL, lids and conjunctivae normal ENMT: Mucous membranes are moist. Posterior pharynx clear of any exudate or lesions.Normal dentition.  Neck: normal, supple, no masses, no thyromegaly Respiratory: Coarse rhonchi with low pitched wheezing bilaterally, increased respiratory effort. No accessory muscle use.  Cardiovascular: Irregularly irregular rate and rhythm, no murmurs / rubs / gallops. No extremity edema. 2+ pedal pulses. No carotid bruits.  Abdomen: no tenderness, no masses palpated. No hepatosplenomegaly. Bowel sounds positive.  Musculoskeletal: no clubbing / cyanosis. No joint deformity upper and  lower extremities. Good ROM, no contractures. Normal muscle tone.  Skin: no rashes, lesions, ulcers. No induration Neurologic: CN 2-12 grossly intact. Sensation intact, DTR normal. Strength 5/5 in all 4.  Psychiatric: Normal judgment and insight. Alert and oriented x 3. Normal mood.    Labs on Admission: I have personally reviewed following labs and imaging studies  CBC: Recent Labs  Lab 01/21/19 1537  WBC 10.0  NEUTROABS 7.5  HGB 11.3*  HCT 35.4*  MCV 92.4  PLT 182   Basic Metabolic Panel: Recent Labs  Lab 01/21/19 1537  NA 140  K 4.6  CL 112*  CO2 20*  GLUCOSE 141*  BUN 28*  CREATININE 2.03*  CALCIUM 8.5*  MG 2.3   GFR: Estimated Creatinine Clearance: 17.7  mL/min (A) (by C-G formula based on SCr of 2.03 mg/dL (H)). Liver Function Tests: Recent Labs  Lab 01/21/19 1537  AST 24  ALT 16  ALKPHOS 53  BILITOT 0.7  PROT 5.7*  ALBUMIN 3.2*   Urine analysis:    Component Value Date/Time   COLORURINE YELLOW 09/13/2018 1419   APPEARANCEUR CLEAR 09/13/2018 1419   LABSPEC 1.010 09/13/2018 1419   PHURINE 7.0 09/13/2018 1419   GLUCOSEU NEGATIVE 09/13/2018 1419   HGBUR NEGATIVE 09/13/2018 1419   BILIRUBINUR NEGATIVE 09/13/2018 1419   BILIRUBINUR neg. 01/31/2013 1145   KETONESUR NEGATIVE 09/13/2018 1419   PROTEINUR 30+ 01/31/2013 1145   UROBILINOGEN 0.2 09/13/2018 1419   NITRITE NEGATIVE 09/13/2018 1419   LEUKOCYTESUR NEGATIVE 09/13/2018 1419    Radiological Exams on Admission: Ct Head Wo Contrast  Result Date: 01/21/2019 CLINICAL DATA:  Acute mental status change. EXAM: CT HEAD WITHOUT CONTRAST TECHNIQUE: Contiguous axial images were obtained from the base of the skull through the vertex without intravenous contrast. COMPARISON:  None. FINDINGS: Brain: No subdural, epidural, or subarachnoid hemorrhage. Ventricles and sulci are prominent. The cerebellum, brainstem, and basal cisterns are normal. No mass effect or midline shift. Moderate white matter changes are  noted. No acute cortical ischemia or infarct identified. Vascular: No hyperdense vessel or unexpected calcification. Skull: Normal. Negative for fracture or focal lesion. Sinuses/Orbits: No acute finding. Other: None. IMPRESSION: No acute intracranial abnormalities.  Chronic white matter changes. Electronically Signed   By: Dorise Bullion III M.D   On: 01/21/2019 17:21   Dg Chest Portable 1 View  Result Date: 01/21/2019 CLINICAL DATA:  Atrial fibrillation EXAM: PORTABLE CHEST 1 VIEW COMPARISON:  None. FINDINGS: The heart size and mediastinal contours are within normal limits. Both lungs are clear. The visualized skeletal structures are unremarkable. IMPRESSION: No active disease. Electronically Signed   By: Dorise Bullion III M.D   On: 01/21/2019 16:46    EKG: Independently reviewed.  A. fib with RVR when compared to previous A. fib is new  Assessment/Plan Principal Problem:   Atrial fibrillation with RVR (HCC) Active Problems:   HYPERTENSION, BENIGN ESSENTIAL   Asthma, chronic, unspecified asthma severity, with acute exacerbation   CHRONIC KIDNEY DISEASE STAGE III (MODERATE)   COPD (chronic obstructive pulmonary disease) (HCC)   Depression   Tobacco abuse    1.  Atrial fibrillation with rapid ventricular response.  Patient to be admitted on a diltiazem drip to the progressive unit.  We will start her on Eliquis renally dosed.  I personally spoke with cardiologist on-call who will see the patient.  Check an echocardiogram in a.m.  Her troponin was noted to be elevated but this is more consistent with strain.  2.  Hypertension, primary: Continue home medication management monitor blood pressures closely.  3.  Acute exacerbation of COPD with concurrent acute asthma exacerbation: Patient states that she has not had any of her Advair inhaler today.  We will order her Advair inhaler stat as well as a stat dose of ental inhaler.  Her inhalers back if she continues to have wheezing may need to  start a dose of steroids.  I think that her wheezing is being exacerbated by her heart rate.  States that this is no different than usual.  4.  Chronic kidney disease stage III: Noted avoid nephrotoxic agents.  Avoid dye load.  5.  Depression: Continue home medications.  6.  Tobacco abuse: Patient will need smoking cessation counseling prior to discharge.  DVT prophylaxis: Eliquis Code  Status: Full code Family Communication: Attempted to reach patient's daughter Ofilia Neas by phone but did not answer. Disposition Plan: Likely home in 3 to 4 days Consults called: Cardiology spoke with physician on call. Admission status: Inpatient Admit - It is my clinical opinion that admission to INPATIENT is reasonable and necessary because of the expectation that this patient will require hospital care that crosses at least 2 midnights to treat this condition based on the medical complexity of the problems presented.  Given the aforementioned information, the predictability of an adverse outcome is felt to be significant.   Lady Deutscher MD FACP [facility] Pager [phone]  How to contact the [facility] Attending or Consulting provider [place] or covering provider during after hours [place], for this patient?  1. Check the care team in [facility] and look for a) attending/consulting [facility] provider listed and b) the [facility] team listed 2. Log into [email or web address] and use [place]'s universal password to access. If you do not have the password, please contact the hospital operator. 3. Locate the [facility] provider you are looking for under [facility] and page to a number that you can be directly reached. 4. If you still have difficulty reaching the provider, please page the [facility] (Director on Call) for the Hospitalists listed on amion for assistance.  If 7PM-7AM, please contact night-coverage [email or web address] Password [email or web address]  01/21/2019, 7:45 PM

## 2019-01-21 NOTE — ED Triage Notes (Signed)
Pt arrived  Via Ems from home. Family had talked to PT in morning and Family  Did not think Pt sounded like herself. Family went to Pt home and found her on the bed slow to respond . Ems reported BP 68/42 and then 92/48. CBG 189 , Temp 97.2, HR 130 /160 , RESp 20

## 2019-01-21 NOTE — ED Provider Notes (Signed)
[facility] EMERGENCY DEPARTMENT Provider Note   CSN: [id number] Arrival date & time: 01/21/19  1511    History   Chief Complaint Chief Complaint  Patient presents with  . Tachycardia    HPI Bianca [name] is a 78 y.o. female.     The history is provided by the patient and a caregiver.  Near Syncope This is a new problem. The current episode started 3 to 5 hours ago. The problem has been resolved. Pertinent negatives include no chest pain, no abdominal pain, no headaches and no shortness of breath. Nothing aggravates the symptoms. Nothing relieves the symptoms. She has tried nothing for the symptoms. The treatment provided no relief.    Past Medical History:  Diagnosis Date  . Anxiety   . Arthritis   . Asthma   . COPD (chronic obstructive pulmonary disease) ([place])   . HOH (hard of hearing)   . Hypertension   . Wears dentures    top and partial bottom  . Wears glasses     Patient Active Problem List   Diagnosis Date Noted  . Atrial fibrillation with RVR ([place]) 01/21/2019  . Underweight due to inadequate caloric intake 12/06/2017  . Acute bronchitis with COPD ([place]) 03/29/2017  . Osteoporosis 07/02/2016  . Tobacco abuse 10/26/2013  . Breast cancer of upper-outer quadrant of left female breast ([place]) 03/06/2013  . COPD (chronic obstructive pulmonary disease) ([place]) 06/09/2012  . Adjustment disorder 11/25/2010  . CHRONIC KIDNEY DISEASE STAGE III (MODERATE) 11/13/2009  . Depression 05/04/2007  . Asthma, chronic, unspecified asthma severity, with acute exacerbation 05/03/2007  . HYPERTENSION, BENIGN ESSENTIAL 12/06/2006  . ALLERGIC  RHINITIS 12/06/2006    Past Surgical History:  Procedure Laterality Date  . APPENDECTOMY    . BREAST LUMPECTOMY WITH NEEDLE LOCALIZATION Right 04/21/2013   Procedure: BREAST LUMPECTOMY WITH NEEDLE LOCALIZATION;  Surgeon: Harl Bowie, MD;  Location: [facility];  Service: General;  Laterality: Right;  .  BREAST SURGERY  1985   cyst rt br  . MASTECTOMY W/ SENTINEL NODE BIOPSY Left 04/21/2013   Procedure: MASTECTOMY WITH SENTINEL LYMPH NODE BIOPSY;  Surgeon: Harl Bowie, MD;  Location: [facility];  Service: General;  Laterality: Left;  . OTHER SURGICAL HISTORY     cysts removed from foot  . OTHER SURGICAL HISTORY     cysts removed from breast  . TUBAL LIGATION       OB History   No obstetric history on file.      Home Medications    Prior to Admission medications   Medication Sig Start Date End Date Taking? Authorizing Provider  ADVAIR DISKUS 100-50 MCG/DOSE AEPB TAKE 1 PUFF TWICE A DAY AS DIRECTED RINSE MOUTH AFTER USE Patient taking differently: Inhale 1 puff into the lungs 2 (two) times a day. RINSE MOUTH AFTER USE 06/30/18   Lucille Passy, MD  busPIRone (BUSPAR) 15 MG tablet Take 1 tablet (15 mg total) by mouth 2 (two) times daily. 12/15/18   Lucille Passy, MD  CALCIUM PO Take by mouth.    [provider]  Cholecalciferol (VITAMIN D-3 PO) Take by mouth.    [provider]  fexofenadine (ALLEGRA) 180 MG tablet Take 180 mg by mouth as directed.      [provider]  lactose free nutrition (BOOST) LIQD Take 237 mLs by mouth 2 (two) times daily between meals.    [provider]  losartan (COZAAR) 25 MG tablet Take 0.5  tablets (12.5 mg total) by mouth 2 (two) times daily. 12/26/18   Lucille Passy, MD  mirtazapine (REMERON) 15 MG tablet Take 1 tablet (15 mg total) by mouth at bedtime. 12/15/18   Lucille Passy, MD  Multiple Vitamin (MULTIVITAMIN) tablet Take 1 tablet by mouth daily.      [provider]  Omega-3 Fatty Acids (OMEGA 3 PO) Take by mouth.    [provider]  PROAIR HFA 108 (90 Base) MCG/ACT inhaler INHALE 1-2 PUFFS INTO THE LUNGS EVERY 4 HOURS AS NEEDED Patient taking differently: Inhale 1-2 puffs into the lungs every 4 (four) hours as needed for wheezing or shortness of breath.  07/26/17   Lucille Passy, MD   UNABLE TO FIND Rx: [phone]- Post Surgical Bras (Quantity: 6)\ Dx: 174.9; Left mastectomy 05/29/13   Coralie Keens, MD    Family History History reviewed. No pertinent family history.  Social History Social History   Tobacco Use  . Smoking status: Current Some Day Smoker    Packs/day: 0.50    Types: Cigarettes  . Smokeless tobacco: Never Used  Substance Use Topics  . Alcohol use: No  . Drug use: No     Allergies   Patient has no known allergies.   Review of Systems Review of Systems  Constitutional: Negative for chills and fever.  HENT: Negative for ear pain and sore throat.   Eyes: Negative for pain and visual disturbance.  Respiratory: Negative for cough and shortness of breath.   Cardiovascular: Positive for near-syncope. Negative for chest pain and palpitations.  Gastrointestinal: Negative for abdominal pain and vomiting.  Genitourinary: Negative for dysuria and hematuria.  Musculoskeletal: Negative for arthralgias and back pain.  Skin: Negative for color change and rash.  Neurological: Positive for light-headedness. Negative for seizures, syncope and headaches.  All other systems reviewed and are negative.    Physical Exam Updated Vital Signs  ED Triage Vitals  Enc Vitals Group     BP 01/21/19 1530 (!) 116/104     Pulse Rate 01/21/19 1530 (!) 160     Resp 01/21/19 1530 20     Temp 01/21/19 1530 98.8 F (37.1 C)     Temp Source 01/21/19 1530 Oral     SpO2 01/21/19 1530 100 %     Weight 01/21/19 1532 110 lb (49.9 kg)     Height 01/21/19 1532 5' 5.5" (1.664 m)     Head Circumference --      Peak Flow --      Pain Score 01/21/19 1531 0     Pain Loc --      Pain Edu? --      Excl. in [place]? --     Physical Exam Vitals signs and nursing note reviewed.  Constitutional:      General: She is not in acute distress.    Appearance: She is well-developed.  HENT:     Head: Normocephalic and atraumatic.     Nose: Nose normal.     Mouth/Throat:     Mouth:  Mucous membranes are moist.  Eyes:     Conjunctiva/sclera: Conjunctivae normal.     Pupils: Pupils are equal, round, and reactive to light.  Neck:     Musculoskeletal: Normal range of motion and neck supple.  Cardiovascular:     Rate and Rhythm: Normal rate and regular rhythm.     Pulses: Normal pulses.     Heart sounds: Normal heart sounds. No murmur.  Pulmonary:  Effort: Pulmonary effort is normal. No respiratory distress.     Breath sounds: Normal breath sounds.  Abdominal:     General: There is no distension.     Palpations: Abdomen is soft.     Tenderness: There is no abdominal tenderness.  Musculoskeletal: Normal range of motion.  Skin:    General: Skin is warm and dry.  Neurological:     General: No focal deficit present.     Mental Status: She is alert and oriented to person, place, and time.     Cranial Nerves: No cranial nerve deficit.     Sensory: No sensory deficit.     Motor: No weakness.      ED Treatments / Results  Labs (all labs ordered are listed, but only abnormal results are displayed) Labs Reviewed  CBC WITH DIFFERENTIAL/PLATELET - Abnormal; Notable for the following components:      Result Value   RBC 3.83 (*)    Hemoglobin 11.3 (*)    HCT 35.4 (*)    All other components within normal limits  BASIC METABOLIC PANEL - Abnormal; Notable for the following components:   Chloride 112 (*)    CO2 20 (*)    Glucose, Bld 141 (*)    BUN 28 (*)    Creatinine, Ser 2.03 (*)    Calcium 8.5 (*)    GFR calc non Af Amer 23 (*)    GFR calc Af Amer 26 (*)    All other components within normal limits  HEPATIC FUNCTION PANEL - Abnormal; Notable for the following components:   Total Protein 5.7 (*)    Albumin 3.2 (*)    All other components within normal limits  TROPONIN I (HIGH SENSITIVITY) - Abnormal; Notable for the following components:   Troponin I (High Sensitivity) 196 (*)    All other components within normal limits  SARS CORONAVIRUS 2 (HOSPITAL  ORDER, PERFORMED IN [facility])  URINE CULTURE  MAGNESIUM  URINALYSIS, ROUTINE W REFLEX MICROSCOPIC  TSH  TROPONIN I (HIGH SENSITIVITY)    EKG EKG Interpretation  Date/Time:  Saturday January 21 2019 15:22:08 EDT Ventricular Rate:  152 PR Interval:    QRS Duration: 59 QT Interval:  315 QTC Calculation: 501 R Axis:   83 Text Interpretation:  Atrial fibrillation with rapid V-rate Borderline right axis deviation Anteroseptal infarct, old Repolarization abnormality, prob rate related Confirmed by Lennice Sites [phone]) on 01/21/2019 4:20:34 PM   Radiology Ct Head Wo Contrast  Result Date: 01/21/2019 CLINICAL DATA:  Acute mental status change. EXAM: CT HEAD WITHOUT CONTRAST TECHNIQUE: Contiguous axial images were obtained from the base of the skull through the vertex without intravenous contrast. COMPARISON:  None. FINDINGS: Brain: No subdural, epidural, or subarachnoid hemorrhage. Ventricles and sulci are prominent. The cerebellum, brainstem, and basal cisterns are normal. No mass effect or midline shift. Moderate white matter changes are noted. No acute cortical ischemia or infarct identified. Vascular: No hyperdense vessel or unexpected calcification. Skull: Normal. Negative for fracture or focal lesion. Sinuses/Orbits: No acute finding. Other: None. IMPRESSION: No acute intracranial abnormalities.  Chronic white matter changes. Electronically Signed   By: Dorise Bullion III M.D   On: 01/21/2019 17:21   Dg Chest Portable 1 View  Result Date: 01/21/2019 CLINICAL DATA:  Atrial fibrillation EXAM: PORTABLE CHEST 1 VIEW COMPARISON:  None. FINDINGS: The heart size and mediastinal contours are within normal limits. Both lungs are clear. The visualized skeletal structures are unremarkable. IMPRESSION: No active disease. Electronically Signed  By: Dorise Bullion III M.D   On: 01/21/2019 16:46    Procedures .Critical Care Performed by: Lennice Sites, DO Authorized by: Lennice Sites,  DO   Critical care provider statement:    Critical care time (minutes):  28   Critical care was necessary to treat or prevent imminent or life-threatening deterioration of the following conditions:  Cardiac failure   Critical care was time spent personally by me on the following activities:  Blood draw for specimens, development of treatment plan with patient or surrogate, discussions with primary provider, evaluation of patient's response to treatment, examination of patient, obtaining history from patient or surrogate, ordering and performing treatments and interventions, ordering and review of laboratory studies, ordering and review of radiographic studies, pulse oximetry, re-evaluation of patient's condition and review of old charts   I assumed direction of critical care for this patient from another provider in my specialty: no     (including critical care time)  Medications Ordered in ED Medications  diltiazem (CARDIZEM) 1 mg/mL load via infusion 10 mg (10 mg Intravenous Bolus from Bag 01/21/19 1606)    And  diltiazem (CARDIZEM) 100 mg in dextrose 5% 1106mL (1 mg/mL) infusion (5 mg/hr Intravenous New Bag/Given 01/21/19 1609)  0.9 %  sodium chloride infusion (has no administration in time range)  albuterol (VENTOLIN HFA) 108 (90 Base) MCG/ACT inhaler 2 puff (has no administration in time range)  mometasone-formoterol (DULERA) 100-5 MCG/ACT inhaler 2 puff (has no administration in time range)  sodium chloride 0.9 % bolus 1,000 mL (0 mLs Intravenous Stopped 01/21/19 1757)     Initial Impression / Assessment and Plan / ED Course  I have reviewed the triage vital signs and the nursing notes.  Pertinent labs & imaging results that were available during my care of the patient were reviewed by me and considered in my medical decision making (see chart for details).     ALTON TREMBLAY is a 78 year old female history of hypertension, COPD who presents to the ED after syncopal episode.  Patient  possibly hypotensive upon arrival of EMS.  Tachycardic.  Patient with atrial fibrillation with RVR upon arrival.  Blood pressure is normal.  No fever.  Patient overall feels well.  Denies any chest pain or shortness of breath.  Family states that patient has been increasingly fatigued over the last several days.  Family found her passed out on the bed.  Unaware if she hit head or not.  Denies any urinary symptoms.  No abdominal pain.  Will start diltiazem bolus and infusion for A. fib with RVR.  Will give normal saline bolus.  Will get lab work including head CT, troponin.  Will evaluate for causes of new onset atrial fibrillation.  Patient with elevated troponin.  Likely from demand.  However no significant anemia, electrolyte abnormality.  Mild elevation in creatinine.  CT head unremarkable.  Chest x-ray without any signs of infection.  Coronavirus test negative.  Patient admitted for new A. fib with RVR causing troponin leak.  Admitted in hemodynamically stable condition.  This chart was dictated using voice recognition software.  Despite best efforts to proofread,  errors can occur which can change the documentation meaning.   Final Clinical Impressions(s) / ED Diagnoses   Final diagnoses:  Atrial fibrillation with RVR ([place])  Elevated troponin    ED Discharge Orders    None       Lennice Sites, DO 01/21/19 6045

## 2019-01-21 NOTE — Consult Note (Signed)
Cardiology Consult Note   Primary Physician: Lucille Passy, MD PCP-Cardiologist:  No primary care provider on file.  Reason for Consultation: Atrial Fibrillation  HPI:    Bianca [name] is seen today for evaluation of atrial fibrillation at the request of Dr. Evangeline Gula.   78 y.o. female with COPD, tobacco use, anxiety, hypertension who was admitted to the hospitalist service for lethargy, hypotension, and afib with RVR.  She has been feeling she has been feeling tired for the past 2 to 3 days definitely more lethargic than normal.  She says that because of this her daughter checked her blood pressure at home and found that the blood pressure was very low and therefore called the ambulance to bring her to the hospital.  She denies chest pain or shortness of breath she thinks she was lightheaded but did not pass out.  By the time of my interview she is actually now in a normal rhythm with a heart rate in the 80s and she says she feels much better now than she has in the past few days.  EKG in the emergency room showed atrial fibrillation with RVR she was started on a diltiazem drip which has converted her to normal sinus rhythm.  She denies having history of A. fib before or with any cardiac history denies MI stents or heart failure.  Review of Systems: [y] = yes, [ ]  = no   . General: Weight gain [ ] ; Weight loss [ ] ; Anorexia [yes]; Fatigue [ ] ; Fever [ ] ; Chills [ ] ; Weakness [ ]   . Cardiac: Chest pain/pressure [ ] ; Resting SOB [ ] ; Exertional SOB [ ] ; Orthopnea [ ] ; Pedal Edema [ ] ; Palpitations [ ] ; Syncope [ ] ; Presyncope [yes]; Paroxysmal nocturnal dyspnea[ ]   . Pulmonary: Cough [ ] ; Wheezing[ ] ; Hemoptysis[ ] ; Sputum [ ] ; Snoring [ ]   . GI: Vomiting[ ] ; Dysphagia[ ] ; Melena[ ] ; Hematochezia [ ] ; Heartburn[ ] ; Abdominal pain [ ] ; Constipation [ ] ; Diarrhea [ ] ; BRBPR [ ]   . GU: Hematuria[ ] ; Dysuria [ ] ; Nocturia[ ]   . Vascular: Pain in legs with walking [ ] ; Pain in feet with  lying flat [ ] ; Non-healing sores [ ] ; Stroke [ ] ; TIA [ ] ; Slurred speech [ ] ;  . Neuro: Headaches[ ] ; Vertigo[ ] ; Seizures[ ] ; Paresthesias[ ] ;Blurred vision [ ] ; Diplopia [ ] ; Vision changes [ ]   . Ortho/Skin: Arthritis [ ] ; Joint pain [ ] ; Muscle pain [ ] ; Joint swelling [ ] ; Back Pain [ ] ; Rash [ ]   . Psych: Depression[ ] ; Anxiety[ ]   . Heme: Bleeding problems [ ] ; Clotting disorders [ ] ; Anemia [ ]   . Endocrine: Diabetes [ ] ; Thyroid dysfunction[ ]   Home Medications Prior to Admission medications   Medication Sig Start Date End Date Taking? Authorizing Provider  ADVAIR DISKUS 100-50 MCG/DOSE AEPB TAKE 1 PUFF TWICE A DAY AS DIRECTED RINSE MOUTH AFTER USE Patient taking differently: Inhale 1 puff into the lungs 2 (two) times a day. RINSE MOUTH AFTER USE 06/30/18  Yes Lucille Passy, MD  busPIRone (BUSPAR) 15 MG tablet Take 1 tablet (15 mg total) by mouth 2 (two) times daily. 12/15/18  Yes Lucille Passy, MD  Calcium-Magnesium (CAL/MAG PO) Take 1 tablet by mouth 3 (three) times a week.   Yes [provider]  Cholecalciferol (VITAMIN D3) 50 MCG (2000 UT) TABS Take 2,000 Units by mouth 3 (three) times a week.    Yes [provider]  fexofenadine (ALLEGRA) 180 MG tablet Take 180 mg by mouth daily.    Yes [provider]  lactose free nutrition (BOOST) LIQD Take 237 mLs by mouth 2 (two) times daily between meals.   Yes [provider]  losartan (COZAAR) 25 MG tablet Take 0.5 tablets (12.5 mg total) by mouth 2 (two) times daily. 12/26/18  Yes Lucille Passy, MD  mirtazapine (REMERON) 15 MG tablet Take 1 tablet (15 mg total) by mouth at bedtime. 12/15/18  Yes Lucille Passy, MD  Misc Natural Products [facility] ADVANCED) CAPS Take 1-2 capsules by mouth 3 (three) times a week.   Yes [provider]  Multiple Vitamins-Minerals (CENTRUM SILVER 50+WOMEN) TABS Take 1 tablet by mouth 3 (three) times a week.   Yes [provider]  Omega-3 Fatty Acids (OMEGA  3 PO) Take 1-2 capsules by mouth daily.    Yes [provider]  PROAIR HFA 108 (90 Base) MCG/ACT inhaler INHALE 1-2 PUFFS INTO THE LUNGS EVERY 4 HOURS AS NEEDED Patient taking differently: Inhale 1-2 puffs into the lungs every 4 (four) hours as needed for wheezing or shortness of breath.  07/26/17  Yes Lucille Passy, MD  vitamin C (ASCORBIC ACID) 500 MG tablet Take 500-1,000 mg by mouth 3 (three) times a week.   Yes [provider]  UNABLE TO FIND Rx: F0932- Post Surgical Bras (Quantity: 6)\ Dx: 174.9; Left mastectomy 05/29/13   Coralie Keens, MD    Past Medical History: Past Medical History:  Diagnosis Date  . Anxiety   . Arthritis   . Asthma   . COPD (chronic obstructive pulmonary disease) ([place])   . HOH (hard of hearing)   . Hypertension   . Wears dentures    top and partial bottom  . Wears glasses     Past Surgical History: Past Surgical History:  Procedure Laterality Date  . APPENDECTOMY    . BREAST LUMPECTOMY WITH NEEDLE LOCALIZATION Right 04/21/2013   Procedure: BREAST LUMPECTOMY WITH NEEDLE LOCALIZATION;  Surgeon: Harl Bowie, MD;  Location: [place];  Service: General;  Laterality: Right;  . BREAST SURGERY  1985   cyst rt br  . MASTECTOMY W/ SENTINEL NODE BIOPSY Left 04/21/2013   Procedure: MASTECTOMY WITH SENTINEL LYMPH NODE BIOPSY;  Surgeon: Harl Bowie, MD;  Location: [place];  Service: General;  Laterality: Left;  . OTHER SURGICAL HISTORY     cysts removed from foot  . OTHER SURGICAL HISTORY     cysts removed from breast  . TUBAL LIGATION      Family History: History reviewed. No pertinent family history.  Social History: Social History   Socioeconomic History  . Marital status: Married    Spouse name: Not on file  . Number of children: Not on file  . Years of education: Not on file  . Highest education level: Not on file  Occupational History  . Occupation: retired    Fish farm manager: RETIRED   Social Needs  . Financial resource strain: Not on file  . Food insecurity    Worry: Not on file    Inability: Not on file  . Transportation needs    Medical: Not on file    Non-medical: Not on file  Tobacco Use  . Smoking status: Current Some Day Smoker    Packs/day: 0.50    Types: Cigarettes  . Smokeless tobacco: Never Used  Substance and Sexual Activity  . Alcohol use: No  . Drug use:  No  . Sexual activity: Not on file    Comment: 1-2 daily  Lifestyle  . Physical activity    Days per week: Not on file    Minutes per session: Not on file  . Stress: Not on file  Relationships  . Social Herbalist on phone: Not on file    Gets together: Not on file    Attends religious service: Not on file    Active member of club or organization: Not on file    Attends meetings of clubs or organizations: Not on file    Relationship status: Not on file  Other Topics Concern  . Not on file  Social History Narrative   Has a living will    Allergies:  No Known Allergies  Objective:    Vital Signs:   Temp:  [98.8 F (37.1 C)] 98.8 F (37.1 C) (07/04 1530) Pulse Rate:  [31-160] 80 (07/04 1900) Resp:  [12-26] 18 (07/04 1900) BP: (97-127)/(62-104) 127/67 (07/04 1900) SpO2:  [98 %-100 %] 98 % (07/04 1900) Weight:  [49.9 kg] 49.9 kg (07/04 1532)    Weight change: Filed Weights   01/21/19 1532  Weight: 49.9 kg    Intake/Output:   Intake/Output Summary (Last 24 hours) at 01/21/2019 2034 Last data filed at 01/21/2019 1757 Gross per 24 hour  Intake 1000 ml  Output -  Net 1000 ml      Physical Exam    General:  Well appearing. No resp difficulty HEENT: normal Neck: supple. JVP . Carotids 2+ bilat; no bruits. No lymphadenopathy or thyromegaly appreciated. Cor: PMI nondisplaced. Regular rate & rhythm. No rubs, gallops or murmurs. Lungs: Diffusely coarse, breathing comfortably. Abdomen: soft, nontender, nondistended. No hepatosplenomegaly. No bruits or masses. Good  bowel sounds. Extremities: no cyanosis, clubbing, rash, edema Neuro: alert & orientedx3, cranial nerves grossly intact. moves all 4 extremities w/o difficulty. Affect pleasant   Telemetry   NSR converted from AFib  EKG    Afib with RVR at admission  Labs   Basic Metabolic Panel: Recent Labs  Lab 01/21/19 1537  NA 140  K 4.6  CL 112*  CO2 20*  GLUCOSE 141*  BUN 28*  CREATININE 2.03*  CALCIUM 8.5*  MG 2.3    Liver Function Tests: Recent Labs  Lab 01/21/19 1537  AST 24  ALT 16  ALKPHOS 53  BILITOT 0.7  PROT 5.7*  ALBUMIN 3.2*   No results for input(s): LIPASE, AMYLASE in the last 168 hours. No results for input(s): AMMONIA in the last 168 hours.  CBC: Recent Labs  Lab 01/21/19 1537  WBC 10.0  NEUTROABS 7.5  HGB 11.3*  HCT 35.4*  MCV 92.4  PLT 284    Cardiac Enzymes: No results for input(s): CKTOTAL, CKMB, CKMBINDEX, TROPONINI in the last 168 hours.  BNP: BNP (last 3 results) Recent Labs    01/21/19 1902  BNP 353.6*    ProBNP (last 3 results) No results for input(s): PROBNP in the last 8760 hours.   CBG: No results for input(s): GLUCAP in the last 168 hours.  Coagulation Studies: No results for input(s): LABPROT, INR in the last 72 hours.   Imaging   Ct Head Wo Contrast  Result Date: 01/21/2019 CLINICAL DATA:  Acute mental status change. EXAM: CT HEAD WITHOUT CONTRAST TECHNIQUE: Contiguous axial images were obtained from the base of the skull through the vertex without intravenous contrast. COMPARISON:  None. FINDINGS: Brain: No subdural, epidural, or subarachnoid hemorrhage. Ventricles and sulci  are prominent. The cerebellum, brainstem, and basal cisterns are normal. No mass effect or midline shift. Moderate white matter changes are noted. No acute cortical ischemia or infarct identified. Vascular: No hyperdense vessel or unexpected calcification. Skull: Normal. Negative for fracture or focal lesion. Sinuses/Orbits: No acute finding.  Other: None. IMPRESSION: No acute intracranial abnormalities.  Chronic white matter changes. Electronically Signed   By: Dorise Bullion III M.D   On: 01/21/2019 17:21   Dg Chest Portable 1 View  Result Date: 01/21/2019 CLINICAL DATA:  Atrial fibrillation EXAM: PORTABLE CHEST 1 VIEW COMPARISON:  None. FINDINGS: The heart size and mediastinal contours are within normal limits. Both lungs are clear. The visualized skeletal structures are unremarkable. IMPRESSION: No active disease. Electronically Signed   By: Dorise Bullion III M.D   On: 01/21/2019 16:46      Medications:     Current Medications: . albuterol  2 puff Inhalation STAT  . apixaban  2.5 mg Oral BID  . ipratropium-albuterol  3 mL Nebulization Q6H  . mometasone-formoterol  2 puff Inhalation BID  . sodium chloride flush  3 mL Intravenous Q12H     Infusions: . sodium chloride    . diltiazem (CARDIZEM) infusion 5 mg/hr (01/21/19 1945)     Assessment/Plan  78 yo woman with COPD, HTN, smoking Admitted with new onset Afib with RVR, now converted to NSR On diltiazem gtt and converted Recommend to start cardizem 30mg  q6 for now and then transition to longer acting agent tomorrow Patient was started on Eliquis 2.5 BID (patient with Cr 2.0 and wt 51 kg) CHADS2VASC 4 and HAS-BLED 3 Agree with IVFs for AKI.  Patient breathing comfortably.    Patient has some ST changes on her EKG when she was in A. fib with RVR.  High sensitivity troponin now up to 1800.  However clinical history not really consistent with acute coronary syndrome and more likely related to significant hypotension with systolics in the 15P.  She is now feeling better and asymptomatic with her heart rhythm controlled and blood pressure normal. Would hold off on any ischemic work-up requiring contrast until renal function is improved.  Check new EKG now that she is in normal sinus rhythm on telemetry. Check lipid panel.  Check echocardiogram.  BNP is mildly elevated.  Further work-up/treatment depending on clinical course and testing results. Thank you for the consultation  Length of Stay: 0  Tressie Stalker, MD  Cardiology 01/21/2019, 8:34 PM

## 2019-01-22 ENCOUNTER — Inpatient Hospital Stay (HOSPITAL_COMMUNITY): Payer: PPO

## 2019-01-22 DIAGNOSIS — I340 Nonrheumatic mitral (valve) insufficiency: Secondary | ICD-10-CM

## 2019-01-22 DIAGNOSIS — I4891 Unspecified atrial fibrillation: Secondary | ICD-10-CM

## 2019-01-22 DIAGNOSIS — I361 Nonrheumatic tricuspid (valve) insufficiency: Secondary | ICD-10-CM

## 2019-01-22 LAB — CBC
HCT: 30.8 % — ABNORMAL LOW (ref 36.0–46.0)
Hemoglobin: 10.2 g/dL — ABNORMAL LOW (ref 12.0–15.0)
MCH: 29.4 pg (ref 26.0–34.0)
MCHC: 33.1 g/dL (ref 30.0–36.0)
MCV: 88.8 fL (ref 80.0–100.0)
Platelets: 265 10*3/uL (ref 150–400)
RBC: 3.47 MIL/uL — ABNORMAL LOW (ref 3.87–5.11)
RDW: 13.8 % (ref 11.5–15.5)
WBC: 9.4 10*3/uL (ref 4.0–10.5)
nRBC: 0 % (ref 0.0–0.2)

## 2019-01-22 LAB — BASIC METABOLIC PANEL
Anion gap: 7 (ref 5–15)
BUN: 26 mg/dL — ABNORMAL HIGH (ref 8–23)
CO2: 21 mmol/L — ABNORMAL LOW (ref 22–32)
Calcium: 8.1 mg/dL — ABNORMAL LOW (ref 8.9–10.3)
Chloride: 114 mmol/L — ABNORMAL HIGH (ref 98–111)
Creatinine, Ser: 1.59 mg/dL — ABNORMAL HIGH (ref 0.44–1.00)
GFR calc Af Amer: 35 mL/min — ABNORMAL LOW (ref >60)
GFR calc non Af Amer: 31 mL/min — ABNORMAL LOW (ref >60)
Glucose, Bld: 90 mg/dL (ref 70–99)
Potassium: 3.8 mmol/L (ref 3.5–5.1)
Sodium: 142 mmol/L (ref 135–145)

## 2019-01-22 LAB — LIPID PANEL
Cholesterol: 130 mg/dL (ref 0–200)
HDL: 45 mg/dL (ref >40)
LDL Cholesterol: 68 mg/dL (ref 0–99)
Total CHOL/HDL Ratio: 2.9 RATIO
Triglycerides: 86 mg/dL (ref <150)
VLDL: 17 mg/dL (ref 0–40)

## 2019-01-22 LAB — ECHOCARDIOGRAM COMPLETE
Height: 65 in
Weight: 1809.54 oz

## 2019-01-22 LAB — HIV ANTIBODY (ROUTINE TESTING W REFLEX): HIV Screen 4th Generation wRfx: NONREACTIVE

## 2019-01-22 LAB — MAGNESIUM: Magnesium: 2 mg/dL (ref 1.7–2.4)

## 2019-01-22 MED ORDER — DILTIAZEM HCL ER COATED BEADS 180 MG PO CP24
180.0000 mg | ORAL_CAPSULE | Freq: Every day | ORAL | Status: DC
  Administered 2019-01-22: 180 mg via ORAL
  Filled 2019-01-22: qty 1

## 2019-01-22 MED ORDER — BUSPIRONE HCL 5 MG PO TABS
15.0000 mg | ORAL_TABLET | Freq: Two times a day (BID) | ORAL | Status: DC
  Administered 2019-01-22 – 2019-01-25 (×7): 15 mg via ORAL
  Filled 2019-01-22 (×7): qty 3

## 2019-01-22 MED ORDER — SODIUM CHLORIDE 0.9 % IV SOLN
INTRAVENOUS | Status: DC
  Administered 2019-01-22 (×2): via INTRAVENOUS

## 2019-01-22 MED ORDER — VITAMIN C 500 MG PO TABS
500.0000 mg | ORAL_TABLET | ORAL | Status: DC
  Administered 2019-01-23: 1000 mg via ORAL
  Administered 2019-01-25: 500 mg via ORAL
  Filled 2019-01-22: qty 2
  Filled 2019-01-22 (×2): qty 1

## 2019-01-22 MED ORDER — MOMETASONE FURO-FORMOTEROL FUM 100-5 MCG/ACT IN AERO
2.0000 | INHALATION_SPRAY | Freq: Two times a day (BID) | RESPIRATORY_TRACT | Status: DC
  Administered 2019-01-22 – 2019-01-25 (×6): 2 via RESPIRATORY_TRACT
  Filled 2019-01-22: qty 8.8

## 2019-01-22 MED ORDER — ADULT MULTIVITAMIN W/MINERALS CH
1.0000 | ORAL_TABLET | ORAL | Status: DC
  Administered 2019-01-23 – 2019-01-25 (×2): 1 via ORAL
  Filled 2019-01-22 (×2): qty 1

## 2019-01-22 MED ORDER — MIRTAZAPINE 7.5 MG PO TABS
15.0000 mg | ORAL_TABLET | Freq: Every day | ORAL | Status: DC
  Administered 2019-01-22 – 2019-01-24 (×3): 15 mg via ORAL
  Filled 2019-01-22 (×3): qty 2

## 2019-01-22 MED ORDER — ALBUTEROL SULFATE (2.5 MG/3ML) 0.083% IN NEBU
2.5000 mg | INHALATION_SOLUTION | RESPIRATORY_TRACT | Status: DC | PRN
  Administered 2019-01-23 – 2019-01-24 (×2): 2.5 mg via RESPIRATORY_TRACT
  Filled 2019-01-22 (×2): qty 3

## 2019-01-22 NOTE — Evaluation (Signed)
Physical Therapy Evaluation Patient Details Name: Bianca [name] MRN: [id number] DOB: 1940/12/24 Today's Date: 01/22/2019   History of Present Illness  Pt adm after syncope at home. Found to have afib with rvr with orthostatic hypotension. PMH - copd,asthma, anxiety, htn,arthritis, breast CA  Clinical Impression  Pt admitted with above diagnosis and presents to PT with functional limitations due to deficits listed below (See PT problem list). Pt needs skilled PT to maximize independence and safety to allow discharge to home with HHPT.  Orthostatic BPs  Supine 139/67  Sitting 139/71  Standing 105/91   Pt denied any dizziness or lightheadedness.       Follow Up Recommendations Home health PT;Supervision - Intermittent    Equipment Recommendations  None recommended by PT    Recommendations for Other Services       Precautions / Restrictions Precautions Precautions: Fall      Mobility  Bed Mobility Overal bed mobility: Modified Independent             General bed mobility comments: Incr time  Transfers Overall transfer level: Needs assistance Equipment used: None Transfers: Sit to/from Stand Sit to Stand: Supervision         General transfer comment: incr time to rise  Ambulation/Gait Ambulation/Gait assistance: Min guard;Supervision Gait Distance (Feet): 150 Feet Assistive device: 4-wheeled walker;None Gait Pattern/deviations: Step-through pattern;Decreased stride length Gait velocity: decr Gait velocity interpretation: 1.31 - 2.62 ft/sec, indicative of limited community ambulator General Gait Details: Initially pt slightly unsteady and required min guard and used rollator. As distance incr pt became more stable and able to amb in room with supervision and no assistive device  Stairs            Wheelchair Mobility    Modified Rankin (Stroke Patients Only)       Balance Overall balance assessment: Needs assistance Sitting-balance support: No  upper extremity supported;Feet supported Sitting balance-Leahy Scale: Good     Standing balance support: No upper extremity supported;During functional activity Standing balance-Leahy Scale: Fair                               Pertinent Vitals/Pain Pain Assessment: No/denies pain    Home Living Family/patient expects to be discharged to:: Private residence Living Arrangements: Alone Available Help at Discharge: Family;Available PRN/intermittently Type of Home: House Home Access: Stairs to enter   Entrance Stairs-Number of Steps: 1 Home Layout: One level Home Equipment: Walker - 4 wheels;Shower seat(Pt unsure her walker has 4 wheel. Shower seat was husbands)      Prior Function Level of Independence: Independent         Comments: drives     Hand Dominance   Dominant Hand: Right    Extremity/Trunk Assessment   Upper Extremity Assessment Upper Extremity Assessment: Defer to OT evaluation    Lower Extremity Assessment Lower Extremity Assessment: Generalized weakness       Communication   Communication: HOH  Cognition Arousal/Alertness: Awake/alert Behavior During Therapy: WFL for tasks assessed/performed Overall Cognitive Status: Within Functional Limits for tasks assessed                                 General Comments: slow to respond at times but believe this is due to hearing loss      General Comments General comments (skin integrity, edema, etc.): See orthostatics in flow sheet  Exercises     Assessment/Plan    PT Assessment Patient needs continued PT services  PT Problem List Decreased strength;Decreased activity tolerance;Decreased balance;Decreased mobility       PT Treatment Interventions DME instruction;Gait training;Functional mobility training;Therapeutic activities;Therapeutic exercise;Balance training;Patient/family education    PT Goals (Current goals can be found in the Care Plan section)  Acute Rehab  PT Goals Patient Stated Goal: return home PT Goal Formulation: With patient Time For Goal Achievement: 01/29/19 Potential to Achieve Goals: Good    Frequency Min 3X/week   Barriers to discharge Decreased caregiver support lives alone    Co-evaluation               AM-PAC PT "6 Clicks" Mobility  Outcome Measure Help needed turning from your back to your side while in a flat bed without using bedrails?: None Help needed moving from lying on your back to sitting on the side of a flat bed without using bedrails?: None Help needed moving to and from a bed to a chair (including a wheelchair)?: A Little Help needed standing up from a chair using your arms (e.g., wheelchair or bedside chair)?: A Little Help needed to walk in hospital room?: A Little Help needed climbing 3-5 steps with a railing? : A Little 6 Click Score: 20    End of Session   Activity Tolerance: Patient tolerated treatment well Patient left: in chair;with call bell/phone within reach Nurse Communication: Mobility status PT Visit Diagnosis: Unsteadiness on feet (R26.81);Muscle weakness (generalized) (M62.81)    Time: 2841-3244 PT Time Calculation (min) (ACUTE ONLY): 27 min   Charges:   PT Evaluation $PT Eval Moderate Complexity: 1 Mod PT Treatments $Gait Training: 8-22 mins        [place] Pager [phone] Office [place] 01/22/2019, 12:42 PM

## 2019-01-22 NOTE — Progress Notes (Signed)
[facility] PROGRESS NOTE  Bianca [name] HKV:[id number] DOB: [birth year] DOA: 01/21/2019 PCP: Bianca Passy, MD  P Syncope, altered mental status Secondary to A. fib most likely-patient will need management of the same Will need follow-up orthostatic vital signs up with therapy to ensure safety Also would recommend discussion regarding medications on follow-up with PCP have discontinued losartan She is completely oriented and able to drink liquids-does not need a speech eval New onset A. fib RVR chads score >3-now on Eliquis Elevated troponin- Defer to cardiology work-up of troponin-may be rate related-might require cath versus stress test later this admission? Patient is a smoker so question whether she has some element of cor pulmonale which may be substrate for A. 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, mild metabolic acidosis with contraction alkalosis Patient had acute kidney injury on admission which is resolved with IV fluid Continue the same overnight repeat labs Distant history of intraductal carcinoma status post mastectomy 2014 Interval follow-up with PCP- BMI 18 Will need supplements and discussion about the same as outpatient Independent risk factor for mortality Sensorineural hearing loss Astigmatism COPD continued smoker Continue inhalers-no wheeze-unlikely will quit   --- Synopsis 78 year old female, COPD stage D + continued tobacco, HTN, arthritis, Chronic kidney disease followed by nephrology Sensorineural hearing loss Astigmatism BMI of 18 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 fib RVR no fever CT head negative for anything intracranially Discussed in detail with the daughter on 7/5 and patient has not had any falls or any history of A. fib   Cardiology consulted and Eliquis started Echo pending    DVT Eliquis Code Status: Full Communication: Long discussion with daughter [name] on telephone this morning understands clearly plan of care Disposition Plan: Therapy to see the patient for safety-defer to cardiology planning with regards to stress testing versus cath   Bianca Au, MD  [facility] [address] [place] [email or web address] 7PM-7AM contact night coverage as above 01/22/2019, 8:06 AM  LOS: 1 day  ---  Consultants:  Cardiology  Procedures:  Echo pending  Antimicrobials:  None --- Today Awake coherent pleasant no distress no chest pain quite hard of hearing Feels better than she did Tells me that she has had episodes over the past couple of years where she gets palpitations and felt she had this episode yesterday She does not have chest pain No cough no cold No fever  O  Vitals:  Vitals:   01/22/19 0446 01/22/19 0753  BP: (!) 149/72 (!) 143/77  Pulse: 79 82  Resp: 14 15  Temp:  98 F (36.7 C)  SpO2: 95% 95%    Exam:  Pleasant, asthenic, bitemporal wasting, no icterus no pallor, No submandibular thyromegaly Mallampati 2 Chest is clear without wheeze no fremitus no resonance Abdomen is soft No lower extremity edema Range of motion intact no focal deficit extraocular movements are intact    I have personally reviewed the following:   DATA BUN/creatinine down from 28/2.0-->26/1.5, bicarb is 21 proBNP 353 LDL 68 HDL 45 Hemoglobin down from 11.3-10.2 platelets normal  WBC normal   Scheduled Meds: . albuterol  2 puff Inhalation STAT  . diltiazem  30 mg Oral Q6H  . ipratropium-albuterol  3 mL  Nebulization BID  . mometasone-formoterol  2 puff Inhalation BID  . sodium chloride flush  3 mL Intravenous Q12H   Continuous Infusions:  Principal Problem:   Atrial fibrillation with RVR ([facility]) Active Problems:   Depression   HYPERTENSION, BENIGN ESSENTIAL   Asthma, chronic, unspecified asthma severity, with acute exacerbation   CHRONIC KIDNEY DISEASE STAGE III (MODERATE)   COPD (chronic obstructive pulmonary disease) ([facility]CC)   Tobacco abuse   LOS: 1 day

## 2019-01-22 NOTE — Progress Notes (Signed)
  Echocardiogram 2D Echocardiogram has been performed.  Bobbye Charleston 01/22/2019, 2:29 PM

## 2019-01-22 NOTE — Progress Notes (Signed)
Progress Note  Patient Name: Bianca [name] Date of Encounter: 01/22/2019  Primary Cardiologist: No primary care provider on file.   Subjective   "I feel better."  Inpatient Medications    Scheduled Meds: . albuterol  2 puff Inhalation STAT  . busPIRone  15 mg Oral BID  . diltiazem  30 mg Oral Q6H  . ipratropium-albuterol  3 mL Nebulization BID  . mirtazapine  15 mg Oral QHS  . mometasone-formoterol  2 puff Inhalation BID  . mometasone-formoterol  2 puff Inhalation BID  . [START ON 01/23/2019] multivitamin with minerals  1 tablet Oral Q M,W,F  . sodium chloride flush  3 mL Intravenous Q12H  . [START ON 01/23/2019] vitamin C  500-1,000 mg Oral Q M,W,F   Continuous Infusions: . sodium chloride 75 mL/hr at 01/22/19 0700   PRN Meds: acetaminophen, albuterol, ondansetron (ZOFRAN) IV   Vital Signs    Vitals:   01/22/19 0015 01/22/19 0445 01/22/19 0446 01/22/19 0753  BP:   (!) 149/72 (!) 143/77  Pulse:   79 82  Resp:   14 15  Temp: 99 F (37.2 C) 99 F (37.2 C)  98 F (36.7 C)  TempSrc: Oral Oral  Oral  SpO2:   95% 95%  Weight:   51.3 kg   Height:        Intake/Output Summary (Last 24 hours) at 01/22/2019 1020 Last data filed at 01/22/2019 0700 Gross per 24 hour  Intake 1516.88 ml  Output -  Net 1516.88 ml   Filed Weights   01/21/19 1532 01/21/19 2046 01/22/19 0446  Weight: 49.9 kg 51.3 kg 51.3 kg    Telemetry    NSR - Personally Reviewed  ECG    none - Personally Reviewed  Physical Exam   GEN: No acute distress.   Neck: No JVD Cardiac: RRR Respiratory: no increased work of breathing GI: non-distended  MS: No edema; No deformity. Neuro:  Nonfocal  Psych: Normal affect   Labs    Chemistry Recent Labs  Lab 01/21/19 1537 01/22/19 0431  NA 140 142  K 4.6 3.8  CL 112* 114*  CO2 20* 21*  GLUCOSE 141* 90  BUN 28* 26*  CREATININE 2.03* 1.59*  CALCIUM 8.5* 8.1*  PROT 5.7*  --   ALBUMIN 3.2*  --   AST 24  --   ALT 16  --   ALKPHOS 53  --    BILITOT 0.7  --   GFRNONAA 23* 31*  GFRAA 26* 35*  ANIONGAP 8 7     Hematology Recent Labs  Lab 01/21/19 1537 01/22/19 0431  WBC 10.0 9.4  RBC 3.83* 3.47*  HGB 11.3* 10.2*  HCT 35.4* 30.8*  MCV 92.4 88.8  MCH 29.5 29.4  MCHC 31.9 33.1  RDW 13.8 13.8  PLT 284 265    Cardiac EnzymesNo results for input(s): TROPONINI in the last 168 hours. No results for input(s): TROPIPOC in the last 168 hours.   BNP Recent Labs  Lab 01/21/19 1902  BNP 353.6*     DDimer No results for input(s): DDIMER in the last 168 hours.   Radiology    Ct Head Wo Contrast  Result Date: 01/21/2019 CLINICAL DATA:  Acute mental status change. EXAM: CT HEAD WITHOUT CONTRAST TECHNIQUE: Contiguous axial images were obtained from the base of the skull through the vertex without intravenous contrast. COMPARISON:  None. FINDINGS: Brain: No subdural, epidural, or subarachnoid hemorrhage. Ventricles and sulci are prominent. The cerebellum, brainstem, and basal cisterns  are normal. No mass effect or midline shift. Moderate white matter changes are noted. No acute cortical ischemia or infarct identified. Vascular: No hyperdense vessel or unexpected calcification. Skull: Normal. Negative for fracture or focal lesion. Sinuses/Orbits: No acute finding. Other: None. IMPRESSION: No acute intracranial abnormalities.  Chronic white matter changes. Electronically Signed   By: Dorise Bullion III M.D   On: 01/21/2019 17:21   Dg Chest Portable 1 View  Result Date: 01/21/2019 CLINICAL DATA:  Atrial fibrillation EXAM: PORTABLE CHEST 1 VIEW COMPARISON:  None. FINDINGS: The heart size and mediastinal contours are within normal limits. Both lungs are clear. The visualized skeletal structures are unremarkable. IMPRESSION: No active disease. Electronically Signed   By: Dorise Bullion III M.D   On: 01/21/2019 16:46    Cardiac Studies   2D echo is pending  Patient Profile     78 y.o. female admitted with atrial fib with a RVR and  hypotension.  Assessment & Plan    1. Atrial fib with a RVR - she has reverted back to NSR. She will need systemic anti-coagulation. Either Xarelto 15 mg daily or Eliquis 2.5 mg twice daily. She will need cardizem 180 mg daily. followup in atrial fib clinic. 2. Acute renal insufficiency stage 3 - her creatinine has improved with IV fluids and return to NSR.  3. Disp. - she can be discharged home today from my perspective with followup in atrial fib clinic.     For questions or updates, please contact [place] Please consult [email or web address] for contact info under Cardiology/STEMI.      Signed, Cristopher Peru, MD  01/22/2019, 10:20 AM  Patient ID: Bianca [name], female   DOB: 25-Apr-1941, 78 y.o.   MRN: [id number]

## 2019-01-22 NOTE — Progress Notes (Signed)
Received pt from ED on NSR with HR=80's; on Cardizem gtt @5  ml/hr. EKG done to confirm. Dr Ulysees Barns informed. Cardizem 30 mg po started. Will discontinue Cardizem gtt after 2 hrs as ordered.

## 2019-01-22 NOTE — Evaluation (Signed)
Occupational Therapy Evaluation Patient Details Name: Bianca [name] MRN: [id number] DOB: 1941/04/24 Today's Date: 01/22/2019    History of Present Illness Pt adm after syncope at home. Found to have afib with rvr with orthostatic hypotension. PMH - copd,asthma, anxiety, htn,arthritis, breast CA   Clinical Impression   Pt PTA: Living alone with supportive family checking in. Pt reports independence with ADL and mobility. Pt currently limited by generalized weakness. Pt set-upA to supervisionA for UB ADL in sitting and UB/LB ADL in standing with minguardA for about 6 mins with fair balance. Pt mobilizing with Rw and minguardA. Pt denying dizziness. BP to conclude session: 125/76. >90% on RA O2 and HR <80 BPM with activity. Pt would benefit from continued OT skilled services for  Higher level cognitive skills, ADL,mobility and safety. OT following.    Follow Up Recommendations  Home health OT;Supervision/Assistance - 24 hour    Equipment Recommendations       Recommendations for Other Services       Precautions / Restrictions Precautions Precautions: Fall Restrictions Weight Bearing Restrictions: No      Mobility Bed Mobility Overal bed mobility: Modified Independent             General bed mobility comments: Increased time  Transfers Overall transfer level: Needs assistance Equipment used: None Transfers: Sit to/from Stand Sit to Stand: Supervision         General transfer comment: increased time to rise    Balance Overall balance assessment: Needs assistance Sitting-balance support: No upper extremity supported;Feet supported Sitting balance-Leahy Scale: Good     Standing balance support: No upper extremity supported;During functional activity Standing balance-Leahy Scale: Fair                             ADL either performed or assessed with clinical judgement   ADL Overall ADL's : At baseline                                        General ADL Comments: Pt supervision level for ADL. Pt stood for toilet hygiene and grooming at sink ~6 mins total with fair balance. Pt supervisionA level.     Vision Baseline Vision/History: Wears glasses Wears Glasses: At all times Vision Assessment?: No apparent visual deficits     Perception     Praxis      Pertinent Vitals/Pain Pain Assessment: No/denies pain     Hand Dominance Right   Extremity/Trunk Assessment Upper Extremity Assessment Upper Extremity Assessment: Generalized weakness   Lower Extremity Assessment Lower Extremity Assessment: Generalized weakness   Cervical / Trunk Assessment Cervical / Trunk Assessment: Kyphotic   Communication Communication Communication: HOH   Cognition Arousal/Alertness: Awake/alert Behavior During Therapy: WFL for tasks assessed/performed Overall Cognitive Status: Within Functional Limits for tasks assessed                                 General Comments: Pt reports that she attended a consult by an MD about hearing aids, but has not been able to follow-up due to [place].    General Comments  125/76 s/p exertion seated.    Exercises     Shoulder Instructions      Home Living Family/patient expects to be discharged to:: Private residence Living Arrangements: Alone Available Help at  Discharge: Family;Available PRN/intermittently Type of Home: House Home Access: Stairs to enter [employer] of Steps: 1   Home Layout: One level     Bathroom Shower/Tub: Occupational psychologist: Handicapped height     Home Equipment: Environmental consultant - 4 wheels;Bedside commode          Prior Functioning/Environment Level of Independence: Independent        Comments: drives        OT Problem List: Decreased strength;Decreased activity tolerance;Impaired balance (sitting and/or standing);Decreased safety awareness      OT Treatment/Interventions: Self-care/ADL training;Therapeutic  exercise;Neuromuscular education;Energy conservation;Therapeutic activities;Balance training    OT Goals(Current goals can be found in the care plan section) Acute Rehab OT Goals Patient Stated Goal: return home OT Goal Formulation: With patient Time For Goal Achievement: 02/05/19 Potential to Achieve Goals: Good ADL Goals Pt Will Perform Grooming: with modified independence;standing Pt Will Perform Toileting - Clothing Manipulation and hygiene: with modified independence;sit to/from stand Additional ADL Goal #1: Pt will be modified independent with ADL in standing x5 mins with fair balance  OT Frequency: Min 2X/week   Barriers to D/C:            Co-evaluation              AM-PAC OT "6 Clicks" Daily Activity     Outcome Measure Help from another person eating meals?: None Help from another person taking care of personal grooming?: A Little Help from another person toileting, which includes using toliet, bedpan, or urinal?: A Little Help from another person bathing (including washing, rinsing, drying)?: A Little Help from another person to put on and taking off regular upper body clothing?: None Help from another person to put on and taking off regular lower body clothing?: A Little 6 Click Score: 20   End of Session Equipment Utilized During Treatment: Gait belt;Rolling walker Nurse Communication: Mobility status  Activity Tolerance: Patient tolerated treatment well Patient left: in chair;with call bell/phone within reach;with chair alarm set  OT Visit Diagnosis: Unsteadiness on feet (R26.81);Muscle weakness (generalized) (M62.81)                Time: 9458-5929 OT Time Calculation (min): 38 min Charges:  OT General Charges $OT Visit: 1 Visit OT Evaluation $OT Eval Moderate Complexity: 1 Mod OT Treatments $Self Care/Home Management : 23-37 mins  Ebony Hail Harold Hedge) Marsa Aris OTR/L Acute Rehabilitation Services Pager: [phone] Office: [place] 01/22/2019, 3:13 PM

## 2019-01-22 NOTE — Progress Notes (Signed)
SLP Cancellation Note  Patient Details Name: Bianca [name] MRN: [id number] DOB: 1940/08/11   Cancelled treatment:       Reason Eval/Treat Not Completed: Other (comment) Order discontinued by MD verbally to SLP   [facility], Katherene Ponto 01/22/2019, 8:46 AM

## 2019-01-23 ENCOUNTER — Inpatient Hospital Stay (HOSPITAL_COMMUNITY): Payer: PPO

## 2019-01-23 DIAGNOSIS — I10 Essential (primary) hypertension: Secondary | ICD-10-CM

## 2019-01-23 DIAGNOSIS — N183 Chronic kidney disease, stage 3 (moderate): Secondary | ICD-10-CM

## 2019-01-23 DIAGNOSIS — R410 Disorientation, unspecified: Secondary | ICD-10-CM

## 2019-01-23 DIAGNOSIS — R7989 Other specified abnormal findings of blood chemistry: Secondary | ICD-10-CM

## 2019-01-23 DIAGNOSIS — R778 Other specified abnormalities of plasma proteins: Secondary | ICD-10-CM

## 2019-01-23 LAB — BASIC METABOLIC PANEL
Anion gap: 12 (ref 5–15)
BUN: 20 mg/dL (ref 8–23)
CO2: 19 mmol/L — ABNORMAL LOW (ref 22–32)
Calcium: 8.4 mg/dL — ABNORMAL LOW (ref 8.9–10.3)
Chloride: 115 mmol/L — ABNORMAL HIGH (ref 98–111)
Creatinine, Ser: 1.45 mg/dL — ABNORMAL HIGH (ref 0.44–1.00)
GFR calc Af Amer: 40 mL/min — ABNORMAL LOW (ref >60)
GFR calc non Af Amer: 34 mL/min — ABNORMAL LOW (ref >60)
Glucose, Bld: 115 mg/dL — ABNORMAL HIGH (ref 70–99)
Potassium: 3.7 mmol/L (ref 3.5–5.1)
Sodium: 146 mmol/L — ABNORMAL HIGH (ref 135–145)

## 2019-01-23 LAB — CBC WITH DIFFERENTIAL/PLATELET
Abs Immature Granulocytes: 0.04 10*3/uL (ref 0.00–0.07)
Basophils Absolute: 0 10*3/uL (ref 0.0–0.1)
Basophils Relative: 0 %
Eosinophils Absolute: 0 10*3/uL (ref 0.0–0.5)
Eosinophils Relative: 0 %
HCT: 34.1 % — ABNORMAL LOW (ref 36.0–46.0)
Hemoglobin: 11.3 g/dL — ABNORMAL LOW (ref 12.0–15.0)
Immature Granulocytes: 0 %
Lymphocytes Relative: 11 %
Lymphs Abs: 1.4 10*3/uL (ref 0.7–4.0)
MCH: 30.2 pg (ref 26.0–34.0)
MCHC: 33.1 g/dL (ref 30.0–36.0)
MCV: 91.2 fL (ref 80.0–100.0)
Monocytes Absolute: 1.3 10*3/uL — ABNORMAL HIGH (ref 0.1–1.0)
Monocytes Relative: 10 %
Neutro Abs: 10.4 10*3/uL — ABNORMAL HIGH (ref 1.7–7.7)
Neutrophils Relative %: 79 %
Platelets: 275 10*3/uL (ref 150–400)
RBC: 3.74 MIL/uL — ABNORMAL LOW (ref 3.87–5.11)
RDW: 14 % (ref 11.5–15.5)
WBC: 13.2 10*3/uL — ABNORMAL HIGH (ref 4.0–10.5)
nRBC: 0 % (ref 0.0–0.2)

## 2019-01-23 LAB — URINE CULTURE: Culture: <10000 — AB

## 2019-01-23 LAB — PROCALCITONIN: Procalcitonin: <0.1 ng/mL

## 2019-01-23 MED ORDER — NICOTINE 14 MG/24HR TD PT24
14.0000 mg | MEDICATED_PATCH | Freq: Every day | TRANSDERMAL | Status: DC
  Administered 2019-01-23 – 2019-01-25 (×3): 14 mg via TRANSDERMAL
  Filled 2019-01-23 (×3): qty 1

## 2019-01-23 MED ORDER — LEVOFLOXACIN IN D5W 500 MG/100ML IV SOLN
500.0000 mg | INTRAVENOUS | Status: DC
  Filled 2019-01-23: qty 100

## 2019-01-23 MED ORDER — ENSURE ENLIVE PO LIQD
237.0000 mL | Freq: Two times a day (BID) | ORAL | Status: DC
  Administered 2019-01-23 – 2019-01-25 (×3): 237 mL via ORAL

## 2019-01-23 MED ORDER — LEVOFLOXACIN IN D5W 500 MG/100ML IV SOLN
500.0000 mg | INTRAVENOUS | Status: DC
  Administered 2019-01-23: 500 mg via INTRAVENOUS
  Filled 2019-01-23: qty 100

## 2019-01-23 MED ORDER — IPRATROPIUM-ALBUTEROL 0.5-2.5 (3) MG/3ML IN SOLN
3.0000 mL | Freq: Once | RESPIRATORY_TRACT | Status: AC
  Administered 2019-01-23: 3 mL via RESPIRATORY_TRACT
  Filled 2019-01-23: qty 3

## 2019-01-23 MED ORDER — ALPRAZOLAM 0.25 MG PO TABS
0.2500 mg | ORAL_TABLET | Freq: Once | ORAL | Status: AC
  Administered 2019-01-23: 0.25 mg via ORAL
  Filled 2019-01-23: qty 1

## 2019-01-23 MED ORDER — DILTIAZEM HCL ER COATED BEADS 180 MG PO CP24
180.0000 mg | ORAL_CAPSULE | Freq: Every day | ORAL | Status: DC
  Administered 2019-01-23 – 2019-01-25 (×3): 180 mg via ORAL
  Filled 2019-01-23 (×3): qty 1

## 2019-01-23 NOTE — Progress Notes (Signed)
Initial Nutrition Assessment  DOCUMENTATION CODES:   Not applicable  INTERVENTION:  Ensure Enlive po BID, each supplement provides 350 kcal and 20 grams of protein Magic cup TID with meals, each supplement provides 290 kcal and 9 grams of protein MVI   NUTRITION DIAGNOSIS:   Increased nutrient needs related to acute illness as evidenced by estimated needs.   GOAL:   Patient will meet greater than or equal to 90% of their needs   MONITOR:   PO intake, Labs, Supplement acceptance, Weight trends, I & O's  REASON FOR ASSESSMENT:   Consult Assessment of nutrition requirement/status  ASSESSMENT:  78 year old female with medical history significant for CKD3, COPD on daily inhalers, tobacco use, asthma, anxiety, HTN, arthritis presented to ED with episode of syncope at home. Patient admitted with A-fib with RVR   Sitter and additional discipline in room with patient at time of visit. RD was asked to come back at a later time and informed that the doctor was wanting patient to try and rest for a while. Per chart review, patient having some confusion and reported to have been upset with attempts to go home.   RD will continue to monitor PO intake and provide Ensure in between meals. Currently patient documented with 100% intake  Weights reviewed; stable  NUTRITION - FOCUSED PHYSICAL EXAM:  Unable to access at this time  Diet Order:   Diet Order            Diet Heart Room service appropriate? Yes; Fluid consistency: Thin  Diet effective now              EDUCATION NEEDS:   Not appropriate for education at this time  Skin:  Skin Assessment: Reviewed RN Assessment  Last BM:  7/5  Height:   Ht Readings from Last 1 Encounters:  01/21/19 5\' 5"  (1.651 m)    Weight:   Wt Readings from Last 1 Encounters:  01/23/19 51.6 kg    Ideal Body Weight:  56.8 kg  BMI:  Body mass index is 18.92 kg/m.  Estimated Nutritional Needs:   Kcal:  1300-1500  Protein:   61-77g  Fluid:  2.5L    Lajuan Lines, RD, LDN  After Hours/Weekend Pager: [phone]

## 2019-01-23 NOTE — Progress Notes (Signed)
CXR done today per my ver-read  shows possible L area of atelectasis + PNa compared to prior film 7/4 given constellation of CXR findings, ^ wbc, reasonable to cover for PNA c Levoquin Watch t curve--get cult if Tmax > 100.5  Verneita Griffes, MD [facility] 5:19 PM

## 2019-01-23 NOTE — TOC Initial Note (Addendum)
Transition of Care [facility]) - Initial/Assessment Note    Patient Details  Name: Bianca [name] MRN: [id number] Date of Birth: Apr 10, 1941  [facility]) CM/SW Contact:    Eileen Stanford, LCSW Phone Number: 01/23/2019, 3:23 PM  Clinical Narrative:  CSW spoke with pt's daughter via telephone. Pt's daughter wanted [facility] however they were unable to take pt due to insurance. Pt's daughter is agreeable to [facility], whom can take pt. Pt lives alone. Pt has a potty char and walker at home. Pt uses [facility] in [place]. Pt has a PCP, Dr. Marjory [facility]     Pt is set up with [place] [facility] with [facility].         Expected Discharge Plan: [place] Barriers to Discharge: Continued Medical Work up   Patient Goals and CMS Choice Patient states their goals for this hospitalization and ongoing recovery are:: To get mom better CMS Medicare.gov Compare Post Acute Care list provided to:: (Pt's daughter didn't need list) Choice offered to / list presented to : Adult Children  Expected Discharge Plan and Services Expected Discharge Plan: [place] In-house Referral: NA   Post Acute Care Choice: [place] arrangements for the past 2 months: [place]: PT, OT HH Agency: [facility] Date [place]: 01/23/19 Time [place]: 55 Representative spoke with at [place]: Dorian Pod  Prior Living Arrangements/Services Living arrangements for the past 2 months: [place] with:: Self Patient language and need for interpreter reviewed:: Yes Do you feel safe going back to the place where you live?: Yes      Need for Family Participation in Patient Care: Yes (Comment) Care giver support system in place?: Yes (comment)   Criminal Activity/Legal Involvement Pertinent to Current Situation/Hospitalization: No - Comment as needed  Activities of Daily  Living Home Assistive Devices/Equipment: None ADL Screening (condition at time of admission) Patient's cognitive ability adequate to safely complete daily activities?: Yes Is the patient deaf or have difficulty hearing?: No Does the patient have difficulty seeing, even when wearing glasses/contacts?: No Does the patient have difficulty concentrating, remembering, or making decisions?: No Patient able to express need for assistance with ADLs?: Yes Does the patient have difficulty dressing or bathing?: No Independently performs ADLs?: Yes (appropriate for developmental age) Does the patient have difficulty walking or climbing stairs?: No Weakness of Legs: None Weakness of Arms/Hands: None  Permission Sought/Granted Permission sought to share information with : Family Supports, Pharmacist, community Information with NAME: Scientist, clinical (histocompatibility and immunogenetics) granted to share info w AGENCY: [facility]  Permission granted to share info w Relationship: Daughter     Emotional Assessment Appearance:: Appears stated age Attitude/Demeanor/Rapport: Unable to Assess Affect (typically observed): Unable to Assess Orientation: : Oriented to Self, Oriented to  Time Alcohol / Substance Use: Not Applicable Psych Involvement: No (comment)  Admission diagnosis:  Elevated troponin [R79.89] Atrial fibrillation with RVR ([place]) [I48.91] Patient Active Problem List   Diagnosis Date Noted  . Elevated troponin   . Delirium   . Atrial fibrillation with RVR ([place]) 01/21/2019  . Underweight due to inadequate caloric intake 12/06/2017  . Acute bronchitis with COPD ([place]) 03/29/2017  . Osteoporosis 07/02/2016  . Tobacco abuse 10/26/2013  .  Breast cancer of upper-outer quadrant of left female breast ([place]) 03/06/2013  . COPD (chronic obstructive pulmonary disease) ([place]) 06/09/2012  . Adjustment disorder 11/25/2010  . CHRONIC KIDNEY DISEASE STAGE III (MODERATE) 11/13/2009  . Depression 05/04/2007  . Asthma,  chronic, unspecified asthma severity, with acute exacerbation 05/03/2007  . HYPERTENSION, BENIGN ESSENTIAL 12/06/2006  . ALLERGIC  RHINITIS 12/06/2006   PCP:  Lucille Passy, MD Pharmacy:   [place], [place] - [place] [address] Phone: [phone] Fax: [phone]     Social Determinants of Health (SDOH) Interventions    Readmission Risk Interventions No flowsheet data found.

## 2019-01-23 NOTE — Progress Notes (Signed)
Progress Note  Patient Name: Bianca [name] Date of Encounter: 01/23/2019  Primary Cardiologist: Cristopher Peru, MD - new  Subjective   Pt confused this morning, sitter at bedside. Telemetry with artifact, but appears to be sinus tachcyardia with HR 100s-120s this morning  Inpatient Medications    Scheduled Meds: . busPIRone  15 mg Oral BID  . diltiazem  180 mg Oral Daily  . ipratropium-albuterol  3 mL Nebulization BID  . mirtazapine  15 mg Oral QHS  . mometasone-formoterol  2 puff Inhalation BID  . multivitamin with minerals  1 tablet Oral Q M,W,F  . nicotine  14 mg Transdermal Daily  . sodium chloride flush  3 mL Intravenous Q12H  . vitamin C  500-1,000 mg Oral Q M,W,F   Continuous Infusions: . sodium chloride Stopped (01/23/19 0235)   PRN Meds: acetaminophen, albuterol, ondansetron (ZOFRAN) IV   Vital Signs    Vitals:   01/22/19 2014 01/23/19 0352 01/23/19 0804 01/23/19 0805  BP: (!) 154/95 (!) 150/72    Pulse: 90 (!) 102    Resp: 19 20    Temp: 97.7 F (36.5 C) 98.8 F (37.1 C)    TempSrc: Oral Oral    SpO2: 98% 94% 96% 96%  Weight:  51.6 kg    Height:        Intake/Output Summary (Last 24 hours) at 01/23/2019 3810 Last data filed at 01/23/2019 0500 Gross per 24 hour  Intake 1637.39 ml  Output 1450 ml  Net 187.39 ml   Last 3 Weights 01/23/2019 01/22/2019 01/21/2019  Weight (lbs) 113 lb 11.2 oz 113 lb 1.5 oz 113 lb 1.5 oz  Weight (kg) 51.574 kg 51.3 kg 51.3 kg      Telemetry    Sinus tachycardia 100-120s, artifact - Personally Reviewed  ECG    No new tracings - Personally Reviewed  Physical Exam   GEN: elderly female, confused, trying to leave room, no distress   Neck: No JVD Cardiac: regular rhythm, tachycardic rate Respiratory: Clear to auscultation bilaterally. GI: Soft, nontender, non-distended  MS: No edema; No deformity. Neuro:  Nonfocal  Psych: Normal affect   Labs    High Sensitivity Troponin:   Recent Labs  Lab 01/21/19 1537  01/21/19 1902  TROPONINIHS 196* 1,838*      Cardiac EnzymesNo results for input(s): TROPONINI in the last 168 hours. No results for input(s): TROPIPOC in the last 168 hours.   Chemistry Recent Labs  Lab 01/21/19 1537 01/22/19 0431 01/23/19 0543  NA 140 142 146*  K 4.6 3.8 3.7  CL 112* 114* 115*  CO2 20* 21* 19*  GLUCOSE 141* 90 115*  BUN 28* 26* 20  CREATININE 2.03* 1.59* 1.45*  CALCIUM 8.5* 8.1* 8.4*  PROT 5.7*  --   --   ALBUMIN 3.2*  --   --   AST 24  --   --   ALT 16  --   --   ALKPHOS 53  --   --   BILITOT 0.7  --   --   GFRNONAA 23* 31* 34*  GFRAA 26* 35* 40*  ANIONGAP 8 7 12      Hematology Recent Labs  Lab 01/21/19 1537 01/22/19 0431 01/23/19 0543  WBC 10.0 9.4 13.2*  RBC 3.83* 3.47* 3.74*  HGB 11.3* 10.2* 11.3*  HCT 35.4* 30.8* 34.1*  MCV 92.4 88.8 91.2  MCH 29.5 29.4 30.2  MCHC 31.9 33.1 33.1  RDW 13.8 13.8 14.0  PLT 284 265 275  BNP Recent Labs  Lab 01/21/19 1902  BNP 353.6*     DDimer No results for input(s): DDIMER in the last 168 hours.   Radiology    Ct Head Wo Contrast  Result Date: 01/21/2019 CLINICAL DATA:  Acute mental status change. EXAM: CT HEAD WITHOUT CONTRAST TECHNIQUE: Contiguous axial images were obtained from the base of the skull through the vertex without intravenous contrast. COMPARISON:  None. FINDINGS: Brain: No subdural, epidural, or subarachnoid hemorrhage. Ventricles and sulci are prominent. The cerebellum, brainstem, and basal cisterns are normal. No mass effect or midline shift. Moderate white matter changes are noted. No acute cortical ischemia or infarct identified. Vascular: No hyperdense vessel or unexpected calcification. Skull: Normal. Negative for fracture or focal lesion. Sinuses/Orbits: No acute finding. Other: None. IMPRESSION: No acute intracranial abnormalities.  Chronic white matter changes. Electronically Signed   By: Dorise Bullion III M.D   On: 01/21/2019 17:21   Dg Chest Portable 1 View  Result  Date: 01/21/2019 CLINICAL DATA:  Atrial fibrillation EXAM: PORTABLE CHEST 1 VIEW COMPARISON:  None. FINDINGS: The heart size and mediastinal contours are within normal limits. Both lungs are clear. The visualized skeletal structures are unremarkable. IMPRESSION: No active disease. Electronically Signed   By: Dorise Bullion III M.D   On: 01/21/2019 16:46    Cardiac Studies   Echo 01/22/19:   1. The left ventricle has normal systolic function with an ejection fraction of 60-65%. The cavity size was normal. There is moderate concentric left ventricular hypertrophy. Left ventricular diastolic Doppler parameters are consistent with  pseudonormalization. Elevated left atrial and left ventricular end-diastolic pressures.  2. The right ventricle has normal systolic function. The cavity was normal. There is no increase in right ventricular wall thickness. Right ventricular systolic pressure is moderately elevated.  3. Mitral valve regurgitation is moderate by color flow Doppler.  4. The aortic valve is tricuspid. Moderate thickening of the aortic valve. Moderate calcification of the aortic valve. Aortic valve regurgitation was not assessed by color flow Doppler.  5. The inferior vena cava was dilated in size with <50% respiratory variability.  Patient Profile     78 y.o. female with CKD stage III, COPD, tobacco use, and anxiety seen for atrial fibrillation and hypotension. Converted to NSR (01/21/19), now sinus tachycardia in the 100-120s  Assessment & Plan    1. Atrial fibrillation - new diagnosis, presumed paroxysmal -  Converted to NSR on cardizem drip at the time of initial consultation - cardizem transitioned to PO dosing 180 mg - telemetry with sinus tachycardia in the 100-120s - pressure has improved, would increase cardizem for better rate control - This patients CHA2DS2-VASc Score and unadjusted Ischemic Stroke Rate (% per year) is at least 4.8 % stroke rate/year from a score of 4 (HTN, age,  female, +/- CHF)) - recommend anticoagulation per EP - 2.5 mg eliquis BID vs 15 mg xarelto (see Dr. Tanna Furry note 01/22/19)    2. Hypotension - resolved - pressures improved and now hypertensive - increase cardizem as above - creatinine near baseline, may restart home losartan as pressure tolerates after increasing cardizem   3. Chronic diastolic heart failure 4. Moderate mitral valve regurgitation - Echo with diastolic dysfunction, elevated LVEDP and moderately elevated right heart pressures, dilated IVC - diuresed with    5. CKD stage III - sCr improved to 1.45 - baseline appears to be 1.2-1.6 - K 3.7   6. Confused - unclear etiology of confusion - sitter at bedside  Will arrange follow up in Afib clinic.     For questions or updates, please contact [place] Please consult [email or web address] for contact info under        Signed, Ledora Bottcher, PA  01/23/2019, 9:07 AM

## 2019-01-23 NOTE — Progress Notes (Signed)
Pt is very confused, upset and wants to go home. Pt is also wheezing. Dr. Silas [place] informed. Safety sitter ordered. Breathing treatment & xanax po given as ordered. Will continue to watch patient.

## 2019-01-23 NOTE — Progress Notes (Signed)
Daughter [name] phoned and expressed concerns that her mother might have been recently exposed to black mold in her home during some renovations.

## 2019-01-23 NOTE — Evaluation (Addendum)
Physical Therapy Treatment Patient Details Name: Bianca [name] MRN: [id number] DOB: Jul 07, 1941 Today's Date: 01/23/2019    History of Present Illness Pt adm after syncope at home. Found to have afib with rvr with orthostatic hypotension. PMH - copd,asthma, anxiety, htn,arthritis, breast CA    PT Comments    Pt with safety sitter in room due to confusion about situation, and increased unsafe mobility in room. Pt with HR monitor off, pt able to sit still for leads to be reattached, constantly on phone but swiping through screens and not doing anything functional. Pt will not give phone to sitter for ambulation in hallway and attempts to use in while walking, causing misstep from which she is able to self recover. Pt mobility is currently min guard, however PT recommending 24 hour assist at discharge due to pt confusion and mild instability. PT will continue to follow acutely.   Follow Up Recommendations  Home health PT;Supervision/Assistance - 24 hour     Equipment Recommendations  None recommended by PT       Precautions / Restrictions Precautions Precautions: Fall Restrictions Weight Bearing Restrictions: No    Mobility  Bed Mobility               General bed mobility comments: OOB standing with safety sitter in room   Transfers Overall transfer level: Needs assistance Equipment used: None Transfers: Sit to/from Stand Sit to Stand: Supervision         General transfer comment: incr time to rise, decreased safety with uncontrolled descent despite cuing for use of handrests  Ambulation/Gait Ambulation/Gait assistance: Min guard Gait Distance (Feet): 400 Feet Assistive device: None Gait Pattern/deviations: Step-through pattern;Decreased stride length Gait velocity: decr Gait velocity interpretation: 1.31 - 2.62 ft/sec, indicative of limited community ambulator General Gait Details: min guard for safety, mildly unsteady with gait, drifting in hallway, pt reports  "these shoes are not good for walking."       Balance Overall balance assessment: Needs assistance Sitting-balance support: No upper extremity supported;Feet supported Sitting balance-Leahy Scale: Good     Standing balance support: No upper extremity supported;During functional activity Standing balance-Leahy Scale: Fair                              Cognition Arousal/Alertness: Awake/alert Behavior During Therapy: WFL for tasks assessed/performed Overall Cognitive Status: Impaired/Different from baseline Area of Impairment: Orientation;Safety/judgement;Awareness;Problem solving;Following commands;Memory                 Orientation Level: Disoriented to;Time;Situation   Memory: Decreased short-term memory Following Commands: Follows multi-step commands inconsistently Safety/Judgement: Decreased awareness of safety;Decreased awareness of deficits Awareness: Emergent Problem Solving: Slow processing;Difficulty sequencing;Requires verbal cues;Requires tactile cues General Comments: pt with sitter in room, pt keeps asking when her daughter will be here. Requires increased verbal cues for tasks HoH vs slow processing,       Exercises Total Joint Exercises Knee Flexion: AROM;Both;10 reps;Standing General Exercises - Lower Extremity Hip ABduction/ADduction: AROM;Both;10 reps;Standing Hip Flexion/Marching: AROM;Both;10 reps;Standing    General Comments General comments (skin integrity, edema, etc.): resting HR 113 bpm HR max with ambulation 123 bpm       Pertinent Vitals/Pain Pain Assessment: No/denies pain           PT Goals (current goals can now be found in the care plan section) Acute Rehab PT Goals Patient Stated Goal: return home PT Goal Formulation: With patient Time For Goal Achievement: 01/29/19 Potential to  Achieve Goals: Good Progress towards PT goals: Progressing toward goals    Frequency    Min 3X/week      PT Plan Discharge plan  needs to be updated       AM-PAC PT "6 Clicks" Mobility   Outcome Measure  Help needed turning from your back to your side while in a flat bed without using bedrails?: None Help needed moving from lying on your back to sitting on the side of a flat bed without using bedrails?: None Help needed moving to and from a bed to a chair (including a wheelchair)?: A Little Help needed standing up from a chair using your arms (e.g., wheelchair or bedside chair)?: A Little Help needed to walk in hospital room?: A Little Help needed climbing 3-5 steps with a railing? : A Little 6 Click Score: 20    End of Session Equipment Utilized During Treatment: Gait belt Activity Tolerance: Patient tolerated treatment well Patient left: in chair;with call bell/phone within reach;with nursing/sitter in room Nurse Communication: Mobility status PT Visit Diagnosis: Unsteadiness on feet (R26.81);Muscle weakness (generalized) (M62.81)     Time: 4496-7591 PT Time Calculation (min) (ACUTE ONLY): 19 min  Charges:  $Gait Training: 8-22 mins                     Mahdi Frye B. Migdalia Dk PT, DPT Acute Rehabilitation Services Pager [phone] Office [phone]    [place] 01/23/2019, 9:44 AM

## 2019-01-23 NOTE — Progress Notes (Signed)
[facility] PROGRESS NOTE  Bianca [name] ACZ:[id number] DOB: Oct 05, 1940 DOA: 01/21/2019 PCP: Lucille Passy, MD  P Syncope, altered mental status Secondary to rapid A. Fib on admit most likely-patient will need management of the same New onset A. fib RVR chads score >3-now on Eliquis Elevated troponin- Patient is a smoker so question whether she has some element of cor pulmonale which may be substrate for A. 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 Interval follow-up with PCP- BMI 18 Will need supplements and discussion about the same as outpatient Independent risk factor for mortality Sensorineural hearing loss Astigmatism COPD continued smoker Continue inhalers-no wheeze-unlikely will quit   --- Synopsis 78 year old female, COPD stage D + continued tobacco, HTN, arthritis, Chronic kidney disease followed by nephrology Sensorineural hearing loss Astigmatism BMI of 18 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 fib RVR no fever CT head negative for anything intracranially on admission  Cardiology consulted   More confused overnight 7/5    DVT Eliquis Code Status: Full Communication: Long discussion with daughter [name] on  telephone Disposition Plan: Not ready for discharge confused might have infection   [name], MD  [facility] Via [employer] app OR [email or web address] 7PM-7AM contact night coverage as above 01/23/2019, 9:24 AM  LOS: 2 days  ---  Consultants:  Cardiology  Procedures:  Echo pending  Antimicrobials:  None --- Today Confused overnight did not sleep at all last night Safety sitter requested Daughter saw the patient She received Ativan which made her more incoherent apparently She does not have any fevers chills she can tell me that she is in [place] but thinks that she is at [name], not in the hospital   O  Vitals:  Vitals:   01/23/19 0804 01/23/19 0805  BP:    Pulse:    Resp:    Temp:    SpO2: 96% 96%    Exam:  Awake confused pleasant No distress Chest bilaterally clear posterolaterally S1-S2 no murmur Abdomen soft No lower extremity edema     I have personally reviewed the following:   DATA White count 13 BUN/creatinine down from 28/2.0-->26/1.5-->20/1.4 , bicarb is 21-->19 Sodium up to 146 chloride up to 115 proBNP 353 LDL 68 HDL 45 Hemoglobin 11.3 WBC up to 13.2  IMPRESSIONS    1. The left ventricle has normal systolic function with an ejection fraction of 60-65%. The cavity size was normal. There is moderate concentric left ventricular hypertrophy. Left ventricular diastolic Doppler parameters are consistent with  pseudonormalization. Elevated left atrial and left ventricular end-diastolic pressures.  2. The right ventricle has normal systolic function. The cavity was normal. There is no increase in right ventricular wall thickness. Right ventricular systolic pressure is moderately elevated.  3. Mitral valve regurgitation is moderate by color flow Doppler.  4. The aortic valve  is tricuspid. Moderate thickening of the aortic valve. Moderate calcification of the aortic valve. Aortic valve regurgitation was not assessed by color flow  Doppler.  5. The inferior vena cava was dilated in size with <50% respiratory variability.   Scheduled Meds: . busPIRone  15 mg Oral BID  . diltiazem  180 mg Oral Daily  . ipratropium-albuterol  3 mL Nebulization BID  . mirtazapine  15 mg Oral QHS  . mometasone-formoterol  2 puff Inhalation BID  . multivitamin with minerals  1 tablet Oral Q M,W,F  . nicotine  14 mg Transdermal Daily  . sodium chloride flush  3 mL Intravenous Q12H  . vitamin C  500-1,000 mg Oral Q M,W,F   Continuous Infusions: . sodium chloride Stopped (01/23/19 0235)    Principal Problem:   Atrial fibrillation with RVR (HCC) Active Problems:   Depression   HYPERTENSION, BENIGN ESSENTIAL   Asthma, chronic, unspecified asthma severity, with acute exacerbation   CHRONIC KIDNEY DISEASE STAGE III (MODERATE)   COPD (chronic obstructive pulmonary disease) (HCC)   Tobacco abuse   Elevated troponin   Delirium   LOS: 2 days

## 2019-01-23 NOTE — Plan of Care (Signed)
  Problem: Education: Goal: Knowledge of General Education information will improve Description Including pain rating scale, medication(s)/side effects and non-pharmacologic comfort measures Outcome: Progressing   

## 2019-01-24 ENCOUNTER — Inpatient Hospital Stay (HOSPITAL_COMMUNITY): Payer: PPO

## 2019-01-24 LAB — URINALYSIS, COMPLETE (UACMP) WITH MICROSCOPIC
Bacteria, UA: NONE SEEN
Bilirubin Urine: NEGATIVE
Glucose, UA: NEGATIVE mg/dL
Hgb urine dipstick: NEGATIVE
Ketones, ur: NEGATIVE mg/dL
Leukocytes,Ua: NEGATIVE
Nitrite: NEGATIVE
Protein, ur: NEGATIVE mg/dL
Specific Gravity, Urine: 1.009 (ref 1.005–1.030)
pH: 6 (ref 5.0–8.0)

## 2019-01-24 LAB — BASIC METABOLIC PANEL
Anion gap: 12 (ref 5–15)
BUN: 16 mg/dL (ref 8–23)
CO2: 19 mmol/L — ABNORMAL LOW (ref 22–32)
Calcium: 8.4 mg/dL — ABNORMAL LOW (ref 8.9–10.3)
Chloride: 111 mmol/L (ref 98–111)
Creatinine, Ser: 1.39 mg/dL — ABNORMAL HIGH (ref 0.44–1.00)
GFR calc Af Amer: 42 mL/min — ABNORMAL LOW (ref >60)
GFR calc non Af Amer: 36 mL/min — ABNORMAL LOW (ref >60)
Glucose, Bld: 101 mg/dL — ABNORMAL HIGH (ref 70–99)
Potassium: 3.6 mmol/L (ref 3.5–5.1)
Sodium: 142 mmol/L (ref 135–145)

## 2019-01-24 LAB — CBC WITH DIFFERENTIAL/PLATELET
Abs Immature Granulocytes: 0.09 10*3/uL — ABNORMAL HIGH (ref 0.00–0.07)
Basophils Absolute: 0 10*3/uL (ref 0.0–0.1)
Basophils Relative: 0 %
Eosinophils Absolute: 0 10*3/uL (ref 0.0–0.5)
Eosinophils Relative: 0 %
HCT: 35 % — ABNORMAL LOW (ref 36.0–46.0)
Hemoglobin: 11.8 g/dL — ABNORMAL LOW (ref 12.0–15.0)
Immature Granulocytes: 1 %
Lymphocytes Relative: 13 %
Lymphs Abs: 2.2 10*3/uL (ref 0.7–4.0)
MCH: 30.3 pg (ref 26.0–34.0)
MCHC: 33.7 g/dL (ref 30.0–36.0)
MCV: 89.7 fL (ref 80.0–100.0)
Monocytes Absolute: 1.6 10*3/uL — ABNORMAL HIGH (ref 0.1–1.0)
Monocytes Relative: 10 %
Neutro Abs: 12.7 10*3/uL — ABNORMAL HIGH (ref 1.7–7.7)
Neutrophils Relative %: 76 %
Platelets: 248 10*3/uL (ref 150–400)
RBC: 3.9 MIL/uL (ref 3.87–5.11)
RDW: 13.8 % (ref 11.5–15.5)
WBC: 16.7 10*3/uL — ABNORMAL HIGH (ref 4.0–10.5)
nRBC: 0 % (ref 0.0–0.2)

## 2019-01-24 LAB — PROCALCITONIN: Procalcitonin: 0.11 ng/mL

## 2019-01-24 MED ORDER — METOPROLOL SUCCINATE ER 25 MG PO TB24
25.0000 mg | ORAL_TABLET | Freq: Every day | ORAL | Status: DC
  Administered 2019-01-24 – 2019-01-25 (×2): 25 mg via ORAL
  Filled 2019-01-24 (×2): qty 1

## 2019-01-24 NOTE — Progress Notes (Addendum)
[facility] PROGRESS NOTE  TORINA EY [id number] DOB: 1941-04-09 DOA: 01/21/2019 PCP: Lucille Passy, MD  P Metabolic encephalopathy Most likely undiagnosed dementia which is worsening secondary to being outside of usual environment and poor sleep hygiene in the hospital We are checking for infection although this is unlikely-see below discussion regarding planning-she did not tolerate Ativan on 7/5 p.m. and became even more agitated Gentle redirection-sitter-family to be allowed to come by and visit-we will need formal work-up in the outpatient setting with testing-I had a long discussion with family detailing my thoughts and I do not think she has had a further stroke or lesion to the brain hence we will hold on any further brain imaging- if we do not find a source of leukocytosis and this comes down on its own we will probably let her go home 7/8 otherwise see below Syncope, altered mental status Secondary to rapid A. Fib on admit most likely-patient will need management of the same New onset A. fib RVR chads score >3-now on Eliquis Elevated troponin 13 beats of V. tach overnight 7/7 Patient is a smoker -?2/2 cor pulmonale--->related to  A. fib Echo below Cardizem CD 180, Xarelto on discharge Added metoprolol XL 25 on 7/7 monitor on telemetry  leukocytosis Etiology unclear-repeat UA is negative, repeated urine culture I do not think her atelectasis is an actual pneumonia-because her procalcitonin is low I would discontinue antibiotics on 7/8 Repeat labs a.m.-if WBC still ? 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, mild metabolic acidosis with contraction alkalosis Patient had acute kidney injury on admission which is resolved with IV fluid Continue the same overnight repeat labs Distant history of intraductal carcinoma status post  mastectomy 2014 Interval follow-up with PCP- BMI 18 Will need supplements and discussion about the same as outpatient Independent risk factor for mortality Sensorineural hearing loss Astigmatism COPD continued smoker Continue inhalers-no wheeze-unlikely will quit   --- Synopsis 78 year old female, COPD stage D + continued tobacco, HTN, arthritis, Chronic kidney disease followed by nephrology Sensorineural hearing loss Astigmatism BMI of 18 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 fib RVR no fever CT head negative for anything intracranially on admission  Cardiology consulted   More confused overnight 7/5    DVT Eliquis Code Status: Full Communication: Long discussion with daughter [name] on telephone and the other daughter [name] at the bedside spent 15 minutes going over planning as well as diagnostics Disposition Plan: Not ready for discharge just yet-may need further work-up of leukocytosis if there is a fever may need scans-therapy is recommended home health which needs to be ordered at time of discharge mobile    Delmi Fulfer, MD  [facility] Via [employer] app OR [email or web address] 7PM-7AM contact night coverage as above 01/24/2019, 1:05 PM  LOS: 3 days  ---  Consultants:  Cardiology  Procedures:  Echo pending  Antimicrobials:  None --- Today  Still not sleeping well, still confused Daughter in room today tells me multiple family members have moderate to severe dementia patient is able to orient to place year can tell me her daughter's name and can tell me various other orienting information  however thinks that she is at "[facility]" and that they are visiting Patient herself has never exhibited the symptoms or  signs in the past and is usually very coherent  O  Vitals:  Vitals:   01/24/19 0440 01/24/19 0815  BP: (!) 181/83   Pulse: (!) 101   Resp: 17   Temp: 97.9 F (36.6 C)   SpO2: 100% 97%    Exam:  Awake confused pleasant moderate dentition upper edentulous lower Neck soft supple No distress Chest bilaterally clear posterolaterally no rales no fremitus no resonance S1-S2 no murmur Abdomen soft No lower extremity edema Power 5/5 moving 4 limbs no focal deficit--sensory intact  I have personally reviewed the following:   DATA White count 13-->16.7 BUN/creatinine down from 28/2.0-->26/1.5-->20/1.4-->16/1.3 bicarb is 21-->19 Sodium down from 146-1 42 Chloride down from 115-1 11 Procalcitonin is less than 0.11 proBNP 353 LDL 68 HDL 45 Hemoglobin 11.3    Please see prior results for CT scan which was essentially negative on admission  IMPRESSIONS echocardiogram    1. The left ventricle has normal systolic function with an ejection fraction of 60-65%. The cavity size was normal. There is moderate concentric left ventricular hypertrophy. Left ventricular diastolic Doppler parameters are consistent with  pseudonormalization. Elevated left atrial and left ventricular end-diastolic pressures.  2. The right ventricle has normal systolic function. The cavity was normal. There is no increase in right ventricular wall thickness. Right ventricular systolic pressure is moderately elevated.  3. Mitral valve regurgitation is moderate by color flow Doppler.  4. The aortic valve is tricuspid. Moderate thickening of the aortic valve. Moderate calcification of the aortic valve. Aortic valve regurgitation was not assessed by color flow Doppler.  5. The inferior vena cava was dilated in size with <50% respiratory variability.   Scheduled Meds: . busPIRone  15 mg Oral BID  . diltiazem  180 mg Oral Daily  . feeding supplement (ENSURE ENLIVE)  237 mL Oral BID BM  . ipratropium-albuterol   3 mL Nebulization BID  . metoprolol succinate  25 mg Oral Daily  . mirtazapine  15 mg Oral QHS  . mometasone-formoterol  2 puff Inhalation BID  . multivitamin with minerals  1 tablet Oral Q M,W,F  . nicotine  14 mg Transdermal Daily  . sodium chloride flush  3 mL Intravenous Q12H  . vitamin C  500-1,000 mg Oral Q M,W,F   Continuous Infusions: . [START ON 01/25/2019] levofloxacin (LEVAQUIN) IV      Principal Problem:   Atrial fibrillation with RVR (HCC) Active Problems:   Depression   HYPERTENSION, BENIGN ESSENTIAL   Asthma, chronic, unspecified asthma severity, with acute exacerbation   CHRONIC KIDNEY DISEASE STAGE III (MODERATE)   COPD (chronic obstructive pulmonary disease) (HCC)   Tobacco abuse   Elevated troponin   Delirium   LOS: 3 days

## 2019-01-24 NOTE — Progress Notes (Signed)
Occupational Therapy Treatment Patient Details Name: Bianca [name] MRN: [id number] DOB: [birth year] Today's Date: 01/24/2019    History of present illness Pt adm after syncope at home. Found to have afib with rvr with orthostatic hypotension. PMH - copd,asthma, anxiety, htn,arthritis, breast CA   OT comments  Pt making progress with functional goals. Sup with ADLs, min guard A with grooming standing at sink and  with toilet transfers. Pt stood x 3 minutes with Fair - balance. Pt unsteady during sit - stand transitions form bed to [facility], standing at sink and pt required verbal and physical cues for safety. Pt's daughter present and would like pt to have Life Alert and to use a rollater. OT educated pt's daughter on differences in RWs and safety concerns regarding opt remembering to lock brakes on a rollater. OT will inform PT of pt's balance impairments this session and daughter's request for a RW. OT will continue to follow acutely  Follow Up Recommendations  Home health OT;Supervision/Assistance - 24 hour    Equipment Recommendations  Tub/shower bench;Other (comment)(RW/rollater?? - can pt use safely?)    Recommendations for Other Services      Precautions / Restrictions Precautions Precautions: Fall Restrictions Weight Bearing Restrictions: No       Mobility Bed Mobility Overal bed mobility: Modified Independent                Transfers Overall transfer level: Needs assistance Equipment used: 1 person hand held assist Transfers: Sit to/from Stand           General transfer comment: unsteady, increased time to rise, decreased safety with uncontrolled descent despite cuing for use of armrests    Balance Overall balance assessment: Needs assistance   Sitting balance-Leahy Scale: Good     Standing balance support: No upper extremity supported;During functional activity Standing balance-Leahy Scale: (Fair-)                             ADL either  performed or assessed with clinical judgement   ADL Overall ADL's : At baseline;Needs assistance/impaired     Grooming: Wash/dry hands;Wash/dry face;Standing;Cueing for safety;Min guard                   Toilet Transfer: Min guard;Ambulation;BSC           Functional mobility during ADLs: Min guard General ADL Comments: min guard A with grooming standing at sink and  with toilet transfers. Pt stood x 3 minutes with Fair - balance     Vision Baseline Vision/History: Wears glasses Wears Glasses: At all times     Perception     Praxis      Cognition Arousal/Alertness: Awake/alert Behavior During Therapy: [facility] for tasks assessed/performed Overall Cognitive Status: Impaired/Different from baseline Area of Impairment: Orientation;Safety/judgement;Awareness;Problem solving;Following commands;Memory                     Memory: Decreased short-term memory Following Commands: Follows multi-step commands inconsistently Safety/Judgement: Decreased awareness of safety;Decreased awareness of deficits   Problem Solving: Slow processing;Difficulty sequencing;Requires verbal cues;Requires tactile cues General Comments: Requires increased verbal cues for tasks HoH vs slow processing,        Exercises     Shoulder Instructions       General Comments      Pertinent Vitals/ Pain       Pain Assessment: No/denies pain  Home Living  Prior Functioning/Environment              Frequency  Min 2X/week        Progress Toward Goals  OT Goals(current goals can now be found in the care plan section)  Progress towards OT goals: Progressing toward goals  Acute Rehab OT Goals Patient Stated Goal: return home  Plan Discharge plan remains appropriate    Co-evaluation                 AM-PAC OT "6 Clicks" Daily Activity     Outcome Measure   Help from another person eating meals?: None Help  from another person taking care of personal grooming?: A Little Help from another person toileting, which includes using toliet, bedpan, or urinal?: A Little Help from another person bathing (including washing, rinsing, drying)?: A Little Help from another person to put on and taking off regular upper body clothing?: None Help from another person to put on and taking off regular lower body clothing?: A Little 6 Click Score: 20    End of Session Equipment Utilized During Treatment: Gait belt  OT Visit Diagnosis: Unsteadiness on feet (R26.81);Muscle weakness (generalized) (M62.81);Other symptoms and signs involving cognitive function;History of falling (Z91.81)   Activity Tolerance Patient tolerated treatment well   Patient Left with call bell/phone within reach;in bed;with family/visitor present   Nurse Communication          Time: 2863-8177 OT Time Calculation (min): 43 min  Charges: OT General Charges $OT Visit: 1 Visit OT Treatments $Self Care/Home Management : 8-22 mins $Therapeutic Activity: 23-37 mins     Britt Bottom 01/24/2019, 1:16 PM

## 2019-01-24 NOTE — Care Management Important Message (Signed)
Important Message  Patient Details  Name: Bianca [name] MRN: [id number] Date of Birth: 1941/05/28   Medicare Important Message Given:  Yes     Shelda Altes 01/24/2019, 2:00 PM

## 2019-01-25 LAB — CBC WITH DIFFERENTIAL/PLATELET
Abs Immature Granulocytes: 0.05 10*3/uL (ref 0.00–0.07)
Basophils Absolute: 0 10*3/uL (ref 0.0–0.1)
Basophils Relative: 0 %
Eosinophils Absolute: 0 10*3/uL (ref 0.0–0.5)
Eosinophils Relative: 0 %
HCT: 34 % — ABNORMAL LOW (ref 36.0–46.0)
Hemoglobin: 11.4 g/dL — ABNORMAL LOW (ref 12.0–15.0)
Immature Granulocytes: 0 %
Lymphocytes Relative: 12 %
Lymphs Abs: 1.4 10*3/uL (ref 0.7–4.0)
MCH: 29.5 pg (ref 26.0–34.0)
MCHC: 33.5 g/dL (ref 30.0–36.0)
MCV: 87.9 fL (ref 80.0–100.0)
Monocytes Absolute: 1 10*3/uL (ref 0.1–1.0)
Monocytes Relative: 8 %
Neutro Abs: 9.7 10*3/uL — ABNORMAL HIGH (ref 1.7–7.7)
Neutrophils Relative %: 80 %
Platelets: 246 10*3/uL (ref 150–400)
RBC: 3.87 MIL/uL (ref 3.87–5.11)
RDW: 13.7 % (ref 11.5–15.5)
WBC: 12.2 10*3/uL — ABNORMAL HIGH (ref 4.0–10.5)
nRBC: 0 % (ref 0.0–0.2)

## 2019-01-25 LAB — COMPREHENSIVE METABOLIC PANEL
ALT: 26 U/L (ref 0–44)
AST: 41 U/L (ref 15–41)
Albumin: 2.8 g/dL — ABNORMAL LOW (ref 3.5–5.0)
Alkaline Phosphatase: 56 U/L (ref 38–126)
Anion gap: 10 (ref 5–15)
BUN: 14 mg/dL (ref 8–23)
CO2: 19 mmol/L — ABNORMAL LOW (ref 22–32)
Calcium: 7.8 mg/dL — ABNORMAL LOW (ref 8.9–10.3)
Chloride: 109 mmol/L (ref 98–111)
Creatinine, Ser: 1.29 mg/dL — ABNORMAL HIGH (ref 0.44–1.00)
GFR calc Af Amer: 46 mL/min — ABNORMAL LOW (ref >60)
GFR calc non Af Amer: 39 mL/min — ABNORMAL LOW (ref >60)
Glucose, Bld: 112 mg/dL — ABNORMAL HIGH (ref 70–99)
Potassium: 3.4 mmol/L — ABNORMAL LOW (ref 3.5–5.1)
Sodium: 138 mmol/L (ref 135–145)
Total Bilirubin: 0.8 mg/dL (ref 0.3–1.2)
Total Protein: 5.7 g/dL — ABNORMAL LOW (ref 6.5–8.1)

## 2019-01-25 LAB — URINE CULTURE
Culture: NO GROWTH
Special Requests: NORMAL

## 2019-01-25 LAB — MAGNESIUM: Magnesium: 1.8 mg/dL (ref 1.7–2.4)

## 2019-01-25 LAB — SEDIMENTATION RATE: Sed Rate: 68 mm/hr — ABNORMAL HIGH (ref 0–22)

## 2019-01-25 LAB — PROCALCITONIN: Procalcitonin: 0.18 ng/mL

## 2019-01-25 LAB — PHOSPHORUS: Phosphorus: 2 mg/dL — ABNORMAL LOW (ref 2.5–4.6)

## 2019-01-25 LAB — ANTI-DNA ANTIBODY, DOUBLE-STRANDED: ds DNA Ab: <1 IU/mL (ref 0–9)

## 2019-01-25 LAB — C-REACTIVE PROTEIN: CRP: 21.2 mg/dL — ABNORMAL HIGH (ref <1.0)

## 2019-01-25 MED ORDER — DILTIAZEM HCL ER COATED BEADS 180 MG PO CP24: 180.0000 mg | ORAL_CAPSULE | Freq: Every day | ORAL | 0 refills | Status: DC

## 2019-01-25 MED ORDER — LEVOFLOXACIN 500 MG PO TABS: 500.0000 mg | ORAL_TABLET | Freq: Every day | ORAL | 0 refills | Status: AC

## 2019-01-25 MED ORDER — METOPROLOL SUCCINATE ER 25 MG PO TB24: 25.0000 mg | ORAL_TABLET | Freq: Every day | ORAL | 0 refills | Status: DC

## 2019-01-25 MED ORDER — APIXABAN 5 MG PO TABS: 5.0000 mg | ORAL_TABLET | Freq: Two times a day (BID) | ORAL | 0 refills | Status: DC

## 2019-01-25 NOTE — Discharge Summary (Signed)
Physician Discharge Summary  Bianca [name] BDZ:[id number] DOB: 25-Sep-1940 DOA: 01/21/2019  PCP: Lucille Passy, MD  Admit date: 01/21/2019 Discharge date: 01/25/2019  Admitted From: Home Disposition: Home  Recommendations for Outpatient Follow-up:  1. Follow up with PCP in 1 week 2. Follow-up with cardiology, Dr. Lovena Le as scheduled 3. Newly started on Eliquis, Cardizem, metoprolol for A. fib with RVR  Home Health: Yes, PT/RN Equipment/Devices: None  Discharge Condition: Stable CODE STATUS: Full code Diet recommendation: Heart Healthy  History of present illness:  Bianca [name] is a 78 y.o. female with medical history significant of COPD on daily inhalers, continued tobacco use, asthma, anxiety, hypertension, and arthritis who presents the emergency department episode of syncope at home.  Obtained from the chart,'s, and ED physician as I was unable to reach the patient's daughter by phone.  Patient arrived here via EMS from home the family had taken her to physical therapy in the morning and did not think she sounded like herself.  Went to the patient's home in the afternoon and found her on the bed slow to respond.  EMS reported a blood pressure of 68/42 and then 92/48.  Her temperature was 97.2 but her heart rate was 130-160.  With a respiration of 20.   In the emergency department she was started on a diltiazem drip and had significant relief of her symptoms however her heart rate continued to vacillate between the 160s and then below 30s.  Blood pressure stabilized in the 109/68 range.  Opponent was found to be elevated at 196, her creatinine elevated 2.03, glucose 141, EKG consistent with A. fib with RVR this was apparently a new problem. Referred to me for further evaluation and management.  Hospital course:  Paroxysmal atrial fibrillation with RVR, new onset Patient presenting to ED with syncopal episode.  Was found to be in atrial fibrillation with RVR with heart rates between  130-160.  Was initially started on a Cardizem drip.  Cardiology was consulted and followed during hospital course.  Echocardiogram notable for preserved EF and moderate MR.  Patient was transitioned from IV Cardizem to oral Cardizem 30 mg p.o. daily.  She was also started on metoprolol succinate 25 mg p.o. daily with good control in regards to her heart rate.  Patient's CHADS-VASC score elevated at 4. Will start on chronic anticoagulation with Eliquis 5 mg p.o. twice daily; chosen secondary to her creatinine clearance of 29.  Patient to follow-up with cardiology, Dr. Lovena Le outpatient.  Syncope Patient reported syncopal episode at home, was found to be in A. fib with RVR with mild hypotension.  Etiology likely secondary to arrhythmia.  CT head without acute findings, mild chronic white matter changes.  No further episodes while inpatient, now with arrhythmia controlled as above.  Acute metabolic encephalopathy Patient with confusion on presentation, slightly exacerbated during the hospitalization initially.  Presume hospital-acquired delirium in the setting of syncopal episode from arrhythmia as contributing factors.  Patient's daughter no concerns of dementia-like symptoms at home.  CT head without acute findings.  Questionable pneumonia left lower lobe on chest x-ray.  Urinalysis/urine culture unrevealing.  Treated for pneumonia with Levaquin.  Mentation seems to improved to be at baseline.  No further work-up at this time.  Pneumonia, community-acquired Chest x-ray notable for left lower lobe atelectasis versus infiltrate.  COVID-19 test negative.  Did have slightly elevated white blood cell count.  Started on Levaquin.  WBC count improved.  We will continue 5-day course of Levaquin outpatient.  AKI on CKD stage II Patient presented with a slight elevation in her creatinine.  Previously followed by nephrology.  Received IV fluid hydration on admission with improvement of her creatinine.  Etiology  likely secondary to dehydration versus ATN with mild hypotension on presentation.  Creatinine at time of discharge 1.29.  COPD Continued tobacco abuse Patient counseled on need for tobacco cessation.  Continue outpatient Advair.  Moderate protein calorie malnutrition BMI 18.  Recommend continued supplements with Ensure.  Outpatient follow-up.  On mirtazapine outpatient.  Recommend continue to follow weights.  Discharge Diagnoses:  Principal Problem:   Atrial fibrillation with RVR (HCC) Active Problems:   Depression   HYPERTENSION, BENIGN ESSENTIAL   Asthma, chronic, unspecified asthma severity, with acute exacerbation   CHRONIC KIDNEY DISEASE STAGE III (MODERATE)   COPD (chronic obstructive pulmonary disease) (HCC)   Tobacco abuse   Elevated troponin   Delirium    Discharge Instructions  Discharge Instructions    Call MD for:  difficulty breathing, headache or visual disturbances   Complete by: As directed    Call MD for:  persistant dizziness or light-headedness   Complete by: As directed    Call MD for:  temperature >100.4   Complete by: As directed    Diet - low sodium heart healthy   Complete by: As directed    Increase activity slowly   Complete by: As directed      Allergies as of 01/25/2019   No Known Allergies     Medication List    TAKE these medications   Advair Diskus 100-50 MCG/DOSE Aepb Generic drug: Fluticasone-Salmeterol TAKE 1 PUFF TWICE A DAY AS DIRECTED RINSE MOUTH AFTER USE What changed:   how much to take  how to take this  when to take this  additional instructions   apixaban 5 MG Tabs tablet Commonly known as: Eliquis Take 1 tablet (5 mg total) by mouth 2 (two) times daily.   busPIRone 15 MG tablet Commonly known as: BUSPAR Take 1 tablet (15 mg total) by mouth 2 (two) times daily.   CAL/MAG PO Take 1 tablet by mouth 3 (three) times a week.   Centrum Silver 50+Women Tabs Take 1 tablet by mouth 3 (three) times a week.    diltiazem 180 MG 24 hr capsule Commonly known as: CARDIZEM CD Take 1 capsule (180 mg total) by mouth daily.   fexofenadine 180 MG tablet Commonly known as: ALLEGRA Take 180 mg by mouth daily.   lactose free nutrition Liqd Take 237 mLs by mouth 2 (two) times daily between meals.   levofloxacin 500 MG tablet Commonly known as: Levaquin Take 1 tablet (500 mg total) by mouth daily for 4 days.   losartan 25 MG tablet Commonly known as: COZAAR Take 0.5 tablets (12.5 mg total) by mouth 2 (two) times daily.   metoprolol succinate 25 MG 24 hr tablet Commonly known as: TOPROL-XL Take 1 tablet (25 mg total) by mouth daily.   mirtazapine 15 MG tablet Commonly known as: Remeron Take 1 tablet (15 mg total) by mouth at bedtime.   OMEGA 3 PO Take 1-2 capsules by mouth daily.   ProAir HFA 108 (90 Base) MCG/ACT inhaler Generic drug: albuterol INHALE 1-2 PUFFS INTO THE LUNGS EVERY 4 HOURS AS NEEDED What changed: See the new instructions.   Tart Cherry Advanced Caps Take 1-2 capsules by mouth 3 (three) times a week.   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   Vitamin D3 50 MCG (2000 UT) Tabs Take 2,000 Units by mouth 3 (three) times a week.      Follow-up Information    Fenton, Clint R, PA Follow up on 02/03/2019.   Specialty: Cardiology Why: 1:30 pm  Call for parking code Contact information: [place] [address] [phone]        [facility] Follow up.   Specialty: Home Health Services Why: PT and OT Contact information: [address] [place] [address] [phone]        Lucille Passy, MD. Call in 1 week(s).   Specialty: Family Medicine Contact information: [place] [address] [phone]        Evans Lance, MD .   Specialty:  Cardiology Contact information: [phone] [address] [place] [address] [phone]          No Known Allergies  Consultations:  Cardiology   Procedures/Studies: Dg Chest 1 View  Result Date: 01/24/2019 CLINICAL DATA:  Follow-up pneumothorax atelectasis EXAM: CHEST  1 VIEW COMPARISON:  Portable exam 0631 hours compared to 01/23/2019 FINDINGS: Normal heart size, mediastinal contours, and pulmonary vascularity. Atherosclerotic calcification aorta. Emphysematous changes with atelectasis versus infiltrate LEFT lower lobe, slightly increased. No additional infiltrate, pleural effusion or pneumothorax. Bones diffusely demineralized. IMPRESSION: Increased LEFT lower lobe atelectasis versus infiltrate. Electronically Signed   By: Lavonia Dana M.D.   On: 01/24/2019 08:11   Dg Chest 1 View  Result Date: 01/23/2019 CLINICAL DATA:  78 year old female with history of asthma and COPD. Concern for pneumonia. EXAM: CHEST  1 VIEW COMPARISON:  Chest radiograph dated 01/21/2019 FINDINGS: Minimal left lung base atelectatic changes. Infiltrate is not excluded. Clinical correlation is recommended. The right lung is clear. There is no pleural effusion or pneumothorax. Stable cardiac silhouette. Atherosclerotic calcification of the aorta. Osteopenia. No acute osseous pathology. IMPRESSION: Left lung base atelectasis versus infiltrate. Electronically Signed   By: Anner Crete M.D.   On: 01/23/2019 16:22   Ct Head Wo Contrast  Result Date: 01/21/2019 CLINICAL DATA:  Acute mental status change. EXAM: CT HEAD WITHOUT CONTRAST TECHNIQUE: Contiguous axial images were obtained from the base of the skull through the vertex without intravenous contrast. COMPARISON:  None. FINDINGS: Brain: No subdural, epidural, or subarachnoid hemorrhage. Ventricles and sulci are prominent. The cerebellum, brainstem, and basal cisterns are normal. No mass effect or midline shift. Moderate white matter changes are noted. No  acute cortical ischemia or infarct identified. Vascular: No hyperdense vessel or unexpected calcification. Skull: Normal. Negative for fracture or focal lesion. Sinuses/Orbits: No acute finding. Other: None. IMPRESSION: No acute intracranial abnormalities.  Chronic white matter changes. Electronically Signed   By: Dorise Bullion III M.D   On: 01/21/2019 17:21   Dg Chest Portable 1 View  Result Date: 01/21/2019 CLINICAL DATA:  Atrial fibrillation EXAM: PORTABLE CHEST 1 VIEW COMPARISON:  None. FINDINGS: The heart size and mediastinal contours are within normal limits. Both lungs are clear. The visualized skeletal structures are unremarkable. IMPRESSION: No active disease. Electronically Signed   By: Dorise Bullion III M.D   On: 01/21/2019 16:46     Transthoracic echocardiogram IMPRESSIONS    1. The left ventricle has normal systolic function with an ejection fraction of 60-65%. The cavity size was normal. There is moderate concentric left ventricular  hypertrophy. Left ventricular diastolic Doppler parameters are consistent with  pseudonormalization. Elevated left atrial and left ventricular end-diastolic pressures.  2. The right ventricle has normal systolic function. The cavity was normal. There is no increase in right ventricular wall thickness. Right ventricular systolic pressure is moderately elevated.  3. Mitral valve regurgitation is moderate by color flow Doppler.  4. The aortic valve is tricuspid. Moderate thickening of the aortic valve. Moderate calcification of the aortic valve. Aortic valve regurgitation was not assessed by color flow Doppler.  5. The inferior vena cava was dilated in size with <50% respiratory variability.   Subjective: Patient seen and examined at bedside, resting comfortably.  Daughter present.  Feels ready for discharge home today.  No complaints overnight.  Denies headache, no visual changes, no chest pain, no palpitations, no nausea/vomiting/diarrhea, no  fever/chills/night sweats, no abdominal pain, no weakness, no cough/congestion, no paresthesias.  No acute events overnight per nursing staff.   Discharge Exam: Vitals:   01/25/19 0726 01/25/19 0734  BP:    Pulse: 95   Resp: 16   Temp:    SpO2: 95% 95%   Vitals:   01/24/19 2000 01/25/19 0505 01/25/19 0726 01/25/19 0734  BP:  (!) 151/96    Pulse:  95 95   Resp: (!) 22 20 16    Temp:  98.7 F (37.1 C)    TempSrc:  Oral    SpO2:  95% 95% 95%  Weight:  52 kg    Height:        General: Pt is alert, awake, not in acute distress Cardiovascular: Irregularly irregular rhythm, normal rate, S1/S2 +, no rubs, no gallops Respiratory: CTA bilaterally, no wheezing, no rhonchi Abdominal: Soft, NT, ND, bowel sounds + Extremities: no edema, no cyanosis    The results of significant diagnostics from this hospitalization (including imaging, microbiology, ancillary and laboratory) are listed below for reference.     Microbiology: Recent Results (from the past 240 hour(s))  SARS Coronavirus 2 (CEPHEID- Performed in [place] hospital lab), Hosp Order     Status: None   Collection Time: 01/21/19  3:39 PM   Specimen: Nasopharyngeal Swab  Result Value Ref Range Status   SARS Coronavirus 2 NEGATIVE NEGATIVE Final    Comment: (NOTE) If result is NEGATIVE SARS-CoV-2 target nucleic acids are NOT DETECTED. The SARS-CoV-2 RNA is generally detectable in upper and lower  respiratory specimens during the acute phase of infection. The lowest  concentration of SARS-CoV-2 viral copies this assay can detect is 250  copies / mL. A negative result does not preclude SARS-CoV-2 infection  and should not be used as the sole basis for treatment or other  patient management decisions.  A negative result may occur with  improper specimen collection / handling, submission of specimen other  than nasopharyngeal swab, presence of viral mutation(s) within the  areas targeted by this assay, and inadequate number  of viral copies  (<250 copies / mL). A negative result must be combined with clinical  observations, patient history, and epidemiological information. If result is POSITIVE SARS-CoV-2 target nucleic acids are DETECTED. The SARS-CoV-2 RNA is generally detectable in upper and lower  respiratory specimens dur ing the acute phase of infection.  Positive  results are indicative of active infection with SARS-CoV-2.  Clinical  correlation with patient history and other diagnostic information is  necessary to determine patient infection status.  Positive results do  not rule out bacterial infection or co-infection with other viruses. If result is PRESUMPTIVE  POSTIVE SARS-CoV-2 nucleic acids MAY BE PRESENT.   A presumptive positive result was obtained on the submitted specimen  and confirmed on repeat testing.  While 2019 novel coronavirus  (SARS-CoV-2) nucleic acids may be present in the submitted sample  additional confirmatory testing may be necessary for epidemiological  and / or clinical management purposes  to differentiate between  SARS-CoV-2 and other Sarbecovirus currently known to infect humans.  If clinically indicated additional testing with an alternate test  methodology [phone]) is advised. The SARS-CoV-2 RNA is generally  detectable in upper and lower respiratory sp ecimens during the acute  phase of infection. The expected result is Negative. Fact Sheet for Patients:  StrictlyIdeas.no Fact Sheet for Healthcare Providers: BankingDealers.co.za This test is not yet approved or cleared by the [place] FDA and has been authorized for detection and/or diagnosis of SARS-CoV-2 by FDA under an Emergency Use Authorization (EUA).  This EUA will remain in effect (meaning this test can be used) for the duration of the COVID-19 declaration under Section 564(b)(1) of the Act, 21 U.S.C. section 360bbb-3(b)(1), unless the authorization is  terminated or revoked sooner. Performed at [facility], [place] [address], [place], [place] [address]   Urine culture     Status: Abnormal   Collection Time: 01/21/19 11:08 PM   Specimen: Urine, Clean Catch  Result Value Ref Range Status   Specimen Description URINE, CLEAN CATCH  Final   Special Requests NONE  Final   Culture (A)  Final    <10,000 COLONIES/mL INSIGNIFICANT GROWTH Performed at [facility], [place] [address], [place], [place] [address]    Report Status 01/23/2019 FINAL  Final  Culture, Urine     Status: None   Collection Time: 01/24/19 11:04 AM   Specimen: Urine, Catheterized  Result Value Ref Range Status   Specimen Description URINE, CATHETERIZED  Final   Special Requests Normal  Final   Culture   Final    NO GROWTH Performed at [place] [address] [place], [place] [address]    Report Status 01/25/2019 FINAL  Final     Labs: BNP (last 3 results) Recent Labs    01/21/19 1902  BNP 025.4*   Basic Metabolic Panel: Recent Labs  Lab 01/21/19 1537 01/22/19 0431 01/22/19 1009 01/23/19 0543 01/24/19 0415 01/25/19 0627  NA 140 142  --  146* 142 138  K 4.6 3.8  --  3.7 3.6 3.4*  CL 112* 114*  --  115* 111 109  CO2 20* 21*  --  19* 19* 19*  GLUCOSE 141* 90  --  115* 101* 112*  BUN 28* 26*  --  20 16 14   CREATININE 2.03* 1.59*  --  1.45* 1.39* 1.29*  CALCIUM 8.5* 8.1*  --  8.4* 8.4* 7.8*  MG 2.3  --  2.0  --   --  1.8  PHOS  --   --   --   --   --  2.0*   Liver Function Tests: Recent Labs  Lab 01/21/19 1537 01/25/19 0627  AST 24 41  ALT 16 26  ALKPHOS 53 56  BILITOT 0.7 0.8  PROT 5.7* 5.7*  ALBUMIN 3.2* 2.8*   No results for input(s): LIPASE, AMYLASE in the last 168 hours. No results for input(s): AMMONIA in the last 168 hours. CBC: Recent Labs  Lab 01/21/19 1537 01/22/19 0431 01/23/19 0543 01/24/19 0415 01/25/19 0627  WBC 10.0 9.4 13.2* 16.7* 12.2*  NEUTROABS 7.5  --  10.4* 12.7* 9.7*  HGB 11.3* 10.2* 11.3*  11.8* 11.4*  HCT 35.4* 30.8* 34.1* 35.0* 34.0*  MCV 92.4 88.8 91.2 89.7 87.9  PLT 284 265 275 248 246   Cardiac Enzymes: No results for input(s): CKTOTAL, CKMB, CKMBINDEX, TROPONINI in the last 168 hours. BNP: Invalid input(s): POCBNP CBG: No results for input(s): GLUCAP in the last 168 hours. D-Dimer No results for input(s): DDIMER in the last 72 hours. Hgb A1c No results for input(s): HGBA1C in the last 72 hours. Lipid Profile No results for input(s): CHOL, HDL, LDLCALC, TRIG, CHOLHDL, LDLDIRECT in the last 72 hours. Thyroid function studies No results for input(s): TSH, T4TOTAL, T3FREE, THYROIDAB in the last 72 hours.  Invalid input(s): FREET3 Anemia work up No results for input(s): VITAMINB12, FOLATE, FERRITIN, TIBC, IRON, RETICCTPCT in the last 72 hours. Urinalysis    Component Value Date/Time   COLORURINE YELLOW 01/24/2019 0929   APPEARANCEUR CLEAR 01/24/2019 0929   LABSPEC 1.009 01/24/2019 0929   PHURINE 6.0 01/24/2019 0929   GLUCOSEU NEGATIVE 01/24/2019 0929   GLUCOSEU NEGATIVE 09/13/2018 1419   HGBUR NEGATIVE 01/24/2019 0929   BILIRUBINUR NEGATIVE 01/24/2019 0929   BILIRUBINUR neg. 01/31/2013 1145   KETONESUR NEGATIVE 01/24/2019 0929   PROTEINUR NEGATIVE 01/24/2019 0929   UROBILINOGEN 0.2 09/13/2018 1419   NITRITE NEGATIVE 01/24/2019 0929   LEUKOCYTESUR NEGATIVE 01/24/2019 0929   Sepsis Labs Invalid input(s): PROCALCITONIN,  WBC,  LACTICIDVEN Microbiology Recent Results (from the past 240 hour(s))  SARS Coronavirus 2 (CEPHEID- Performed in [place] hospital lab), Hosp Order     Status: None   Collection Time: 01/21/19  3:39 PM   Specimen: Nasopharyngeal Swab  Result Value Ref Range Status   SARS Coronavirus 2 NEGATIVE NEGATIVE Final    Comment: (NOTE) If result is NEGATIVE SARS-CoV-2 target nucleic acids are NOT DETECTED. The SARS-CoV-2 RNA is generally detectable in upper and lower  respiratory specimens during the acute phase of infection. The  lowest  concentration of SARS-CoV-2 viral copies this assay can detect is 250  copies / mL. A negative result does not preclude SARS-CoV-2 infection  and should not be used as the sole basis for treatment or other  patient management decisions.  A negative result may occur with  improper specimen collection / handling, submission of specimen other  than nasopharyngeal swab, presence of viral mutation(s) within the  areas targeted by this assay, and inadequate number of viral copies  (<250 copies / mL). A negative result must be combined with clinical  observations, patient history, and epidemiological information. If result is POSITIVE SARS-CoV-2 target nucleic acids are DETECTED. The SARS-CoV-2 RNA is generally detectable in upper and lower  respiratory specimens dur ing the acute phase of infection.  Positive  results are indicative of active infection with SARS-CoV-2.  Clinical  correlation with patient history and other diagnostic information is  necessary to determine patient infection status.  Positive results do  not rule out bacterial infection or co-infection with other viruses. If result is PRESUMPTIVE POSTIVE SARS-CoV-2 nucleic acids MAY BE PRESENT.   A presumptive positive result was obtained on the submitted specimen  and confirmed on repeat testing.  While 2019 novel coronavirus  (SARS-CoV-2) nucleic acids may be present in the submitted sample  additional confirmatory testing may be necessary for epidemiological  and / or clinical management purposes  to differentiate between  SARS-CoV-2 and other Sarbecovirus currently known to infect humans.  If clinically indicated additional testing with an alternate test  methodology [phone])  is advised. The SARS-CoV-2 RNA is generally  detectable in upper and lower respiratory sp ecimens during the acute  phase of infection. The expected result is Negative. Fact Sheet for Patients:   StrictlyIdeas.no Fact Sheet for Healthcare Providers: BankingDealers.co.za This test is not yet approved or cleared by the [place] FDA and has been authorized for detection and/or diagnosis of SARS-CoV-2 by FDA under an Emergency Use Authorization (EUA).  This EUA will remain in effect (meaning this test can be used) for the duration of the COVID-19 declaration under Section 564(b)(1) of the Act, 21 U.S.C. section 360bbb-3(b)(1), unless the authorization is terminated or revoked sooner. Performed at [facility], [place] [address], [place], [place] [address]   Urine culture     Status: Abnormal   Collection Time: 01/21/19 11:08 PM   Specimen: Urine, Clean Catch  Result Value Ref Range Status   Specimen Description URINE, CLEAN CATCH  Final   Special Requests NONE  Final   Culture (A)  Final    <10,000 COLONIES/mL INSIGNIFICANT GROWTH Performed at [facility], [place] [address] [place], [place] [address]    Report Status 01/23/2019 FINAL  Final  Culture, Urine     Status: None   Collection Time: 01/24/19 11:04 AM   Specimen: Urine, Catheterized  Result Value Ref Range Status   Specimen Description URINE, CATHETERIZED  Final   Special Requests Normal  Final   Culture   Final    NO GROWTH Performed at [facility] [address] [place], [place] [address]    Report Status 01/25/2019 FINAL  Final     Time coordinating discharge: Over 30 minutes  SIGNED:   Eric J [place] DO  [facility] 01/25/2019, 11:11 AM

## 2019-01-27 ENCOUNTER — Other Ambulatory Visit: Payer: Self-pay | Admitting: Family Medicine

## 2019-01-27 ENCOUNTER — Telehealth: Payer: Self-pay | Admitting: Behavioral Health

## 2019-01-27 NOTE — Telephone Encounter (Signed)
Transition Care Management Follow-up Telephone Call  PCP: Lucille Passy, MD  Admit date: 01/21/2019 Discharge date: 01/25/2019  Admitted From: Home Disposition: Home  Recommendations for Outpatient Follow-up:  1. Follow up with PCP in 1 week 2. Follow-up with cardiology, Dr. Lovena Le as scheduled 3. Newly started on Eliquis, Cardizem, metoprolol for A. fib with RVR  Home Health: Yes, PT/RN Equipment/Devices: None  Discharge Condition: Stable   How have you been since you were released from the hospital? Patient stated, "I feel good".   Do you understand why you were in the hospital? yes   Do you understand the discharge instructions? yes   Where were you discharged to? Home with daughter Candice.   Items Reviewed:  Medications reviewed: yes  Allergies reviewed: yes  Dietary changes reviewed: yes, heart healthy diet.  Referrals reviewed: yes, Follow up with PCP in 1 week; Follow-up with cardiology; PT come to home on tomorrow.   Functional Questionnaire:   Activities of Daily Living (ADLs):   She states they are independent in the following: ambulation, bathing and hygiene, feeding, continence, grooming, toileting and dressing States they require assistance with the following: None   Any transportation issues/concerns?: no   Any patient concerns? yes, per the daughter [facility]), she notice that the patient tends to cough more at night, but is still taking antibiotics and using albuterol inhaler.   Confirmed importance and date/time of follow-up visits scheduled yes, 01/31/19 at 10:40 AM.  Provider Appointment booked with Dr. Deborra Medina.  Confirmed with patient if condition begins to worsen call PCP or go to the ER.  Patient was given the office number and encouraged to call back with question or concerns.  : yes

## 2019-01-31 ENCOUNTER — Inpatient Hospital Stay: Payer: PPO | Admitting: Family Medicine

## 2019-01-31 DIAGNOSIS — Z8701 Personal history of pneumonia (recurrent): Secondary | ICD-10-CM | POA: Diagnosis not present

## 2019-01-31 DIAGNOSIS — Z9181 History of falling: Secondary | ICD-10-CM | POA: Diagnosis not present

## 2019-01-31 DIAGNOSIS — F329 Major depressive disorder, single episode, unspecified: Secondary | ICD-10-CM | POA: Diagnosis not present

## 2019-01-31 DIAGNOSIS — J441 Chronic obstructive pulmonary disease with (acute) exacerbation: Secondary | ICD-10-CM | POA: Diagnosis not present

## 2019-01-31 DIAGNOSIS — C50412 Malignant neoplasm of upper-outer quadrant of left female breast: Secondary | ICD-10-CM | POA: Diagnosis not present

## 2019-01-31 DIAGNOSIS — H52209 Unspecified astigmatism, unspecified eye: Secondary | ICD-10-CM | POA: Diagnosis not present

## 2019-01-31 DIAGNOSIS — M1990 Unspecified osteoarthritis, unspecified site: Secondary | ICD-10-CM | POA: Diagnosis not present

## 2019-01-31 DIAGNOSIS — Z7901 Long term (current) use of anticoagulants: Secondary | ICD-10-CM | POA: Diagnosis not present

## 2019-01-31 DIAGNOSIS — H905 Unspecified sensorineural hearing loss: Secondary | ICD-10-CM | POA: Diagnosis not present

## 2019-01-31 DIAGNOSIS — I129 Hypertensive chronic kidney disease with stage 1 through stage 4 chronic kidney disease, or unspecified chronic kidney disease: Secondary | ICD-10-CM | POA: Diagnosis not present

## 2019-01-31 DIAGNOSIS — M810 Age-related osteoporosis without current pathological fracture: Secondary | ICD-10-CM | POA: Diagnosis not present

## 2019-01-31 DIAGNOSIS — I4891 Unspecified atrial fibrillation: Secondary | ICD-10-CM | POA: Diagnosis not present

## 2019-01-31 DIAGNOSIS — F17210 Nicotine dependence, cigarettes, uncomplicated: Secondary | ICD-10-CM | POA: Diagnosis not present

## 2019-01-31 DIAGNOSIS — J309 Allergic rhinitis, unspecified: Secondary | ICD-10-CM | POA: Diagnosis not present

## 2019-01-31 DIAGNOSIS — F4323 Adjustment disorder with mixed anxiety and depressed mood: Secondary | ICD-10-CM | POA: Diagnosis not present

## 2019-01-31 DIAGNOSIS — F419 Anxiety disorder, unspecified: Secondary | ICD-10-CM | POA: Diagnosis not present

## 2019-01-31 DIAGNOSIS — N183 Chronic kidney disease, stage 3 (moderate): Secondary | ICD-10-CM | POA: Diagnosis not present

## 2019-02-03 ENCOUNTER — Other Ambulatory Visit: Payer: Self-pay

## 2019-02-03 ENCOUNTER — Encounter (HOSPITAL_COMMUNITY): Payer: Self-pay | Admitting: Physician Assistant

## 2019-02-03 ENCOUNTER — Ambulatory Visit (HOSPITAL_COMMUNITY)
Admission: RE | Admit: 2019-02-03 | Discharge: 2019-02-03 | Disposition: A | Discharge status: Home / Self Care | Payer: PPO | Source: Ambulatory Visit | Attending: Physician Assistant | Admitting: Physician Assistant

## 2019-02-03 VITALS — BP 98/42 | HR 81 | Ht 65.0 in | Wt 111.0 lb

## 2019-02-03 DIAGNOSIS — I480 Paroxysmal atrial fibrillation: Secondary | ICD-10-CM | POA: Diagnosis not present

## 2019-02-03 DIAGNOSIS — Z79899 Other long term (current) drug therapy: Secondary | ICD-10-CM | POA: Diagnosis not present

## 2019-02-03 DIAGNOSIS — I10 Essential (primary) hypertension: Secondary | ICD-10-CM | POA: Insufficient documentation

## 2019-02-03 DIAGNOSIS — I340 Nonrheumatic mitral (valve) insufficiency: Secondary | ICD-10-CM | POA: Insufficient documentation

## 2019-02-03 DIAGNOSIS — F17210 Nicotine dependence, cigarettes, uncomplicated: Secondary | ICD-10-CM | POA: Insufficient documentation

## 2019-02-03 DIAGNOSIS — Z7901 Long term (current) use of anticoagulants: Secondary | ICD-10-CM | POA: Diagnosis not present

## 2019-02-03 DIAGNOSIS — J449 Chronic obstructive pulmonary disease, unspecified: Secondary | ICD-10-CM | POA: Diagnosis not present

## 2019-02-03 NOTE — Patient Instructions (Addendum)
Your physician has recommended you make the following change in your medication:   Stop your Metoprolol DECREASE Eliquis to 2.5 mg, this is one half of your current dose  Dr. Tanna Furry office will contact you for an appt.

## 2019-02-03 NOTE — Progress Notes (Signed)
Primary Care Physician: Lucille Passy, MD Primary Electrophysiologist: Dr Lovena Le Referring Physician: Dr Madalyn Rob is a 78 y.o. female with a history of COPD, tobacco use, anxiety, hypertension, and paroxysmal atrial fibrillation who presents for consultation in the [facility].  The patient was initially diagnosed with atrial fibrillation on 01/21/19 after presenting with symptoms of fatigue and hypotension and was found to be in afib with RVR. She was admitted and converted to SR on diltiazem. She was started on daily diltiazem and Eliquis. Of note, she was also treated for community acquired pneumonia during her hospitalization. Since discharge, she reports that she is doing better with no heart racing symptoms. Her daughter who accompanies her today, states that patient does become dizzy on standing. This has been chronic for years. Patient denies any chest pain and reports her SOB is at baseline.   Today, she denies symptoms of palpitations, chest pain, orthopnea, PND, lower extremity edema, dizziness, presyncope, syncope, snoring, daytime somnolence, bleeding, or neurologic sequela. The patient is tolerating medications without difficulties and is otherwise without complaint today.    Atrial Fibrillation Risk Factors:  she does not have symptoms or diagnosis of sleep apnea. she does not have a history of alcohol use.   she has a BMI of Body mass index is 18.47 kg/m.Marland Kitchen Filed Weights   02/03/19 1343  Weight: 50.3 kg    No family history on file.   Atrial Fibrillation Management history:  Previous antiarrhythmic drugs: none Previous cardioversions: none Previous ablations: none CHADS2VASC score: 4 Anticoagulation history: Eliquis   Past Medical History:  Diagnosis Date  . Anxiety   . Arthritis   . Asthma   . COPD (chronic obstructive pulmonary disease) ([place])   . HOH (hard of hearing)   . Hypertension   . Wears dentures    top  and partial bottom  . Wears glasses    Past Surgical History:  Procedure Laterality Date  . APPENDECTOMY    . BREAST LUMPECTOMY WITH NEEDLE LOCALIZATION Right 04/21/2013   Procedure: BREAST LUMPECTOMY WITH NEEDLE LOCALIZATION;  Surgeon: Harl Bowie, MD;  Location: [facility];  Service: General;  Laterality: Right;  . BREAST SURGERY  1985   cyst rt br  . MASTECTOMY W/ SENTINEL NODE BIOPSY Left 04/21/2013   Procedure: MASTECTOMY WITH SENTINEL LYMPH NODE BIOPSY;  Surgeon: Harl Bowie, MD;  Location: [facility];  Service: General;  Laterality: Left;  . OTHER SURGICAL HISTORY     cysts removed from foot  . OTHER SURGICAL HISTORY     cysts removed from breast  . TUBAL LIGATION      Current Outpatient Medications  Medication Sig Dispense Refill  . ADVAIR DISKUS 100-50 MCG/DOSE AEPB TAKE 1 PUFF TWICE A DAY AS DIRECTED RINSE MOUTH AFTER USE (Patient taking differently: Inhale 1 puff into the lungs 2 (two) times a day. RINSE MOUTH AFTER USE) 60 each 5  . apixaban (ELIQUIS) 5 MG TABS tablet Take 1 tablet (5 mg total) by mouth 2 (two) times daily. 180 tablet 0  . busPIRone (BUSPAR) 15 MG tablet TAKE ONE TABLET TWICE DAILY 30 tablet 3  . Calcium-Magnesium (CAL/MAG PO) Take 1 tablet by mouth 3 (three) times a week.    . Cholecalciferol (VITAMIN D3) 50 MCG (2000 UT) TABS Take 2,000 Units by mouth 3 (three) times a week.     . diltiazem (CARDIZEM CD) 180 MG 24 hr capsule Take 1  capsule (180 mg total) by mouth daily. 90 capsule 0  . fexofenadine (ALLEGRA) 180 MG tablet Take 180 mg by mouth daily.     Marland Kitchen lactose free nutrition (BOOST) LIQD Take 237 mLs by mouth 2 (two) times daily between meals.    Marland Kitchen losartan (COZAAR) 25 MG tablet Take 0.5 tablets (12.5 mg total) by mouth 2 (two) times daily. 30 tablet 3  . metoprolol succinate (TOPROL-XL) 25 MG 24 hr tablet Take 1 tablet (25 mg total) by mouth daily. 90 tablet 0  . mirtazapine (REMERON) 15 MG tablet Take 1  tablet (15 mg total) by mouth at bedtime. 90 tablet 3  . Misc Natural Products (TART CHERRY ADVANCED) CAPS Take 1-2 capsules by mouth 3 (three) times a week.    . Multiple Vitamins-Minerals (CENTRUM SILVER 50+WOMEN) TABS Take 1 tablet by mouth 3 (three) times a week.    . Omega-3 Fatty Acids (OMEGA 3 PO) Take 1 capsule by mouth daily.     Marland Kitchen PROAIR HFA 108 (90 Base) MCG/ACT inhaler INHALE 1-2 PUFFS INTO THE LUNGS EVERY 4 HOURS AS NEEDED (Patient taking differently: Inhale 1-2 puffs into the lungs every 4 (four) hours as needed for wheezing or shortness of breath. ) 8.5 g 3  . 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 vitamin C (ASCORBIC ACID) 500 MG tablet Take 500-1,000 mg by mouth 3 (three) times a week.     No current facility-administered medications for this encounter.     No Known Allergies  Social History   Socioeconomic History  . Marital status: Married    Spouse name: Not on file  . Number of children: Not on file  . Years of education: Not on file  . Highest education level: Not on file  Occupational History  . Occupation: retired    Fish farm manager: RETIRED  Social Needs  . Financial resource strain: Not on file  . Food insecurity    Worry: Not on file    Inability: Not on file  . Transportation needs    Medical: Not on file    Non-medical: Not on file  Tobacco Use  . Smoking status: Current Some Day Smoker    Packs/day: 0.50    Types: Cigarettes  . Smokeless tobacco: Never Used  Substance and Sexual Activity  . Alcohol use: No  . Drug use: No  . Sexual activity: Not on file    Comment: 1-2 daily  Lifestyle  . Physical activity    Days per week: Not on file    Minutes per session: Not on file  . Stress: Not on file  Relationships  . Social Herbalist on phone: Not on file    Gets together: Not on file    Attends religious service: Not on file    Active member of club or organization: Not on  file    Attends meetings of clubs or organizations: Not on file    Relationship status: Not on file  . Intimate partner violence    Fear of current or ex partner: Not on file    Emotionally abused: Not on file    Physically abused: Not on file    Forced sexual activity: Not on file  Other Topics Concern  . Not on file  Social History Narrative   Has a living will     ROS- All systems are reviewed and negative except as per the HPI  above.  Physical Exam: Vitals:   02/03/19 1343  BP: (!) 98/42  Pulse: 81  Weight: 50.3 kg  Height: 5\' 5"  (1.651 m)    GEN- The patient is well appearing elderly female, alert and oriented x 3 today.   Head- normocephalic, atraumatic Eyes-  Sclera clear, conjunctiva pink Ears- hearing intact Oropharynx- clear Neck- supple  Lungs- Clear to ausculation bilaterally, diminished breath sounds, normal work of breathing Heart- Regular rate and rhythm, no murmurs, rubs or gallops  GI- soft, NT, ND, + BS Extremities- no clubbing, cyanosis, or edema MS- no significant deformity or atrophy Skin- no rash or lesion Psych- euthymic mood, full affect Neuro- strength and sensation are intact  Wt Readings from Last 3 Encounters:  02/03/19 50.3 kg  01/25/19 52 kg  12/26/18 50.1 kg    EKG today demonstrates SR HR 81, PR 170, QRS 88, QTc 462  Echo 01/22/19 demonstrated  1. The left ventricle has normal systolic function with an ejection fraction of 60-65%. The cavity size was normal. There is moderate concentric left ventricular hypertrophy. Left ventricular diastolic Doppler parameters are consistent with  pseudonormalization. Elevated left atrial and left ventricular end-diastolic pressures. 2. The right ventricle has normal systolic function. The cavity was normal. There is no increase in right ventricular wall thickness. Right ventricular systolic pressure is moderately elevated. 3. Mitral valve regurgitation is moderate by color flow Doppler. 4. The  aortic valve is tricuspid. Moderate thickening of the aortic valve. Moderate calcification of the aortic valve. Aortic valve regurgitation was not assessed by color flow Doppler. 5. The inferior vena cava was dilated in size with <50% respiratory variability.  Epic records are reviewed at length today  Assessment and Plan:  1. Paroxysmal atrial fibrillation The patient has new onset paroxysmal atrial fibrillation. ? In setting of CAP. General education about afib provided and questions answered.  Appears to be maintaining SR. Continue diltiazem 180 mg daily Will stop Toprol 25 mg daily given hypotension and dizziness. We discussed her stroke risk as well as the risks and benefits of anticoagulation.  Will decrease her Eliquis to 2.5 mg BID as she will turn 78 y/o in a couple weeks and she weighs <60kgs.  This patients CHA2DS2-VASc Score and unadjusted Ischemic Stroke Rate (% per year) is equal to 4.8 % stroke rate/year from a score of 4  Above score calculated as 1 point each if present [CHF, HTN, DM, Vascular=MI/PAD/Aortic Plaque, Age if 65-74, or Female] Above score calculated as 2 points each if present [Age > 75, or Stroke/TIA/TE]   2. HTN Hypotensive today. Med changes as above. Encouraged a healthy diet with adequate protein and nutritional supplements and adequate hydration. Also encouraged her to dangle for a couple minutes prior to standing.   3. Mitral regurgitation Moderate on echo.     Follow up with Dr Lovena Le in 2 months.   [facility] [address] [place], [place] [address] [phone] 02/03/2019 2:36 PM

## 2019-02-16 ENCOUNTER — Encounter: Payer: Self-pay | Admitting: Family Medicine

## 2019-02-16 ENCOUNTER — Ambulatory Visit (INDEPENDENT_AMBULATORY_CARE_PROVIDER_SITE_OTHER): Payer: PPO | Admitting: Family Medicine

## 2019-02-16 ENCOUNTER — Telehealth (HOSPITAL_COMMUNITY): Payer: Self-pay | Admitting: *Deleted

## 2019-02-16 DIAGNOSIS — Z09 Encounter for follow-up examination after completed treatment for conditions other than malignant neoplasm: Secondary | ICD-10-CM

## 2019-02-16 DIAGNOSIS — J40 Bronchitis, not specified as acute or chronic: Secondary | ICD-10-CM

## 2019-02-16 MED ORDER — PREDNISONE 10 MG PO TABS: 10.0000 mg | ORAL_TABLET | Freq: Every day | ORAL | 0 refills | Status: AC

## 2019-02-16 MED ORDER — LEVOFLOXACIN 500 MG PO TABS: 500.0000 mg | ORAL_TABLET | Freq: Every day | ORAL | 0 refills | Status: AC

## 2019-02-16 NOTE — Progress Notes (Signed)
Established Patient Office Visit  Subjective:  Patient ID: Bianca [name], female    DOB: [birth year]  Age: 78 y.o. MRN: [id number]  CC:  Chief Complaint  Patient presents with  . Follow-up    dx'd with pna on 01/21/2019, covid test negative. still having rattling in her chest.    HPI Bianca [name] presents for follow-up of hospitalization on 7 4 and discharged on 7/8.  Patient is doing relatively well.  She is felt well and been afebrile.  Denies any alteration in her sense of taste or smell.  She continues to cough that is occasionally productive of phlegm.  Continues to wheeze some with increased albuterol use since discharge.  Past Medical History:  Diagnosis Date  . Anxiety   . Arthritis   . Asthma   . COPD (chronic obstructive pulmonary disease) ([place])   . HOH (hard of hearing)   . Hypertension   . Wears dentures    top and partial bottom  . Wears glasses     Past Surgical History:  Procedure Laterality Date  . APPENDECTOMY    . BREAST LUMPECTOMY WITH NEEDLE LOCALIZATION Right 04/21/2013   Procedure: BREAST LUMPECTOMY WITH NEEDLE LOCALIZATION;  Surgeon: Harl Bowie, MD;  Location: [facility];  Service: General;  Laterality: Right;  . BREAST SURGERY  1985   cyst rt br  . MASTECTOMY W/ SENTINEL NODE BIOPSY Left 04/21/2013   Procedure: MASTECTOMY WITH SENTINEL LYMPH NODE BIOPSY;  Surgeon: Harl Bowie, MD;  Location: [facility];  Service: General;  Laterality: Left;  . OTHER SURGICAL HISTORY     cysts removed from foot  . OTHER SURGICAL HISTORY     cysts removed from breast  . TUBAL LIGATION      History reviewed. No pertinent family history.  Social History   Socioeconomic History  . Marital status: Married    Spouse name: Not on file  . Number of children: Not on file  . Years of education: Not on file  . Highest education level: Not on file  Occupational History  . Occupation: retired    Fish farm manager: RETIRED  Social  Needs  . Financial resource strain: Not on file  . Food insecurity    Worry: Not on file    Inability: Not on file  . Transportation needs    Medical: Not on file    Non-medical: Not on file  Tobacco Use  . Smoking status: Current Some Day Smoker    Packs/day: 0.50    Types: Cigarettes  . Smokeless tobacco: Never Used  Substance and Sexual Activity  . Alcohol use: No  . Drug use: No  . Sexual activity: Not on file    Comment: 1-2 daily  Lifestyle  . Physical activity    Days per week: Not on file    Minutes per session: Not on file  . Stress: Not on file  Relationships  . Social Herbalist on phone: Not on file    Gets together: Not on file    Attends religious service: Not on file    Active member of club or organization: Not on file    Attends meetings of clubs or organizations: Not on file    Relationship status: Not on file  . Intimate partner violence    Fear of current or ex partner: Not on file    Emotionally abused: Not on file    Physically abused: Not on file  Forced sexual activity: Not on file  Other Topics Concern  . Not on file  Social History Narrative   Has a living will    Outpatient Medications Prior to Visit  Medication Sig Dispense Refill  . ADVAIR DISKUS 100-50 MCG/DOSE AEPB TAKE 1 PUFF TWICE A DAY AS DIRECTED RINSE MOUTH AFTER USE (Patient taking differently: Inhale 1 puff into the lungs 2 (two) times a day. RINSE MOUTH AFTER USE) 60 each 5  . apixaban (ELIQUIS) 5 MG TABS tablet Take 1 tablet (5 mg total) by mouth 2 (two) times daily. 180 tablet 0  . busPIRone (BUSPAR) 15 MG tablet TAKE ONE TABLET TWICE DAILY 30 tablet 3  . Calcium-Magnesium (CAL/MAG PO) Take 1 tablet by mouth 3 (three) times a week.    . Cholecalciferol (VITAMIN D3) 50 MCG (2000 UT) TABS Take 2,000 Units by mouth 3 (three) times a week.     . diltiazem (CARDIZEM CD) 180 MG 24 hr capsule Take 1 capsule (180 mg total) by mouth daily. 90 capsule 0  . fexofenadine  (ALLEGRA) 180 MG tablet Take 180 mg by mouth daily.     Marland Kitchen lactose free nutrition (BOOST) LIQD Take 237 mLs by mouth 2 (two) times daily between meals.    Marland Kitchen losartan (COZAAR) 25 MG tablet Take 0.5 tablets (12.5 mg total) by mouth 2 (two) times daily. 30 tablet 3  . metoprolol succinate (TOPROL-XL) 25 MG 24 hr tablet Take 1 tablet (25 mg total) by mouth daily. 90 tablet 0  . mirtazapine (REMERON) 15 MG tablet Take 1 tablet (15 mg total) by mouth at bedtime. 90 tablet 3  . Misc Natural Products (TART CHERRY ADVANCED) CAPS Take 1-2 capsules by mouth 3 (three) times a week.    . Multiple Vitamins-Minerals (CENTRUM SILVER 50+WOMEN) TABS Take 1 tablet by mouth 3 (three) times a week.    . Omega-3 Fatty Acids (OMEGA 3 PO) Take 1 capsule by mouth daily.     Marland Kitchen PROAIR HFA 108 (90 Base) MCG/ACT inhaler INHALE 1-2 PUFFS INTO THE LUNGS EVERY 4 HOURS AS NEEDED (Patient taking differently: Inhale 1-2 puffs into the lungs every 4 (four) hours as needed for wheezing or shortness of breath. ) 8.5 g 3  . 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 vitamin C (ASCORBIC ACID) 500 MG tablet Take 500-1,000 mg by mouth 3 (three) times a week.     No facility-administered medications prior to visit.     No Known Allergies  ROS Review of Systems  Constitutional: Negative for chills, diaphoresis, fatigue, fever and unexpected weight change.  HENT: Negative.   Eyes: Negative for photophobia and visual disturbance.  Respiratory: Positive for wheezing. Negative for cough, chest tightness and shortness of breath.   Cardiovascular: Negative for chest pain and palpitations.  Gastrointestinal: Negative.   Genitourinary: Negative.   Musculoskeletal: Negative for joint swelling and myalgias.  Skin: Negative for pallor and rash.  Neurological: Negative for light-headedness and headaches.  Psychiatric/Behavioral: Negative.       Objective:    Physical Exam   Constitutional: She is oriented to person, place, and time. She appears well-developed and well-nourished. No distress.  HENT:  Head: Normocephalic and atraumatic.  Right Ear: External ear normal.  Left Ear: External ear normal.  Eyes: Conjunctivae are normal. Right eye exhibits no discharge. Left eye exhibits no discharge. No scleral icterus.  Neck: No JVD present. No tracheal deviation present.  Pulmonary/Chest:  Effort normal. No stridor.  Neurological: She is alert and oriented to person, place, and time.  Skin: Skin is warm and dry. She is not diaphoretic.  Psychiatric: She has a normal mood and affect. Her behavior is normal.    There were no vitals taken for this visit. Wt Readings from Last 3 Encounters:  02/03/19 111 lb (50.3 kg)  01/25/19 114 lb 10.2 oz (52 kg)  12/26/18 110 lb 6.4 oz (50.1 kg)   BP Readings from Last 3 Encounters:  02/03/19 (!) 98/42  01/25/19 (!) 151/96  12/26/18 130/72   Guideline developer:  UpToDate (see UpToDate for funding source) Date Released: June 2014  Health Maintenance Due  Topic Date Due  . Samul Dada  12/26/2018    There are no preventive care reminders to display for this patient.  Lab Results  Component Value Date   TSH 3.393 01/21/2019   Lab Results  Component Value Date   WBC 12.2 (H) 01/25/2019   HGB 11.4 (L) 01/25/2019   HCT 34.0 (L) 01/25/2019   MCV 87.9 01/25/2019   PLT 246 01/25/2019   Lab Results  Component Value Date   NA 138 01/25/2019   K 3.4 (L) 01/25/2019   CHLORIDE 102 03/08/2013   CO2 19 (L) 01/25/2019   GLUCOSE 112 (H) 01/25/2019   BUN 14 01/25/2019   CREATININE 1.29 (H) 01/25/2019   BILITOT 0.8 01/25/2019   ALKPHOS 56 01/25/2019   AST 41 01/25/2019   ALT 26 01/25/2019   PROT 5.7 (L) 01/25/2019   ALBUMIN 2.8 (L) 01/25/2019   CALCIUM 7.8 (L) 01/25/2019   ANIONGAP 10 01/25/2019   GFR 33.17 (L) 12/26/2018   Lab Results  Component Value Date   CHOL 130 01/22/2019   Lab Results  Component  Value Date   HDL 45 01/22/2019   Lab Results  Component Value Date   LDLCALC 68 01/22/2019   Lab Results  Component Value Date   TRIG 86 01/22/2019   Lab Results  Component Value Date   CHOLHDL 2.9 01/22/2019   No results found for: HGBA1C    Assessment & Plan:   Problem List Items Addressed This Visit    None    Visit Diagnoses    Hospital discharge follow-up    -  Primary   Bronchitis       Relevant Medications   levofloxacin (LEVAQUIN) 500 MG tablet   predniSONE (DELTASONE) 10 MG tablet      Meds ordered this encounter  Medications  . levofloxacin (LEVAQUIN) 500 MG tablet    Sig: Take 1 tablet (500 mg total) by mouth daily for 4 days.    Dispense:  4 tablet    Refill:  0  . predniSONE (DELTASONE) 10 MG tablet    Sig: Take 1 tablet (10 mg total) by mouth daily with breakfast for 7 days.    Dispense:  7 tablet    Refill:  0    Follow-up: Return in about 1 week (around 02/23/2019), or if symptoms worsen or fail to improve.   Virtual Visit via Video Note  I connected with Bianca [name] on 02/16/19 at  9:30 AM EDT by a video enabled telemedicine application and verified that I am speaking with the correct person using two identifiers.  Location: Patient: home Provider:    I discussed the limitations of evaluation and management by telemedicine and the availability of in person appointments. The patient expressed understanding and agreed to proceed.  History of Present Illness:  Observations/Objective:   Assessment and Plan:   Follow Up Instructions:    I discussed the assessment and treatment plan with the patient. The patient was provided an opportunity to ask questions and all were answered. The patient agreed with the plan and demonstrated an understanding of the instructions.   The patient was advised to call back or seek an in-person evaluation if the symptoms worsen or if the condition fails to improve as anticipated.  I provided 25  minutes of non-face-to-face time during this encounter.   Libby Maw, MD

## 2019-02-16 NOTE — Telephone Encounter (Addendum)
Tillie Rung, Physical Therapist working with patient at her home today called and left a message stating while working with her during therapy today she had 2 presyncopal episodes after 8 minutes of exercise. Her BP was in the 140/90 range prior to beginning therapy - patient became very weak unable to control her movements and nearly unresponsive - pt did not lose consciousness according the therapist. Her BP with this episode was 110/60 HR 94. After about a minute of sitting her BP normalized. Therapy wanted to the episode documented and stated her "weak/dizzy" spells are what prevent therapy from being productive. Tillie Rung can be reached at [phone].  Will forward to see if further treatment changes are warranted.

## 2019-02-16 NOTE — Telephone Encounter (Signed)
Per Adline Peals PA pt will need to follow up with PCP office as dizziness is a chronic issue for her.

## 2019-02-17 ENCOUNTER — Other Ambulatory Visit: Payer: Self-pay

## 2019-02-21 ENCOUNTER — Telehealth: Payer: Self-pay | Admitting: Internal Medicine

## 2019-02-21 DIAGNOSIS — Z9181 History of falling: Secondary | ICD-10-CM | POA: Diagnosis not present

## 2019-02-21 DIAGNOSIS — Z7901 Long term (current) use of anticoagulants: Secondary | ICD-10-CM | POA: Diagnosis not present

## 2019-02-21 DIAGNOSIS — F329 Major depressive disorder, single episode, unspecified: Secondary | ICD-10-CM | POA: Diagnosis not present

## 2019-02-21 DIAGNOSIS — H905 Unspecified sensorineural hearing loss: Secondary | ICD-10-CM | POA: Diagnosis not present

## 2019-02-21 DIAGNOSIS — F4323 Adjustment disorder with mixed anxiety and depressed mood: Secondary | ICD-10-CM | POA: Diagnosis not present

## 2019-02-21 DIAGNOSIS — F419 Anxiety disorder, unspecified: Secondary | ICD-10-CM | POA: Diagnosis not present

## 2019-02-21 DIAGNOSIS — H52209 Unspecified astigmatism, unspecified eye: Secondary | ICD-10-CM | POA: Diagnosis not present

## 2019-02-21 DIAGNOSIS — C50412 Malignant neoplasm of upper-outer quadrant of left female breast: Secondary | ICD-10-CM | POA: Diagnosis not present

## 2019-02-21 DIAGNOSIS — M1990 Unspecified osteoarthritis, unspecified site: Secondary | ICD-10-CM | POA: Diagnosis not present

## 2019-02-21 DIAGNOSIS — F17210 Nicotine dependence, cigarettes, uncomplicated: Secondary | ICD-10-CM | POA: Diagnosis not present

## 2019-02-21 DIAGNOSIS — M810 Age-related osteoporosis without current pathological fracture: Secondary | ICD-10-CM | POA: Diagnosis not present

## 2019-02-21 DIAGNOSIS — I129 Hypertensive chronic kidney disease with stage 1 through stage 4 chronic kidney disease, or unspecified chronic kidney disease: Secondary | ICD-10-CM | POA: Diagnosis not present

## 2019-02-21 DIAGNOSIS — J309 Allergic rhinitis, unspecified: Secondary | ICD-10-CM | POA: Diagnosis not present

## 2019-02-21 DIAGNOSIS — Z8701 Personal history of pneumonia (recurrent): Secondary | ICD-10-CM | POA: Diagnosis not present

## 2019-02-21 DIAGNOSIS — I4891 Unspecified atrial fibrillation: Secondary | ICD-10-CM | POA: Diagnosis not present

## 2019-02-21 DIAGNOSIS — N183 Chronic kidney disease, stage 3 (moderate): Secondary | ICD-10-CM | POA: Diagnosis not present

## 2019-02-21 DIAGNOSIS — J441 Chronic obstructive pulmonary disease with (acute) exacerbation: Secondary | ICD-10-CM | POA: Diagnosis not present

## 2019-02-21 NOTE — Telephone Encounter (Signed)
New Message     Bianca [name] is calling and says the pts  BP is dropping when she stands up She is having presyncope episodes almost daily    Please call

## 2019-02-21 NOTE — Telephone Encounter (Signed)
lpmtcb 8/4

## 2019-02-24 NOTE — Telephone Encounter (Signed)
Sent message to Pt's son via Deloris Ping advising can move up Pt's upcoming appt if they would like.

## 2019-03-01 ENCOUNTER — Telehealth: Payer: Self-pay | Admitting: Family Medicine

## 2019-03-01 NOTE — Telephone Encounter (Signed)
I called patient to schedule a follow-up with Dr. Deborra Medina for medication follow up. Patient declined appointment at this time and stated that she will call back.

## 2019-03-16 ENCOUNTER — Other Ambulatory Visit: Payer: Self-pay

## 2019-03-16 ENCOUNTER — Ambulatory Visit (INDEPENDENT_AMBULATORY_CARE_PROVIDER_SITE_OTHER): Payer: PPO | Admitting: Internal Medicine

## 2019-03-16 ENCOUNTER — Encounter: Payer: Self-pay | Admitting: Internal Medicine

## 2019-03-16 VITALS — BP 132/68 | HR 77 | Ht 65.5 in | Wt 107.8 lb

## 2019-03-16 DIAGNOSIS — I340 Nonrheumatic mitral (valve) insufficiency: Secondary | ICD-10-CM | POA: Diagnosis not present

## 2019-03-16 DIAGNOSIS — I480 Paroxysmal atrial fibrillation: Secondary | ICD-10-CM | POA: Insufficient documentation

## 2019-03-16 DIAGNOSIS — I10 Essential (primary) hypertension: Secondary | ICD-10-CM

## 2019-03-16 NOTE — Patient Instructions (Addendum)
Medication Instructions:  Your physician recommends that you continue on your current medications as directed. Please refer to the Current Medication list given to you today.  Labwork: None ordered.  Testing/Procedures: None ordered.  Follow-Up: Your physician wants you to follow-up in: 3 months with Dr. Lovena Le.      Any Other Special Instructions Will Be Listed Below (If Applicable).  If you need a refill on your cardiac medications before your next appointment, please call your pharmacy.

## 2019-03-16 NOTE — Progress Notes (Signed)
HPI [name] returns today for followup. She has PAF and autonomic dysfunction with fairly severe orthostasis which is long standing as is her h/o HTN. The patient has improved since she was in the hospital 5 weeks ago. She is still having some dizzy spells. She has not passed out but has to sit down sometimes when she stands up. She admits to not having much appetite and missing meals.  No Known Allergies   Current Outpatient Medications  Medication Sig Dispense Refill  . ADVAIR DISKUS 100-50 MCG/DOSE AEPB TAKE 1 PUFF TWICE A DAY AS DIRECTED RINSE MOUTH AFTER USE 60 each 5  . apixaban (ELIQUIS) 2.5 MG TABS tablet Take 2.5 mg by mouth 2 (two) times daily.    . busPIRone (BUSPAR) 15 MG tablet Take 15 mg by mouth daily.    . Calcium-Magnesium (CAL/MAG PO) Take 1 tablet by mouth 3 (three) times a week.    . Cholecalciferol (VITAMIN D3) 50 MCG (2000 UT) TABS Take 2,000 Units by mouth 3 (three) times a week.     . diltiazem (CARDIZEM CD) 180 MG 24 hr capsule Take 1 capsule (180 mg total) by mouth daily. 90 capsule 0  . fexofenadine (ALLEGRA) 180 MG tablet Take 180 mg by mouth daily.     Marland Kitchen lactose free nutrition (BOOST) LIQD Take 237 mLs by mouth 2 (two) times daily between meals.    . mirtazapine (REMERON) 15 MG tablet Take 1 tablet (15 mg total) by mouth at bedtime. 90 tablet 3  . Misc Natural Products (TART CHERRY ADVANCED) CAPS Take 1-2 capsules by mouth 3 (three) times a week.    . Multiple Vitamins-Minerals (CENTRUM SILVER 50+WOMEN) TABS Take 1 tablet by mouth 3 (three) times a week.    . Omega-3 Fatty Acids (OMEGA 3 PO) Take 1 capsule by mouth daily.     Marland Kitchen PROAIR HFA 108 (90 Base) MCG/ACT inhaler INHALE 1-2 PUFFS INTO THE LUNGS EVERY 4 HOURS AS NEEDED 8.5 g 3  . 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 vitamin C (ASCORBIC ACID) 500 MG tablet Take 500-1,000 mg by mouth 3 (three) times a week.     No current  facility-administered medications for this visit.      Past Medical History:  Diagnosis Date  . Anxiety   . Arthritis   . Asthma   . COPD (chronic obstructive pulmonary disease) ([place])   . HOH (hard of hearing)   . Hypertension   . Wears dentures    top and partial bottom  . Wears glasses     ROS:   All systems reviewed and negative except as noted in the HPI.   Past Surgical History:  Procedure Laterality Date  . APPENDECTOMY    . BREAST LUMPECTOMY WITH NEEDLE LOCALIZATION Right 04/21/2013   Procedure: BREAST LUMPECTOMY WITH NEEDLE LOCALIZATION;  Surgeon: Harl Bowie, MD;  Location: [facility];  Service: General;  Laterality: Right;  . BREAST SURGERY  1985   cyst rt br  . MASTECTOMY W/ SENTINEL NODE BIOPSY Left 04/21/2013   Procedure: MASTECTOMY WITH SENTINEL LYMPH NODE BIOPSY;  Surgeon: Harl Bowie, MD;  Location: [facility];  Service: General;  Laterality: Left;  . OTHER SURGICAL HISTORY     cysts removed from foot  . OTHER SURGICAL HISTORY     cysts removed from breast  . TUBAL LIGATION  History reviewed. No pertinent family history.   Social History   Socioeconomic History  . Marital status: Married    Spouse name: Not on file  . Number of children: Not on file  . Years of education: Not on file  . Highest education level: Not on file  Occupational History  . Occupation: retired    Fish farm manager: RETIRED  Social Needs  . Financial resource strain: Not on file  . Food insecurity    Worry: Not on file    Inability: Not on file  . Transportation needs    Medical: Not on file    Non-medical: Not on file  Tobacco Use  . Smoking status: Current Some Day Smoker    Packs/day: 0.50    Types: Cigarettes  . Smokeless tobacco: Never Used  Substance and Sexual Activity  . Alcohol use: No  . Drug use: No  . Sexual activity: Not on file    Comment: 1-2 daily  Lifestyle  . Physical activity    Days per week: Not on  file    Minutes per session: Not on file  . Stress: Not on file  Relationships  . Social Herbalist on phone: Not on file    Gets together: Not on file    Attends religious service: Not on file    Active member of club or organization: Not on file    Attends meetings of clubs or organizations: Not on file    Relationship status: Not on file  . Intimate partner violence    Fear of current or ex partner: Not on file    Emotionally abused: Not on file    Physically abused: Not on file    Forced sexual activity: Not on file  Other Topics Concern  . Not on file  Social History Narrative   Has a living will     BP 132/68   Pulse 77   Ht 5' 5.5" (1.664 m)   Wt 107 lb 12.8 oz (48.9 kg)   SpO2 98%   BMI 17.67 kg/m   Physical Exam:  Elderly, frail appearing NAD HEENT: Unremarkable Neck:  No JVD, no thyromegally Lymphatics:  No adenopathy Back:  No CVA tenderness Lungs:  Clear with no wheezes HEART:  Regular rate rhythm, no murmurs, no rubs, no clicks Abd:  soft, positive bowel sounds, no organomegally, no rebound, no guarding Ext:  2 plus pulses, no edema, no cyanosis, no clubbing Skin:  No rashes no nodules Neuro:  CN II through XII intact, motor grossly intact  Orthostatic vitals - her bp dropped 50 points from laying to standing.  Assess/Plan: 1. PAF - she appears to be maintaining NSR. She will continue her current meds. She will remain on low dose eliquis. 2. Orthostasis - this is her biggest problem. I have asked her not to miss any meals and eat more salt. Her daughter actually was carrying a bag of salt in her purse.  3. HTN - she has had this in the past. I recommended she be allowed to run her bp in the 150-160 range. 4. Coags - she has not had any bleeding on systemic anti-coagulation with Eliquis  Mordche Hedglin,M.D.

## 2019-03-28 ENCOUNTER — Telehealth: Payer: Self-pay | Admitting: General Practice

## 2019-03-28 NOTE — Telephone Encounter (Signed)
Please advise:   Bianca [name] home health nurse called to inform provider that patients BP is okay at rest but when standing it drops and is wanting to know what Dr. Deborra Medina would advise in putting parameters of her losartan. BP 108/50 after not taking dosage of losartan for today and would like a callback

## 2019-03-28 NOTE — Telephone Encounter (Signed)
She should defintitely hold it.  But what is more disconcerting is that I don't see losartan on her med list.  Do YOU?

## 2019-03-29 NOTE — Telephone Encounter (Signed)
Called Tillie Rung and left a detailed message to advise.

## 2019-03-29 NOTE — Telephone Encounter (Signed)
Review of pt chart shows that Losartan was stopped by cardiology on 03/16/19.Will call and inform pt should not be on medication.

## 2019-04-13 ENCOUNTER — Other Ambulatory Visit: Payer: Self-pay | Admitting: Family Medicine

## 2019-04-14 NOTE — Telephone Encounter (Signed)
Okay to refill? 

## 2019-04-17 ENCOUNTER — Ambulatory Visit: Payer: PPO | Admitting: Internal Medicine

## 2019-04-24 MED ORDER — DILTIAZEM HCL ER COATED BEADS 180 MG PO CP24: 180.0000 mg | ORAL_CAPSULE | Freq: Every day | ORAL | 3 refills | Status: DC

## 2019-05-03 ENCOUNTER — Other Ambulatory Visit: Payer: Self-pay | Admitting: Internal Medicine

## 2019-06-19 ENCOUNTER — Telehealth: Payer: Self-pay | Admitting: Internal Medicine

## 2019-06-19 ENCOUNTER — Encounter: Payer: Self-pay | Admitting: Internal Medicine

## 2019-06-19 ENCOUNTER — Ambulatory Visit: Payer: PPO | Admitting: Internal Medicine

## 2019-06-19 ENCOUNTER — Other Ambulatory Visit: Payer: Self-pay

## 2019-06-19 VITALS — BP 128/70 | HR 56 | Ht 65.5 in | Wt 112.4 lb

## 2019-06-19 DIAGNOSIS — I10 Essential (primary) hypertension: Secondary | ICD-10-CM | POA: Diagnosis not present

## 2019-06-19 DIAGNOSIS — I480 Paroxysmal atrial fibrillation: Secondary | ICD-10-CM | POA: Diagnosis not present

## 2019-06-19 DIAGNOSIS — I951 Orthostatic hypotension: Secondary | ICD-10-CM | POA: Insufficient documentation

## 2019-06-19 MED ORDER — MIDODRINE HCL 5 MG PO TABS: ORAL_TABLET | ORAL | 3 refills | Status: DC

## 2019-06-19 NOTE — Telephone Encounter (Signed)
New Message:     Daughter says she will need to come in today with pt for her appointment. Pt's blood pressure condition causes pt  to pass out sometimes.

## 2019-06-19 NOTE — Progress Notes (Signed)
HPI Bianca [name] returns today for followup of orthostasis. She has PAF and on systemic anti-coagulation. She also has HTN. We saw her last about 3 months ago and she has done better with no frank syncope. She has tried to eat more salt. She denies chest pain or sob. She has been taking cardizem and her bp has still been a little low. No Known Allergies   Current Outpatient Medications  Medication Sig Dispense Refill  . ADVAIR DISKUS 100-50 MCG/DOSE AEPB TAKE 1 PUFF TWICE A DAY AS DIRECTED RINSE MOUTH AFTER USE 60 each 5  . apixaban (ELIQUIS) 2.5 MG TABS tablet Take 2.5 mg by mouth 2 (two) times daily.    Marland Kitchen apixaban (ELIQUIS) 2.5 MG TABS tablet Take 1 tablet (2.5 mg total) by mouth 2 (two) times daily. 60 tablet 10  . busPIRone (BUSPAR) 15 MG tablet TAKE ONE TABLET BY MOUTH TWICE DAILY 30 tablet 3  . Calcium-Magnesium (CAL/MAG PO) Take 1 tablet by mouth 3 (three) times a week.    . Cholecalciferol (VITAMIN D3) 50 MCG (2000 UT) TABS Take 2,000 Units by mouth 3 (three) times a week.     . diltiazem (CARDIZEM CD) 180 MG 24 hr capsule Take 1 capsule (180 mg total) by mouth daily. 90 capsule 3  . fexofenadine (ALLEGRA) 180 MG tablet Take 180 mg by mouth daily.     Marland Kitchen lactose free nutrition (BOOST) LIQD Take 237 mLs by mouth 2 (two) times daily between meals.    . mirtazapine (REMERON) 15 MG tablet Take 1 tablet (15 mg total) by mouth at bedtime. 90 tablet 3  . Misc Natural Products (TART CHERRY ADVANCED) CAPS Take 1-2 capsules by mouth 3 (three) times a week.    . Multiple Vitamins-Minerals (CENTRUM SILVER 50+WOMEN) TABS Take 1 tablet by mouth 3 (three) times a week.    . Omega-3 Fatty Acids (OMEGA 3 PO) Take 1 capsule by mouth daily.     Marland Kitchen PROAIR HFA 108 (90 Base) MCG/ACT inhaler INHALE 1-2 PUFFS INTO THE LUNGS EVERY 4 HOURS AS NEEDED 8.5 g 3  . 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 vitamin C (ASCORBIC ACID) 500 MG tablet Take  500-1,000 mg by mouth 3 (three) times a week.     No current facility-administered medications for this visit.      Past Medical History:  Diagnosis Date  . Anxiety   . Arthritis   . Asthma   . COPD (chronic obstructive pulmonary disease) ([place])   . HOH (hard of hearing)   . Hypertension   . Wears dentures    top and partial bottom  . Wears glasses     ROS:   All systems reviewed and negative except as noted in the HPI.   Past Surgical History:  Procedure Laterality Date  . APPENDECTOMY    . BREAST LUMPECTOMY WITH NEEDLE LOCALIZATION Right 04/21/2013   Procedure: BREAST LUMPECTOMY WITH NEEDLE LOCALIZATION;  Surgeon: Harl Bowie, MD;  Location: [facility];  Service: General;  Laterality: Right;  . BREAST SURGERY  1985   cyst rt br  . MASTECTOMY W/ SENTINEL NODE BIOPSY Left 04/21/2013   Procedure: MASTECTOMY WITH SENTINEL LYMPH NODE BIOPSY;  Surgeon: Harl Bowie, MD;  Location: [facility];  Service: General;  Laterality: Left;  . OTHER SURGICAL HISTORY     cysts removed from foot  . OTHER SURGICAL  HISTORY     cysts removed from breast  . TUBAL LIGATION       History reviewed. No pertinent family history.   Social History   Socioeconomic History  . Marital status: Married    Spouse name: Not on file  . Number of children: Not on file  . Years of education: Not on file  . Highest education level: Not on file  Occupational History  . Occupation: retired    Fish farm manager: RETIRED  Social Needs  . Financial resource strain: Not on file  . Food insecurity    Worry: Not on file    Inability: Not on file  . Transportation needs    Medical: Not on file    Non-medical: Not on file  Tobacco Use  . Smoking status: Current Some Day Smoker    Packs/day: 0.50    Types: Cigarettes  . Smokeless tobacco: Never Used  Substance and Sexual Activity  . Alcohol use: No  . Drug use: No  . Sexual activity: Not on file    Comment: 1-2  daily  Lifestyle  . Physical activity    Days per week: Not on file    Minutes per session: Not on file  . Stress: Not on file  Relationships  . Social Herbalist on phone: Not on file    Gets together: Not on file    Attends religious service: Not on file    Active member of club or organization: Not on file    Attends meetings of clubs or organizations: Not on file    Relationship status: Not on file  . Intimate partner violence    Fear of current or ex partner: Not on file    Emotionally abused: Not on file    Physically abused: Not on file    Forced sexual activity: Not on file  Other Topics Concern  . Not on file  Social History Narrative   Has a living will     BP 128/70   Pulse (!) 56   Ht 5' 5.5" (1.664 m)   Wt 112 lb 6.4 oz (51 kg)   SpO2 98%   BMI 18.42 kg/m   Physical Exam:  Well appearing NAD HEENT: Unremarkable Neck:  No JVD, no thyromegally Lymphatics:  No adenopathy Back:  No CVA tenderness Lungs:  Clear HEART:  Regular rate rhythm, no murmurs, no rubs, no clicks Abd:  soft, positive bowel sounds, no organomegally, no rebound, no guarding Ext:  2 plus pulses, no edema, no cyanosis, no clubbing Skin:  No rashes no nodules Neuro:  CN II through XII intact, motor grossly intact   Assess/Plan: 1. Orthostasis -  I have recommended she take midorine as needed and stop her cardizem. 2. PAF - if her symptoms of palpitations worsen and HR increases, we will try digoxin. 3. Coags - she has not had any bleeding. We will follow. 4. HTN - I would like her bp to run in the 150 range. I would not be too worried until the bp goes over 175.   Mikle Bosworth.D.

## 2019-06-19 NOTE — Patient Instructions (Addendum)
Medication Instructions:  Your physician has recommended you make the following change in your medication:   1.  Stop taking DILTIAZEM  2.  You will take midodrine 5 mg one tablet by mouth AS NEEDED for dizziness or lightheadedness.  Labwork: You will get lab work today:  CBC  Testing/Procedures: None ordered.  Follow-Up: Your physician wants you to follow-up in: 4 months with Dr. Lovena Le.     At this time your appointment is in office.  It may be changed to virtual at any time based on the coronavirus.    Any Other Special Instructions Will Be Listed Below (If Applicable).  If you need a refill on your cardiac medications before your next appointment, please call your pharmacy.    Midodrine tablets What is this medicine? MIDODRINE (MI doe dreen) is used to treat low blood pressure in patients who have symptoms like dizziness when going from a sitting to a standing position. This medicine may be used for other purposes; ask your health care provider or pharmacist if you have questions. COMMON BRAND NAME(S): Orvaten, ProAmatine What should I tell my health care provider before I take this medicine? They need to know if you have any of the following conditions:  difficulty passing urine  heart disease  high blood pressure  kidney disease  over active thyroid  pheochromocytoma  an unusual or allergic reaction to midodrine, other medicines, foods, dyes, or preservatives  pregnant or trying to get pregnant  breast-feeding How should I use this medicine? Take this medicine by mouth with a glass of water. Follow the directions on the prescription label. The last dose of this medicine should not be taken after the evening meal or less than 4 hours before bedtime. When you lie down for any length of time after taking this medicine, high blood pressure can occur. Do not take this medicine if you will be lying down for any length of time. Do not take your medicine more often  than directed. Do not stop taking except on your doctor's advice. Talk to your pediatrician regarding the use of this medicine in children. Special care may be needed. Overdosage: If you think you have taken too much of this medicine contact a poison control center or emergency room at once. NOTE: This medicine is only for you. Do not share this medicine with others. What if I miss a dose? If you miss a dose, take it as soon as you can. If it is almost time for your next dose, take only that dose. Do not take double or extra doses. What may interact with this medicine? Do not take this medicine with any of the following medications:  MAOIs like Carbex, Eldepryl, Marplan, Nardil, and Parnate  medicines called ergot alkaloids  medicines for colds and breathing difficulties or weight loss  procarbazine This medicine may also interact with the following medications:  cimetidine  digoxin  flecainide  fludrocortisone  metformin  procainamide  quinidine  ranitidine  triamterene  medicines called alpha-blockers like doxazosin, prazosin, and terazosin This list may not describe all possible interactions. Give your health care provider a list of all the medicines, herbs, non-prescription drugs, or dietary supplements you use. Also tell them if you smoke, drink alcohol, or use illegal drugs. Some items may interact with your medicine. What should I watch for while using this medicine? Visit your doctor or health care professional for regular checks on your progress. You may get drowsy or dizzy. Do not drive, use machinery,  or do anything that needs mental alertness until you know how this medicine affects you. Do not stand or sit up quickly, especially if you are an older patient. This reduces the risk of dizzy or fainting spells. Your mouth may get dry. Chewing sugarless gum or sucking hard candy, and drinking plenty of water may help. Contact your doctor if the problem does not go  away or is severe. Do not treat yourself for coughs, colds, or pain while you are taking this medicine without asking your doctor or health care professional for advice. Some ingredients may increase your blood pressure. What side effects may I notice from receiving this medicine? Side effects that you should report to your doctor or health care professional as soon as possible:  awareness of heart beating  blurred vision  headache  irregular heartbeat, palpitations, or chest pain  pounding in the ears  skin rash, hives Side effects that usually do not require medical attention (report to your doctor or health care professional if they continue or are bothersome):  change in heart rate  chills  goose bumps  increased need to urinate  itching  stomach pain  tingling in the skin or scalp This list may not describe all possible side effects. Call your doctor for medical advice about side effects. You may report side effects to FDA at 1-800-FDA-1088. Where should I keep my medicine? Keep out of the reach of children. Store at room temperature between 15 and 30 degrees C (59 and 86 degrees F). Throw away any unused medicine after the expiration date. NOTE: This sheet is a summary. It may not cover all possible information. If you have questions about this medicine, talk to your doctor, pharmacist, or health care provider.  2020 Elsevier/Gold Standard (2008-01-23 13:51:24)

## 2019-06-20 LAB — CBC WITH DIFFERENTIAL/PLATELET
Basophils Absolute: 0.1 10*3/uL (ref 0.0–0.2)
Basos: 1 %
EOS (ABSOLUTE): 0.2 10*3/uL (ref 0.0–0.4)
Eos: 2 %
Hematocrit: 36.6 % (ref 34.0–46.6)
Hemoglobin: 12.2 g/dL (ref 11.1–15.9)
Immature Grans (Abs): 0 10*3/uL (ref 0.0–0.1)
Immature Granulocytes: 0 %
Lymphocytes Absolute: 2.5 10*3/uL (ref 0.7–3.1)
Lymphs: 30 %
MCH: 28.1 pg (ref 26.6–33.0)
MCHC: 33.3 g/dL (ref 31.5–35.7)
MCV: 84 fL (ref 79–97)
Monocytes Absolute: 0.7 10*3/uL (ref 0.1–0.9)
Monocytes: 8 %
Neutrophils Absolute: 4.9 10*3/uL (ref 1.4–7.0)
Neutrophils: 59 %
Platelets: 366 10*3/uL (ref 150–450)
RBC: 4.34 x10E6/uL (ref 3.77–5.28)
RDW: 14.3 % (ref 11.7–15.4)
WBC: 8.4 10*3/uL (ref 3.4–10.8)

## 2019-06-26 MED ORDER — LOSARTAN POTASSIUM 25 MG PO TABS: ORAL_TABLET | ORAL | 3 refills | Status: DC

## 2019-07-05 NOTE — Telephone Encounter (Deleted)
LMTCB re: recent My Chart messages to clarify if pt is having problems with her meds.

## 2019-07-05 NOTE — Telephone Encounter (Signed)
Opened chart note in error no call made to the pt.. pt previous My Chart question has been answered by Dr. Tanna Furry nurse.

## 2019-07-17 ENCOUNTER — Other Ambulatory Visit: Payer: Self-pay | Admitting: Family Medicine

## 2019-07-17 DIAGNOSIS — J40 Bronchitis, not specified as acute or chronic: Secondary | ICD-10-CM

## 2019-07-19 ENCOUNTER — Other Ambulatory Visit: Payer: Self-pay

## 2019-07-19 DIAGNOSIS — I10 Essential (primary) hypertension: Secondary | ICD-10-CM

## 2019-07-19 NOTE — Progress Notes (Signed)
end 

## 2019-07-28 ENCOUNTER — Telehealth: Payer: Self-pay

## 2019-07-28 NOTE — Telephone Encounter (Signed)
Copied from [place]. Topic: Referral - Request for Referral >> Jul 19, 2019 10:52 AM Richardo Priest, NT wrote: Has patient seen PCP for this complaint? yes *If NO, is insurance requiring patient see PCP for this issue before PCP can refer them? Referral for which specialty: Endocrinology  Preferred provider/office: Dr.Kerr  Reason for referral: 2nd opinion due to not improving with cardiologist. Wants to regulate BP and talk about possible kidney issue. Fax:[phone]

## 2019-07-28 NOTE — Telephone Encounter (Signed)
Yes okay for referral.  Thank you.

## 2019-07-31 ENCOUNTER — Other Ambulatory Visit: Payer: Self-pay

## 2019-07-31 DIAGNOSIS — N183 Chronic kidney disease, stage 3 unspecified: Secondary | ICD-10-CM

## 2019-07-31 DIAGNOSIS — I10 Essential (primary) hypertension: Secondary | ICD-10-CM

## 2019-07-31 NOTE — Telephone Encounter (Signed)
Chronic kidney disease and hypertension.

## 2019-07-31 NOTE — Telephone Encounter (Signed)
What's the dx?

## 2019-07-31 NOTE — Progress Notes (Signed)
de

## 2019-08-03 ENCOUNTER — Telehealth: Payer: Self-pay

## 2019-08-03 ENCOUNTER — Telehealth: Payer: Self-pay | Admitting: Internal Medicine

## 2019-08-03 NOTE — Telephone Encounter (Signed)
Patient scheduled for new patient appt.

## 2019-08-03 NOTE — Telephone Encounter (Signed)
Copied from [place] [id number]. Topic: General - Other >> Aug 03, 2019  1:55 PM [name] C wrote: Reason for CRM: pt's daughter Jerrel Ivory is a current pt of Margretta Sidle, she would like to know if she would take pt on as np?   Please advised.

## 2019-08-03 NOTE — Telephone Encounter (Signed)
We are recommending the COVID-19 vaccine to all of our patients. Cardiac medications (including blood thinners) should not deter anyone from being vaccinated and there is no need to hold any of those medications prior to vaccine administration.     Currently, there is a hotline to call (active 07/28/19) to schedule vaccination appointments as no walk-ins will be accepted.   Number: [phone]    If you have further questions or concerns about the vaccine process, please visit [email or web address] or contact your primary care physician.  Patient verbalized understanding.

## 2019-08-03 NOTE — Telephone Encounter (Signed)
Yes that would be OK.

## 2019-08-07 ENCOUNTER — Other Ambulatory Visit: Payer: Self-pay | Admitting: Family Medicine

## 2019-08-18 ENCOUNTER — Ambulatory Visit: Payer: PPO

## 2019-08-25 ENCOUNTER — Ambulatory Visit: Payer: PPO

## 2019-09-04 ENCOUNTER — Ambulatory Visit: Payer: PPO

## 2019-10-17 ENCOUNTER — Telehealth: Payer: Self-pay | Admitting: Internal Medicine

## 2019-10-17 NOTE — Telephone Encounter (Signed)
New Message:      Pt have an appt with Dr Lovena Le tomorrow(10-18-19). She says she need her daughter to come in with her, she can not hear well.

## 2019-10-17 NOTE — Telephone Encounter (Signed)
Attempted to contact pt to let her know that a note has been placed in the chart that it is ok for her daughter to accompany.  Phone rang several times with no answer and no VM.

## 2019-10-18 ENCOUNTER — Other Ambulatory Visit: Payer: Self-pay

## 2019-10-18 ENCOUNTER — Encounter: Payer: Self-pay | Admitting: Internal Medicine

## 2019-10-18 ENCOUNTER — Ambulatory Visit: Payer: PPO | Admitting: Internal Medicine

## 2019-10-18 VITALS — BP 160/82 | HR 100 | Ht 65.5 in | Wt 111.0 lb

## 2019-10-18 DIAGNOSIS — I951 Orthostatic hypotension: Secondary | ICD-10-CM

## 2019-10-18 DIAGNOSIS — I10 Essential (primary) hypertension: Secondary | ICD-10-CM

## 2019-10-18 DIAGNOSIS — I480 Paroxysmal atrial fibrillation: Secondary | ICD-10-CM | POA: Diagnosis not present

## 2019-10-18 NOTE — Patient Instructions (Signed)

## 2019-10-18 NOTE — Progress Notes (Signed)
HPI Bianca [name] returns today for followup. She is a pleasant 79 yo woman with a h/o PAF, HTN, and orthostasis. In the interim, she has done well. She has not had syncope. She has been taking her midodrine. She denies palpitations. She has been taking flecainide. No bleeding. No Known Allergies   Current Outpatient Medications  Medication Sig Dispense Refill  . ADVAIR DISKUS 100-50 MCG/DOSE AEPB INHALE 1 PUFF TWICE A DAY AS DIRECTED **RINSE MOUTH AFTER USE** 60 each 5  . apixaban (ELIQUIS) 2.5 MG TABS tablet Take 2.5 mg by mouth 2 (two) times daily.    Marland Kitchen apixaban (ELIQUIS) 2.5 MG TABS tablet Take 1 tablet (2.5 mg total) by mouth 2 (two) times daily. 60 tablet 10  . busPIRone (BUSPAR) 15 MG tablet TAKE ONE TABLET TWICE DAILY 30 tablet 3  . Calcium-Magnesium (CAL/MAG PO) Take 1 tablet by mouth 3 (three) times a week.    . Cholecalciferol (VITAMIN D3) 50 MCG (2000 UT) TABS Take 2,000 Units by mouth 3 (three) times a week.     . fexofenadine (ALLEGRA) 180 MG tablet Take 180 mg by mouth daily.     Marland Kitchen lactose free nutrition (BOOST) LIQD Take 237 mLs by mouth 2 (two) times daily between meals.    Marland Kitchen losartan (COZAAR) 25 MG tablet Take 1/2 tablet as needed for systolic blood pressure over 180. 30 tablet 3  . midodrine (PROAMATINE) 5 MG tablet Take one tablet by mouth as needed for dizziness/lightheadedness.  May take up to 4 tablets in a 24 hour period. 90 tablet 3  . mirtazapine (REMERON) 15 MG tablet Take 1 tablet (15 mg total) by mouth at bedtime. 90 tablet 3  . Misc Natural Products (TART CHERRY ADVANCED) CAPS Take 1-2 capsules by mouth 3 (three) times a week.    . Multiple Vitamins-Minerals (CENTRUM SILVER 50+WOMEN) TABS Take 1 tablet by mouth 3 (three) times a week.    . Omega-3 Fatty Acids (OMEGA 3 PO) Take 1 capsule by mouth daily.     Marland Kitchen PROAIR HFA 108 (90 Base) MCG/ACT inhaler INHALE 1-2 PUFFS INTO THE LUNGS EVERY 4 HOURS AS NEEDED 8.5 g 3  . UNABLE TO FIND Rx: L8000- Post Surgical Bras  (Quantity: 6)\ Dx: 174.9; Left mastectomy 1 each 0  . vitamin C (ASCORBIC ACID) 500 MG tablet Take 500-1,000 mg by mouth 3 (three) times a week.     No current facility-administered medications for this visit.     Past Medical History:  Diagnosis Date  . Anxiety   . Arthritis   . Asthma   . COPD (chronic obstructive pulmonary disease) ([place])   . HOH (hard of hearing)   . Hypertension   . Wears dentures    top and partial bottom  . Wears glasses     ROS:   All systems reviewed and negative except as noted in the HPI.   Past Surgical History:  Procedure Laterality Date  . APPENDECTOMY    . BREAST LUMPECTOMY WITH NEEDLE LOCALIZATION Right 04/21/2013   Procedure: BREAST LUMPECTOMY WITH NEEDLE LOCALIZATION;  Surgeon: Harl Bowie, MD;  Location: [place];  Service: General;  Laterality: Right;  . BREAST SURGERY  1985   cyst rt br  . MASTECTOMY W/ SENTINEL NODE BIOPSY Left 04/21/2013   Procedure: MASTECTOMY WITH SENTINEL LYMPH NODE BIOPSY;  Surgeon: Harl Bowie, MD;  Location: [place];  Service: General;  Laterality: Left;  . OTHER SURGICAL HISTORY  cysts removed from foot  . OTHER SURGICAL HISTORY     cysts removed from breast  . TUBAL LIGATION       No family history on file.   Social History   Socioeconomic History  . Marital status: Widowed    Spouse name: Not on file  . Number of children: Not on file  . Years of education: Not on file  . Highest education level: Not on file  Occupational History  . Occupation: retired    Fish farm manager: RETIRED  Tobacco Use  . Smoking status: Current Some Day Smoker    Packs/day: 0.50    Types: Cigarettes  . Smokeless tobacco: Never Used  Substance and Sexual Activity  . Alcohol use: No  . Drug use: No  . Sexual activity: Not on file    Comment: 1-2 daily  Other Topics Concern  . Not on file  Social History Narrative   Has a living will   Social Determinants of Health    Financial Resource Strain:   . Difficulty of Paying Living Expenses:   Food Insecurity:   . Worried About Charity fundraiser in the Last Year:   . Arboriculturist in the Last Year:   Transportation Needs:   . Film/video editor (Medical):   Marland Kitchen Lack of Transportation (Non-Medical):   Physical Activity:   . Days of Exercise per Week:   . Minutes of Exercise per Session:   Stress:   . Feeling of Stress :   Social Connections:   . Frequency of Communication with Friends and Family:   . Frequency of Social Gatherings with Friends and Family:   . Attends Religious Services:   . Active Member of Clubs or Organizations:   . Attends Archivist Meetings:   Marland Kitchen Marital Status:   Intimate Partner Violence:   . Fear of Current or Ex-Partner:   . Emotionally Abused:   Marland Kitchen Physically Abused:   . Sexually Abused:      BP (!) 160/82   Pulse 100   Ht 5' 5.5" (1.664 m)   Wt 111 lb (50.3 kg)   SpO2 97%   BMI 18.19 kg/m   Physical Exam:  Well appearing 79 yo woman, NAD HEENT: Unremarkable Neck:  6 cm JVD, no thyromegally Lymphatics:  No adenopathy Back:  No CVA tenderness Lungs:  Clear with no wheezes HEART:  Regular rate rhythm, no murmurs, no rubs, no clicks Abd:  soft, positive bowel sounds, no organomegally, no rebound, no guarding Ext:  2 plus pulses, no edema, no cyanosis, no clubbing Skin:  No rashes no nodules Neuro:  CN II through XII intact, motor grossly intact  EKG - nsr  Assess/Plan: 1. PAf - she is maintaining NSR. She will continue her flecainide. 2. Autonomic dysfunction - her bp is well controlled. We discussed the frequency of midodrine and I have asked her to her bp before she takes her medication.  3. Coags - No bleeding on Eliquis 4. HTN - her bp is elevated today. I encouraged her to keep it elevated.   Mikle Bosworth.D.

## 2019-10-20 ENCOUNTER — Encounter: Payer: Self-pay | Admitting: Physician Assistant

## 2019-10-20 ENCOUNTER — Ambulatory Visit (INDEPENDENT_AMBULATORY_CARE_PROVIDER_SITE_OTHER): Payer: PPO | Admitting: Physician Assistant

## 2019-10-20 ENCOUNTER — Other Ambulatory Visit: Payer: Self-pay

## 2019-10-20 VITALS — BP 130/80 | HR 79 | Temp 96.9°F | Wt 110.6 lb

## 2019-10-20 DIAGNOSIS — R531 Weakness: Secondary | ICD-10-CM | POA: Diagnosis not present

## 2019-10-20 DIAGNOSIS — H9193 Unspecified hearing loss, bilateral: Secondary | ICD-10-CM

## 2019-10-20 DIAGNOSIS — N1831 Chronic kidney disease, stage 3a: Secondary | ICD-10-CM | POA: Diagnosis not present

## 2019-10-20 DIAGNOSIS — C50412 Malignant neoplasm of upper-outer quadrant of left female breast: Secondary | ICD-10-CM

## 2019-10-20 DIAGNOSIS — J40 Bronchitis, not specified as acute or chronic: Secondary | ICD-10-CM

## 2019-10-20 DIAGNOSIS — J438 Other emphysema: Secondary | ICD-10-CM

## 2019-10-20 DIAGNOSIS — I4891 Unspecified atrial fibrillation: Secondary | ICD-10-CM

## 2019-10-20 DIAGNOSIS — M810 Age-related osteoporosis without current pathological fracture: Secondary | ICD-10-CM

## 2019-10-20 DIAGNOSIS — F4320 Adjustment disorder, unspecified: Secondary | ICD-10-CM

## 2019-10-20 MED ORDER — BUSPIRONE HCL 15 MG PO TABS: 15.0000 mg | ORAL_TABLET | Freq: Two times a day (BID) | ORAL | 1 refills | Status: DC

## 2019-10-20 MED ORDER — MIRTAZAPINE 15 MG PO TABS: 15.0000 mg | ORAL_TABLET | Freq: Every day | ORAL | 1 refills | Status: DC

## 2019-10-20 MED ORDER — ADVAIR DISKUS 100-50 MCG/DOSE IN AEPB: INHALATION_SPRAY | RESPIRATORY_TRACT | 5 refills | Status: DC

## 2019-10-20 NOTE — Progress Notes (Signed)
Patient: Bianca [name], Female    DOB: October 06, 1940, 79 y.o.   MRN: [id number] Visit Date: 10/20/2019  Today's Provider: Trinna Post, PA-C   Chief Complaint  Patient presents with  . New Patient (Initial Visit)   Subjective:    New Patient Appointment MELIZA [name] is a 79 y.o. female who presents today for new patient appointment.  Followed by cardiology for HTN and atrial fibrillation. She is on midodrine for orthostatic hypotension and she has a 12.5 mg tablet of losartan to be taken for systolic over 628. She is also on eliquis for chronic anticoagulation for atrial fibrillation.   COPD: Currently taking adviar and feels her symptoms are well controlled. Currently smoking 2-3 cigarettes per day. Previously smoked 1/2 pack per day for 60 years.   Depression and low appetite: Taking remeron 15 mg QHS.  Wt Readings from Last 3 Encounters:  10/20/19 110 lb 9.6 oz (50.2 kg)  10/18/19 111 lb (50.3 kg)  06/19/19 112 lb 6.4 oz (51 kg)   CKD: Stage III. She has been seen by [facility] previously for this which was previously being monitored. She hasn't been there in over one year due to [place].   Osteoporosis: Last DEXA 2019 with T score of -4. She was previously on fosamax but her T score worsened while on fosamax. She was started on prolia with her previous primary care provider.   Last mammogram:09/06/2018 She has a history of breast cancer.   Needs physical therapy ordered to her house due to balance issues. She had major hospitalization last year. Not able to easily leave the house.   She has hearing loss. She reports she has seen a [facility] previously that evaluated her and told her a hearing aid would likely not help her.  -----------------------------------------------------------------     Review of Systems  Constitutional: Negative.   HENT: Negative.   Eyes: Negative.   Respiratory: Negative.   Cardiovascular: Negative.     Gastrointestinal: Negative.   Endocrine: Negative.   Genitourinary: Negative.   Musculoskeletal: Negative.   Skin: Negative.   Allergic/Immunologic: Negative.   Neurological: Negative.   Hematological: Negative.   Psychiatric/Behavioral: Negative.     Social History She  reports that she has been smoking cigarettes. She has been smoking about 0.50 packs per day. She has never used smokeless tobacco. She reports that she does not drink alcohol or use drugs. Social History   Socioeconomic History  . Marital status: Widowed    Spouse name: Not on file  . Number of children: Not on file  . Years of education: Not on file  . Highest education level: Not on file  Occupational History  . Occupation: retired    Fish farm manager: RETIRED  Tobacco Use  . Smoking status: Current Some Day Smoker    Packs/day: 0.50    Types: Cigarettes  . Smokeless tobacco: Never Used  Substance and Sexual Activity  . Alcohol use: No  . Drug use: No  . Sexual activity: Not on file    Comment: 1-2 daily  Other Topics Concern  . Not on file  Social History Narrative   Has a living will   Social Determinants of Health   Financial Resource Strain:   . Difficulty of Paying Living Expenses:   Food Insecurity:   . Worried About Charity fundraiser in the Last Year:   . Arboriculturist in the Last Year:   Transportation Needs:   .  Lack of Transportation (Medical):   Marland Kitchen Lack of Transportation (Non-Medical):   Physical Activity:   . Days of Exercise per Week:   . Minutes of Exercise per Session:   Stress:   . Feeling of Stress :   Social Connections:   . Frequency of Communication with Friends and Family:   . Frequency of Social Gatherings with Friends and Family:   . Attends Religious Services:   . Active Member of Clubs or Organizations:   . Attends Archivist Meetings:   Marland Kitchen Marital Status:     Patient Active Problem List   Diagnosis Date Noted  . Orthostasis 06/19/2019  . Paroxysmal  atrial fibrillation ([place]) 03/16/2019  . Mitral regurgitation 03/16/2019  . Elevated troponin   . Delirium   . Atrial fibrillation with RVR ([place]) 01/21/2019  . Underweight due to inadequate caloric intake 12/06/2017  . Acute bronchitis with COPD ([place]) 03/29/2017  . Osteoporosis 07/02/2016  . Tobacco abuse 10/26/2013  . Breast cancer of upper-outer quadrant of left female breast ([place]) 03/06/2013  . COPD (chronic obstructive pulmonary disease) ([place]) 06/09/2012  . Adjustment disorder 11/25/2010  . CHRONIC KIDNEY DISEASE STAGE III ([place]) 11/13/2009  . Depression 05/04/2007  . Asthma, chronic, unspecified asthma severity, with acute exacerbation 05/03/2007  . HYPERTENSION, BENIGN ESSENTIAL 12/06/2006  . ALLERGIC  RHINITIS 12/06/2006    Past Surgical History:  Procedure Laterality Date  . APPENDECTOMY    . BREAST LUMPECTOMY WITH NEEDLE LOCALIZATION Right 04/21/2013   Procedure: BREAST LUMPECTOMY WITH NEEDLE LOCALIZATION;  Surgeon: Harl Bowie, MD;  Location: [place];  Service: General;  Laterality: Right;  . BREAST SURGERY  1985   cyst rt br  . MASTECTOMY W/ SENTINEL NODE BIOPSY Left 04/21/2013   Procedure: MASTECTOMY WITH SENTINEL LYMPH NODE BIOPSY;  Surgeon: Harl Bowie, MD;  Location: [place];  Service: General;  Laterality: Left;  . OTHER SURGICAL HISTORY     cysts removed from foot  . OTHER SURGICAL HISTORY     cysts removed from breast  . TUBAL LIGATION      Family History  Family Status  Relation Name Status  . Mother  Deceased  . Father  Deceased   Her family history is not on file.     No Known Allergies  Previous Medications   ADVAIR DISKUS 100-50 MCG/DOSE AEPB    INHALE 1 PUFF TWICE A DAY AS DIRECTED **RINSE MOUTH AFTER USE**   APIXABAN (ELIQUIS) 2.5 MG TABS TABLET    Take 1 tablet (2.5 mg total) by mouth 2 (two) times daily.   BUSPIRONE (BUSPAR) 15 MG TABLET    TAKE ONE TABLET TWICE DAILY   CALCIUM-MAGNESIUM  (CAL/MAG PO)    Take 1 tablet by mouth 3 (three) times a week.   CHOLECALCIFEROL (VITAMIN D3) 50 MCG (2000 UT) TABS    Take 2,000 Units by mouth 3 (three) times a week.    FEXOFENADINE (ALLEGRA) 180 MG TABLET    Take 180 mg by mouth daily.    LACTOSE FREE NUTRITION (BOOST) LIQD    Take 237 mLs by mouth 2 (two) times daily between meals.   LOSARTAN (COZAAR) 25 MG TABLET    Take 1/2 tablet as needed for systolic blood pressure over 180.   MIDODRINE (PROAMATINE) 5 MG TABLET    Take one tablet by mouth as needed for dizziness/lightheadedness.  May take up to 4 tablets in a 24 hour period.   MIRTAZAPINE (REMERON) 15 MG TABLET  Take 1 tablet (15 mg total) by mouth at bedtime.   MISC NATURAL PRODUCTS (TART CHERRY ADVANCED) CAPS    Take 1-2 capsules by mouth 3 (three) times a week.   MULTIPLE VITAMINS-MINERALS (CENTRUM SILVER 50+WOMEN) TABS    Take 1 tablet by mouth 3 (three) times a week.   OMEGA-3 FATTY ACIDS (OMEGA 3 PO)    Take 1 capsule by mouth daily.    PROAIR HFA 108 (90 BASE) MCG/ACT INHALER    INHALE 1-2 PUFFS INTO THE LUNGS EVERY 4 HOURS AS NEEDED   UNABLE TO FIND    Rx: L8000- Post Surgical Bras (Quantity: 6)\ Dx: 174.9; Left mastectomy   VITAMIN C (ASCORBIC ACID) 500 MG TABLET    Take 500-1,000 mg by mouth 3 (three) times a week.    Patient Care Team: Paulene Floor as PCP - General (Physician Assistant) Evans Lance, MD as PCP - Cardiology (Cardiology) Magrinat, Virgie Dad, MD as Consulting Physician (Oncology) Coralie Keens, MD as Consulting Physician (General Surgery)      Objective:   Vitals: BP 130/80 (BP Location: Right Arm, Patient Position: Sitting, Cuff Size: Normal)   Pulse 79   Temp (!) 96.9 F (36.1 C) (Temporal)   Wt 110 lb 9.6 oz (50.2 kg)   SpO2 96%   BMI 18.12 kg/m    Physical Exam Constitutional:      Appearance: Normal appearance.     Comments: Patient is very hard of hearing and requires loud volume of speaking. Frail appearing.     Cardiovascular:     Rate and Rhythm: Normal rate and regular rhythm.     Heart sounds: Normal heart sounds.  Pulmonary:     Effort: Pulmonary effort is normal.     Breath sounds: Normal breath sounds.  Skin:    General: Skin is warm and dry.  Neurological:     Mental Status: She is alert and oriented to person, place, and time. Mental status is at baseline.  Psychiatric:        Mood and Affect: Mood normal.        Behavior: Behavior normal.      Depression Screen PHQ 2/9 Scores 10/20/2019 12/26/2018 12/15/2018 07/27/2018  PHQ - 2 Score 0 0 6 0  PHQ- 9 Score 1 0 12 -      Assessment & Plan:     Routine Health Maintenance and Physical Exam  Exercise Activities and Dietary recommendations Goals    . Gain weight     Starting 04/19/17, I will continue to drink 2 cans of Boost in an effort to gain weight.        Immunization History  Administered Date(s) Administered  . Influenza Split 05/12/2011  . Influenza, High Dose Seasonal PF 05/17/2015  . Influenza,inj,Quad PF,6+ Mos 04/24/2016, 04/19/2017  . Influenza-Unspecified 05/20/2013, 05/03/2014, 04/29/2018  . Pneumococcal Conjugate-13 10/26/2013  . Pneumococcal Polysaccharide-23 12/03/2014  . Td 09/18/1994, 12/25/2008  . Zoster 10/06/2008    Health Maintenance  Topic Date Due  . TETANUS/TDAP  12/26/2018  . INFLUENZA VACCINE  02/18/2020  . DEXA SCAN  06/20/2020  . PNA vac Low Risk Adult  Completed     Discussed health benefits of physical activity, and encouraged her to engage in regular exercise appropriate for her age and condition.    1. Atrial fibrillation with RVR ([place])  Followed by cardiology on eliquis for chronic anticoagulation.   2. Bilateral hearing loss, unspecified hearing loss type  Unsure what type of hearing loss  she has and what the assessment was from previous provider. Will refer for second opinion at [facility].   - Ambulatory referral to ENT  3. Stage 3a chronic kidney disease  We will  monitor, follow up with nephrology PRN.  - Comprehensive Metabolic Panel (CMET) - Lipid Profile - CBC with Differential  4. Other emphysema ([place])  Continues to smoke 3 cigarettes per day. 30 pack year smoking history.   5. Bronchitis  - ADVAIR DISKUS 100-50 MCG/DOSE AEPB; INHALE 1 PUFF TWICE A DAY AS DIRECTED **RINSE MOUTH AFTER USE**  Dispense: 60 each; Refill: 5  6. Adjustment disorder, unspecified type  - busPIRone (BUSPAR) 15 MG tablet; Take 1 tablet (15 mg total) by mouth 2 (two) times daily.  Dispense: 90 tablet; Refill: 1 - mirtazapine (REMERON) 15 MG tablet; Take 1 tablet (15 mg total) by mouth at bedtime.  Dispense: 90 tablet; Refill: 1  7. Osteoporosis, unspecified osteoporosis type, unspecified pathological fracture presence  - Ambulatory referral to Endocrinology  8. Weakness  Order placed for home physical therapy.   9. Malignant neoplasm of upper-outer quadrant of left female breast, unspecified estrogen receptor status ([place])  - MM Digital Screening; Future  --------------------------------------------------------------------

## 2019-10-30 DIAGNOSIS — N1831 Chronic kidney disease, stage 3a: Secondary | ICD-10-CM | POA: Diagnosis not present

## 2019-10-31 DIAGNOSIS — H9193 Unspecified hearing loss, bilateral: Secondary | ICD-10-CM | POA: Diagnosis not present

## 2019-10-31 DIAGNOSIS — Z7951 Long term (current) use of inhaled steroids: Secondary | ICD-10-CM | POA: Diagnosis not present

## 2019-10-31 DIAGNOSIS — I129 Hypertensive chronic kidney disease with stage 1 through stage 4 chronic kidney disease, or unspecified chronic kidney disease: Secondary | ICD-10-CM | POA: Diagnosis not present

## 2019-10-31 DIAGNOSIS — I951 Orthostatic hypotension: Secondary | ICD-10-CM | POA: Diagnosis not present

## 2019-10-31 DIAGNOSIS — N1831 Chronic kidney disease, stage 3a: Secondary | ICD-10-CM | POA: Diagnosis not present

## 2019-10-31 DIAGNOSIS — F4320 Adjustment disorder, unspecified: Secondary | ICD-10-CM | POA: Diagnosis not present

## 2019-10-31 DIAGNOSIS — F05 Delirium due to known physiological condition: Secondary | ICD-10-CM | POA: Diagnosis not present

## 2019-10-31 DIAGNOSIS — Z7901 Long term (current) use of anticoagulants: Secondary | ICD-10-CM | POA: Diagnosis not present

## 2019-10-31 DIAGNOSIS — I340 Nonrheumatic mitral (valve) insufficiency: Secondary | ICD-10-CM | POA: Diagnosis not present

## 2019-10-31 DIAGNOSIS — C50412 Malignant neoplasm of upper-outer quadrant of left female breast: Secondary | ICD-10-CM | POA: Diagnosis not present

## 2019-10-31 DIAGNOSIS — J438 Other emphysema: Secondary | ICD-10-CM | POA: Diagnosis not present

## 2019-10-31 DIAGNOSIS — M818 Other osteoporosis without current pathological fracture: Secondary | ICD-10-CM | POA: Diagnosis not present

## 2019-10-31 DIAGNOSIS — J309 Allergic rhinitis, unspecified: Secondary | ICD-10-CM | POA: Diagnosis not present

## 2019-10-31 DIAGNOSIS — I480 Paroxysmal atrial fibrillation: Secondary | ICD-10-CM | POA: Diagnosis not present

## 2019-10-31 DIAGNOSIS — F17210 Nicotine dependence, cigarettes, uncomplicated: Secondary | ICD-10-CM | POA: Diagnosis not present

## 2019-10-31 LAB — LIPID PANEL
Chol/HDL Ratio: 3.1 ratio (ref 0.0–4.4)
Cholesterol, Total: 150 mg/dL (ref 100–199)
HDL: 48 mg/dL (ref >39)
LDL Chol Calc (NIH): 86 mg/dL (ref 0–99)
Triglycerides: 85 mg/dL (ref 0–149)
VLDL Cholesterol Cal: 16 mg/dL (ref 5–40)

## 2019-10-31 LAB — CBC WITH DIFFERENTIAL/PLATELET
Basophils Absolute: 0.1 10*3/uL (ref 0.0–0.2)
Basos: 1 %
EOS (ABSOLUTE): 0.1 10*3/uL (ref 0.0–0.4)
Eos: 2 %
Hematocrit: 33.7 % — ABNORMAL LOW (ref 34.0–46.6)
Hemoglobin: 11.3 g/dL (ref 11.1–15.9)
Immature Grans (Abs): 0 10*3/uL (ref 0.0–0.1)
Immature Granulocytes: 0 %
Lymphocytes Absolute: 2.2 10*3/uL (ref 0.7–3.1)
Lymphs: 26 %
MCH: 28.3 pg (ref 26.6–33.0)
MCHC: 33.5 g/dL (ref 31.5–35.7)
MCV: 85 fL (ref 79–97)
Monocytes Absolute: 0.8 10*3/uL (ref 0.1–0.9)
Monocytes: 10 %
Neutrophils Absolute: 5.2 10*3/uL (ref 1.4–7.0)
Neutrophils: 61 %
Platelets: 361 10*3/uL (ref 150–450)
RBC: 3.99 x10E6/uL (ref 3.77–5.28)
RDW: 13.2 % (ref 11.7–15.4)
WBC: 8.4 10*3/uL (ref 3.4–10.8)

## 2019-10-31 LAB — COMPREHENSIVE METABOLIC PANEL
ALT: 10 IU/L (ref 0–32)
AST: 17 IU/L (ref 0–40)
Albumin/Globulin Ratio: 1.6 (ref 1.2–2.2)
Albumin: 3.9 g/dL (ref 3.7–4.7)
Alkaline Phosphatase: 101 IU/L (ref 39–117)
BUN/Creatinine Ratio: 11 — ABNORMAL LOW (ref 12–28)
BUN: 14 mg/dL (ref 8–27)
Bilirubin Total: 0.2 mg/dL (ref 0.0–1.2)
CO2: 22 mmol/L (ref 20–29)
Calcium: 9.2 mg/dL (ref 8.7–10.3)
Chloride: 103 mmol/L (ref 96–106)
Creatinine, Ser: 1.32 mg/dL — ABNORMAL HIGH (ref 0.57–1.00)
GFR calc Af Amer: 44 mL/min/{1.73_m2} — ABNORMAL LOW (ref >59)
GFR calc non Af Amer: 38 mL/min/{1.73_m2} — ABNORMAL LOW (ref >59)
Globulin, Total: 2.4 g/dL (ref 1.5–4.5)
Glucose: 74 mg/dL (ref 65–99)
Potassium: 4.1 mmol/L (ref 3.5–5.2)
Sodium: 140 mmol/L (ref 134–144)
Total Protein: 6.3 g/dL (ref 6.0–8.5)

## 2019-11-07 ENCOUNTER — Other Ambulatory Visit: Payer: Self-pay | Admitting: Internal Medicine

## 2019-11-07 DIAGNOSIS — H9193 Unspecified hearing loss, bilateral: Secondary | ICD-10-CM | POA: Diagnosis not present

## 2019-11-07 DIAGNOSIS — Z7901 Long term (current) use of anticoagulants: Secondary | ICD-10-CM | POA: Diagnosis not present

## 2019-11-07 DIAGNOSIS — I480 Paroxysmal atrial fibrillation: Secondary | ICD-10-CM | POA: Diagnosis not present

## 2019-11-07 DIAGNOSIS — F05 Delirium due to known physiological condition: Secondary | ICD-10-CM | POA: Diagnosis not present

## 2019-11-07 DIAGNOSIS — I951 Orthostatic hypotension: Secondary | ICD-10-CM | POA: Diagnosis not present

## 2019-11-07 DIAGNOSIS — C50412 Malignant neoplasm of upper-outer quadrant of left female breast: Secondary | ICD-10-CM | POA: Diagnosis not present

## 2019-11-07 DIAGNOSIS — Z7951 Long term (current) use of inhaled steroids: Secondary | ICD-10-CM | POA: Diagnosis not present

## 2019-11-07 DIAGNOSIS — J438 Other emphysema: Secondary | ICD-10-CM | POA: Diagnosis not present

## 2019-11-07 DIAGNOSIS — F4320 Adjustment disorder, unspecified: Secondary | ICD-10-CM | POA: Diagnosis not present

## 2019-11-07 DIAGNOSIS — N1831 Chronic kidney disease, stage 3a: Secondary | ICD-10-CM | POA: Diagnosis not present

## 2019-11-07 DIAGNOSIS — M818 Other osteoporosis without current pathological fracture: Secondary | ICD-10-CM | POA: Diagnosis not present

## 2019-11-07 DIAGNOSIS — I129 Hypertensive chronic kidney disease with stage 1 through stage 4 chronic kidney disease, or unspecified chronic kidney disease: Secondary | ICD-10-CM | POA: Diagnosis not present

## 2019-11-07 DIAGNOSIS — I340 Nonrheumatic mitral (valve) insufficiency: Secondary | ICD-10-CM | POA: Diagnosis not present

## 2019-11-07 DIAGNOSIS — F17210 Nicotine dependence, cigarettes, uncomplicated: Secondary | ICD-10-CM | POA: Diagnosis not present

## 2019-11-07 DIAGNOSIS — J309 Allergic rhinitis, unspecified: Secondary | ICD-10-CM | POA: Diagnosis not present

## 2019-11-08 MED ORDER — MIDODRINE HCL 5 MG PO TABS: ORAL_TABLET | ORAL | 3 refills | Status: DC

## 2019-11-14 DIAGNOSIS — I129 Hypertensive chronic kidney disease with stage 1 through stage 4 chronic kidney disease, or unspecified chronic kidney disease: Secondary | ICD-10-CM | POA: Diagnosis not present

## 2019-11-14 DIAGNOSIS — C50412 Malignant neoplasm of upper-outer quadrant of left female breast: Secondary | ICD-10-CM | POA: Diagnosis not present

## 2019-11-14 DIAGNOSIS — J438 Other emphysema: Secondary | ICD-10-CM | POA: Diagnosis not present

## 2019-11-14 DIAGNOSIS — J309 Allergic rhinitis, unspecified: Secondary | ICD-10-CM | POA: Diagnosis not present

## 2019-11-14 DIAGNOSIS — Z7901 Long term (current) use of anticoagulants: Secondary | ICD-10-CM | POA: Diagnosis not present

## 2019-11-14 DIAGNOSIS — H9193 Unspecified hearing loss, bilateral: Secondary | ICD-10-CM | POA: Diagnosis not present

## 2019-11-14 DIAGNOSIS — F05 Delirium due to known physiological condition: Secondary | ICD-10-CM | POA: Diagnosis not present

## 2019-11-14 DIAGNOSIS — I951 Orthostatic hypotension: Secondary | ICD-10-CM | POA: Diagnosis not present

## 2019-11-14 DIAGNOSIS — F4320 Adjustment disorder, unspecified: Secondary | ICD-10-CM | POA: Diagnosis not present

## 2019-11-14 DIAGNOSIS — N1831 Chronic kidney disease, stage 3a: Secondary | ICD-10-CM | POA: Diagnosis not present

## 2019-11-14 DIAGNOSIS — M818 Other osteoporosis without current pathological fracture: Secondary | ICD-10-CM | POA: Diagnosis not present

## 2019-11-14 DIAGNOSIS — F17210 Nicotine dependence, cigarettes, uncomplicated: Secondary | ICD-10-CM | POA: Diagnosis not present

## 2019-11-14 DIAGNOSIS — I480 Paroxysmal atrial fibrillation: Secondary | ICD-10-CM | POA: Diagnosis not present

## 2019-11-14 DIAGNOSIS — Z7951 Long term (current) use of inhaled steroids: Secondary | ICD-10-CM | POA: Diagnosis not present

## 2019-11-14 DIAGNOSIS — I340 Nonrheumatic mitral (valve) insufficiency: Secondary | ICD-10-CM | POA: Diagnosis not present

## 2019-11-14 NOTE — Progress Notes (Signed)
Subjective:   Bianca [name] is a 79 y.o. female who presents for Medicare Annual (Subsequent) preventive examination.    This visit is being conducted through telemedicine due to the COVID-19 pandemic. This patient has given me verbal consent via doximity to conduct this visit, patient states they are participating from their home address. Some vital signs may be absent or patient reported.    Patient identification: identified by name, DOB, and current address  Review of Systems:  N/A  Cardiac Risk Factors include: advanced age (>98men, >63 women);hypertension     Objective:     Vitals: There were no vitals taken for this visit.  There is no height or weight on file to calculate BMI. Unable to obtain vitals due to visit being conducted via telephonically.   Advanced Directives 11/15/2019 01/21/2019 04/19/2017 04/17/2013  Does Patient Have a Medical Advance Directive? Yes No Yes Patient has advance directive, copy not in chart  Type of Advance Directive [place];Living will - [place];Living will Living will;Healthcare Power of [place] in Chart? No - copy requested - No - copy requested -  Would patient like information on creating a medical advance directive? - Yes (ED - Information included in AVS) - -    Tobacco Social History   Tobacco Use  Smoking Status Current Every Day Smoker  . Packs/day: 0.25  . Types: Cigarettes  Smokeless Tobacco Never Used     Ready to quit: No Counseling given: No   Clinical Intake:  Pre-visit preparation completed: Yes  Pain : No/denies pain Pain Score: 0-No pain     Nutritional Risks: None Diabetes: No  How often do you need to have someone help you when you read instructions, pamphlets, or other written materials from your doctor or pharmacy?: 1 - Never  Interpreter Needed?: No  Information entered by :: [facility], LPN  Past Medical History:    Diagnosis Date  . Anxiety   . Arthritis   . Asthma   . COPD (chronic obstructive pulmonary disease) ([place])   . HOH (hard of hearing)   . Hypertension   . Wears dentures    top and partial bottom  . Wears glasses    Past Surgical History:  Procedure Laterality Date  . APPENDECTOMY    . BREAST LUMPECTOMY WITH NEEDLE LOCALIZATION Right 04/21/2013   Procedure: BREAST LUMPECTOMY WITH NEEDLE LOCALIZATION;  Surgeon: Harl Bowie, MD;  Location: [facility];  Service: General;  Laterality: Right;  . BREAST SURGERY  1985   cyst rt br  . MASTECTOMY W/ SENTINEL NODE BIOPSY Left 04/21/2013   Procedure: MASTECTOMY WITH SENTINEL LYMPH NODE BIOPSY;  Surgeon: Harl Bowie, MD;  Location: [facility];  Service: General;  Laterality: Left;  . OTHER SURGICAL HISTORY     cysts removed from foot  . OTHER SURGICAL HISTORY     cysts removed from breast  . TUBAL LIGATION     History reviewed. No pertinent family history. Social History   Socioeconomic History  . Marital status: Widowed    Spouse name: Not on file  . Number of children: 2  . Years of education: Not on file  . Highest education level: Some college, no degree  Occupational History  . Occupation: retired    Fish farm manager: RETIRED  Tobacco Use  . Smoking status: Current Every Day Smoker    Packs/day: 0.25    Types: Cigarettes  .  Smokeless tobacco: Never Used  Substance and Sexual Activity  . Alcohol use: No  . Drug use: No  . Sexual activity: Not on file    Comment: 1-2 daily  Other Topics Concern  . Not on file  Social History Narrative   Has a living will   Social Determinants of Health   Financial Resource Strain: Low Risk   . Difficulty of Paying Living Expenses: Not hard at all  Food Insecurity: No Food Insecurity  . Worried About Charity fundraiser in the Last Year: Never true  . Ran Out of Food in the Last Year: Never true  Transportation Needs: No Transportation Needs  . Lack  of Transportation (Medical): No  . Lack of Transportation (Non-Medical): No  Physical Activity: Inactive  . Days of Exercise per Week: 0 days  . Minutes of Exercise per Session: 0 min  Stress: No Stress Concern Present  . Feeling of Stress : Not at all  Social Connections: Moderately Isolated  . Frequency of Communication with Friends and Family: More than three times a week  . Frequency of Social Gatherings with Friends and Family: More than three times a week  . Attends Religious Services: Never  . Active Member of Clubs or Organizations: No  . Attends Archivist Meetings: Never  . Marital Status: Widowed    Outpatient Encounter Medications as of 11/15/2019  Medication Sig  . ADVAIR DISKUS 100-50 MCG/DOSE AEPB INHALE 1 PUFF TWICE A DAY AS DIRECTED **RINSE MOUTH AFTER USE**  . apixaban (ELIQUIS) 2.5 MG TABS tablet Take 1 tablet (2.5 mg total) by mouth 2 (two) times daily.  . busPIRone (BUSPAR) 15 MG tablet Take 1 tablet (15 mg total) by mouth 2 (two) times daily.  . Calcium-Magnesium (CAL/MAG PO) Take 1 tablet by mouth 3 (three) times a week.  . Cholecalciferol (VITAMIN D3) 50 MCG (2000 UT) TABS Take 2,000 Units by mouth 3 (three) times a week.   . fexofenadine (ALLEGRA) 180 MG tablet Take 180 mg by mouth daily.   Marland Kitchen lactose free nutrition (BOOST) LIQD Take 237 mLs by mouth 2 (two) times daily between meals.  Marland Kitchen losartan (COZAAR) 25 MG tablet Take 1/2 tablet as needed for systolic blood pressure over 180.  . midodrine (PROAMATINE) 5 MG tablet Take one tablet by mouth as needed for dizziness/lightheadedness.  May take up to 4 tablets in a 24 hour period.  . mirtazapine (REMERON) 15 MG tablet Take 1 tablet (15 mg total) by mouth at bedtime.  . Misc Natural Products (TART CHERRY ADVANCED) CAPS Take 1-2 capsules by mouth 3 (three) times a week.  . Multiple Vitamins-Minerals (CENTRUM SILVER 50+WOMEN) TABS Take 1 tablet by mouth 3 (three) times a week.  . Omega-3 Fatty Acids (OMEGA 3  PO) Take 1 capsule by mouth daily.   Marland Kitchen PROAIR HFA 108 (90 Base) MCG/ACT inhaler INHALE 1-2 PUFFS INTO THE LUNGS EVERY 4 HOURS AS NEEDED  . UNABLE TO FIND Rx: [id number]- Post Surgical Bras (Quantity: 6)\ Dx: 174.9; Left mastectomy  . vitamin C (ASCORBIC ACID) 500 MG tablet Take 500-1,000 mg by mouth 3 (three) times a week.   No facility-administered encounter medications on file as of 11/15/2019.    Activities of Daily Living In your present state of health, do you have any difficulty performing the following activities: 11/15/2019 10/20/2019  Hearing? Y Y  Comment Going to see an audiologist tomorrow for hearing aids. -  Vision? N N  Difficulty concentrating or making decisions?  N N  Walking or climbing stairs? N Y  Dressing or bathing? N Y  Doing errands, shopping? N Y  Conservation officer, nature and eating ? N -  Using the Toilet? N -  In the past six months, have you accidently leaked urine? N -  Do you have problems with loss of bowel control? N -  Managing your Medications? N -  Managing your Finances? N -  Housekeeping or managing your Housekeeping? N -  Some recent data might be hidden    Patient Care Team: Paulene Floor as PCP - General (Physician Assistant) Evans Lance, MD as PCP - Cardiology (Cardiology)    Assessment:   This is a routine wellness examination for [place].  Exercise Activities and Dietary recommendations Current Exercise Habits: Home exercise routine, Type of exercise: Other - see comments;stretching(PT comes out to home), Time (Minutes): 45, Frequency (Times/Week): 2, Weekly Exercise (Minutes/Week): 90, Intensity: Mild, Exercise limited by: orthopedic condition(s)  Goals    . Gain weight     Starting 04/19/17, I will continue to drink 2 cans of Boost in an effort to gain weight.     Marland Kitchen LIFESTYLE - DECREASE FALLS RISK     Recommend to remove any items from the home that may cause slips or trips.    . Quit Smoking     Recommend to continue efforts to  reduce smoking habits until no longer smoking.       Fall Risk: Fall Risk  11/15/2019 10/20/2019 02/17/2019 07/27/2018 04/19/2017  Falls in the past year? 1 1 (No Data) 0 Yes  Comment - - Emmi Telephone Survey: data to providers prior to load - medication related falls; states BP gets too low  Number falls in past yr: 0 0 (No Data) - 2 or more  Comment - - Emmi Telephone Survey Actual Response =  - -  Injury with Fall? 0 0 - - Yes  Follow up Falls prevention discussed - - Falls evaluation completed -    FALL RISK PREVENTION PERTAINING TO THE HOME:  Any stairs in or around the home? No  If so, are there any without handrails? N/A  Home free of loose throw rugs in walkways, pet beds, electrical cords, etc? Yes  Adequate lighting in your home to reduce risk of falls? Yes   ASSISTIVE DEVICES UTILIZED TO PREVENT FALLS:  Life alert? No  Use of a cane, walker or w/c? Yes  Grab bars in the bathroom? Yes  Shower chair or bench in shower? Yes  Elevated toilet seat or a handicapped toilet? Yes    TIMED UP AND GO:  Was the test performed? No .    Depression Screen PHQ 2/9 Scores 11/15/2019 10/20/2019 12/26/2018 12/15/2018  PHQ - 2 Score 0 0 0 6  PHQ- 9 Score - 1 0 12     Cognitive Function MMSE - Mini Mental State Exam 04/19/2017  Orientation to time 5  Orientation to Place 5  Registration 3  Attention/ Calculation 0  Recall 3  Language- name 2 objects 0  Language- repeat 1  Language- follow 3 step command 3  Language- read & follow direction 0  Write a sentence 0  Copy design 0  Total score 20     6CIT Screen 11/15/2019  What Year? 0 points  What month? 0 points  What time? 0 points  Count back from 20 0 points  Months in reverse 0 points  Repeat phrase 2 points  Total Score  2    Immunization History  Administered Date(s) Administered  . Influenza Split 05/12/2011  . Influenza, High Dose Seasonal PF 05/17/2015, 04/05/2019  . Influenza,inj,Quad PF,6+ Mos 04/24/2016,  04/19/2017  . Influenza-Unspecified 05/20/2013, 05/03/2014, 04/29/2018  . Pneumococcal Conjugate-13 10/26/2013, 05/03/2019  . Pneumococcal Polysaccharide-23 12/03/2014  . Td 09/18/1994, 12/25/2008  . Zoster 10/06/2008    Qualifies for Shingles Vaccine? Yes  Zostavax completed 10/06/08. Due for Shingrix. Pt has been advised to call insurance company to determine out of pocket expense. Advised may also receive vaccine at local pharmacy or Health Dept. Verbalized acceptance and understanding.  Tdap: Although this vaccine is not a covered service during a Wellness Exam, does the patient still wish to receive this vaccine today?  No . Advised may receive this vaccine at local pharmacy or Health Dept. Aware to provide a copy of the vaccination record if obtained from local pharmacy or Health Dept. Verbalized acceptance and understanding.  Flu Vaccine: Up to date  Pneumococcal Vaccine: Completed series  Screening Tests Health Maintenance  Topic Date Due  . COVID-19 Vaccine (1) Never done  . TETANUS/TDAP  11/14/2020 (Originally 12/26/2018)  . INFLUENZA VACCINE  02/18/2020  . DEXA SCAN  06/20/2020  . PNA vac Low Risk Adult  Completed    Cancer Screenings:  Colorectal Screening: No longer required.   Mammogram: No longer required.   Bone Density: Completed 06/20/18. Results reflect OSTEOPOROSIS. Repeat every 2 years.   Lung Cancer Screening: (Low Dose CT Chest recommended if Age 65-80 years, 30 pack-year currently smoking OR have quit w/in 15years.) does not qualify.   Additional Screening:  Vision Screening: Recommended annual ophthalmology exams for early detection of glaucoma and other disorders of the eye.  Dental Screening: Recommended annual dental exams for proper oral hygiene  Community Resource Referral:  CRR required this visit?  No       Plan:  I have personally reviewed and addressed the Medicare Annual Wellness questionnaire and have noted the following in the patient's  chart:  A. Medical and social history B. Use of alcohol, tobacco or illicit drugs  C. Current medications and supplements D. Functional ability and status E.  Nutritional status F.  Physical activity G. Advance directives H. List of other physicians I.  Hospitalizations, surgeries, and ER visits in previous 12 months J.  [place] such as hearing and vision if needed, cognitive and depression L. Referrals and appointments   In addition, I have reviewed and discussed with patient certain preventive protocols, quality metrics, and best practice recommendations. A written personalized care plan for preventive services as well as general preventive health recommendations were provided to patient. Nurse Health Advisor  Signed,    Hever Castilleja Martin, [place]  09/02/863 Nurse Health Advisor   Nurse Notes: None.

## 2019-11-15 ENCOUNTER — Ambulatory Visit (INDEPENDENT_AMBULATORY_CARE_PROVIDER_SITE_OTHER): Payer: PPO

## 2019-11-15 ENCOUNTER — Other Ambulatory Visit: Payer: Self-pay

## 2019-11-15 DIAGNOSIS — Z0000 Encounter for general adult medical examination without abnormal findings: Secondary | ICD-10-CM | POA: Diagnosis not present

## 2019-11-15 NOTE — Patient Instructions (Signed)
Bianca [name] , Thank you for taking time to come for your Medicare Wellness Visit. I appreciate your ongoing commitment to your health goals. Please review the following plan we discussed and let me know if I can assist you in the future.   Screening recommendations/referrals: Colonoscopy: No longer required.  Mammogram: No longer required.  Bone Density: Up to date, due 06/2020 Recommended yearly ophthalmology/optometry visit for glaucoma screening and checkup Recommended yearly dental visit for hygiene and checkup  Vaccinations: Influenza vaccine: Up to date Pneumococcal vaccine: Completed series Tdap vaccine: Pt declines today.  Shingles vaccine: Pt declines today.     Advanced directives: Please bring a copy of your POA (Power of Attorney) and/or Living Will to your next appointment.   Conditions/risks identified: Smoking cessation and fall risk prevention discussed today. Recommend to continue to try and drink 2 boost drinks every day.  Next appointment: 04/22/20 @ 2:40 PM with Carles Collet. Declined scheduling an AWV for 2022 at this time.   Preventive Care 50 Years and Older, Female Preventive care refers to lifestyle choices and visits with your health care provider that can promote health and wellness. What does preventive care include?  A yearly physical exam. This is also called an annual well check.  Dental exams once or twice a year.  Routine eye exams. Ask your health care provider how often you should have your eyes checked.  Personal lifestyle choices, including:  Daily care of your teeth and gums.  Regular physical activity.  Eating a healthy diet.  Avoiding tobacco and drug use.  Limiting alcohol use.  Practicing safe sex.  Taking low-dose aspirin every day.  Taking vitamin and mineral supplements as recommended by your health care provider. What happens during an annual well check? The services and screenings done by your health care provider during  your annual well check will depend on your age, overall health, lifestyle risk factors, and family history of disease. Counseling  Your health care provider may ask you questions about your:  Alcohol use.  Tobacco use.  Drug use.  Emotional well-being.  Home and relationship well-being.  Sexual activity.  Eating habits.  History of falls.  Memory and ability to understand (cognition).  Work and work Statistician.  Reproductive health. Screening  You may have the following tests or measurements:  Height, weight, and BMI.  Blood pressure.  Lipid and cholesterol levels. These may be checked every 5 years, or more frequently if you are over 1 years old.  Skin check.  Lung cancer screening. You may have this screening every year starting at age 14 if you have a 30-pack-year history of smoking and currently smoke or have quit within the past 15 years.  Fecal occult blood test (FOBT) of the stool. You may have this test every year starting at age 58.  Flexible sigmoidoscopy or colonoscopy. You may have a sigmoidoscopy every 5 years or a colonoscopy every 10 years starting at age 57.  Hepatitis C blood test.  Hepatitis B blood test.  Sexually transmitted disease (STD) testing.  Diabetes screening. This is done by checking your blood sugar (glucose) after you have not eaten for a while (fasting). You may have this done every 1-3 years.  Bone density scan. This is done to screen for osteoporosis. You may have this done starting at age 38.  Mammogram. This may be done every 1-2 years. Talk to your health care provider about how often you should have regular mammograms. Talk with your health care  provider about your test results, treatment options, and if necessary, the need for more tests. Vaccines  Your health care provider may recommend certain vaccines, such as:  Influenza vaccine. This is recommended every year.  Tetanus, diphtheria, and acellular pertussis (Tdap,  Td) vaccine. You may need a Td booster every 10 years.  Zoster vaccine. You may need this after age 45.  Pneumococcal 13-valent conjugate (PCV13) vaccine. One dose is recommended after age 57.  Pneumococcal polysaccharide (PPSV23) vaccine. One dose is recommended after age 35. Talk to your health care provider about which screenings and vaccines you need and how often you need them. This information is not intended to replace advice given to you by your health care provider. Make sure you discuss any questions you have with your health care provider. Document Released: 08/02/2015 Document Revised: 03/25/2016 Document Reviewed: 05/07/2015 Elsevier Interactive Patient Education  2017 [place] Prevention in the Home Falls can cause injuries. They can happen to people of all ages. There are many things you can do to make your home safe and to help prevent falls. What can I do on the outside of my home?  Regularly fix the edges of walkways and driveways and fix any cracks.  Remove anything that might make you trip as you walk through a door, such as a raised step or threshold.  Trim any bushes or trees on the path to your home.  Use bright outdoor lighting.  Clear any walking paths of anything that might make someone trip, such as rocks or tools.  Regularly check to see if handrails are loose or broken. Make sure that both sides of any steps have handrails.  Any raised decks and porches should have guardrails on the edges.  Have any leaves, snow, or ice cleared regularly.  Use sand or salt on walking paths during winter.  Clean up any spills in your garage right away. This includes oil or grease spills. What can I do in the bathroom?  Use night lights.  Install grab bars by the toilet and in the tub and shower. Do not use towel bars as grab bars.  Use non-skid mats or decals in the tub or shower.  If you need to sit down in the shower, use a plastic, non-slip  stool.  Keep the floor dry. Clean up any water that spills on the floor as soon as it happens.  Remove soap buildup in the tub or shower regularly.  Attach bath mats securely with double-sided non-slip rug tape.  Do not have throw rugs and other things on the floor that can make you trip. What can I do in the bedroom?  Use night lights.  Make sure that you have a light by your bed that is easy to reach.  Do not use any sheets or blankets that are too big for your bed. They should not hang down onto the floor.  Have a firm chair that has side arms. You can use this for support while you get dressed.  Do not have throw rugs and other things on the floor that can make you trip. What can I do in the kitchen?  Clean up any spills right away.  Avoid walking on wet floors.  Keep items that you use a lot in easy-to-reach places.  If you need to reach something above you, use a strong step stool that has a grab bar.  Keep electrical cords out of the way.  Do not use floor polish  or wax that makes floors slippery. If you must use wax, use non-skid floor wax.  Do not have throw rugs and other things on the floor that can make you trip. What can I do with my stairs?  Do not leave any items on the stairs.  Make sure that there are handrails on both sides of the stairs and use them. Fix handrails that are broken or loose. Make sure that handrails are as long as the stairways.  Check any carpeting to make sure that it is firmly attached to the stairs. Fix any carpet that is loose or worn.  Avoid having throw rugs at the top or bottom of the stairs. If you do have throw rugs, attach them to the floor with carpet tape.  Make sure that you have a light switch at the top of the stairs and the bottom of the stairs. If you do not have them, ask someone to add them for you. What else can I do to help prevent falls?  Wear shoes that:  Do not have high heels.  Have rubber bottoms.  Are  comfortable and fit you well.  Are closed at the toe. Do not wear sandals.  If you use a stepladder:  Make sure that it is fully opened. Do not climb a closed stepladder.  Make sure that both sides of the stepladder are locked into place.  Ask someone to hold it for you, if possible.  Clearly mark and make sure that you can see:  Any grab bars or handrails.  First and last steps.  Where the edge of each step is.  Use tools that help you move around (mobility aids) if they are needed. These include:  Canes.  Walkers.  Scooters.  Crutches.  Turn on the lights when you go into a dark area. Replace any light bulbs as soon as they burn out.  Set up your furniture so you have a clear path. Avoid moving your furniture around.  If any of your floors are uneven, fix them.  If there are any pets around you, be aware of where they are.  Review your medicines with your doctor. Some medicines can make you feel dizzy. This can increase your chance of falling. Ask your doctor what other things that you can do to help prevent falls. This information is not intended to replace advice given to you by your health care provider. Make sure you discuss any questions you have with your health care provider. Document Released: 05/02/2009 Document Revised: 12/12/2015 Document Reviewed: 08/10/2014 Elsevier Interactive Patient Education  2017 [employer].

## 2019-11-16 DIAGNOSIS — H903 Sensorineural hearing loss, bilateral: Secondary | ICD-10-CM | POA: Diagnosis not present

## 2019-11-23 DIAGNOSIS — F4320 Adjustment disorder, unspecified: Secondary | ICD-10-CM | POA: Diagnosis not present

## 2019-11-23 DIAGNOSIS — I129 Hypertensive chronic kidney disease with stage 1 through stage 4 chronic kidney disease, or unspecified chronic kidney disease: Secondary | ICD-10-CM | POA: Diagnosis not present

## 2019-11-23 DIAGNOSIS — H9193 Unspecified hearing loss, bilateral: Secondary | ICD-10-CM | POA: Diagnosis not present

## 2019-11-23 DIAGNOSIS — N1831 Chronic kidney disease, stage 3a: Secondary | ICD-10-CM | POA: Diagnosis not present

## 2019-11-23 DIAGNOSIS — J309 Allergic rhinitis, unspecified: Secondary | ICD-10-CM | POA: Diagnosis not present

## 2019-11-23 DIAGNOSIS — C50412 Malignant neoplasm of upper-outer quadrant of left female breast: Secondary | ICD-10-CM | POA: Diagnosis not present

## 2019-11-23 DIAGNOSIS — Z7951 Long term (current) use of inhaled steroids: Secondary | ICD-10-CM | POA: Diagnosis not present

## 2019-11-23 DIAGNOSIS — M818 Other osteoporosis without current pathological fracture: Secondary | ICD-10-CM | POA: Diagnosis not present

## 2019-11-23 DIAGNOSIS — Z7901 Long term (current) use of anticoagulants: Secondary | ICD-10-CM | POA: Diagnosis not present

## 2019-11-23 DIAGNOSIS — I480 Paroxysmal atrial fibrillation: Secondary | ICD-10-CM | POA: Diagnosis not present

## 2019-11-23 DIAGNOSIS — F17210 Nicotine dependence, cigarettes, uncomplicated: Secondary | ICD-10-CM | POA: Diagnosis not present

## 2019-11-23 DIAGNOSIS — J438 Other emphysema: Secondary | ICD-10-CM | POA: Diagnosis not present

## 2019-11-23 DIAGNOSIS — I951 Orthostatic hypotension: Secondary | ICD-10-CM | POA: Diagnosis not present

## 2019-11-23 DIAGNOSIS — F05 Delirium due to known physiological condition: Secondary | ICD-10-CM | POA: Diagnosis not present

## 2019-11-23 DIAGNOSIS — I340 Nonrheumatic mitral (valve) insufficiency: Secondary | ICD-10-CM | POA: Diagnosis not present

## 2019-12-26 DIAGNOSIS — N1832 Chronic kidney disease, stage 3b: Secondary | ICD-10-CM | POA: Diagnosis not present

## 2019-12-26 DIAGNOSIS — M810 Age-related osteoporosis without current pathological fracture: Secondary | ICD-10-CM | POA: Diagnosis not present

## 2019-12-26 DIAGNOSIS — R636 Underweight: Secondary | ICD-10-CM | POA: Diagnosis not present

## 2019-12-26 DIAGNOSIS — F17200 Nicotine dependence, unspecified, uncomplicated: Secondary | ICD-10-CM | POA: Diagnosis not present

## 2019-12-29 ENCOUNTER — Telehealth: Payer: Self-pay | Admitting: Physician Assistant

## 2019-12-29 NOTE — Telephone Encounter (Signed)
Copied from [place] [phone]. Topic: General - Inquiry >> Dec 29, 2019  3:25 PM Percell Belt A wrote: Reason for CRM:  pt daughter called in and stated that the prolia was $366.39 and pt can not afford this.  She would like to know if there is an assistance program?   Best number [phone]  [facility]

## 2020-01-01 NOTE — Telephone Encounter (Signed)
[email or web address]  Daughter can check out this web site above - appears to be assistance even with medicare. She can contact them and if no assistance available please let provider know, Dr. Gabriel Carina her endocrinologist  may be aware of further help as well as it appears she prescribed.

## 2020-01-04 NOTE — Telephone Encounter (Signed)
Tried Cabin crew at number listed below. Left message to call back. OK for [facility] triage to advise.

## 2020-01-05 ENCOUNTER — Ambulatory Visit: Payer: Self-pay

## 2020-01-05 NOTE — Telephone Encounter (Signed)
If any fever or passing blood, should be seen or go to an Urgent Care Clinic.

## 2020-01-05 NOTE — Telephone Encounter (Signed)
Pt. Daughter Candice called back and given information and web site.

## 2020-01-05 NOTE — Telephone Encounter (Signed)
Attempted to contact pt's daughter.  Left vm to return call to office to receive message from Provider.

## 2020-01-05 NOTE — Telephone Encounter (Signed)
FYI

## 2020-01-05 NOTE — Telephone Encounter (Signed)
Pt. Reports she is having some burning and urinary frequency. Feels like she has a UTI. Offered appointment for today - cannot come in. Requests Monday afternoon. Appointment made.  Reason for Disposition  Urinating more frequently than usual (i.e., frequency)  Answer Assessment - Initial Assessment Questions 1. SYMPTOM: "What's the main symptom you're concerned about?" (e.g., frequency, incontinence)     Frequency and burning 2. ONSET: "When did the  burning start?"     A couple of days ago. 3. PAIN: "Is there any pain?" If so, ask: "How bad is it?" (Scale: 1-10; mild, moderate, severe)     No 4. CAUSE: "What do you think is causing the symptoms?"    UTI 5. OTHER SYMPTOMS: "Do you have any other symptoms?" (e.g., fever, flank pain, blood in urine, pain with urination)     nO 6. PREGNANCY: "Is there any chance you are pregnant?" "When was your last menstrual period?"     No  Protocols used: URINARY [facility]

## 2020-01-08 ENCOUNTER — Encounter: Payer: Self-pay | Admitting: Family Medicine

## 2020-01-08 ENCOUNTER — Other Ambulatory Visit: Payer: Self-pay

## 2020-01-08 ENCOUNTER — Ambulatory Visit (INDEPENDENT_AMBULATORY_CARE_PROVIDER_SITE_OTHER): Payer: PPO | Admitting: Family Medicine

## 2020-01-08 VITALS — BP 126/84 | HR 88 | Temp 96.9°F | Resp 15 | Wt 107.0 lb

## 2020-01-08 DIAGNOSIS — N3090 Cystitis, unspecified without hematuria: Secondary | ICD-10-CM

## 2020-01-08 DIAGNOSIS — M810 Age-related osteoporosis without current pathological fracture: Secondary | ICD-10-CM | POA: Diagnosis not present

## 2020-01-08 LAB — POCT URINALYSIS DIPSTICK
Bilirubin, UA: NEGATIVE
Glucose, UA: NEGATIVE
Ketones, UA: NEGATIVE
Nitrite, UA: POSITIVE
Protein, UA: POSITIVE — AB
Spec Grav, UA: 1.01 (ref 1.010–1.025)
Urobilinogen, UA: 0.2 E.U./dL
pH, UA: 5 (ref 5.0–8.0)

## 2020-01-08 MED ORDER — AMOXICILLIN-POT CLAVULANATE 875-125 MG PO TABS: 1.0000 | ORAL_TABLET | Freq: Two times a day (BID) | ORAL | 0 refills | Status: DC

## 2020-01-08 NOTE — Patient Instructions (Signed)

## 2020-01-08 NOTE — Progress Notes (Signed)
Established patient visit   Patient: Bianca [name]   DOB: 06/15/1941   79 y.o. Female  MRN: [id number] Visit Date: 01/08/2020  Today's healthcare provider: Vernie Murders, PA   Chief Complaint  Patient presents with  . Urinary Frequency   Subjective    Urinary Frequency  This is a new problem. The current episode started in the past 7 days. The problem occurs every urination. The quality of the pain is described as burning. Associated symptoms include frequency, nausea, urgency and vomiting. Pertinent negatives include no chills, discharge, flank pain, hematuria, hesitancy, possible pregnancy or sweats. She has tried increased fluids for the symptoms. The treatment provided no relief.    Past Medical History:  Diagnosis Date  . Anxiety   . Arthritis   . Asthma   . COPD (chronic obstructive pulmonary disease) ([place])   . HOH (hard of hearing)   . Hypertension   . Wears dentures    top and partial bottom  . Wears glasses    Past Surgical History:  Procedure Laterality Date  . APPENDECTOMY    . BREAST LUMPECTOMY WITH NEEDLE LOCALIZATION Right 04/21/2013   Procedure: BREAST LUMPECTOMY WITH NEEDLE LOCALIZATION;  Surgeon: Harl Bowie, MD;  Location: [facility];  Service: General;  Laterality: Right;  . BREAST SURGERY  1985   cyst rt br  . MASTECTOMY W/ SENTINEL NODE BIOPSY Left 04/21/2013   Procedure: MASTECTOMY WITH SENTINEL LYMPH NODE BIOPSY;  Surgeon: Harl Bowie, MD;  Location: [facility];  Service: General;  Laterality: Left;  . OTHER SURGICAL HISTORY     cysts removed from foot  . OTHER SURGICAL HISTORY     cysts removed from breast  . TUBAL LIGATION     Social History   Tobacco Use  . Smoking status: Current Every Day Smoker    Packs/day: 0.25    Types: Cigarettes  . Smokeless tobacco: Never Used  Substance Use Topics  . Alcohol use: No  . Drug use: No   Family Status  Relation Name Status  . Mother  Deceased    . Father  Deceased   No Known Allergies     Medications: Outpatient Medications Prior to Visit  Medication Sig  . ADVAIR DISKUS 100-50 MCG/DOSE AEPB INHALE 1 PUFF TWICE A DAY AS DIRECTED **RINSE MOUTH AFTER USE**  . apixaban (ELIQUIS) 2.5 MG TABS tablet Take 1 tablet (2.5 mg total) by mouth 2 (two) times daily.  . busPIRone (BUSPAR) 15 MG tablet Take 1 tablet (15 mg total) by mouth 2 (two) times daily.  . Calcium-Magnesium (CAL/MAG PO) Take 1 tablet by mouth 3 (three) times a week.  . Cholecalciferol (VITAMIN D3) 50 MCG (2000 UT) TABS Take 2,000 Units by mouth 3 (three) times a week.   . fexofenadine (ALLEGRA) 180 MG tablet Take 180 mg by mouth daily.   Marland Kitchen lactose free nutrition (BOOST) LIQD Take 237 mLs by mouth 2 (two) times daily between meals.  Marland Kitchen losartan (COZAAR) 25 MG tablet Take 1/2 tablet as needed for systolic blood pressure over 180.  . midodrine (PROAMATINE) 5 MG tablet Take one tablet by mouth as needed for dizziness/lightheadedness.  May take up to 4 tablets in a 24 hour period.  . mirtazapine (REMERON) 15 MG tablet Take 1 tablet (15 mg total) by mouth at bedtime.  . Misc Natural Products (TART CHERRY ADVANCED) CAPS Take 1-2 capsules by mouth 3 (three) times a week.  . Multiple Vitamins-Minerals (CENTRUM  SILVER 50+WOMEN) TABS Take 1 tablet by mouth 3 (three) times a week.  . Omega-3 Fatty Acids (OMEGA 3 PO) Take 1 capsule by mouth daily.   Marland Kitchen PROAIR HFA 108 (90 Base) MCG/ACT inhaler INHALE 1-2 PUFFS INTO THE LUNGS EVERY 4 HOURS AS NEEDED  . PROLIA 60 MG/ML SOSY injection SMARTSIG:1 Milliliter(s) SUB-Q Twice a Year  . UNABLE TO FIND Rx: [id number]- Post Surgical Bras (Quantity: 6)\ Dx: 174.9; Left mastectomy  . vitamin C (ASCORBIC ACID) 500 MG tablet Take 500-1,000 mg by mouth 3 (three) times a week.   No facility-administered medications prior to visit.    Review of Systems  Constitutional: Negative for chills.  HENT: Negative.   Gastrointestinal: Positive for nausea and  vomiting.  Genitourinary: Positive for frequency, pelvic pain, urgency and vaginal bleeding. Negative for decreased urine volume, flank pain, hematuria, hesitancy, vaginal discharge and vaginal pain.  Musculoskeletal: Positive for back pain.      Objective    BP 126/84   Pulse 88   Temp (!) 96.9 F (36.1 C) (Oral)   Resp 15   Wt 107 lb (48.5 kg)   SpO2 98%   BMI 17.53 kg/m  Wt Readings from Last 3 Encounters:  01/08/20 107 lb (48.5 kg)  10/20/19 110 lb 9.6 oz (50.2 kg)  10/18/19 111 lb (50.3 kg)     Physical Exam Constitutional:      General: She is not in acute distress.    Appearance: She is well-developed.  HENT:     Head: Normocephalic and atraumatic.     Right Ear: Hearing normal.     Left Ear: Hearing normal.     Nose: Nose normal.  Eyes:     General: Lids are normal. No scleral icterus.       Right eye: No discharge.        Left eye: No discharge.     Conjunctiva/sclera: Conjunctivae normal.  Cardiovascular:     Rate and Rhythm: Normal rate and regular rhythm.     Heart sounds: Normal heart sounds.  Pulmonary:     Effort: Pulmonary effort is normal. No respiratory distress.     Breath sounds: Normal breath sounds.  Abdominal:     General: Bowel sounds are normal.     Palpations: Abdomen is soft.     Comments: Bladder tenderness without CVA tenderness bilaterally.  Musculoskeletal:        General: Normal range of motion.     Cervical back: Neck supple.  Skin:    Findings: No lesion or rash.  Neurological:     Mental Status: She is alert and oriented to person, place, and time.  Psychiatric:        Speech: Speech normal.        Behavior: Behavior normal.        Thought Content: Thought content normal.       No results found for any visits on 01/08/20.  Assessment & Plan     1. Cystitis Developed burning with urination and frequency over the past 5-7 days. No fever or hematuria. Urinalysis showed positive nitrites,  3+leukocytes and positive  protein. Encouraged to drink extra fluids, Unable to get a urine quantity sufficient to get a urine culture. Will treat with Augmentin and may add AZO-Standard for discomfort. Recheck in a week if no better.  - POCT urinalysis dipstick - amoxicillin-clavulanate (AUGMENTIN) 875-125 MG tablet; Take 1 tablet by mouth 2 (two) times daily.  Dispense: 14 tablet; Refill: 0  No follow-ups on file.      Andres Shad, PA, have reviewed all documentation for this visit. The documentation on 01/08/20 for the exam, diagnosis, procedures, and orders are all accurate and complete.    Vernie Murders, [place] [phone] (phone) [phone] (fax)  [place]

## 2020-01-18 ENCOUNTER — Ambulatory Visit: Payer: PPO | Admitting: Physician Assistant

## 2020-01-18 NOTE — Progress Notes (Deleted)
 {  This patient's chart is due for periodic physician review. Please check 'Cosign Required' and forward to your supervising physician.:1}  Established patient visit   Patient: Bianca [name]   DOB: 21-Mar-1941   78 y.o. Female  MRN: [id number] Visit Date: 01/18/2020  Today's healthcare provider: Trinna Post, PA-C   No chief complaint on file.  Subjective    HPI    {Show patient history (optional):23778::" "}   Medications: Outpatient Medications Prior to Visit  Medication Sig  . ADVAIR DISKUS 100-50 MCG/DOSE AEPB INHALE 1 PUFF TWICE A DAY AS DIRECTED **RINSE MOUTH AFTER USE**  . amoxicillin-clavulanate (AUGMENTIN) 875-125 MG tablet Take 1 tablet by mouth 2 (two) times daily.  Marland Kitchen apixaban (ELIQUIS) 2.5 MG TABS tablet Take 1 tablet (2.5 mg total) by mouth 2 (two) times daily.  . busPIRone (BUSPAR) 15 MG tablet Take 1 tablet (15 mg total) by mouth 2 (two) times daily.  . Calcium-Magnesium (CAL/MAG PO) Take 1 tablet by mouth 3 (three) times a week.  . Cholecalciferol (VITAMIN D3) 50 MCG (2000 UT) TABS Take 2,000 Units by mouth 3 (three) times a week.   . fexofenadine (ALLEGRA) 180 MG tablet Take 180 mg by mouth daily.   Marland Kitchen lactose free nutrition (BOOST) LIQD Take 237 mLs by mouth 2 (two) times daily between meals.  Marland Kitchen losartan (COZAAR) 25 MG tablet Take 1/2 tablet as needed for systolic blood pressure over 180.  . midodrine (PROAMATINE) 5 MG tablet Take one tablet by mouth as needed for dizziness/lightheadedness.  May take up to 4 tablets in a 24 hour period.  . mirtazapine (REMERON) 15 MG tablet Take 1 tablet (15 mg total) by mouth at bedtime.  . Misc Natural Products (TART CHERRY ADVANCED) CAPS Take 1-2 capsules by mouth 3 (three) times a week.  . Multiple Vitamins-Minerals (CENTRUM SILVER 50+WOMEN) TABS Take 1 tablet by mouth 3 (three) times a week.  . Omega-3 Fatty Acids (OMEGA 3 PO) Take 1 capsule by mouth daily.   Marland Kitchen PROAIR HFA 108 (90 Base) MCG/ACT inhaler INHALE 1-2 PUFFS  INTO THE LUNGS EVERY 4 HOURS AS NEEDED  . PROLIA 60 MG/ML SOSY injection SMARTSIG:1 Milliliter(s) SUB-Q Twice a Year  . UNABLE TO FIND Rx: [id number]- Post Surgical Bras (Quantity: 6)\ Dx: 174.9; Left mastectomy  . vitamin C (ASCORBIC ACID) 500 MG tablet Take 500-1,000 mg by mouth 3 (three) times a week.   No facility-administered medications prior to visit.    Review of Systems  {Heme  Chem  Endocrine  Serology  Results Review (optional):23779::" "}  Objective    There were no vitals taken for this visit. {Show previous vital signs (optional):23777::" "}  Physical Exam  ***  No results found for any visits on 01/18/20.  Assessment & Plan     ***  No follow-ups on file.      {provider attestation***:1}   Paulene Floor  [facility] [phone] (phone) [phone] (fax)  [place]

## 2020-01-19 ENCOUNTER — Other Ambulatory Visit: Payer: Self-pay

## 2020-01-19 ENCOUNTER — Telehealth: Payer: Self-pay

## 2020-01-19 ENCOUNTER — Ambulatory Visit: Payer: PPO | Admitting: Family Medicine

## 2020-01-19 DIAGNOSIS — J189 Pneumonia, unspecified organism: Secondary | ICD-10-CM | POA: Diagnosis not present

## 2020-01-19 DIAGNOSIS — J441 Chronic obstructive pulmonary disease with (acute) exacerbation: Secondary | ICD-10-CM | POA: Diagnosis not present

## 2020-01-19 NOTE — Telephone Encounter (Signed)
Copied from [place] [id number]. Topic: General - Other >> Jan 19, 2020 11:12 AM Celene Kras wrote: Reason for CRM: Pts daughter called and is requesting to speak with supervisor regarding pt being sent away today for her appt. Pts daughter is upset and is requesting a call back today. Please advise.

## 2020-01-19 NOTE — Progress Notes (Deleted)
     Established patient visit   Patient: Bianca [name]   DOB: Mar 13, 1941   79 y.o. Female  MRN: [id number] Visit Date: 01/19/2020  Today's healthcare provider: Lelon Huh, MD   No chief complaint on file.  Subjective    HPI  Weight Loss:  Patient presents today C/O weight loss. She reports she is eating healthy and a adequate intake.   {Show patient history (optional):23778::" "}   Medications: Outpatient Medications Prior to Visit  Medication Sig  . ADVAIR DISKUS 100-50 MCG/DOSE AEPB INHALE 1 PUFF TWICE A DAY AS DIRECTED **RINSE MOUTH AFTER USE**  . amoxicillin-clavulanate (AUGMENTIN) 875-125 MG tablet Take 1 tablet by mouth 2 (two) times daily.  Marland Kitchen apixaban (ELIQUIS) 2.5 MG TABS tablet Take 1 tablet (2.5 mg total) by mouth 2 (two) times daily.  . busPIRone (BUSPAR) 15 MG tablet Take 1 tablet (15 mg total) by mouth 2 (two) times daily.  . Calcium-Magnesium (CAL/MAG PO) Take 1 tablet by mouth 3 (three) times a week.  . Cholecalciferol (VITAMIN D3) 50 MCG (2000 UT) TABS Take 2,000 Units by mouth 3 (three) times a week.   . fexofenadine (ALLEGRA) 180 MG tablet Take 180 mg by mouth daily.   Marland Kitchen lactose free nutrition (BOOST) LIQD Take 237 mLs by mouth 2 (two) times daily between meals.  Marland Kitchen losartan (COZAAR) 25 MG tablet Take 1/2 tablet as needed for systolic blood pressure over 180.  . midodrine (PROAMATINE) 5 MG tablet Take one tablet by mouth as needed for dizziness/lightheadedness.  May take up to 4 tablets in a 24 hour period.  . mirtazapine (REMERON) 15 MG tablet Take 1 tablet (15 mg total) by mouth at bedtime.  . Misc Natural Products (TART CHERRY ADVANCED) CAPS Take 1-2 capsules by mouth 3 (three) times a week.  . Multiple Vitamins-Minerals (CENTRUM SILVER 50+WOMEN) TABS Take 1 tablet by mouth 3 (three) times a week.  . Omega-3 Fatty Acids (OMEGA 3 PO) Take 1 capsule by mouth daily.   Marland Kitchen PROAIR HFA 108 (90 Base) MCG/ACT inhaler INHALE 1-2 PUFFS INTO THE LUNGS EVERY 4 HOURS AS  NEEDED  . PROLIA 60 MG/ML SOSY injection SMARTSIG:1 Milliliter(s) SUB-Q Twice a Year  . UNABLE TO FIND Rx: [id number]- Post Surgical Bras (Quantity: 6)\ Dx: 174.9; Left mastectomy  . vitamin C (ASCORBIC ACID) 500 MG tablet Take 500-1,000 mg by mouth 3 (three) times a week.   No facility-administered medications prior to visit.    Review of Systems  Constitutional: Positive for unexpected weight change.  Respiratory: Negative.   Cardiovascular: Negative.   Musculoskeletal: Negative.     {Heme  Chem  Endocrine  Serology  Results Review (optional):23779::" "}  Objective    There were no vitals taken for this visit. {Show previous vital signs (optional):23777::" "}  Physical Exam  ***  No results found for any visits on 01/19/20.  Assessment & Plan     ***  No follow-ups on file.      {provider attestation***:1}   Lelon Huh, MD  [facility] [phone] (phone) [phone] (fax)  [place]

## 2020-01-25 ENCOUNTER — Ambulatory Visit: Payer: PPO | Admitting: Physician Assistant

## 2020-01-25 NOTE — Progress Notes (Deleted)
 {  This patient's chart is due for periodic physician review. Please check 'Cosign Required' and forward to your supervising physician.:1}  Established patient visit   Patient: Bianca [name]   DOB: [birth year]   79 y.o. Female  MRN: [id number] Visit Date: 01/25/2020  Today's healthcare provider: Trinna Post, PA-C   No chief complaint on file.  Subjective    HPI  Patient presents today wanting to have her thyroid levels checked. She does not have a past medical history of this.   {Show patient history (optional):23778::" "}   Medications: Outpatient Medications Prior to Visit  Medication Sig  . ADVAIR DISKUS 100-50 MCG/DOSE AEPB INHALE 1 PUFF TWICE A DAY AS DIRECTED **RINSE MOUTH AFTER USE**  . amoxicillin-clavulanate (AUGMENTIN) 875-125 MG tablet Take 1 tablet by mouth 2 (two) times daily.  Marland Kitchen apixaban (ELIQUIS) 2.5 MG TABS tablet Take 1 tablet (2.5 mg total) by mouth 2 (two) times daily.  . busPIRone (BUSPAR) 15 MG tablet Take 1 tablet (15 mg total) by mouth 2 (two) times daily.  . Calcium-Magnesium (CAL/MAG PO) Take 1 tablet by mouth 3 (three) times a week.  . Cholecalciferol (VITAMIN D3) 50 MCG (2000 UT) TABS Take 2,000 Units by mouth 3 (three) times a week.   . fexofenadine (ALLEGRA) 180 MG tablet Take 180 mg by mouth daily.   Marland Kitchen lactose free nutrition (BOOST) LIQD Take 237 mLs by mouth 2 (two) times daily between meals.  Marland Kitchen losartan (COZAAR) 25 MG tablet Take 1/2 tablet as needed for systolic blood pressure over 180.  . midodrine (PROAMATINE) 5 MG tablet Take one tablet by mouth as needed for dizziness/lightheadedness.  May take up to 4 tablets in a 24 hour period.  . mirtazapine (REMERON) 15 MG tablet Take 1 tablet (15 mg total) by mouth at bedtime.  . Misc Natural Products (TART CHERRY ADVANCED) CAPS Take 1-2 capsules by mouth 3 (three) times a week.  . Multiple Vitamins-Minerals (CENTRUM SILVER 50+WOMEN) TABS Take 1 tablet by mouth 3 (three) times a week.  . Omega-3 Fatty  Acids (OMEGA 3 PO) Take 1 capsule by mouth daily.   Marland Kitchen PROAIR HFA 108 (90 Base) MCG/ACT inhaler INHALE 1-2 PUFFS INTO THE LUNGS EVERY 4 HOURS AS NEEDED  . PROLIA 60 MG/ML SOSY injection SMARTSIG:1 Milliliter(s) SUB-Q Twice a Year  . UNABLE TO FIND Rx: [id number]- Post Surgical Bras (Quantity: 6)\ Dx: 174.9; Left mastectomy  . vitamin C (ASCORBIC ACID) 500 MG tablet Take 500-1,000 mg by mouth 3 (three) times a week.   No facility-administered medications prior to visit.    Review of Systems  {Heme  Chem  Endocrine  Serology  Results Review (optional):23779::" "}  Objective    There were no vitals taken for this visit. {Show previous vital signs (optional):23777::" "}  Physical Exam  ***  No results found for any visits on 01/25/20.  Assessment & Plan     ***  No follow-ups on file.      {provider attestation***:1}   Paulene Floor  [facility] [phone] (phone) [phone] (fax)  [place]

## 2020-01-29 NOTE — Progress Notes (Signed)
Established patient visit   Patient: Bianca [name]   DOB: [birth year]   79 y.o. Female  MRN: [id number] Visit Date: 01/30/2020  Today's healthcare provider: Trinna Post, PA-C   Chief Complaint  Patient presents with  . Weight Loss   Subjective    HPI   Weight Loss  Patient had been seen in the clinic in 10/2019 as a new patient. She has suffered from low weight for at least two years and was treated for this previously by her PCP. She is currently treated for depression and anxiety with buspar and remeron. This has not brought about weight loss. IN fact, in the interim she has lost 5 lbs. She reports low appetite. She does not eat frequently and eats small meals when she does. She denies fevers, chills, nausea, vomiting. She has a history of COPD and smoking. Between her last visit, she has been treated for presumed pneumonia and cough by urgent care with two rounds of antibiotics but reports a persistent cough. She did not have a chest xray at the time. Patient's family want to check thyroid and cbc because she has a history of anemia.    Lab Results  Component Value Date   TSH 2.700 01/30/2020   TSH 3.393 01/21/2019   TSH 2.75 01/05/2017   FREET4 1.19 (H) 01/21/2019   Wt Readings from Last 3 Encounters:  01/30/20 105 lb (47.6 kg)  01/08/20 107 lb (48.5 kg)  10/20/19 110 lb 9.6 oz (50.2 kg)    Symptoms: No change in energy level No constipation  No diarrhea No heat / cold intolerance  No nervousness No palpitations  Yes weight changes       Medications: Outpatient Medications Prior to Visit  Medication Sig  . ADVAIR DISKUS 100-50 MCG/DOSE AEPB INHALE 1 PUFF TWICE A DAY AS DIRECTED **RINSE MOUTH AFTER USE**  . amoxicillin-clavulanate (AUGMENTIN) 875-125 MG tablet Take 1 tablet by mouth 2 (two) times daily.  Marland Kitchen apixaban (ELIQUIS) 2.5 MG TABS tablet Take 1 tablet (2.5 mg total) by mouth 2 (two) times daily.  . busPIRone (BUSPAR) 15 MG tablet Take 1 tablet (15 mg  total) by mouth 2 (two) times daily.  . Calcium-Magnesium (CAL/MAG PO) Take 1 tablet by mouth 3 (three) times a week.  . Cholecalciferol (VITAMIN D3) 50 MCG (2000 UT) TABS Take 2,000 Units by mouth 3 (three) times a week.   . fexofenadine (ALLEGRA) 180 MG tablet Take 180 mg by mouth daily.   Marland Kitchen lactose free nutrition (BOOST) LIQD Take 237 mLs by mouth 2 (two) times daily between meals.  Marland Kitchen losartan (COZAAR) 25 MG tablet Take 1/2 tablet as needed for systolic blood pressure over 180.  . midodrine (PROAMATINE) 5 MG tablet Take one tablet by mouth as needed for dizziness/lightheadedness.  May take up to 4 tablets in a 24 hour period.  . mirtazapine (REMERON) 15 MG tablet Take 1 tablet (15 mg total) by mouth at bedtime.  . Misc Natural Products (TART CHERRY ADVANCED) CAPS Take 1-2 capsules by mouth 3 (three) times a week.  . Multiple Vitamins-Minerals (CENTRUM SILVER 50+WOMEN) TABS Take 1 tablet by mouth 3 (three) times a week.  . Omega-3 Fatty Acids (OMEGA 3 PO) Take 1 capsule by mouth daily.   Marland Kitchen PROAIR HFA 108 (90 Base) MCG/ACT inhaler INHALE 1-2 PUFFS INTO THE LUNGS EVERY 4 HOURS AS NEEDED  . PROLIA 60 MG/ML SOSY injection SMARTSIG:1 Milliliter(s) SUB-Q Twice a Year  . UNABLE TO FIND Rx:  [id number]- Post Surgical Bras (Quantity: 6)\ Dx: 174.9; Left mastectomy  . vitamin C (ASCORBIC ACID) 500 MG tablet Take 500-1,000 mg by mouth 3 (three) times a week.   No facility-administered medications prior to visit.    Review of Systems    Objective    BP (!) 150/84   Pulse (!) 102   Temp 97.6 F (36.4 C)   Ht 5\' 5"  (1.651 m)   Wt 105 lb (47.6 kg)   BMI 17.47 kg/m    Physical Exam Constitutional:      Appearance: She is underweight.  Cardiovascular:     Rate and Rhythm: Normal rate and regular rhythm.     Heart sounds: Normal heart sounds.  Pulmonary:     Effort: Pulmonary effort is normal.     Breath sounds: Normal breath sounds.  Skin:    General: Skin is warm and dry.  Neurological:      Mental Status: She is alert and oriented to person, place, and time. Mental status is at baseline.  Psychiatric:        Mood and Affect: Mood is depressed.        Behavior: Behavior normal.       Results for orders placed or performed in visit on 01/30/20  TSH  Result Value Ref Range   TSH 2.700 0.450 - 4.500 uIU/mL  CBC with Differential/Platelet  Result Value Ref Range   WBC 8.8 3.4 - 10.8 x10E3/uL   RBC 4.11 3.77 - 5.28 x10E6/uL   Hemoglobin 10.8 (L) 11.1 - 15.9 g/dL   Hematocrit 33.8 (L) 34.0 - 46.6 %   MCV 82 79 - 97 fL   MCH 26.3 (L) 26.6 - 33.0 pg   MCHC 32.0 31 - 35 g/dL   RDW 13.8 11.7 - 15.4 %   Platelets 475 (H) 150 - 450 x10E3/uL   Neutrophils 70 Not Estab. %   Lymphs 19 Not Estab. %   Monocytes 9 Not Estab. %   Eos 1 Not Estab. %   Basos 1 Not Estab. %   Neutrophils Absolute 6.1 1 - 7 x10E3/uL   Lymphocytes Absolute 1.6 0 - 3 x10E3/uL   Monocytes Absolute 0.8 0 - 0 x10E3/uL   EOS (ABSOLUTE) 0.1 0.0 - 0.4 x10E3/uL   Basophils Absolute 0.1 0 - 0 x10E3/uL   Immature Granulocytes 0 Not Estab. %   Immature Grans (Abs) 0.0 0.0 - 0.1 x10E3/uL    Assessment & Plan    1. Weight loss Patient has experienced weight loss ongoing several years. She is on remeron without success in increase in appetite. She has been on dronabinol previously. Reports depressive symptoms and will be referred to therapy. Remains on medication treatment. Discussed potential malignancy work up as this has not been done prior with CT chest and CT abdomen. Patient initially deferred during office visit.  However, on follow up CXR for cough there is a concerning 5 cm left lower lobe cavitation mass that will require CT scan for follow up. I have called patient and her daughter Bianca [name] personally to discuss the results with them. Discussed how I cannot definitively say this is cancer and we would need further imaging and potentially a biopsy. All questions answered. Advised they may feel free to  contact the clinic or MyChart message me if further questions arise.   - TSH - CBC with Differential/Platelet  2. Other depression  - Ambulatory referral to [facility]  3. Cough  - DG Chest 2  View; Future  4. 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 Solitary pulmonary nodule  - CT CHEST W CONTRAST    Return if symptoms worsen or fail to improve.      ITrinna Post, PA-C, have reviewed all documentation for this visit. The documentation on 02/01/20 for the exam, diagnosis, procedures, and orders are all accurate and complete.    Paulene Floor  [facility] [phone] (phone) [phone] (fax)  [place]

## 2020-01-30 ENCOUNTER — Encounter: Payer: Self-pay | Admitting: Physician Assistant

## 2020-01-30 ENCOUNTER — Ambulatory Visit (INDEPENDENT_AMBULATORY_CARE_PROVIDER_SITE_OTHER): Payer: PPO | Admitting: Physician Assistant

## 2020-01-30 ENCOUNTER — Ambulatory Visit
Admission: RE | Admit: 2020-01-30 | Discharge: 2020-01-30 | Disposition: A | Discharge status: Home / Self Care | Payer: PPO | Source: Ambulatory Visit | Attending: Physician Assistant | Admitting: Physician Assistant

## 2020-01-30 ENCOUNTER — Ambulatory Visit
Admission: RE | Admit: 2020-01-30 | Discharge: 2020-01-30 | Disposition: A | Discharge status: Home / Self Care | Payer: PPO | Attending: Physician Assistant | Admitting: Physician Assistant

## 2020-01-30 ENCOUNTER — Other Ambulatory Visit: Payer: Self-pay

## 2020-01-30 VITALS — BP 150/84 | HR 102 | Temp 97.6°F | Ht 65.0 in | Wt 105.0 lb

## 2020-01-30 DIAGNOSIS — R911 Solitary pulmonary nodule: Secondary | ICD-10-CM

## 2020-01-30 DIAGNOSIS — J984 Other disorders of lung: Secondary | ICD-10-CM

## 2020-01-30 DIAGNOSIS — R05 Cough: Secondary | ICD-10-CM | POA: Insufficient documentation

## 2020-01-30 DIAGNOSIS — F3289 Other specified depressive episodes: Secondary | ICD-10-CM | POA: Diagnosis not present

## 2020-01-30 DIAGNOSIS — R059 Cough, unspecified: Secondary | ICD-10-CM

## 2020-01-30 DIAGNOSIS — J439 Emphysema, unspecified: Secondary | ICD-10-CM | POA: Diagnosis not present

## 2020-01-30 DIAGNOSIS — R634 Abnormal weight loss: Secondary | ICD-10-CM | POA: Diagnosis not present

## 2020-01-31 ENCOUNTER — Telehealth: Payer: Self-pay | Admitting: Physician Assistant

## 2020-01-31 LAB — CBC WITH DIFFERENTIAL/PLATELET
Basophils Absolute: 0.1 10*3/uL (ref 0.0–0.2)
Basos: 1 %
EOS (ABSOLUTE): 0.1 10*3/uL (ref 0.0–0.4)
Eos: 1 %
Hematocrit: 33.8 % — ABNORMAL LOW (ref 34.0–46.6)
Hemoglobin: 10.8 g/dL — ABNORMAL LOW (ref 11.1–15.9)
Immature Grans (Abs): 0 10*3/uL (ref 0.0–0.1)
Immature Granulocytes: 0 %
Lymphocytes Absolute: 1.6 10*3/uL (ref 0.7–3.1)
Lymphs: 19 %
MCH: 26.3 pg — ABNORMAL LOW (ref 26.6–33.0)
MCHC: 32 g/dL (ref 31.5–35.7)
MCV: 82 fL (ref 79–97)
Monocytes Absolute: 0.8 10*3/uL (ref 0.1–0.9)
Monocytes: 9 %
Neutrophils Absolute: 6.1 10*3/uL (ref 1.4–7.0)
Neutrophils: 70 %
Platelets: 475 10*3/uL — ABNORMAL HIGH (ref 150–450)
RBC: 4.11 x10E6/uL (ref 3.77–5.28)
RDW: 13.8 % (ref 11.7–15.4)
WBC: 8.8 10*3/uL (ref 3.4–10.8)

## 2020-01-31 LAB — TSH: TSH: 2.7 u[IU]/mL (ref 0.450–4.500)

## 2020-01-31 NOTE — Telephone Encounter (Signed)
'  Opal Sidles' with [facility] calling report on pts CXR:  " Left lower lobe with mass like opacity. Recommend further evaluation with chest CT."  Practice closed for lunch presently.

## 2020-01-31 NOTE — Telephone Encounter (Signed)
Adriana-full report is available in chart

## 2020-02-01 ENCOUNTER — Encounter: Payer: Self-pay | Admitting: Physician Assistant

## 2020-02-05 ENCOUNTER — Ambulatory Visit: Payer: Self-pay | Admitting: *Deleted

## 2020-02-05 DIAGNOSIS — F3289 Other specified depressive episodes: Secondary | ICD-10-CM

## 2020-02-05 DIAGNOSIS — N1831 Chronic kidney disease, stage 3a: Secondary | ICD-10-CM

## 2020-02-05 NOTE — Chronic Care Management (AMB) (Signed)
Chronic Care Management    Clinical Social Work General Note  02/05/2020 Name: Bianca [name] MRN: [id number] DOB: 07/18/1941  Bianca [name] is a 79 y.o. year old female who is a primary care patient of Trinna Post, [place]. The CCM was consulted to assist the patient with Mental Health Counseling and Resources.   [name] was given information about Chronic Care Management services today including:  1. CCM service includes personalized support from designated clinical staff supervised by her physician, including individualized plan of care and coordination with other care providers 2. 24/7 contact phone numbers for assistance for urgent and routine care needs. 3. Service will only be billed when office clinical staff spend 20 minutes or more in a month to coordinate care. 4. Only one practitioner may furnish and bill the service in a calendar month. 5. The patient may stop CCM services at any time (effective at the end of the month) by phone call to the office staff. 6. The patient will be responsible for cost sharing (co-pay) of up to 20% of the service fee (after annual deductible is met).  Patient agreed to services and verbal consent obtained.   Review of patient status, including review of consultants reports, relevant laboratory and other test results, and collaboration with appropriate care team members and the patient's provider was performed as part of comprehensive patient evaluation and provision of chronic care management services.    SDOH (Social Determinants of Health) assessments and interventions performed:  Yes SDOH Interventions     Most Recent Value  SDOH Interventions  SDOH Interventions for the Following Domains Depression  Depression Interventions/Treatment  Counseling  [referral for ongoing counseling to be set up]       Outpatient Encounter Medications as of 02/05/2020  Medication Sig  . ADVAIR DISKUS 100-50 MCG/DOSE AEPB INHALE 1 PUFF TWICE A DAY AS  DIRECTED **RINSE MOUTH AFTER USE**  . amoxicillin-clavulanate (AUGMENTIN) 875-125 MG tablet Take 1 tablet by mouth 2 (two) times daily.  Marland Kitchen apixaban (ELIQUIS) 2.5 MG TABS tablet Take 1 tablet (2.5 mg total) by mouth 2 (two) times daily.  . busPIRone (BUSPAR) 15 MG tablet Take 1 tablet (15 mg total) by mouth 2 (two) times daily.  . Calcium-Magnesium (CAL/MAG PO) Take 1 tablet by mouth 3 (three) times a week.  . Cholecalciferol (VITAMIN D3) 50 MCG (2000 UT) TABS Take 2,000 Units by mouth 3 (three) times a week.   . fexofenadine (ALLEGRA) 180 MG tablet Take 180 mg by mouth daily.   Marland Kitchen lactose free nutrition (BOOST) LIQD Take 237 mLs by mouth 2 (two) times daily between meals.  Marland Kitchen losartan (COZAAR) 25 MG tablet Take 1/2 tablet as needed for systolic blood pressure over 180.  . midodrine (PROAMATINE) 5 MG tablet Take one tablet by mouth as needed for dizziness/lightheadedness.  May take up to 4 tablets in a 24 hour period.  . mirtazapine (REMERON) 15 MG tablet Take 1 tablet (15 mg total) by mouth at bedtime.  . Misc Natural Products (TART CHERRY ADVANCED) CAPS Take 1-2 capsules by mouth 3 (three) times a week.  . Multiple Vitamins-Minerals (CENTRUM SILVER 50+WOMEN) TABS Take 1 tablet by mouth 3 (three) times a week.  . Omega-3 Fatty Acids (OMEGA 3 PO) Take 1 capsule by mouth daily.   Marland Kitchen PROAIR HFA 108 (90 Base) MCG/ACT inhaler INHALE 1-2 PUFFS INTO THE LUNGS EVERY 4 HOURS AS NEEDED  . PROLIA 60 MG/ML SOSY injection SMARTSIG:1 Milliliter(s) SUB-Q Twice a Year  .  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 vitamin C (ASCORBIC ACID) 500 MG tablet Take 500-1,000 mg by mouth 3 (three) times a week.   No facility-administered encounter medications on file as of 02/05/2020.    Goals Addressed            This Visit's Progress   . "I think I would like to be in therapy"       CARE PLAN ENTRY (see longitudinal plan of care for additional care plan  information)  Current Barriers:  Marland Kitchen Mental Health Concerns  related to the loss of her spouse, and her medical concerns  Clinical Social Work Clinical Goal(s):  Marland Kitchen Over the next 90 days, patient will follow up with a mental health therapist for ongoing mental health treatment  as directed by SW  Interventions: . Patient interviewed and appropriate assessments performed . Patient discussed feelings of depression related to loss of her spouse a few ago, and difficulty dealing with her new medical diagnosis . Explored with patient need for ongoing therapy to address depressed mood . Provided patient with emotional support . Discussed plans with patient for ongoing care management follow up and provided patient with direct contact information for care management team . Will refer patient to an in network provider for long term follow up and therapy/counseling  Patient Self Care Activities:  . Patient verbalizes understanding of plan to follow up with a in network provider . Unable to identify in network mental health providers  Initial goal documentation         Follow Up Plan: SW will follow up with patient by phone over the next 7-14 business days        [place], [place] Worker  [facility] [phone]

## 2020-02-05 NOTE — Patient Instructions (Signed)
Thank you allowing the Chronic Care Management Team to be a part of your care! It was a pleasure speaking with you today!   Ms. Halvorsen was given information about Chronic Care Management services today including:  1. CCM service includes personalized support from designated clinical staff supervised by her physician, including individualized plan of care and coordination with other care providers 2. 24/7 contact phone numbers for assistance for urgent and routine care needs. 3. Service will only be billed when office clinical staff spend 20 minutes or more in a month to coordinate care. 4. Only one practitioner may furnish and bill the service in a calendar month. 5. The patient may stop CCM services at any time (effective at the end of the month) by phone call to the office staff. 6. The patient will be responsible for cost sharing (co-pay) of up to 20% of the service fee (after annual deductible is met).    CCM (Chronic Care Management) Team   Neldon Labella RN, BSN Nurse Care Coordinator  [phone]  [place], LCSW Clinical Social Worker [phone]  Goals Addressed            This Visit's Progress   . "I think I would like to be in therapy"       CARE PLAN ENTRY (see longitudinal plan of care for additional care plan information)  Current Barriers:  Marland Kitchen Mental Health Concerns  related to the loss of her spouse, and her medical concerns  Clinical Social Work Clinical Goal(s):  Marland Kitchen Over the next 90 days, patient will follow up with a mental health therapist for ongoing mental health treatment  as directed by SW  Interventions: . Patient interviewed and appropriate assessments performed . Patient discussed feelings of depression related to loss of her spouse a few ago, and difficulty dealing with her new medical diagnosis . Explored with patient need for ongoing therapy to address depressed mood . Provided patient with emotional support . Discussed plans with  patient for ongoing care management follow up and provided patient with direct contact information for care management team . Will refer patient to an in network provider for long term follow up and therapy/counseling  Patient Self Care Activities:  . Patient verbalizes understanding of plan to follow up with a in network provider . Unable to identify in network mental health providers  Initial goal documentation         The patient verbalized understanding of instructions provided today and declined a print copy of patient instruction materials.   Telephone follow up appointment with care management team member scheduled for: 02/13/20

## 2020-02-11 ENCOUNTER — Emergency Department (HOSPITAL_COMMUNITY): Payer: PPO

## 2020-02-11 ENCOUNTER — Other Ambulatory Visit: Payer: Self-pay

## 2020-02-11 ENCOUNTER — Encounter (HOSPITAL_COMMUNITY): Payer: Self-pay | Admitting: Internal Medicine

## 2020-02-11 ENCOUNTER — Inpatient Hospital Stay (HOSPITAL_COMMUNITY): Payer: PPO

## 2020-02-11 ENCOUNTER — Inpatient Hospital Stay (HOSPITAL_COMMUNITY)
Admission: EM | Admit: 2020-02-11 | Discharge: 2020-02-23 | DRG: 521 | Disposition: A | Discharge status: Skilled Nursing Facility | Payer: PPO | Attending: Internal Medicine | Admitting: Internal Medicine

## 2020-02-11 DIAGNOSIS — E43 Unspecified severe protein-calorie malnutrition: Secondary | ICD-10-CM | POA: Insufficient documentation

## 2020-02-11 DIAGNOSIS — Z7902 Long term (current) use of antithrombotics/antiplatelets: Secondary | ICD-10-CM

## 2020-02-11 DIAGNOSIS — J449 Chronic obstructive pulmonary disease, unspecified: Secondary | ICD-10-CM | POA: Diagnosis not present

## 2020-02-11 DIAGNOSIS — N183 Chronic kidney disease, stage 3 unspecified: Secondary | ICD-10-CM | POA: Diagnosis present

## 2020-02-11 DIAGNOSIS — F339 Major depressive disorder, recurrent, unspecified: Secondary | ICD-10-CM | POA: Diagnosis not present

## 2020-02-11 DIAGNOSIS — M25551 Pain in right hip: Secondary | ICD-10-CM | POA: Diagnosis not present

## 2020-02-11 DIAGNOSIS — Z853 Personal history of malignant neoplasm of breast: Secondary | ICD-10-CM

## 2020-02-11 DIAGNOSIS — M6281 Muscle weakness (generalized): Secondary | ICD-10-CM | POA: Diagnosis not present

## 2020-02-11 DIAGNOSIS — M2550 Pain in unspecified joint: Secondary | ICD-10-CM | POA: Diagnosis not present

## 2020-02-11 DIAGNOSIS — Z96641 Presence of right artificial hip joint: Secondary | ICD-10-CM | POA: Diagnosis not present

## 2020-02-11 DIAGNOSIS — M8000XA Age-related osteoporosis with current pathological fracture, unspecified site, initial encounter for fracture: Secondary | ICD-10-CM | POA: Diagnosis not present

## 2020-02-11 DIAGNOSIS — I160 Hypertensive urgency: Secondary | ICD-10-CM | POA: Diagnosis not present

## 2020-02-11 DIAGNOSIS — Z7901 Long term (current) use of anticoagulants: Secondary | ICD-10-CM | POA: Diagnosis not present

## 2020-02-11 DIAGNOSIS — S72001A Fracture of unspecified part of neck of right femur, initial encounter for closed fracture: Secondary | ICD-10-CM

## 2020-02-11 DIAGNOSIS — R918 Other nonspecific abnormal finding of lung field: Secondary | ICD-10-CM

## 2020-02-11 DIAGNOSIS — R54 Age-related physical debility: Secondary | ICD-10-CM | POA: Diagnosis present

## 2020-02-11 DIAGNOSIS — D62 Acute posthemorrhagic anemia: Secondary | ICD-10-CM | POA: Diagnosis not present

## 2020-02-11 DIAGNOSIS — Z888 Allergy status to other drugs, medicaments and biological substances status: Secondary | ICD-10-CM

## 2020-02-11 DIAGNOSIS — Z20822 Contact with and (suspected) exposure to covid-19: Secondary | ICD-10-CM | POA: Diagnosis not present

## 2020-02-11 DIAGNOSIS — M1990 Unspecified osteoarthritis, unspecified site: Secondary | ICD-10-CM | POA: Diagnosis not present

## 2020-02-11 DIAGNOSIS — Z79899 Other long term (current) drug therapy: Secondary | ICD-10-CM

## 2020-02-11 DIAGNOSIS — R52 Pain, unspecified: Secondary | ICD-10-CM | POA: Diagnosis not present

## 2020-02-11 DIAGNOSIS — R2681 Unsteadiness on feet: Secondary | ICD-10-CM | POA: Diagnosis not present

## 2020-02-11 DIAGNOSIS — I129 Hypertensive chronic kidney disease with stage 1 through stage 4 chronic kidney disease, or unspecified chronic kidney disease: Secondary | ICD-10-CM | POA: Diagnosis not present

## 2020-02-11 DIAGNOSIS — C50412 Malignant neoplasm of upper-outer quadrant of left female breast: Secondary | ICD-10-CM | POA: Diagnosis not present

## 2020-02-11 DIAGNOSIS — R278 Other lack of coordination: Secondary | ICD-10-CM | POA: Diagnosis not present

## 2020-02-11 DIAGNOSIS — Z720 Tobacco use: Secondary | ICD-10-CM | POA: Diagnosis not present

## 2020-02-11 DIAGNOSIS — F29 Unspecified psychosis not due to a substance or known physiological condition: Secondary | ICD-10-CM | POA: Diagnosis not present

## 2020-02-11 DIAGNOSIS — F17210 Nicotine dependence, cigarettes, uncomplicated: Secondary | ICD-10-CM | POA: Diagnosis present

## 2020-02-11 DIAGNOSIS — Y92017 Garden or yard in single-family (private) house as the place of occurrence of the external cause: Secondary | ICD-10-CM | POA: Diagnosis not present

## 2020-02-11 DIAGNOSIS — Z7401 Bed confinement status: Secondary | ICD-10-CM | POA: Diagnosis not present

## 2020-02-11 DIAGNOSIS — Z681 Body mass index (BMI) 19 or less, adult: Secondary | ICD-10-CM | POA: Diagnosis not present

## 2020-02-11 DIAGNOSIS — S0990XA Unspecified injury of head, initial encounter: Secondary | ICD-10-CM | POA: Diagnosis not present

## 2020-02-11 DIAGNOSIS — I4821 Permanent atrial fibrillation: Secondary | ICD-10-CM | POA: Diagnosis not present

## 2020-02-11 DIAGNOSIS — F419 Anxiety disorder, unspecified: Secondary | ICD-10-CM | POA: Diagnosis not present

## 2020-02-11 DIAGNOSIS — R55 Syncope and collapse: Secondary | ICD-10-CM

## 2020-02-11 DIAGNOSIS — S72009A Fracture of unspecified part of neck of unspecified femur, initial encounter for closed fracture: Secondary | ICD-10-CM | POA: Diagnosis present

## 2020-02-11 DIAGNOSIS — W19XXXD Unspecified fall, subsequent encounter: Secondary | ICD-10-CM | POA: Diagnosis not present

## 2020-02-11 DIAGNOSIS — Y998 Other external cause status: Secondary | ICD-10-CM

## 2020-02-11 DIAGNOSIS — R64 Cachexia: Secondary | ICD-10-CM | POA: Diagnosis present

## 2020-02-11 DIAGNOSIS — S7291XD Unspecified fracture of right femur, subsequent encounter for closed fracture with routine healing: Secondary | ICD-10-CM | POA: Diagnosis not present

## 2020-02-11 DIAGNOSIS — J440 Chronic obstructive pulmonary disease with acute lower respiratory infection: Secondary | ICD-10-CM | POA: Diagnosis present

## 2020-02-11 DIAGNOSIS — K5730 Diverticulosis of large intestine without perforation or abscess without bleeding: Secondary | ICD-10-CM | POA: Diagnosis not present

## 2020-02-11 DIAGNOSIS — F329 Major depressive disorder, single episode, unspecified: Secondary | ICD-10-CM | POA: Diagnosis not present

## 2020-02-11 DIAGNOSIS — C7800 Secondary malignant neoplasm of unspecified lung: Secondary | ICD-10-CM | POA: Diagnosis not present

## 2020-02-11 DIAGNOSIS — M810 Age-related osteoporosis without current pathological fracture: Secondary | ICD-10-CM | POA: Diagnosis present

## 2020-02-11 DIAGNOSIS — I4891 Unspecified atrial fibrillation: Secondary | ICD-10-CM | POA: Diagnosis not present

## 2020-02-11 DIAGNOSIS — R5381 Other malaise: Secondary | ICD-10-CM | POA: Diagnosis not present

## 2020-02-11 DIAGNOSIS — J438 Other emphysema: Secondary | ICD-10-CM

## 2020-02-11 DIAGNOSIS — J90 Pleural effusion, not elsewhere classified: Secondary | ICD-10-CM | POA: Diagnosis not present

## 2020-02-11 DIAGNOSIS — F4320 Adjustment disorder, unspecified: Secondary | ICD-10-CM

## 2020-02-11 DIAGNOSIS — H9190 Unspecified hearing loss, unspecified ear: Secondary | ICD-10-CM | POA: Diagnosis not present

## 2020-02-11 DIAGNOSIS — I480 Paroxysmal atrial fibrillation: Secondary | ICD-10-CM | POA: Diagnosis present

## 2020-02-11 DIAGNOSIS — I10 Essential (primary) hypertension: Secondary | ICD-10-CM | POA: Diagnosis not present

## 2020-02-11 DIAGNOSIS — F411 Generalized anxiety disorder: Secondary | ICD-10-CM | POA: Diagnosis not present

## 2020-02-11 DIAGNOSIS — S72031A Displaced midcervical fracture of right femur, initial encounter for closed fracture: Principal | ICD-10-CM | POA: Diagnosis present

## 2020-02-11 DIAGNOSIS — S72041A Displaced fracture of base of neck of right femur, initial encounter for closed fracture: Secondary | ICD-10-CM | POA: Diagnosis not present

## 2020-02-11 DIAGNOSIS — Z8249 Family history of ischemic heart disease and other diseases of the circulatory system: Secondary | ICD-10-CM

## 2020-02-11 DIAGNOSIS — Z973 Presence of spectacles and contact lenses: Secondary | ICD-10-CM

## 2020-02-11 DIAGNOSIS — I951 Orthostatic hypotension: Secondary | ICD-10-CM | POA: Diagnosis not present

## 2020-02-11 DIAGNOSIS — Z471 Aftercare following joint replacement surgery: Secondary | ICD-10-CM | POA: Diagnosis not present

## 2020-02-11 DIAGNOSIS — W174XXA Fall from dock, initial encounter: Secondary | ICD-10-CM

## 2020-02-11 DIAGNOSIS — S199XXA Unspecified injury of neck, initial encounter: Secondary | ICD-10-CM | POA: Diagnosis not present

## 2020-02-11 DIAGNOSIS — Z419 Encounter for procedure for purposes other than remedying health state, unspecified: Secondary | ICD-10-CM

## 2020-02-11 DIAGNOSIS — W19XXXA Unspecified fall, initial encounter: Secondary | ICD-10-CM | POA: Diagnosis not present

## 2020-02-11 DIAGNOSIS — Z8349 Family history of other endocrine, nutritional and metabolic diseases: Secondary | ICD-10-CM

## 2020-02-11 DIAGNOSIS — M25572 Pain in left ankle and joints of left foot: Secondary | ICD-10-CM | POA: Diagnosis not present

## 2020-02-11 DIAGNOSIS — R262 Difficulty in walking, not elsewhere classified: Secondary | ICD-10-CM | POA: Diagnosis not present

## 2020-02-11 DIAGNOSIS — Z972 Presence of dental prosthetic device (complete) (partial): Secondary | ICD-10-CM

## 2020-02-11 DIAGNOSIS — S72002D Fracture of unspecified part of neck of left femur, subsequent encounter for closed fracture with routine healing: Secondary | ICD-10-CM | POA: Diagnosis not present

## 2020-02-11 DIAGNOSIS — J189 Pneumonia, unspecified organism: Secondary | ICD-10-CM | POA: Diagnosis not present

## 2020-02-11 DIAGNOSIS — E441 Mild protein-calorie malnutrition: Secondary | ICD-10-CM | POA: Diagnosis not present

## 2020-02-11 LAB — CBC WITH DIFFERENTIAL/PLATELET
Abs Immature Granulocytes: 0.04 10*3/uL (ref 0.00–0.07)
Basophils Absolute: 0 10*3/uL (ref 0.0–0.1)
Basophils Relative: 0 %
Eosinophils Absolute: 0 10*3/uL (ref 0.0–0.5)
Eosinophils Relative: 0 %
HCT: 34.2 % — ABNORMAL LOW (ref 36.0–46.0)
Hemoglobin: 10.8 g/dL — ABNORMAL LOW (ref 12.0–15.0)
Immature Granulocytes: 1 %
Lymphocytes Relative: 10 %
Lymphs Abs: 0.9 10*3/uL (ref 0.7–4.0)
MCH: 26.4 pg (ref 26.0–34.0)
MCHC: 31.6 g/dL (ref 30.0–36.0)
MCV: 83.6 fL (ref 80.0–100.0)
Monocytes Absolute: 0.7 10*3/uL (ref 0.1–1.0)
Monocytes Relative: 8 %
Neutro Abs: 7.2 10*3/uL (ref 1.7–7.7)
Neutrophils Relative %: 81 %
Platelets: 391 10*3/uL (ref 150–400)
RBC: 4.09 MIL/uL (ref 3.87–5.11)
RDW: 15.1 % (ref 11.5–15.5)
WBC: 8.8 10*3/uL (ref 4.0–10.5)
nRBC: 0 % (ref 0.0–0.2)

## 2020-02-11 LAB — BASIC METABOLIC PANEL
Anion gap: 8 (ref 5–15)
BUN: 16 mg/dL (ref 8–23)
CO2: 21 mmol/L — ABNORMAL LOW (ref 22–32)
Calcium: 8.3 mg/dL — ABNORMAL LOW (ref 8.9–10.3)
Chloride: 108 mmol/L (ref 98–111)
Creatinine, Ser: 1.08 mg/dL — ABNORMAL HIGH (ref 0.44–1.00)
GFR calc Af Amer: 56 mL/min — ABNORMAL LOW (ref >60)
GFR calc non Af Amer: 48 mL/min — ABNORMAL LOW (ref >60)
Glucose, Bld: 118 mg/dL — ABNORMAL HIGH (ref 70–99)
Potassium: 5.1 mmol/L (ref 3.5–5.1)
Sodium: 137 mmol/L (ref 135–145)

## 2020-02-11 LAB — ABO/RH: ABO/RH(D): A POS

## 2020-02-11 LAB — TYPE AND SCREEN
ABO/RH(D): A POS
Antibody Screen: NEGATIVE

## 2020-02-11 LAB — SARS CORONAVIRUS 2 BY RT PCR (HOSPITAL ORDER, PERFORMED IN ~~LOC~~ HOSPITAL LAB): SARS Coronavirus 2: NEGATIVE

## 2020-02-11 LAB — PROTIME-INR
INR: 1.3 — ABNORMAL HIGH (ref 0.8–1.2)
Prothrombin Time: 15.5 seconds — ABNORMAL HIGH (ref 11.4–15.2)

## 2020-02-11 MED ORDER — HYDROCODONE-ACETAMINOPHEN 5-325 MG PO TABS: 1.0000 | ORAL_TABLET | Freq: Four times a day (QID) | ORAL | Status: DC | PRN

## 2020-02-11 MED ORDER — MIRTAZAPINE 15 MG PO TABS
15.0000 mg | ORAL_TABLET | Freq: Every day | ORAL | Status: DC
  Administered 2020-02-11 – 2020-02-22 (×12): 15 mg via ORAL
  Filled 2020-02-11 (×13): qty 1

## 2020-02-11 MED ORDER — MIDODRINE HCL 5 MG PO TABS: 5.0000 mg | ORAL_TABLET | Freq: Four times a day (QID) | ORAL | Status: DC | PRN

## 2020-02-11 MED ORDER — LOSARTAN POTASSIUM 25 MG PO TABS
12.5000 mg | ORAL_TABLET | Freq: Every day | ORAL | Status: DC
  Administered 2020-02-11 – 2020-02-15 (×5): 12.5 mg via ORAL
  Filled 2020-02-11 (×6): qty 0.5

## 2020-02-11 MED ORDER — SENNA 8.6 MG PO TABS
1.0000 | ORAL_TABLET | Freq: Two times a day (BID) | ORAL | Status: DC
  Administered 2020-02-12 – 2020-02-22 (×18): 8.6 mg via ORAL
  Filled 2020-02-11 (×22): qty 1

## 2020-02-11 MED ORDER — ONDANSETRON HCL 4 MG/2ML IJ SOLN
4.0000 mg | Freq: Three times a day (TID) | INTRAMUSCULAR | Status: DC | PRN
  Administered 2020-02-11: 4 mg via INTRAVENOUS
  Filled 2020-02-11 (×2): qty 2

## 2020-02-11 MED ORDER — MORPHINE SULFATE (PF) 2 MG/ML IV SOLN: 0.5000 mg | INTRAVENOUS | Status: DC | PRN

## 2020-02-11 MED ORDER — IOHEXOL 300 MG/ML  SOLN
80.0000 mL | Freq: Once | INTRAMUSCULAR | Status: AC | PRN
  Administered 2020-02-11: 80 mL via INTRAVENOUS

## 2020-02-11 MED ORDER — METOPROLOL TARTRATE 5 MG/5ML IV SOLN
5.0000 mg | Freq: Once | INTRAVENOUS | Status: AC
  Administered 2020-02-11: 5 mg via INTRAVENOUS
  Filled 2020-02-11: qty 5

## 2020-02-11 MED ORDER — LORATADINE 10 MG PO TABS
10.0000 mg | ORAL_TABLET | Freq: Every day | ORAL | Status: DC
  Administered 2020-02-12 – 2020-02-13 (×2): 10 mg via ORAL
  Filled 2020-02-11 (×2): qty 1

## 2020-02-11 MED ORDER — MOMETASONE FURO-FORMOTEROL FUM 100-5 MCG/ACT IN AERO
2.0000 | INHALATION_SPRAY | Freq: Two times a day (BID) | RESPIRATORY_TRACT | Status: DC
  Administered 2020-02-11 – 2020-02-23 (×23): 2 via RESPIRATORY_TRACT
  Filled 2020-02-11: qty 8.8

## 2020-02-11 MED ORDER — BUSPIRONE HCL 5 MG PO TABS
15.0000 mg | ORAL_TABLET | Freq: Two times a day (BID) | ORAL | Status: DC
  Administered 2020-02-11 – 2020-02-23 (×24): 15 mg via ORAL
  Filled 2020-02-11 (×25): qty 1

## 2020-02-11 MED ORDER — SODIUM CHLORIDE 0.9 % IV SOLN: INTRAVENOUS | Status: DC

## 2020-02-11 MED ORDER — MORPHINE SULFATE (PF) 2 MG/ML IV SOLN
2.0000 mg | INTRAVENOUS | Status: DC | PRN
  Administered 2020-02-11: 2 mg via INTRAVENOUS
  Filled 2020-02-11: qty 1

## 2020-02-11 NOTE — Plan of Care (Signed)

## 2020-02-11 NOTE — ED Notes (Signed)
Report given to Erika RN

## 2020-02-11 NOTE — Consult Note (Signed)
Reason for Consult:Right hip fracture Referring Physician: Dr. Spero Curb Bianca [name] is an 79 y.o. female.  HPI: 79 year old female status post fall last night with right hip pain and inability to ambulate.  Presented to the emergency department is found to have a right hip fracture.  I was called for evaluation and management.  She endorses pain in the right hip but denies other injuries with the fall.  Past Medical History:  Diagnosis Date  . Anxiety   . Arthritis   . Asthma   . COPD (chronic obstructive pulmonary disease) ([place])   . HOH (hard of hearing)   . Hypertension   . Wears dentures    top and partial bottom  . Wears glasses     Past Surgical History:  Procedure Laterality Date  . APPENDECTOMY    . BREAST LUMPECTOMY WITH NEEDLE LOCALIZATION Right 04/21/2013   Procedure: BREAST LUMPECTOMY WITH NEEDLE LOCALIZATION;  Surgeon: Harl Bowie, MD;  Location: [facility];  Service: General;  Laterality: Right;  . BREAST SURGERY  1985   cyst rt br  . MASTECTOMY W/ SENTINEL NODE BIOPSY Left 04/21/2013   Procedure: MASTECTOMY WITH SENTINEL LYMPH NODE BIOPSY;  Surgeon: Harl Bowie, MD;  Location: [facility];  Service: General;  Laterality: Left;  . OTHER SURGICAL HISTORY     cysts removed from foot  . OTHER SURGICAL HISTORY     cysts removed from breast  . TUBAL LIGATION      No family history on file.  Social History:  reports that she has been smoking cigarettes. She has been smoking about 0.25 packs per day. She has never used smokeless tobacco. She reports that she does not drink alcohol and does not use drugs.  Allergies: No Known Allergies  Medications: I have reviewed the patient's current medications.  Results for orders placed or performed during the hospital encounter of 02/11/20 (from the past 48 hour(s))  Basic metabolic panel     Status: Abnormal   Collection Time: 02/11/20 12:10 PM  Result Value Ref Range   Sodium 137  135 - 145 mmol/L   Potassium 5.1 3.5 - 5.1 mmol/L    Comment: HEMOLYSIS AT THIS LEVEL MAY AFFECT RESULT   Chloride 108 98 - 111 mmol/L   CO2 21 (L) 22 - 32 mmol/L   Glucose, Bld 118 (H) 70 - 99 mg/dL    Comment: Glucose reference range applies only to samples taken after fasting for at least 8 hours.   BUN 16 8 - 23 mg/dL   Creatinine, Ser 1.08 (H) 0.44 - 1.00 mg/dL   Calcium 8.3 (L) 8.9 - 10.3 mg/dL   GFR calc non Af Amer 48 (L) >60 mL/min   GFR calc Af Amer 56 (L) >60 mL/min   Anion gap 8 5 - 15    Comment: Performed at [facility] [address] [place], [place] [address]  CBC WITH DIFFERENTIAL     Status: Abnormal   Collection Time: 02/11/20 12:10 PM  Result Value Ref Range   WBC 8.8 4.0 - 10.5 K/uL   RBC 4.09 3.87 - 5.11 MIL/uL   Hemoglobin 10.8 (L) 12.0 - 15.0 g/dL   HCT 34.2 (L) 36 - 46 %   MCV 83.6 80.0 - 100.0 fL   MCH 26.4 26.0 - 34.0 pg   MCHC 31.6 30.0 - 36.0 g/dL   RDW 15.1 11.5 - 15.5 %   Platelets 391 150 - 400 K/uL  nRBC 0.0 0.0 - 0.2 %   Neutrophils Relative % 81 %   Neutro Abs 7.2 1.7 - 7.7 K/uL   Lymphocytes Relative 10 %   Lymphs Abs 0.9 0.7 - 4.0 K/uL   Monocytes Relative 8 %   Monocytes Absolute 0.7 0 - 1 K/uL   Eosinophils Relative 0 %   Eosinophils Absolute 0.0 0 - 0 K/uL   Basophils Relative 0 %   Basophils Absolute 0.0 0 - 0 K/uL   Immature Granulocytes 1 %   Abs Immature Granulocytes 0.04 0.00 - 0.07 K/uL    Comment: Performed at [facility] [address] [place], [place] [address]  Protime-INR     Status: Abnormal   Collection Time: 02/11/20 12:10 PM  Result Value Ref Range   Prothrombin Time 15.5 (H) 11.4 - 15.2 seconds   INR 1.3 (H) 0.8 - 1.2    Comment: (NOTE) INR goal varies based on device and disease states. Performed at [facility], [place] [address], [place], [place] [address]   Type and screen [place]     Status: None   Collection Time: 02/11/20 12:16 PM  Result Value Ref Range    ABO/RH(D) A POS    Antibody Screen NEG    Sample Expiration      02/14/2020,2359 Performed at [facility], [place] [address], [place], [place] [address]   SARS Coronavirus 2 by RT PCR (hospital order, performed in [facility] hospital lab) Nasopharyngeal Nasopharyngeal Swab     Status: None   Collection Time: 02/11/20  2:19 PM   Specimen: Nasopharyngeal Swab  Result Value Ref Range   SARS Coronavirus 2 NEGATIVE NEGATIVE    Comment: (NOTE) SARS-CoV-2 target nucleic acids are NOT DETECTED.  The SARS-CoV-2 RNA is generally detectable in upper and lower respiratory specimens during the acute phase of infection. The lowest concentration of SARS-CoV-2 viral copies this assay can detect is 250 copies / mL. A negative result does not preclude SARS-CoV-2 infection and should not be used as the sole basis for treatment or other patient management decisions.  A negative result may occur with improper specimen collection / handling, submission of specimen other than nasopharyngeal swab, presence of viral mutation(s) within the areas targeted by this assay, and inadequate number of viral copies (<250 copies / mL). A negative result must be combined with clinical observations, patient history, and epidemiological information.  Fact Sheet for Patients:   StrictlyIdeas.no  Fact Sheet for Healthcare Providers: BankingDealers.co.za  This test is not yet approved or  cleared by the [place] FDA and has been authorized for detection and/or diagnosis of SARS-CoV-2 by FDA under an Emergency Use Authorization (EUA).  This EUA will remain in effect (meaning this test can be used) for the duration of the COVID-19 declaration under Section 564(b)(1) of the Act, 21 U.S.C. section 360bbb-3(b)(1), unless the authorization is terminated or revoked sooner.  Performed at [facility], [place] [address], [place], [place] [address]   ABO/Rh      Status: None   Collection Time: 02/11/20  4:36 PM  Result Value Ref Range   ABO/RH(D)      A POS Performed at [facility] [address] [place], [place] [address]     DG Chest 1 View  Result Date: 02/11/2020 CLINICAL DATA:  Fall.  Concern for possible hip fracture. EXAM: CHEST  1 VIEW COMPARISON:  01/30/2020 FINDINGS: Normal heart size. There is  blunting of the left costophrenic angle concerning for small effusion. Left lower lobe lung mass is again identified measuring 4.7 cm. The lungs are otherwise clear. No acute airspace consolidation IMPRESSION: 1. Small left pleural effusion. 2. Left lower lobe lung mass. Advise further evaluation with contrast enhanced CT of the chest. Electronically Signed   By: Kerby Moors M.D.   On: 02/11/2020 11:53   CT CHEST W CONTRAST  Result Date: 02/11/2020 CLINICAL DATA:  79 year old female with history of unintended weight loss. EXAM: CT CHEST, ABDOMEN, AND PELVIS WITH CONTRAST TECHNIQUE: Multidetector CT imaging of the chest, abdomen and pelvis was performed following the standard protocol during bolus administration of intravenous contrast. CONTRAST:  24mL OMNIPAQUE IOHEXOL 300 MG/ML  SOLN COMPARISON:  No priors. FINDINGS: CT CHEST FINDINGS Cardiovascular: Heart size is normal. There is no significant pericardial fluid, thickening or pericardial calcification. There is aortic atherosclerosis, as well as atherosclerosis of the great vessels of the mediastinum and the coronary arteries, including calcified atherosclerotic plaque in the left main, left anterior descending, left circumflex and right coronary arteries. Mediastinum/Nodes: No pathologically enlarged mediastinal or hilar lymph nodes. Esophagus is unremarkable in appearance. No axillary lymphadenopathy. Lungs/Pleura: In the left lower lobe (axial image 40 of series 3 and coronal image 69 of series 6) there is a thick-walled cavitary mass measuring approximately 4.0 x 3.5 x 6.2 cm. This mass has  macrolobulated and slightly spiculated margins. Internal air-fluid level. Trace left pleural effusion lying dependently. Additional mass-like area of architectural distortion in the base of the right lower lobe (axial image 129 of series 5) measuring 3.9 x 1.4 cm, favored to reflect atelectasis given adjacent areas of mucoid impaction and linear architectural distortion. Musculoskeletal: Chronic appearing compression fracture of T11 with 60% loss of anterior vertebral body height. There are no aggressive appearing lytic or blastic lesions noted in the visualized portions of the skeleton. CT ABDOMEN PELVIS FINDINGS Hepatobiliary: No suspicious cystic or solid hepatic lesions. No intra or extrahepatic biliary ductal dilatation. Gallbladder is normal in appearance. Pancreas: No pancreatic mass. No pancreatic ductal dilatation. No pancreatic or peripancreatic fluid collections or inflammatory changes. Spleen: Unremarkable. Adrenals/Urinary Tract: Severe atrophy of the left kidney. Right kidney and bilateral adrenal glands are normal in appearance. No hydroureteronephrosis. Urinary bladder is moderately distended, but otherwise unremarkable in appearance. Stomach/Bowel: Normal appearance of the stomach. No pathologic dilatation of small bowel or colon. Numerous colonic diverticulae are noted, particularly in the sigmoid colon, without surrounding inflammatory changes to suggest an acute diverticulitis at this time. The appendix is not confidently identified and may be surgically absent. Regardless, there are no inflammatory changes noted adjacent to the cecum to suggest the presence of an acute appendicitis at this time. Vascular/Lymphatic: Aortic atherosclerosis, without evidence of aneurysm or dissection in the abdominal or pelvic vasculature. No lymphadenopathy noted in the abdomen or pelvis. Reproductive: Uterus and ovaries are atrophic. Other: No significant volume of ascites.  No pneumoperitoneum. Musculoskeletal:  Right femoral neck fracture which appears foreshortened and slightly rotated. There are no aggressive appearing lytic or blastic lesions noted in the visualized portions of the skeleton. IMPRESSION: 1. 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 Trace left pleural effusion is likely malignant. 2. Additional mass-like area of architectural distortion in the base of the right lower lobe, favored to represent an area of possible rounded atelectasis, although close attention on follow-up studies is recommended  to ensure stability or resolution. 3. No definite signs of metastatic disease to the abdomen or pelvis. 4. Right femoral neck fracture which is mildly rotated and foreshortened. 5. Colonic diverticulosis without evidence of acute diverticulitis at this time. 6. Aortic atherosclerosis, in addition to left main and 3 vessel coronary artery disease. 7. Additional incidental findings, as above. Electronically Signed   By: Vinnie Langton M.D.   On: 02/11/2020 16:28   CT ABDOMEN PELVIS W CONTRAST  Result Date: 02/11/2020 CLINICAL DATA:  79 year old female with history of unintended weight loss. EXAM: CT CHEST, ABDOMEN, AND PELVIS WITH CONTRAST TECHNIQUE: Multidetector CT imaging of the chest, abdomen and pelvis was performed following the standard protocol during bolus administration of intravenous contrast. CONTRAST:  36mL OMNIPAQUE IOHEXOL 300 MG/ML  SOLN COMPARISON:  No priors. FINDINGS: CT CHEST FINDINGS Cardiovascular: Heart size is normal. There is no significant pericardial fluid, thickening or pericardial calcification. There is aortic atherosclerosis, as well as atherosclerosis of the great vessels of the mediastinum and the coronary arteries, including calcified atherosclerotic plaque in the left main, left anterior descending, left circumflex and right coronary arteries. Mediastinum/Nodes:  No pathologically enlarged mediastinal or hilar lymph nodes. Esophagus is unremarkable in appearance. No axillary lymphadenopathy. Lungs/Pleura: In the left lower lobe (axial image 40 of series 3 and coronal image 69 of series 6) there is a thick-walled cavitary mass measuring approximately 4.0 x 3.5 x 6.2 cm. This mass has macrolobulated and slightly spiculated margins. Internal air-fluid level. Trace left pleural effusion lying dependently. Additional mass-like area of architectural distortion in the base of the right lower lobe (axial image 129 of series 5) measuring 3.9 x 1.4 cm, favored to reflect atelectasis given adjacent areas of mucoid impaction and linear architectural distortion. Musculoskeletal: Chronic appearing compression fracture of T11 with 60% loss of anterior vertebral body height. There are no aggressive appearing lytic or blastic lesions noted in the visualized portions of the skeleton. CT ABDOMEN PELVIS FINDINGS Hepatobiliary: No suspicious cystic or solid hepatic lesions. No intra or extrahepatic biliary ductal dilatation. Gallbladder is normal in appearance. Pancreas: No pancreatic mass. No pancreatic ductal dilatation. No pancreatic or peripancreatic fluid collections or inflammatory changes. Spleen: Unremarkable. Adrenals/Urinary Tract: Severe atrophy of the left kidney. Right kidney and bilateral adrenal glands are normal in appearance. No hydroureteronephrosis. Urinary bladder is moderately distended, but otherwise unremarkable in appearance. Stomach/Bowel: Normal appearance of the stomach. No pathologic dilatation of small bowel or colon. Numerous colonic diverticulae are noted, particularly in the sigmoid colon, without surrounding inflammatory changes to suggest an acute diverticulitis at this time. The appendix is not confidently identified and may be surgically absent. Regardless, there are no inflammatory changes noted adjacent to the cecum to suggest the presence of an acute  appendicitis at this time. Vascular/Lymphatic: Aortic atherosclerosis, without evidence of aneurysm or dissection in the abdominal or pelvic vasculature. No lymphadenopathy noted in the abdomen or pelvis. Reproductive: Uterus and ovaries are atrophic. Other: No significant volume of ascites.  No pneumoperitoneum. Musculoskeletal: Right femoral neck fracture which appears foreshortened and slightly rotated. There are no aggressive appearing lytic or blastic lesions noted in the visualized portions of the skeleton. IMPRESSION: 1. 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 Trace left pleural effusion is likely malignant. 2. Additional mass-like area of architectural distortion in the base of the right lower lobe, favored to represent an area of possible rounded atelectasis,  although close attention on follow-up studies is recommended to ensure stability or resolution. 3. No definite signs of metastatic disease to the abdomen or pelvis. 4. Right femoral neck fracture which is mildly rotated and foreshortened. 5. Colonic diverticulosis without evidence of acute diverticulitis at this time. 6. Aortic atherosclerosis, in addition to left main and 3 vessel coronary artery disease. 7. Additional incidental findings, as above. Electronically Signed   By: Vinnie Langton M.D.   On: 02/11/2020 16:28   DG Hip Unilat With Pelvis 2-3 Views Right  Result Date: 02/11/2020 CLINICAL DATA:  Possible hip fracture EXAM: DG HIP (WITH OR WITHOUT PELVIS) 2-3V RIGHT COMPARISON:  None. FINDINGS: Osteopenia. Impacted and foreshortened transcervical or basicervical fracture of the right femoral neck. No other displaced fracture or dislocation of the pelvis. The proximal left femur is intact in single view. IMPRESSION: Impacted and foreshortened transcervical or basicervical fracture of the right femoral neck. No other  displaced fracture or dislocation of the pelvis. The proximal left femur is intact in single view. Electronically Signed   By: Eddie Candle M.D.   On: 02/11/2020 11:55    Review of Systems  Respiratory: Positive for cough and shortness of breath.   All other systems reviewed and are negative.  Blood pressure (!) 168/90, pulse 99, temperature 98.7 F (37.1 C), temperature source Oral, resp. rate 17, height 5\' 5"  (1.651 m), weight 47.6 kg, SpO2 99 %. Physical Exam HENT:     Head: Atraumatic.  Eyes:     Extraocular Movements: Extraocular movements intact.  Cardiovascular:     Pulses: Normal pulses.  Musculoskeletal:     Comments: R hip TTP.  RLE shortened and ER. NVID.  Skin:    General: Skin is warm.  Neurological:     Mental Status: She is alert. Mental status is at baseline.  Psychiatric:        Mood and Affect: Mood normal.     Assessment/Plan: Right displaced femoral neck fracture She would benefit from right hip hemiarthroplasty to promote early ambulation and prevent complications of bedrest. After speaking with the patient and her family she has a history with Dr. Ninfa Linden and is interested in him being involved in her care. I spoke with Dr. Ninfa Linden today would be able to do her surgery on Tuesday. I think given the fact that she has been on Eliquis waiting another day would be beneficial and surgery Tuesday should be fine. Dr. Ninfa Linden will take over the care of this patient from an orthopedic standpoint. Her eliquis should be held.  She could be short acting anticoagulant as a bridge.  Isabella Stalling 02/11/2020, 5:13 PM

## 2020-02-11 NOTE — Progress Notes (Addendum)
When patient was brought up from ED, 1 daughter came with her. Shortly after, I went back in to check on patient and there were 3 family members now in with patient, 2 daughters and 1 grandaughter that works in [place].  There was also an MD in room discussing CT result and I allowed all 3 to stay in room.  I was aware that a mass had been discovered in patient's lung and discussed with family that they could only have 2 family members during visit per visitor policy after this.  I left family alone for short period and went back to room, discussed patient password and then went back over to visitor policy.  They were unable to pick 2 of them to be visitors, [name] stated her daughter needed to see her grandma as much as they needed to see their mom and as an employee she should be able to see her.  I offered to get them the hospital policy, they told me they didn't want it and [name] stated that she was an 'important person' at a big company and couldn't be here til 3 and her daughter should be able to fil in for her til she gets here. She then asked to speak to the charge nurse because she wanted an exception made for them.  Patient is not confused and was able to participate in the discussion with family about whom should be able to visit.    Caren Griffins, CN notified and went in to discuss further with family.

## 2020-02-11 NOTE — H&P (Addendum)
History and Physical    Bianca [name]:[id number] DOB: [birth year] DOA: 02/11/2020  Referring MD/NP/PA: Alecia Lemming, PA-C PCP: Trinna Post, PA-C  Patient coming from: via EMS  Chief Complaint: Fall  I have personally briefly reviewed patient's old medical records in [place]   HPI: Bianca [name] is a 79 y.o. female with medical history significant of hypertension, paroxysmal atrial fibrillation on Eliquis orthostatic hypotension, COPD, anxiety, history of breast cancer status postmastectomy, osteoporosis, and tobacco abuse who presents after having a fall.  Patient reports that she was on the deck and the next thing she remembered was waking up on the ground.  After talks with her daughter over the phone the event was partly witnessed and they believe that she tripped off the deck which is not that raised off the ground.  Patient did hit her head on a blue plastic chair and then landing on the ground.  There was no reports of any loss of consciousness.  She complained of having severe pain of the right leg and was unable to ambulate.  Patient notes that she has had a persistent cough for the last 3 to 4 weeks and has had weight loss of at least 10 pounds over the last several months.  Due to her persistent cough she had recently had a chest x-ray which showed a left lung mass.  Her primary care provider had scheduled her for outpatient CT scan in the future.  She admits that she has smoked since the age of 60 on average of 1/4 ppd on average.  ED Course: Upon admission into the emergency department patient was noted to be hypertensive at 192/97, and all other vital signs maintained.  Labs significant for hemoglobin 10.8 which appears unchanged, and creatinine 1.08 (previously 1.32 on 4/12).  Chest x-ray noted a small left pleural effusion with a left lower lobe lung mass.  Pelvic x-ray revealed a right femoral neck fracture.   Review of Systems  Constitutional: Positive for  malaise/fatigue and weight loss. Negative for fever.  HENT: Positive for hearing loss.   Eyes: Negative for photophobia and pain.  Respiratory: Positive for cough. Negative for shortness of breath.   Cardiovascular: Negative for chest pain and leg swelling.  Gastrointestinal: Negative for abdominal pain, diarrhea, nausea and vomiting.  Genitourinary: Negative for dysuria and frequency.  Musculoskeletal: Positive for falls and joint pain.  Skin: Negative for rash.  Neurological: Negative for focal weakness, loss of consciousness and headaches.  Endo/Heme/Allergies: Bruises/bleeds easily.  Psychiatric/Behavioral: Positive for substance abuse.    Past Medical History:  Diagnosis Date   Anxiety    Arthritis    Asthma    COPD (chronic obstructive pulmonary disease) ([place])    HOH (hard of hearing)    Hypertension    Wears dentures    top and partial bottom   Wears glasses     Past Surgical History:  Procedure Laterality Date   APPENDECTOMY     BREAST LUMPECTOMY WITH NEEDLE LOCALIZATION Right 04/21/2013   Procedure: BREAST LUMPECTOMY WITH NEEDLE LOCALIZATION;  Surgeon: Harl Bowie, MD;  Location: [facility];  Service: General;  Laterality: Right;   BREAST SURGERY  1985   cyst rt br   MASTECTOMY W/ SENTINEL NODE BIOPSY Left 04/21/2013   Procedure: MASTECTOMY WITH SENTINEL LYMPH NODE BIOPSY;  Surgeon: Harl Bowie, MD;  Location: [facility];  Service: General;  Laterality: Left;   OTHER SURGICAL HISTORY  cysts removed from foot   OTHER SURGICAL HISTORY     cysts removed from breast   TUBAL LIGATION       reports that she has been smoking cigarettes. She has been smoking about 0.25 packs per day. She has never used smokeless tobacco. She reports that she does not drink alcohol and does not use drugs.  No Known Allergies  History reviewed. No pertinent family history.  Prior to Admission medications   Medication Sig  Start Date End Date Taking? Authorizing Provider  ADVAIR DISKUS 100-50 MCG/DOSE AEPB INHALE 1 PUFF TWICE A DAY AS DIRECTED **RINSE MOUTH AFTER USE** Patient taking differently: Inhale 1 puff into the lungs in the morning and at bedtime. INHALE 1 PUFF TWICE A DAY AS DIRECTED **RINSE MOUTH AFTER USE** 10/20/19  Yes Pollak, Adriana M, PA-C  apixaban (ELIQUIS) 2.5 MG TABS tablet Take 1 tablet (2.5 mg total) by mouth 2 (two) times daily. 05/04/19  Yes Evans Lance, MD  busPIRone (BUSPAR) 15 MG tablet Take 1 tablet (15 mg total) by mouth 2 (two) times daily. 10/20/19  Yes Trinna Post, PA-C  Calcium-Magnesium (CAL/MAG PO) Take 1 tablet by mouth 3 (three) times a week.   Yes [provider]  Cholecalciferol (VITAMIN D3) 50 MCG (2000 UT) TABS Take 2,000 Units by mouth 3 (three) times a week.    Yes [provider]  fexofenadine (ALLEGRA) 180 MG tablet Take 180 mg by mouth daily.    Yes [provider]  lactose free nutrition (BOOST) LIQD Take 237 mLs by mouth 2 (two) times daily between meals.   Yes [provider]  losartan (COZAAR) 25 MG tablet Take 1/2 tablet as needed for systolic blood pressure over 180. Patient taking differently: Take 12.5 mg by mouth daily as needed (sbp >180). Take 1/2 tablet as needed for systolic blood pressure over 180. 06/26/19  Yes Evans Lance, MD  midodrine (PROAMATINE) 5 MG tablet Take one tablet by mouth as needed for dizziness/lightheadedness.  May take up to 4 tablets in a 24 hour period. Patient taking differently: Take 5 mg by mouth every 6 (six) hours as needed (dizziness/lightheadedness). Take one tablet by mouth as needed for dizziness/lightheadedness.  May take up to 4 tablets in a 24 hour period. 11/08/19  Yes Evans Lance, MD  mirtazapine (REMERON) 15 MG tablet Take 1 tablet (15 mg total) by mouth at bedtime. 10/20/19  Yes Trinna Post, PA-C  Misc Natural Products (TART CHERRY ADVANCED) CAPS Take 1-2 capsules by mouth 3  (three) times a week.   Yes [provider]  Multiple Vitamins-Minerals (CENTRUM SILVER 50+WOMEN) TABS Take 1 tablet by mouth 3 (three) times a week.   Yes [provider]  Omega-3 Fatty Acids (OMEGA 3 PO) Take 1 capsule by mouth daily.    Yes [provider]  PROAIR HFA 108 (90 Base) MCG/ACT inhaler INHALE 1-2 PUFFS INTO THE LUNGS EVERY 4 HOURS AS NEEDED Patient taking differently: Inhale 1-2 puffs into the lungs every 4 (four) hours as needed for wheezing or shortness of breath.  07/26/17  Yes Lucille Passy, MD  vitamin C (ASCORBIC ACID) 500 MG tablet Take 500 mg by mouth 3 (three) times a week.    Yes [provider]  UNABLE TO FIND Rx: [id number] Post Surgical Bras (Quantity: 6)\ Dx: 174.9; Left mastectomy 05/29/13   Coralie Keens, MD    Physical Exam:  Constitutional: Thin elderly female currently no acute distress Vitals:  02/11/20 1033 02/11/20 1037 02/11/20 1041  BP: (!) 165/84    Pulse: 104    Resp: 16    Temp:  98 F (36.7 C)   TempSrc:  Oral   SpO2: 98%    Weight:   47.6 kg  Height:   5\' 5"  (1.651 m)   Eyes: PERRL, lids and conjunctivae normal ENMT: Mucous membranes are dry posterior pharynx clear of any exudate or lesions.hard of hearing Neck: normal, supple, no masses, no thyromegaly Respiratory: Normal respiratory effort with decreased breath sounds noted in the right lower lung field.  No significant wheezes or rhonchi appreciated.  O2 saturation maintained on room air..  Cardiovascular: Regular rate and rhythm, no murmurs / rubs / gallops. No extremity edema. 2+ pedal pulses. No carotid bruits.  Abdomen: no tenderness, no masses palpated. No hepatosplenomegaly. Bowel sounds positive.  Musculoskeletal: no clubbing / cyanosis.  Right leg externally rotated and shortened.  Pain with any movement of the right leg. Skin: Multiple small areas of bruising noted of upper and lower extremities. Neurologic: CN 2-12 grossly intact. Sensation  intact, DTR normal. Strength 5/5 in all 4.  Psychiatric: Normal judgment and insight. Alert and oriented x 3. Normal mood.     Labs on Admission: I have personally reviewed following labs and imaging studies  CBC: Recent Labs  Lab 02/11/20 1210  WBC 8.8  NEUTROABS 7.2  HGB 10.8*  HCT 34.2*  MCV 83.6  PLT 749   Basic Metabolic Panel: Recent Labs  Lab 02/11/20 1210  NA 137  K 5.1  CL 108  CO2 21*  GLUCOSE 118*  BUN 16  CREATININE 1.08*  CALCIUM 8.3*   GFR: Estimated Creatinine Clearance: 31.2 mL/min (A) (by C-G formula based on SCr of 1.08 mg/dL (H)). Liver Function Tests: No results for input(s): AST, ALT, ALKPHOS, BILITOT, PROT, ALBUMIN in the last 168 hours. No results for input(s): LIPASE, AMYLASE in the last 168 hours. No results for input(s): AMMONIA in the last 168 hours. Coagulation Profile: Recent Labs  Lab 02/11/20 1210  INR 1.3*   Cardiac Enzymes: No results for input(s): CKTOTAL, CKMB, CKMBINDEX, TROPONINI in the last 168 hours. BNP (last 3 results) No results for input(s): PROBNP in the last 8760 hours. HbA1C: No results for input(s): HGBA1C in the last 72 hours. CBG: No results for input(s): GLUCAP in the last 168 hours. Lipid Profile: No results for input(s): CHOL, HDL, LDLCALC, TRIG, CHOLHDL, LDLDIRECT in the last 72 hours. Thyroid Function Tests: No results for input(s): TSH, T4TOTAL, FREET4, T3FREE, THYROIDAB in the last 72 hours. Anemia Panel: No results for input(s): VITAMINB12, FOLATE, FERRITIN, TIBC, IRON, RETICCTPCT in the last 72 hours. Urine analysis:    Component Value Date/Time   COLORURINE YELLOW 01/24/2019 0929   APPEARANCEUR CLEAR 01/24/2019 0929   LABSPEC 1.009 01/24/2019 0929   PHURINE 6.0 01/24/2019 0929   GLUCOSEU NEGATIVE 01/24/2019 0929   GLUCOSEU NEGATIVE 09/13/2018 1419   HGBUR NEGATIVE 01/24/2019 0929   BILIRUBINUR negative 01/08/2020 1610   KETONESUR NEGATIVE 01/24/2019 0929   PROTEINUR Positive (A) 01/08/2020  1610   PROTEINUR NEGATIVE 01/24/2019 0929   UROBILINOGEN 0.2 01/08/2020 1610   UROBILINOGEN 0.2 09/13/2018 1419   NITRITE positive 01/08/2020 1610   NITRITE NEGATIVE 01/24/2019 0929   LEUKOCYTESUR Large (3+) (A) 01/08/2020 1610   LEUKOCYTESUR NEGATIVE 01/24/2019 0929   Sepsis Labs: No results found for this or any previous visit (from the past 240 hour(s)).   Radiological Exams on Admission: DG Chest 1 View  Result Date: 02/11/2020 CLINICAL DATA:  Fall.  Concern for possible hip fracture. EXAM: CHEST  1 VIEW COMPARISON:  01/30/2020 FINDINGS: Normal heart size. There is blunting of the left costophrenic angle concerning for small effusion. Left lower lobe lung mass is again identified measuring 4.7 cm. The lungs are otherwise clear. No acute airspace consolidation IMPRESSION: 1. Small left pleural effusion. 2. Left lower lobe lung mass. Advise further evaluation with contrast enhanced CT of the chest. Electronically Signed   By: Kerby Moors M.D.   On: 02/11/2020 11:53   DG Hip Unilat With Pelvis 2-3 Views Right  Result Date: 02/11/2020 CLINICAL DATA:  Possible hip fracture EXAM: DG HIP (WITH OR WITHOUT PELVIS) 2-3V RIGHT COMPARISON:  None. FINDINGS: Osteopenia. Impacted and foreshortened transcervical or basicervical fracture of the right femoral neck. No other displaced fracture or dislocation of the pelvis. The proximal left femur is intact in single view. IMPRESSION: Impacted and foreshortened transcervical or basicervical fracture of the right femoral neck. No other displaced fracture or dislocation of the pelvis. The proximal left femur is intact in single view. Electronically Signed   By: Eddie Candle M.D.   On: 02/11/2020 11:55    EKG: Independently reviewed.  Sinus rhythm at 99 bpm with atrial  Assessment/Plan Right femoral neck fracture secondary to fall: Acute.  Patient probably possibly tripped off daughters deck and fell.  Complained of severe pain in the right leg.  Found to  have a femoral neck fracture.  Orthopedics formally consulted.  Family requested Dr. Ninfa Linden who we will be back in town on Tuesday.  Plan for surgery on Tuesday -Admit to a MedSurg bed -Hip fracture order set utilized -Heart healthy diet -Hydrocodone/morphine as needed for moderate to severe pain  -Orthopedics formally consulted,  will follow for further recommendations  Left lower lung mass: Acute.  Patient had been complaining of a cough over the last 3 to 4 weeks and recent chest x-ray showed a left lower lung mass.  She had been scheduled to have a CT scan of the chest by her PCP in the outpatient setting.  Suspecting likely malignant in nature history of cough, weight loss, and history of tobacco use. -Check CT chest, abdomen, and pelvis with contrast for staging -Normal saline IV fluids at 75 mL/h -Will need to determine best possible way to get a tissue sample and therefore set up patient to have follow-up with oncology  Hypertensive urgency Orthostatic hypotension: Patient blood pressure was initially noted to be elevated up to 192/97 o regimen includes losartan 12.5 mg as needed for systolic blood pressure greater than 180. -Continue losartan will schedule it -Continue midodrine as needed  Paroxysmal atrial fibrillation on chronic anticoagulation: Patient appears to be in sinus rhythm currently not on any rate controlling medications.  She is on Eliquis and has been taking medication as prescribed her last dose yesterday. -Holding Eliquis for possible need of procedure  Osteoporosis: Patient had recently been seen by endocrinology on 12/26/2019.  DEXA scan at [facility] mammography in [place] on 06/20/2018 showed T-scores of right femoral neck -4, right total femur -3.4, left femoral neck -3.5, left total femur -3.5, AP total spine -1.9.   Tobacco abuse: Patient reports that she smoked for 1/4 pack of cigarettes a day on average for the last 64 years.  She had recently cut back and  currently would like to quit and does not need a nicotine patch. -Continue to encourage cessation of tobacco use  History of breast cancer: Patient  status post resection back in 2014.  DVT prophylaxis: SCDs Code Status: Full Family Communication: Discussed plan of care with the patient's daughter Candace over the phone Disposition Plan: To be determined likely we will need rehab Consults called: Orthopedic surgery Admission status: Impaired  Norval Morton MD [facility] Pager [phone]   If 7PM-7AM, please contact night-coverage [email or web address] Password [email or web address]  02/11/2020, 1:52 PM

## 2020-02-11 NOTE — ED Provider Notes (Signed)
[facility] EMERGENCY DEPARTMENT Provider Note   CSN: [id number] Arrival date & time: 02/11/20  1026     History Chief Complaint  Patient presents with  . Fall    Bianca [name] is a 79 y.o. female.  Patient with history of COPD, smoking history, paroxysmal atrial fibrillation on Eliquis, recent diagnosis of left lung nodule, chronic kidney disease--presents to the emergency department for hip pain.  Patient had acute onset of pain last night.  She was with her daughter and she outside on a deck when she suddenly fell.  Patient was in her normal state of health prior to that and did not report any chest pain, lightheadedness, shortness of breath.  She is uncertain exactly what made her fall.  She needed several family members to help her up in the bed.  She was unable to ambulate this morning prompting emergency department visit.  No treatments prior to arrival.  Patient thinks that she might of hit her head but denies any headache, confusion, vomiting, vision changes or neck pain.  Course is constant.  Pain is worse with bearing weight or palpation.  In addition, patient was scheduled for an outpatient chest CT in 2 days.  Patient was found to have a left lower lobe lung mass on x-ray performed a couple of weeks ago.  Patient was having weight loss and cough.  She was treated with a course of antibiotics for pneumonia however her symptoms did not improve prompting x-ray.        Past Medical History:  Diagnosis Date  . Anxiety   . Arthritis   . Asthma   . COPD (chronic obstructive pulmonary disease) ([place])   . HOH (hard of hearing)   . Hypertension   . Wears dentures    top and partial bottom  . Wears glasses     Patient Active Problem List   Diagnosis Date Noted  . Orthostasis 06/19/2019  . Paroxysmal atrial fibrillation ([place]) 03/16/2019  . Mitral regurgitation 03/16/2019  . Elevated troponin   . Delirium   . Atrial fibrillation with RVR ([place])  01/21/2019  . Underweight due to inadequate caloric intake 12/06/2017  . Acute bronchitis with COPD ([place]) 03/29/2017  . Osteoporosis 07/02/2016  . Tobacco abuse 10/26/2013  . Breast cancer of upper-outer quadrant of left female breast ([place]) 03/06/2013  . COPD (chronic obstructive pulmonary disease) ([place]) 06/09/2012  . Adjustment disorder 11/25/2010  . CHRONIC KIDNEY DISEASE STAGE III (MODERATE) 11/13/2009  . Depression 05/04/2007  . Asthma, chronic, unspecified asthma severity, with acute exacerbation 05/03/2007  . HYPERTENSION, BENIGN ESSENTIAL 12/06/2006  . ALLERGIC  RHINITIS 12/06/2006    Past Surgical History:  Procedure Laterality Date  . APPENDECTOMY    . BREAST LUMPECTOMY WITH NEEDLE LOCALIZATION Right 04/21/2013   Procedure: BREAST LUMPECTOMY WITH NEEDLE LOCALIZATION;  Surgeon: Harl Bowie, MD;  Location: [facility];  Service: General;  Laterality: Right;  . BREAST SURGERY  1985   cyst rt br  . MASTECTOMY W/ SENTINEL NODE BIOPSY Left 04/21/2013   Procedure: MASTECTOMY WITH SENTINEL LYMPH NODE BIOPSY;  Surgeon: Harl Bowie, MD;  Location: [facility];  Service: General;  Laterality: Left;  . OTHER SURGICAL HISTORY     cysts removed from foot  . OTHER SURGICAL HISTORY     cysts removed from breast  . TUBAL LIGATION       OB History   No obstetric history on file.     No family  history on file.  Social History   Tobacco Use  . Smoking status: Current Every Day Smoker    Packs/day: 0.25    Types: Cigarettes  . Smokeless tobacco: Never Used  Substance Use Topics  . Alcohol use: No  . Drug use: No    Home Medications Prior to Admission medications   Medication Sig Start Date End Date Taking? Authorizing Provider  ADVAIR DISKUS 100-50 MCG/DOSE AEPB INHALE 1 PUFF TWICE A DAY AS DIRECTED **RINSE MOUTH AFTER USE** Patient taking differently: Inhale 1 puff into the lungs in the morning and at bedtime. INHALE 1 PUFF TWICE A  DAY AS DIRECTED **RINSE MOUTH AFTER USE** 10/20/19  Yes Pollak, Adriana M, PA-C  apixaban (ELIQUIS) 2.5 MG TABS tablet Take 1 tablet (2.5 mg total) by mouth 2 (two) times daily. 05/04/19  Yes Evans Lance, MD  busPIRone (BUSPAR) 15 MG tablet Take 1 tablet (15 mg total) by mouth 2 (two) times daily. 10/20/19  Yes Trinna Post, PA-C  Calcium-Magnesium (CAL/MAG PO) Take 1 tablet by mouth 3 (three) times a week.   Yes [provider]  Cholecalciferol (VITAMIN D3) 50 MCG (2000 UT) TABS Take 2,000 Units by mouth 3 (three) times a week.    Yes [provider]  fexofenadine (ALLEGRA) 180 MG tablet Take 180 mg by mouth daily.    Yes [provider]  lactose free nutrition (BOOST) LIQD Take 237 mLs by mouth 2 (two) times daily between meals.   Yes [provider]  losartan (COZAAR) 25 MG tablet Take 1/2 tablet as needed for systolic blood pressure over 180. Patient taking differently: Take 12.5 mg by mouth daily as needed (sbp >180). Take 1/2 tablet as needed for systolic blood pressure over 180. 06/26/19  Yes Evans Lance, MD  midodrine (PROAMATINE) 5 MG tablet Take one tablet by mouth as needed for dizziness/lightheadedness.  May take up to 4 tablets in a 24 hour period. Patient taking differently: Take 5 mg by mouth every 6 (six) hours as needed (dizziness/lightheadedness). Take one tablet by mouth as needed for dizziness/lightheadedness.  May take up to 4 tablets in a 24 hour period. 11/08/19  Yes Evans Lance, MD  mirtazapine (REMERON) 15 MG tablet Take 1 tablet (15 mg total) by mouth at bedtime. 10/20/19  Yes Trinna Post, PA-C  Misc Natural Products (TART CHERRY ADVANCED) CAPS Take 1-2 capsules by mouth 3 (three) times a week.   Yes [provider]  Multiple Vitamins-Minerals (CENTRUM SILVER 50+WOMEN) TABS Take 1 tablet by mouth 3 (three) times a week.   Yes [provider]  Omega-3 Fatty Acids (OMEGA 3 PO) Take 1 capsule by mouth daily.     Yes [provider]  PROAIR HFA 108 (90 Base) MCG/ACT inhaler INHALE 1-2 PUFFS INTO THE LUNGS EVERY 4 HOURS AS NEEDED Patient taking differently: Inhale 1-2 puffs into the lungs every 4 (four) hours as needed for wheezing or shortness of breath.  07/26/17  Yes Lucille Passy, MD  vitamin C (ASCORBIC ACID) 500 MG tablet Take 500 mg by mouth 3 (three) times a week.    Yes [provider]  UNABLE TO FIND Rx: [id number]- Post Surgical Bras (Quantity: 6)\ Dx: 174.9; Left mastectomy 05/29/13   Coralie Keens, MD    Allergies    Patient has no known allergies.  Review of Systems   Review of Systems  Constitutional: Negative for fever.  HENT: Negative for rhinorrhea and sore throat.   Eyes:  Negative for redness.  Respiratory: Negative for cough.   Cardiovascular: Negative for chest pain.  Gastrointestinal: Negative for abdominal pain, diarrhea, nausea and vomiting.  Genitourinary: Negative for dysuria.  Musculoskeletal: Positive for arthralgias and gait problem. Negative for myalgias.  Skin: Negative for rash.  Neurological: Negative for headaches.    Physical Exam Updated Vital Signs BP (!) 165/84   Pulse 104   Temp 98 F (36.7 C) (Oral)   Resp 16   Ht 5\' 5"  (1.651 m)   Wt 47.6 kg   SpO2 98%   BMI 17.47 kg/m   Physical Exam Vitals and nursing note reviewed.  Constitutional:      Appearance: She is well-developed.  HENT:     Head: Normocephalic and atraumatic.  Eyes:     General:        Right eye: No discharge.        Left eye: No discharge.     Conjunctiva/sclera: Conjunctivae normal.  Cardiovascular:     Rate and Rhythm: Normal rate and regular rhythm.     Heart sounds: Normal heart sounds.  Pulmonary:     Effort: Pulmonary effort is normal.     Breath sounds: Normal breath sounds.  Abdominal:     Palpations: Abdomen is soft.     Tenderness: There is no abdominal tenderness. There is no guarding or rebound.  Musculoskeletal:     Cervical back: Normal  range of motion and neck supple.     Right hip: Tenderness and bony tenderness present. Decreased range of motion.     Right upper leg: No tenderness.     Right knee: No bony tenderness.     Right lower leg: No tenderness.     Right ankle: No tenderness. Normal range of motion.  Skin:    General: Skin is warm and dry.  Neurological:     Mental Status: She is alert.     ED Results / Procedures / Treatments   Labs (all labs ordered are listed, but only abnormal results are displayed) Labs Reviewed  BASIC METABOLIC PANEL - Abnormal; Notable for the following components:      Result Value   CO2 21 (*)    Glucose, Bld 118 (*)    Creatinine, Ser 1.08 (*)    Calcium 8.3 (*)    GFR calc non Af Amer 48 (*)    GFR calc Af Amer 56 (*)    All other components within normal limits  CBC WITH DIFFERENTIAL/PLATELET - Abnormal; Notable for the following components:   Hemoglobin 10.8 (*)    HCT 34.2 (*)    All other components within normal limits  PROTIME-INR - Abnormal; Notable for the following components:   Prothrombin Time 15.5 (*)    INR 1.3 (*)    All other components within normal limits  SARS CORONAVIRUS 2 BY RT PCR (HOSPITAL ORDER, [facility])  TYPE AND SCREEN  ABO/RH    ED ECG REPORT   Date: 02/11/2020  Rate: 99  Rhythm: normal sinus rhythm  QRS Axis: normal  Intervals: normal  ST/T Wave abnormalities: normal  Conduction Disutrbances:none  Narrative Interpretation:   Old EKG Reviewed: unchanged  Radiology DG Chest 1 View  Result Date: 02/11/2020 CLINICAL DATA:  Fall.  Concern for possible hip fracture. EXAM: CHEST  1 VIEW COMPARISON:  01/30/2020 FINDINGS: Normal heart size. There is blunting of the left costophrenic angle concerning for small effusion. Left lower lobe lung mass is again identified measuring  4.7 cm. The lungs are otherwise clear. No acute airspace consolidation IMPRESSION: 1. Small left pleural effusion. 2. Left lower lobe lung  mass. Advise further evaluation with contrast enhanced CT of the chest. Electronically Signed   By: Kerby Moors M.D.   On: 02/11/2020 11:53   DG Hip Unilat With Pelvis 2-3 Views Right  Result Date: 02/11/2020 CLINICAL DATA:  Possible hip fracture EXAM: DG HIP (WITH OR WITHOUT PELVIS) 2-3V RIGHT COMPARISON:  None. FINDINGS: Osteopenia. Impacted and foreshortened transcervical or basicervical fracture of the right femoral neck. No other displaced fracture or dislocation of the pelvis. The proximal left femur is intact in single view. IMPRESSION: Impacted and foreshortened transcervical or basicervical fracture of the right femoral neck. No other displaced fracture or dislocation of the pelvis. The proximal left femur is intact in single view. Electronically Signed   By: Eddie Candle M.D.   On: 02/11/2020 11:55    Procedures Procedures (including critical care time)  Medications Ordered in ED Medications  ondansetron (ZOFRAN) injection 4 mg (4 mg Intravenous Given 02/11/20 1216)  midodrine (PROAMATINE) tablet 5 mg (has no administration in time range)  busPIRone (BUSPAR) tablet 15 mg (has no administration in time range)  mirtazapine (REMERON) tablet 15 mg (has no administration in time range)  loratadine (CLARITIN) tablet 10 mg (has no administration in time range)  mometasone-formoterol (DULERA) 100-5 MCG/ACT inhaler 2 puff (has no administration in time range)  losartan (COZAAR) tablet 12.5 mg (has no administration in time range)  HYDROcodone-acetaminophen (NORCO/VICODIN) 5-325 MG per tablet 1-2 tablet (has no administration in time range)  morphine 2 MG/ML injection 0.5 mg (has no administration in time range)  senna (SENOKOT) tablet 8.6 mg (has no administration in time range)    ED Course  I have reviewed the triage vital signs and the nursing notes.  Pertinent labs & imaging results that were available during my care of the patient were reviewed by me and considered in my medical  decision making (see chart for details).  Patient seen and examined.  Hip fracture likely based on exam.  Work-up initiated. Medications ordered.   Vital signs reviewed and are as follows: BP (!) 165/84   Pulse 104   Temp 98 F (36.7 C) (Oral)   Resp 16   Ht 5\' 5"  (1.651 m)   Wt 47.6 kg   SpO2 98%   BMI 17.47 kg/m   Patient updated on results.  I discussed the case with patient's daughter [name] and her granddaughter by telephone.  Will discuss case with orthopedic surgery.  Patient will require admission to the hospital.  1:54 PM Discussed with Dr. Melburn Popper Hospitalist.  Several calls to on-call orthopedist made -- however callback from surgeon who is not on-call. Dr. Tamera Punt is listed as on-call. Will continue to try to make contact.   2:09 PM Spoke with Dr. Tamera Punt, he is aware of patient.     MDM Rules/Calculators/A&P                          Admit.   Final Clinical Impression(s) / ED Diagnoses Final diagnoses:  Closed fracture of neck of right femur, initial encounter [facility])  Lung mass    Rx / DC Orders ED Discharge Orders    None       Carlisle Cater, PA-C 02/11/20 1432    Sherwood Gambler, MD 02/14/20 [phone]

## 2020-02-11 NOTE — Progress Notes (Addendum)
   02/11/20 2051  Assess: MEWS Score  Pulse Rate 104  Resp 12  SpO2 98 %  O2 Device Room Air  Assess: MEWS Score  MEWS Temp 0  MEWS Systolic 0  MEWS Pulse 1  MEWS RR 1  MEWS LOC 0  MEWS Score 2  MEWS Score Color Yellow  Assess: if the MEWS score is Yellow or Red  Were vital signs taken at a resting state? Yes  Focused Assessment No change from prior assessment  Early Detection of Sepsis Score *See Row Information* Low  MEWS guidelines implemented *See Row Information* No, vital signs rechecked  Treat  MEWS Interventions Other (Comment) (no acute change)  Pain Scale 0-10  Pain Score 0  Notify: Charge Nurse/RN  Name of Charge Nurse/RN Notified Yrneh, RN  Date Charge Nurse/RN Notified 02/11/20  Time Charge Nurse/RN Notified 2115  Document  Patient Outcome Other (Comment) (Pt stable, reassessed VS)  Progress note created (see row info) Yes    No acute change. Pt resting well, respirations retaken shortly after respiratory administered meds. Pt RR @14  and pt has no complaints, no pains. States she feels fine. [place] notified. Full set of vitals retaken, green MEWS. Will continue to monitor.

## 2020-02-11 NOTE — ED Triage Notes (Signed)
Patient arrived via [facility] from daughter home stating patient fell 02/10/2020 and when she tried to get up and walk in the middle of the night she could not. Per EMS they notices right leg shorten and outer rotation. Patient c/o 5/10 pain and was given 186mcg of Fentanyl and now pain 3/10. EMS Vital Signs 170/90, 100, 96% on RA. EMS stated family say patient did not loss consciousness and did not hit head. Hx of left side mastectomy.  Patient alert and oriented x4.

## 2020-02-11 NOTE — ED Notes (Signed)
Patient takes Eliquis

## 2020-02-12 ENCOUNTER — Inpatient Hospital Stay (HOSPITAL_COMMUNITY): Payer: PPO

## 2020-02-12 DIAGNOSIS — S72001A Fracture of unspecified part of neck of right femur, initial encounter for closed fracture: Secondary | ICD-10-CM

## 2020-02-12 LAB — SURGICAL PCR SCREEN
MRSA, PCR: NEGATIVE
Staphylococcus aureus: NEGATIVE

## 2020-02-12 LAB — BASIC METABOLIC PANEL
Anion gap: 9 (ref 5–15)
BUN: 10 mg/dL (ref 8–23)
CO2: 19 mmol/L — ABNORMAL LOW (ref 22–32)
Calcium: 7.1 mg/dL — ABNORMAL LOW (ref 8.9–10.3)
Chloride: 113 mmol/L — ABNORMAL HIGH (ref 98–111)
Creatinine, Ser: 0.94 mg/dL (ref 0.44–1.00)
GFR calc Af Amer: >60 mL/min (ref >60)
GFR calc non Af Amer: 57 mL/min — ABNORMAL LOW (ref >60)
Glucose, Bld: 99 mg/dL (ref 70–99)
Potassium: 3.4 mmol/L — ABNORMAL LOW (ref 3.5–5.1)
Sodium: 141 mmol/L (ref 135–145)

## 2020-02-12 LAB — CBC
HCT: 29.9 % — ABNORMAL LOW (ref 36.0–46.0)
Hemoglobin: 9.1 g/dL — ABNORMAL LOW (ref 12.0–15.0)
MCH: 25.2 pg — ABNORMAL LOW (ref 26.0–34.0)
MCHC: 30.4 g/dL (ref 30.0–36.0)
MCV: 82.8 fL (ref 80.0–100.0)
Platelets: 300 10*3/uL (ref 150–400)
RBC: 3.61 MIL/uL — ABNORMAL LOW (ref 3.87–5.11)
RDW: 15.1 % (ref 11.5–15.5)
WBC: 8.7 10*3/uL (ref 4.0–10.5)
nRBC: 0 % (ref 0.0–0.2)

## 2020-02-12 LAB — URINALYSIS, ROUTINE W REFLEX MICROSCOPIC
Bilirubin Urine: NEGATIVE
Glucose, UA: NEGATIVE mg/dL
Hgb urine dipstick: NEGATIVE
Ketones, ur: NEGATIVE mg/dL
Leukocytes,Ua: NEGATIVE
Nitrite: NEGATIVE
Protein, ur: NEGATIVE mg/dL
Specific Gravity, Urine: 1.023 (ref 1.005–1.030)
pH: 6 (ref 5.0–8.0)

## 2020-02-12 MED ORDER — ALBUTEROL SULFATE (2.5 MG/3ML) 0.083% IN NEBU
3.0000 mL | INHALATION_SOLUTION | RESPIRATORY_TRACT | Status: DC | PRN
  Administered 2020-02-13 – 2020-02-15 (×2): 3 mL via RESPIRATORY_TRACT
  Filled 2020-02-12 (×2): qty 3

## 2020-02-12 MED ORDER — ENSURE ENLIVE PO LIQD
237.0000 mL | Freq: Three times a day (TID) | ORAL | Status: DC
  Administered 2020-02-12 – 2020-02-23 (×26): 237 mL via ORAL

## 2020-02-12 MED ORDER — NICOTINE 7 MG/24HR TD PT24
7.0000 mg | MEDICATED_PATCH | Freq: Every day | TRANSDERMAL | Status: DC
  Administered 2020-02-12 – 2020-02-23 (×11): 7 mg via TRANSDERMAL
  Filled 2020-02-12 (×12): qty 1

## 2020-02-12 MED ORDER — ADULT MULTIVITAMIN W/MINERALS CH
1.0000 | ORAL_TABLET | Freq: Every day | ORAL | Status: DC
  Administered 2020-02-12 – 2020-02-17 (×5): 1 via ORAL
  Filled 2020-02-12 (×4): qty 1

## 2020-02-12 MED ORDER — ACETAMINOPHEN 325 MG PO TABS
650.0000 mg | ORAL_TABLET | Freq: Four times a day (QID) | ORAL | Status: DC | PRN
  Administered 2020-02-12 – 2020-02-13 (×2): 650 mg via ORAL
  Filled 2020-02-12 (×2): qty 2

## 2020-02-12 NOTE — Progress Notes (Signed)
Initial Nutrition Assessment  DOCUMENTATION CODES:   Underweight, Severe malnutrition in context of chronic illness  INTERVENTION:   -Ensure Enlive po TID, each supplement provides 350 kcal and 20 grams of protein -MVI with minerals daily  NUTRITION DIAGNOSIS:   Severe Malnutrition related to chronic illness (COPD) as evidenced by moderate fat depletion, severe fat depletion, moderate muscle depletion, severe muscle depletion.  GOAL:   Patient will meet greater than or equal to 90% of their needs  MONITOR:   PO intake, Supplement acceptance, Labs, Weight trends, Skin, I & O's  REASON FOR ASSESSMENT:   Consult Assessment of nutrition requirement/status  ASSESSMENT:   Bianca [name] is a 78 y.o. female with medical history significant of hypertension, paroxysmal atrial fibrillation on Eliquis orthostatic hypotension, COPD, anxiety, history of breast cancer status postmastectomy, osteoporosis, and tobacco abuse who presents after having a fall.  Pt admitted with rt hip fracture s/p fall.   Reviewed I/O's: +814 ml x 24 hours   Per chart review, pt also with lung mass.   Pt very lethargic at time of visit. She did not respond to voice or touch. No family present to provide additional history.   Observed breakfast tray- consumed only about 50% of blueberry muffin.   Reviewed wt hx; pt has experienced a 5.4% wt loss over the past year. While this is not significant for time frame, it is concerning given pt's malnutrition, advanced age, and poor oral intake.   Per orthopedics notes, plan for rt hip replacement tomorrow (02/13/20).   Medications reviewed and include 0.9% sodium chloride infusion @ 75 ml/hr.   Labs reviewed.   NUTRITION - FOCUSED PHYSICAL EXAM:    Most Recent Value  Orbital Region Moderate depletion  Upper Arm Region Severe depletion  Thoracic and Lumbar Region Severe depletion  Buccal Region Moderate depletion  Temple Region Moderate depletion   Clavicle Bone Region Severe depletion  Clavicle and Acromion Bone Region Severe depletion  Scapular Bone Region Severe depletion  Dorsal Hand Severe depletion  Patellar Region Severe depletion  Anterior Thigh Region Severe depletion  Posterior Calf Region Severe depletion  Edema (RD Assessment) None  Hair Reviewed  Eyes Reviewed  Mouth Reviewed  Skin Reviewed  Nails Reviewed       Diet Order:   Diet Order            Diet NPO time specified  Diet effective midnight           Diet Heart Room service appropriate? Yes; Fluid consistency: Thin  Diet effective now                 EDUCATION NEEDS:   Education needs have been addressed  Skin:  Skin Assessment: Reviewed RN Assessment  Last BM:  02/11/20  Height:   Ht Readings from Last 1 Encounters:  02/11/20 5\' 5"  (1.651 m)    Weight:   Wt Readings from Last 1 Encounters:  02/11/20 47.6 kg    Ideal Body Weight:  56.8 kg  BMI:  Body mass index is 17.47 kg/m.  Estimated Nutritional Needs:   Kcal:  1500-1700  Protein:  70-85 grams  Fluid:  > 1.5 L    Loistine Chance, RD, LDN, [place] Registered Dietitian II Certified Diabetes Care and Education Specialist Please refer to [facility] for RD and/or RD on-call/weekend/after hours pager

## 2020-02-12 NOTE — Progress Notes (Signed)
PROGRESS NOTE  Bianca [name] RCV:[id number] DOB: [birth year] DOA: 02/11/2020 PCP: Trinna Post, PA-C  HPI/Recap of past 24 hours: HPI: Bianca [name] is a 79 y.o. female with medical history significant of hypertension, paroxysmal atrial fibrillation on Eliquis, orthostatic hypotension on Midodrine, COPD, anxiety, history of breast cancer status postmastectomy, osteoporosis, and tobacco abuse who presented to [facility] ED after having a fall.  Reports she was on the deck and the next thing she remembered was waking up on the ground.  After talks with her daughter over the phone the event was partly witnessed and they believe that she tripped off the deck which is not that raised off the ground.  Patient did hit her head on a blue plastic chair and then landed on the ground.  There was no reports of any loss of consciousness.  She complained of having severe pain of the right leg and was unable to ambulate.  Patient notes that she has had a persistent cough for the last 3 to 4 weeks and has had weight loss of at least 10 pounds over the last several months.  Due to her persistent cough she had recently had a chest x-ray which showed a left lung mass.  Her primary care provider had scheduled her for outpatient CT scan in the future.  She admits that she has smoked since the age of 66 on average of 1/4 ppd.  ED Course: Chest x-ray noted a small left pleural effusion with a left lower lobe lung mass.  Pelvic x-ray revealed a right femoral neck fracture.  02/12/20: Seen and examined.  Very somnolent this morning and aggressive when she awakens.  Plan for right hip repair by Dr. Kathrynn Speed tomorrow afternoon.  N.p.o. after midnight.   Assessment/Plan: Principal Problem:   Femoral neck fracture (HCC) Active Problems:   HYPERTENSION, BENIGN ESSENTIAL   COPD (chronic obstructive pulmonary disease) (HCC)   Breast cancer of upper-outer quadrant of left female breast (HCC)   Tobacco abuse   AF  (paroxysmal atrial fibrillation) (HCC)  Right femoral neck fracture secondary to fall:  Found to have a femoral neck fracture.  Orthopedics formally consulted.   Family requested Dr. Ninfa Linden who we will be back in town on Tuesday.  Plan for surgery on Tuesday Pain control and bowel regimen  Left lower lung mass:  Patient had been complaining of a cough over the last 3 to 4 weeks and recent chest x-ray showed a left lower lung mass.  She had been scheduled to have a CT scan of the chest by her PCP in the outpatient setting.   Suspecting likely malignant in the setting of chronic tobacco use, chronic cough, weight loss, and CT evidence of a Left lower lobe mass. Continue to monitor Continue Normal saline IV fluids at 75 mL/h -Will need to determine best possible way to get a tissue sample and therefore set up patient to have follow-up with oncology  Hypertensive urgency with prior hx of chronic hypotention Hold off midodrine Obtain CT head and neck post fall on [place] Follow results  Paroxysmal atrial fibrillation on chronic anticoagulation:  Currently in sinus rhythm Her last dose 02/10/20. -Holding Eliquis for planned ortho surgery tomorrow  Osteoporosis: Patient had recently been seen by endocrinology on 12/26/2019.  DEXA scan at [facility] mammography in [place] on 06/20/2018 showed T-scores of right femoral neck -4, right total femur -3.4, left femoral neck -3.5, left total femur -3.5, AP total spine -1.9.  Follow up with  your endocrinologist outpatient.  Tobacco abuse disorder, ongoing: Patient reports that she smoked for 1/4 pack of cigarettes a day on average for the last 64 years.  She had recently cut back and currently would like to quit and does not need a nicotine patch. -Continue to encourage cessation of tobacco use  History of breast cancer: Patient status post resection back in 2014.  DVT prophylaxis: SCDs Code Status: Full Family Communication:  None at  bedside.  Consults called: Orthopedic surgery   Status is: Inpatient    Dispo:  Patient From: Home  Planned Disposition: Home with Health Care Svc  Expected discharge date: 02/14/20  Medically stable for discharge: No, likely R hip repair by ortho tomorrow.         Objective: Vitals:   02/12/20 0341 02/12/20 0755 02/12/20 0802 02/12/20 1356  BP: (!) 148/123 (!) 169/85  (!) 151/82  Pulse: 93 96  98  Resp: 18 17  17   Temp: 98.3 F (36.8 C) 98.4 F (36.9 C)  98.7 F (37.1 C)  TempSrc: Oral Axillary  Oral  SpO2: 96% 97% 94% 97%  Weight:      Height:        Intake/Output Summary (Last 24 hours) at 02/12/2020 1505 Last data filed at 02/12/2020 0305 Gross per 24 hour  Intake 814.47 ml  Output --  Net 814.47 ml   Filed Weights   02/11/20 1041  Weight: 47.6 kg    Exam:  . General: 79 y.o. year-old female frail-appearing in no acute distress.  Somnolent but arousable to voices. . Cardiovascular: Regular rate and rhythm with no rubs or gallops.  No thyromegaly or JVD noted.   Marland Kitchen Respiratory: Clear to auscultation with no wheezes or rales. Good inspiratory effort. . Abdomen: Soft nontender nondistended with normal bowel sounds.. . Musculoskeletal: No lower extremity edema bilaterally. Marland Kitchen Psychiatry: Mood is appropriate for condition and setting   Data Reviewed: CBC: Recent Labs  Lab 02/11/20 1210 02/12/20 0630  WBC 8.8 8.7  NEUTROABS 7.2  --   HGB 10.8* 9.1*  HCT 34.2* 29.9*  MCV 83.6 82.8  PLT 391 409   Basic Metabolic Panel: Recent Labs  Lab 02/11/20 1210 02/12/20 0630  NA 137 141  K 5.1 3.4*  CL 108 113*  CO2 21* 19*  GLUCOSE 118* 99  BUN 16 10  CREATININE 1.08* 0.94  CALCIUM 8.3* 7.1*   GFR: Estimated Creatinine Clearance: 35.9 mL/min (by C-G formula based on SCr of 0.94 mg/dL). Liver Function Tests: No results for input(s): AST, ALT, ALKPHOS, BILITOT, PROT, ALBUMIN in the last 168 hours. No results for input(s): LIPASE, AMYLASE in the  last 168 hours. No results for input(s): AMMONIA in the last 168 hours. Coagulation Profile: Recent Labs  Lab 02/11/20 1210  INR 1.3*   Cardiac Enzymes: No results for input(s): CKTOTAL, CKMB, CKMBINDEX, TROPONINI in the last 168 hours. BNP (last 3 results) No results for input(s): PROBNP in the last 8760 hours. HbA1C: No results for input(s): HGBA1C in the last 72 hours. CBG: No results for input(s): GLUCAP in the last 168 hours. Lipid Profile: No results for input(s): CHOL, HDL, LDLCALC, TRIG, CHOLHDL, LDLDIRECT in the last 72 hours. Thyroid Function Tests: No results for input(s): TSH, T4TOTAL, FREET4, T3FREE, THYROIDAB in the last 72 hours. Anemia Panel: No results for input(s): VITAMINB12, FOLATE, FERRITIN, TIBC, IRON, RETICCTPCT in the last 72 hours. Urine analysis:    Component Value Date/Time   COLORURINE YELLOW 02/12/2020 [place]  02/12/2020 1430   LABSPEC 1.023 02/12/2020 1430   PHURINE 6.0 02/12/2020 1430   GLUCOSEU NEGATIVE 02/12/2020 1430   GLUCOSEU NEGATIVE 09/13/2018 1419   HGBUR NEGATIVE 02/12/2020 1430   BILIRUBINUR NEGATIVE 02/12/2020 1430   BILIRUBINUR negative 01/08/2020 1610   KETONESUR NEGATIVE 02/12/2020 1430   PROTEINUR NEGATIVE 02/12/2020 1430   UROBILINOGEN 0.2 01/08/2020 1610   UROBILINOGEN 0.2 09/13/2018 1419   NITRITE NEGATIVE 02/12/2020 1430   LEUKOCYTESUR NEGATIVE 02/12/2020 1430   Sepsis Labs: @LABRCNTIP (procalcitonin:4,lacticidven:4)  ) Recent Results (from the past 240 hour(s))  SARS Coronavirus 2 by RT PCR (hospital order, performed in [place] hospital lab) Nasopharyngeal Nasopharyngeal Swab     Status: None   Collection Time: 02/11/20  2:19 PM   Specimen: Nasopharyngeal Swab  Result Value Ref Range Status   SARS Coronavirus 2 NEGATIVE NEGATIVE Final    Comment: (NOTE) SARS-CoV-2 target nucleic acids are NOT DETECTED.  The SARS-CoV-2 RNA is generally detectable in upper and lower respiratory specimens  during the acute phase of infection. The lowest concentration of SARS-CoV-2 viral copies this assay can detect is 250 copies / mL. A negative result does not preclude SARS-CoV-2 infection and should not be used as the sole basis for treatment or other patient management decisions.  A negative result may occur with improper specimen collection / handling, submission of specimen other than nasopharyngeal swab, presence of viral mutation(s) within the areas targeted by this assay, and inadequate number of viral copies (<250 copies / mL). A negative result must be combined with clinical observations, patient history, and epidemiological information.  Fact Sheet for Patients:   StrictlyIdeas.no  Fact Sheet for Healthcare Providers: BankingDealers.co.za  This test is not yet approved or  cleared by the [place] FDA and has been authorized for detection and/or diagnosis of SARS-CoV-2 by FDA under an Emergency Use Authorization (EUA).  This EUA will remain in effect (meaning this test can be used) for the duration of the COVID-19 declaration under Section 564(b)(1) of the Act, 21 U.S.C. section 360bbb-3(b)(1), unless the authorization is terminated or revoked sooner.  Performed at [facility], [place] [address] [place], [place] [address]       Studies: CT HEAD WO CONTRAST  Result Date: 02/12/2020 CLINICAL DATA:  Facial trauma. Additional provided: Recent fall, right hip pain. EXAM: CT HEAD WITHOUT CONTRAST CT CERVICAL SPINE WITHOUT CONTRAST TECHNIQUE: Multidetector CT imaging of the head and cervical spine was performed following the standard protocol without intravenous contrast. Multiplanar CT image reconstructions of the cervical spine were also generated. COMPARISON:  Head CT 01/21/2019 FINDINGS: CT HEAD FINDINGS Brain: Stable, moderate generalized parenchymal atrophy. Stable, moderate patchy and ill-defined hypoattenuation within  the cerebral white matter which is nonspecific, but consistent with chronic small vessel ischemic disease. There is no acute intracranial hemorrhage. No demarcated cortical infarct. No extra-axial fluid collection. No evidence of intracranial mass. No midline shift. Vascular: No hyperdense vessel.  Atherosclerotic calcifications. Skull: No calvarial fracture or focal suspicious osseous lesion. Sinuses/Orbits: Visualized orbits show no acute finding. Mild ethmoid sinus mucosal thickening. No significant mastoid effusion CT CERVICAL SPINE FINDINGS Alignment: Straightening of the expected cervical lordosis. Trace C3-C4 grade 1 anterolisthesis. Skull base and vertebrae: The basion-dental and atlanto-dental intervals are maintained.No evidence of acute fracture to the cervical spine. Mild age-indeterminate T3 superior endplate compression deformity. Soft tissues and spinal canal: No prevertebral fluid or swelling. No visible canal hematoma. Disc levels: Cervical spondylosis with multilevel disc space narrowing, posterior disc osteophytes, uncovertebral and  facet hypertrophy. Disc space narrowing is advanced at C4-C5, C5-C6 and C6-C7. A C5-C6 posterior disc osteophyte complex contributes to at least mild/moderate spinal canal stenosis. Upper chest: No consolidation within the imaged lung apices. No visible pneumothorax atherosclerotic calcifications within the visualized aortic arch and proximal major branch vessels of the wit neck as well as bilateral carotid arteries. IMPRESSION: CT head: 1. No evidence of acute intracranial abnormality. 2. Stable moderate generalized parenchymal atrophy and chronic small vessel ischemic disease. 3. Mild ethmoid sinus mucosal thickening. CT cervical spine: 1. No evidence of acute fracture to the cervical spine. 2. Age-indeterminate mild T3 superior endplate compression fracture. 3. Cervical spondylosis as outlined. Electronically Signed   By: Kellie Simmering DO   On: 02/12/2020 09:31    CT CHEST W CONTRAST  Result Date: 02/11/2020 CLINICAL DATA:  79 year old female with history of unintended weight loss. EXAM: CT CHEST, ABDOMEN, AND PELVIS WITH CONTRAST TECHNIQUE: Multidetector CT imaging of the chest, abdomen and pelvis was performed following the standard protocol during bolus administration of intravenous contrast. CONTRAST:  75mL OMNIPAQUE IOHEXOL 300 MG/ML  SOLN COMPARISON:  No priors. FINDINGS: CT CHEST FINDINGS Cardiovascular: Heart size is normal. There is no significant pericardial fluid, thickening or pericardial calcification. There is aortic atherosclerosis, as well as atherosclerosis of the great vessels of the mediastinum and the coronary arteries, including calcified atherosclerotic plaque in the left main, left anterior descending, left circumflex and right coronary arteries. Mediastinum/Nodes: No pathologically enlarged mediastinal or hilar lymph nodes. Esophagus is unremarkable in appearance. No axillary lymphadenopathy. Lungs/Pleura: In the left lower lobe (axial image 40 of series 3 and coronal image 69 of series 6) there is a thick-walled cavitary mass measuring approximately 4.0 x 3.5 x 6.2 cm. This mass has macrolobulated and slightly spiculated margins. Internal air-fluid level. Trace left pleural effusion lying dependently. Additional mass-like area of architectural distortion in the base of the right lower lobe (axial image 129 of series 5) measuring 3.9 x 1.4 cm, favored to reflect atelectasis given adjacent areas of mucoid impaction and linear architectural distortion. Musculoskeletal: Chronic appearing compression fracture of T11 with 60% loss of anterior vertebral body height. There are no aggressive appearing lytic or blastic lesions noted in the visualized portions of the skeleton. CT ABDOMEN PELVIS FINDINGS Hepatobiliary: No suspicious cystic or solid hepatic lesions. No intra or extrahepatic biliary ductal dilatation. Gallbladder is normal in appearance.  Pancreas: No pancreatic mass. No pancreatic ductal dilatation. No pancreatic or peripancreatic fluid collections or inflammatory changes. Spleen: Unremarkable. Adrenals/Urinary Tract: Severe atrophy of the left kidney. Right kidney and bilateral adrenal glands are normal in appearance. No hydroureteronephrosis. Urinary bladder is moderately distended, but otherwise unremarkable in appearance. Stomach/Bowel: Normal appearance of the stomach. No pathologic dilatation of small bowel or colon. Numerous colonic diverticulae are noted, particularly in the sigmoid colon, without surrounding inflammatory changes to suggest an acute diverticulitis at this time. The appendix is not confidently identified and may be surgically absent. Regardless, there are no inflammatory changes noted adjacent to the cecum to suggest the presence of an acute appendicitis at this time. Vascular/Lymphatic: Aortic atherosclerosis, without evidence of aneurysm or dissection in the abdominal or pelvic vasculature. No lymphadenopathy noted in the abdomen or pelvis. Reproductive: Uterus and ovaries are atrophic. Other: No significant volume of ascites.  No pneumoperitoneum. Musculoskeletal: Right femoral neck fracture which appears foreshortened and slightly rotated. There are no aggressive appearing lytic or blastic lesions noted in the visualized portions of the skeleton. IMPRESSION: 1. 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 Trace left pleural effusion is likely malignant. 2. Additional mass-like area of architectural distortion in the base of the right lower lobe, favored to represent an area of possible rounded atelectasis, although close attention on follow-up studies is recommended to ensure stability or resolution. 3. No definite signs of metastatic disease to the abdomen or pelvis. 4. Right femoral neck fracture which is  mildly rotated and foreshortened. 5. Colonic diverticulosis without evidence of acute diverticulitis at this time. 6. Aortic atherosclerosis, in addition to left main and 3 vessel coronary artery disease. 7. Additional incidental findings, as above. Electronically Signed   By: Vinnie Langton M.D.   On: 02/11/2020 16:28   CT CERVICAL SPINE WO CONTRAST  Result Date: 02/12/2020 CLINICAL DATA:  Facial trauma. Additional provided: Recent fall, right hip pain. EXAM: CT HEAD WITHOUT CONTRAST CT CERVICAL SPINE WITHOUT CONTRAST TECHNIQUE: Multidetector CT imaging of the head and cervical spine was performed following the standard protocol without intravenous contrast. Multiplanar CT image reconstructions of the cervical spine were also generated. COMPARISON:  Head CT 01/21/2019 FINDINGS: CT HEAD FINDINGS Brain: Stable, moderate generalized parenchymal atrophy. Stable, moderate patchy and ill-defined hypoattenuation within the cerebral white matter which is nonspecific, but consistent with chronic small vessel ischemic disease. There is no acute intracranial hemorrhage. No demarcated cortical infarct. No extra-axial fluid collection. No evidence of intracranial mass. No midline shift. Vascular: No hyperdense vessel.  Atherosclerotic calcifications. Skull: No calvarial fracture or focal suspicious osseous lesion. Sinuses/Orbits: Visualized orbits show no acute finding. Mild ethmoid sinus mucosal thickening. No significant mastoid effusion CT CERVICAL SPINE FINDINGS Alignment: Straightening of the expected cervical lordosis. Trace C3-C4 grade 1 anterolisthesis. Skull base and vertebrae: The basion-dental and atlanto-dental intervals are maintained.No evidence of acute fracture to the cervical spine. Mild age-indeterminate T3 superior endplate compression deformity. Soft tissues and spinal canal: No prevertebral fluid or swelling. No visible canal hematoma. Disc levels: Cervical spondylosis with multilevel disc space  narrowing, posterior disc osteophytes, uncovertebral and facet hypertrophy. Disc space narrowing is advanced at C4-C5, C5-C6 and C6-C7. A C5-C6 posterior disc osteophyte complex contributes to at least mild/moderate spinal canal stenosis. Upper chest: No consolidation within the imaged lung apices. No visible pneumothorax atherosclerotic calcifications within the visualized aortic arch and proximal major branch vessels of the wit neck as well as bilateral carotid arteries. IMPRESSION: CT head: 1. No evidence of acute intracranial abnormality. 2. Stable moderate generalized parenchymal atrophy and chronic small vessel ischemic disease. 3. Mild ethmoid sinus mucosal thickening. CT cervical spine: 1. No evidence of acute fracture to the cervical spine. 2. Age-indeterminate mild T3 superior endplate compression fracture. 3. Cervical spondylosis as outlined. Electronically Signed   By: Kellie Simmering DO   On: 02/12/2020 09:31   CT ABDOMEN PELVIS W CONTRAST  Result Date: 02/11/2020 CLINICAL DATA:  79 year old female with history of unintended weight loss. EXAM: CT CHEST, ABDOMEN, AND PELVIS WITH CONTRAST TECHNIQUE: Multidetector CT imaging of the chest, abdomen and pelvis was performed following the standard protocol during bolus administration of intravenous contrast. CONTRAST:  79mL OMNIPAQUE IOHEXOL 300 MG/ML  SOLN COMPARISON:  No priors. FINDINGS: CT CHEST FINDINGS Cardiovascular: Heart size is normal. There is no significant pericardial fluid, thickening or pericardial calcification. There is aortic atherosclerosis, as well as atherosclerosis of the great vessels of the mediastinum and the coronary arteries, including calcified atherosclerotic plaque in the left main,  left anterior descending, left circumflex and right coronary arteries. Mediastinum/Nodes: No pathologically enlarged mediastinal or hilar lymph nodes. Esophagus is unremarkable in appearance. No axillary lymphadenopathy. Lungs/Pleura: In the left  lower lobe (axial image 40 of series 3 and coronal image 69 of series 6) there is a thick-walled cavitary mass measuring approximately 4.0 x 3.5 x 6.2 cm. This mass has macrolobulated and slightly spiculated margins. Internal air-fluid level. Trace left pleural effusion lying dependently. Additional mass-like area of architectural distortion in the base of the right lower lobe (axial image 129 of series 5) measuring 3.9 x 1.4 cm, favored to reflect atelectasis given adjacent areas of mucoid impaction and linear architectural distortion. Musculoskeletal: Chronic appearing compression fracture of T11 with 60% loss of anterior vertebral body height. There are no aggressive appearing lytic or blastic lesions noted in the visualized portions of the skeleton. CT ABDOMEN PELVIS FINDINGS Hepatobiliary: No suspicious cystic or solid hepatic lesions. No intra or extrahepatic biliary ductal dilatation. Gallbladder is normal in appearance. Pancreas: No pancreatic mass. No pancreatic ductal dilatation. No pancreatic or peripancreatic fluid collections or inflammatory changes. Spleen: Unremarkable. Adrenals/Urinary Tract: Severe atrophy of the left kidney. Right kidney and bilateral adrenal glands are normal in appearance. No hydroureteronephrosis. Urinary bladder is moderately distended, but otherwise unremarkable in appearance. Stomach/Bowel: Normal appearance of the stomach. No pathologic dilatation of small bowel or colon. Numerous colonic diverticulae are noted, particularly in the sigmoid colon, without surrounding inflammatory changes to suggest an acute diverticulitis at this time. The appendix is not confidently identified and may be surgically absent. Regardless, there are no inflammatory changes noted adjacent to the cecum to suggest the presence of an acute appendicitis at this time. Vascular/Lymphatic: Aortic atherosclerosis, without evidence of aneurysm or dissection in the abdominal or pelvic vasculature. No  lymphadenopathy noted in the abdomen or pelvis. Reproductive: Uterus and ovaries are atrophic. Other: No significant volume of ascites.  No pneumoperitoneum. Musculoskeletal: Right femoral neck fracture which appears foreshortened and slightly rotated. There are no aggressive appearing lytic or blastic lesions noted in the visualized portions of the skeleton. IMPRESSION: 1. 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 Trace left pleural effusion is likely malignant. 2. Additional mass-like area of architectural distortion in the base of the right lower lobe, favored to represent an area of possible rounded atelectasis, although close attention on follow-up studies is recommended to ensure stability or resolution. 3. No definite signs of metastatic disease to the abdomen or pelvis. 4. Right femoral neck fracture which is mildly rotated and foreshortened. 5. Colonic diverticulosis without evidence of acute diverticulitis at this time. 6. Aortic atherosclerosis, in addition to left main and 3 vessel coronary artery disease. 7. Additional incidental findings, as above. Electronically Signed   By: Vinnie Langton M.D.   On: 02/11/2020 16:28    Scheduled Meds: . busPIRone  15 mg Oral BID  . feeding supplement (ENSURE ENLIVE)  237 mL Oral TID BM  . loratadine  10 mg Oral Daily  . losartan  12.5 mg Oral Daily  . mirtazapine  15 mg Oral QHS  . mometasone-formoterol  2 puff Inhalation BID  . multivitamin with minerals  1 tablet Oral Daily  . nicotine  7 mg Transdermal Daily  . senna  1 tablet Oral BID    Continuous Infusions: . sodium chloride 75 mL/hr at 02/12/20 0559     LOS: 1 day  Kayleen Memos, MD [facility] Pager [phone]  If 7PM-7AM, please contact night-coverage [email or web address] Password [facility] 02/12/2020, 3:05 PM

## 2020-02-12 NOTE — Plan of Care (Signed)

## 2020-02-12 NOTE — Progress Notes (Signed)
Patient ID: Bianca [name], female   DOB: [birth year], 79 y.o.   MRN: [id number] Dr. Tamera Punt with orthopedics was consulted on this patient.  She does have a right hip femoral neck fracture.  Since I have taking care of family members before, I was requested to take over the orthopedic care of this patient.  Dr. Tamera Punt did contact me yesterday about the patient.  I did inform him that I am on vacation and I am also off today.  I am back tomorrow (Tuesday 7/27) and am operating in the afternoon at [facility] tomorrow.  My plan will be to put her on the operating room schedule for tomorrow afternoon for a right hip replacement.  She should be n.p.o. after midnight tonight.

## 2020-02-12 NOTE — Plan of Care (Signed)

## 2020-02-13 ENCOUNTER — Encounter (HOSPITAL_COMMUNITY)
Admission: EM | Disposition: A | Discharge status: Skilled Nursing Facility | Payer: Self-pay | Source: Home / Self Care | Attending: Family Medicine

## 2020-02-13 ENCOUNTER — Ambulatory Visit: Admission: RE | Admit: 2020-02-13 | Payer: PPO | Source: Ambulatory Visit

## 2020-02-13 ENCOUNTER — Encounter: Payer: Self-pay | Admitting: *Deleted

## 2020-02-13 ENCOUNTER — Inpatient Hospital Stay (HOSPITAL_COMMUNITY): Payer: PPO | Admitting: Anesthesiology

## 2020-02-13 ENCOUNTER — Inpatient Hospital Stay (HOSPITAL_COMMUNITY): Payer: PPO

## 2020-02-13 ENCOUNTER — Ambulatory Visit: Payer: Self-pay | Admitting: *Deleted

## 2020-02-13 ENCOUNTER — Encounter (HOSPITAL_COMMUNITY): Payer: Self-pay | Admitting: Internal Medicine

## 2020-02-13 DIAGNOSIS — I10 Essential (primary) hypertension: Secondary | ICD-10-CM

## 2020-02-13 DIAGNOSIS — S72041A Displaced fracture of base of neck of right femur, initial encounter for closed fracture: Secondary | ICD-10-CM

## 2020-02-13 HISTORY — PX: TOTAL HIP ARTHROPLASTY: SHX124

## 2020-02-13 LAB — COMPREHENSIVE METABOLIC PANEL
ALT: 13 U/L (ref 0–44)
AST: 22 U/L (ref 15–41)
Albumin: 2.6 g/dL — ABNORMAL LOW (ref 3.5–5.0)
Alkaline Phosphatase: 64 U/L (ref 38–126)
Anion gap: 8 (ref 5–15)
BUN: 11 mg/dL (ref 8–23)
CO2: 22 mmol/L (ref 22–32)
Calcium: 8.4 mg/dL — ABNORMAL LOW (ref 8.9–10.3)
Chloride: 106 mmol/L (ref 98–111)
Creatinine, Ser: 1.03 mg/dL — ABNORMAL HIGH (ref 0.44–1.00)
GFR calc Af Amer: 59 mL/min — ABNORMAL LOW (ref >60)
GFR calc non Af Amer: 51 mL/min — ABNORMAL LOW (ref >60)
Glucose, Bld: 106 mg/dL — ABNORMAL HIGH (ref 70–99)
Potassium: 3.6 mmol/L (ref 3.5–5.1)
Sodium: 136 mmol/L (ref 135–145)
Total Bilirubin: 0.9 mg/dL (ref 0.3–1.2)
Total Protein: 6.2 g/dL — ABNORMAL LOW (ref 6.5–8.1)

## 2020-02-13 LAB — CBC
HCT: 32.7 % — ABNORMAL LOW (ref 36.0–46.0)
Hemoglobin: 10.2 g/dL — ABNORMAL LOW (ref 12.0–15.0)
MCH: 25.6 pg — ABNORMAL LOW (ref 26.0–34.0)
MCHC: 31.2 g/dL (ref 30.0–36.0)
MCV: 82.2 fL (ref 80.0–100.0)
Platelets: 335 10*3/uL (ref 150–400)
RBC: 3.98 MIL/uL (ref 3.87–5.11)
RDW: 15.1 % (ref 11.5–15.5)
WBC: 10.9 10*3/uL — ABNORMAL HIGH (ref 4.0–10.5)
nRBC: 0 % (ref 0.0–0.2)

## 2020-02-13 LAB — MAGNESIUM: Magnesium: 1.9 mg/dL (ref 1.7–2.4)

## 2020-02-13 LAB — PHOSPHORUS: Phosphorus: 2.4 mg/dL — ABNORMAL LOW (ref 2.5–4.6)

## 2020-02-13 LAB — PROTIME-INR
INR: 1.1 (ref 0.8–1.2)
Prothrombin Time: 14.1 seconds (ref 11.4–15.2)

## 2020-02-13 SURGERY — ARTHROPLASTY, HIP, TOTAL, ANTERIOR APPROACH
Anesthesia: General | Site: Hip | Laterality: Right

## 2020-02-13 MED ORDER — CEFAZOLIN SODIUM-DEXTROSE 2-4 GM/100ML-% IV SOLN
INTRAVENOUS | Status: AC
  Filled 2020-02-13: qty 100

## 2020-02-13 MED ORDER — OXYCODONE HCL 5 MG/5ML PO SOLN: 5.0000 mg | Freq: Once | ORAL | Status: DC | PRN

## 2020-02-13 MED ORDER — ONDANSETRON HCL 4 MG/2ML IJ SOLN
INTRAMUSCULAR | Status: AC
  Filled 2020-02-13: qty 2

## 2020-02-13 MED ORDER — TRANEXAMIC ACID-NACL 1000-0.7 MG/100ML-% IV SOLN
INTRAVENOUS | Status: DC | PRN
  Administered 2020-02-13: 1000 mg via INTRAVENOUS

## 2020-02-13 MED ORDER — ONDANSETRON HCL 4 MG/2ML IJ SOLN
4.0000 mg | Freq: Four times a day (QID) | INTRAMUSCULAR | Status: AC | PRN
  Administered 2020-02-13: 4 mg via INTRAVENOUS

## 2020-02-13 MED ORDER — CEFAZOLIN SODIUM-DEXTROSE 2-3 GM-%(50ML) IV SOLR
INTRAVENOUS | Status: DC | PRN
Start: 2020-02-13 — End: 2020-02-13
  Administered 2020-02-13: 2 g via INTRAVENOUS

## 2020-02-13 MED ORDER — PHENOL 1.4 % MT LIQD: 1.0000 | OROMUCOSAL | Status: DC | PRN

## 2020-02-13 MED ORDER — AMLODIPINE BESYLATE 5 MG PO TABS
5.0000 mg | ORAL_TABLET | Freq: Every day | ORAL | Status: DC
  Administered 2020-02-13 – 2020-02-19 (×7): 5 mg via ORAL
  Filled 2020-02-13 (×8): qty 1

## 2020-02-13 MED ORDER — 0.9 % SODIUM CHLORIDE (POUR BTL) OPTIME
TOPICAL | Status: DC | PRN
  Administered 2020-02-13: 1000 mL

## 2020-02-13 MED ORDER — ONDANSETRON HCL 4 MG/2ML IJ SOLN
INTRAMUSCULAR | Status: DC | PRN
  Administered 2020-02-13: 4 mg via INTRAVENOUS

## 2020-02-13 MED ORDER — PHENYLEPHRINE HCL-NACL 10-0.9 MG/250ML-% IV SOLN
INTRAVENOUS | Status: DC | PRN
Start: 2020-02-13 — End: 2020-02-13
  Administered 2020-02-13: 40 ug/min via INTRAVENOUS

## 2020-02-13 MED ORDER — METHOCARBAMOL 1000 MG/10ML IJ SOLN
500.0000 mg | Freq: Four times a day (QID) | INTRAVENOUS | Status: DC | PRN
  Filled 2020-02-13: qty 5

## 2020-02-13 MED ORDER — SODIUM CHLORIDE 0.9 % IR SOLN
Status: DC | PRN
  Administered 2020-02-13: 1000 mL

## 2020-02-13 MED ORDER — CEFAZOLIN SODIUM-DEXTROSE 2-4 GM/100ML-% IV SOLN
2.0000 g | Freq: Four times a day (QID) | INTRAVENOUS | Status: AC
  Administered 2020-02-13 – 2020-02-14 (×2): 2 g via INTRAVENOUS
  Filled 2020-02-13 (×2): qty 100

## 2020-02-13 MED ORDER — HYDROCODONE-ACETAMINOPHEN 7.5-325 MG PO TABS: 1.0000 | ORAL_TABLET | ORAL | Status: DC | PRN

## 2020-02-13 MED ORDER — CHLORHEXIDINE GLUCONATE 0.12 % MT SOLN: 15.0000 mL | Freq: Once | OROMUCOSAL | Status: AC

## 2020-02-13 MED ORDER — FENTANYL CITRATE (PF) 100 MCG/2ML IJ SOLN: 25.0000 ug | INTRAMUSCULAR | Status: DC | PRN

## 2020-02-13 MED ORDER — POTASSIUM CHLORIDE CRYS ER 20 MEQ PO TBCR
20.0000 meq | EXTENDED_RELEASE_TABLET | Freq: Once | ORAL | Status: AC
  Administered 2020-02-13: 20 meq via ORAL
  Filled 2020-02-13: qty 1

## 2020-02-13 MED ORDER — TRANEXAMIC ACID-NACL 1000-0.7 MG/100ML-% IV SOLN
INTRAVENOUS | Status: AC
  Filled 2020-02-13: qty 100

## 2020-02-13 MED ORDER — METHOCARBAMOL 500 MG PO TABS: 500.0000 mg | ORAL_TABLET | Freq: Four times a day (QID) | ORAL | Status: DC | PRN

## 2020-02-13 MED ORDER — METOCLOPRAMIDE HCL 5 MG/ML IJ SOLN: 5.0000 mg | Freq: Three times a day (TID) | INTRAMUSCULAR | Status: DC | PRN

## 2020-02-13 MED ORDER — DOCUSATE SODIUM 100 MG PO CAPS
100.0000 mg | ORAL_CAPSULE | Freq: Two times a day (BID) | ORAL | Status: DC
  Administered 2020-02-13 – 2020-02-14 (×2): 100 mg via ORAL
  Filled 2020-02-13 (×2): qty 1

## 2020-02-13 MED ORDER — HYDROCODONE-ACETAMINOPHEN 5-325 MG PO TABS
1.0000 | ORAL_TABLET | ORAL | Status: DC | PRN
  Administered 2020-02-14: 2 via ORAL
  Filled 2020-02-13: qty 2

## 2020-02-13 MED ORDER — DEXAMETHASONE SODIUM PHOSPHATE 10 MG/ML IJ SOLN
INTRAMUSCULAR | Status: AC
  Filled 2020-02-13: qty 1

## 2020-02-13 MED ORDER — METOPROLOL TARTRATE 5 MG/5ML IV SOLN
2.5000 mg | Freq: Four times a day (QID) | INTRAVENOUS | Status: DC | PRN
  Administered 2020-02-13: 2.5 mg via INTRAVENOUS
  Filled 2020-02-13: qty 5

## 2020-02-13 MED ORDER — FENTANYL CITRATE (PF) 250 MCG/5ML IJ SOLN
INTRAMUSCULAR | Status: DC | PRN
  Administered 2020-02-13 (×2): 50 ug via INTRAVENOUS
  Administered 2020-02-13: 100 ug via INTRAVENOUS

## 2020-02-13 MED ORDER — LIDOCAINE 2% (20 MG/ML) 5 ML SYRINGE
INTRAMUSCULAR | Status: AC
  Filled 2020-02-13: qty 5

## 2020-02-13 MED ORDER — PROPOFOL 10 MG/ML IV BOLUS
INTRAVENOUS | Status: DC | PRN
Start: 2020-02-13 — End: 2020-02-13
  Administered 2020-02-13: 70 mg via INTRAVENOUS

## 2020-02-13 MED ORDER — FENTANYL CITRATE (PF) 250 MCG/5ML IJ SOLN
INTRAMUSCULAR | Status: AC
  Filled 2020-02-13: qty 5

## 2020-02-13 MED ORDER — ONDANSETRON HCL 4 MG/2ML IJ SOLN: 4.0000 mg | Freq: Four times a day (QID) | INTRAMUSCULAR | Status: DC | PRN

## 2020-02-13 MED ORDER — ROCURONIUM BROMIDE 10 MG/ML (PF) SYRINGE
PREFILLED_SYRINGE | INTRAVENOUS | Status: AC
  Filled 2020-02-13: qty 10

## 2020-02-13 MED ORDER — CHLORHEXIDINE GLUCONATE 0.12 % MT SOLN
OROMUCOSAL | Status: AC
  Administered 2020-02-13: 15 mL via OROMUCOSAL
  Filled 2020-02-13: qty 15

## 2020-02-13 MED ORDER — ROCURONIUM BROMIDE 100 MG/10ML IV SOLN
INTRAVENOUS | Status: DC | PRN
Start: 2020-02-13 — End: 2020-02-13
  Administered 2020-02-13: 50 mg via INTRAVENOUS

## 2020-02-13 MED ORDER — ONDANSETRON HCL 4 MG PO TABS: 4.0000 mg | ORAL_TABLET | Freq: Four times a day (QID) | ORAL | Status: DC | PRN

## 2020-02-13 MED ORDER — MENTHOL 3 MG MT LOZG: 1.0000 | LOZENGE | OROMUCOSAL | Status: DC | PRN

## 2020-02-13 MED ORDER — LACTATED RINGERS IV SOLN: INTRAVENOUS | Status: DC

## 2020-02-13 MED ORDER — SUGAMMADEX SODIUM 200 MG/2ML IV SOLN
INTRAVENOUS | Status: DC | PRN
  Administered 2020-02-13: 100 mg via INTRAVENOUS

## 2020-02-13 MED ORDER — ACETAMINOPHEN 325 MG PO TABS: 325.0000 mg | ORAL_TABLET | Freq: Four times a day (QID) | ORAL | Status: DC | PRN

## 2020-02-13 MED ORDER — MORPHINE SULFATE (PF) 2 MG/ML IV SOLN: 0.5000 mg | INTRAVENOUS | Status: DC | PRN

## 2020-02-13 MED ORDER — METOCLOPRAMIDE HCL 5 MG PO TABS: 5.0000 mg | ORAL_TABLET | Freq: Three times a day (TID) | ORAL | Status: DC | PRN

## 2020-02-13 MED ORDER — SODIUM PHOSPHATES 45 MMOLE/15ML IV SOLN
10.0000 mmol | Freq: Once | INTRAVENOUS | Status: AC
  Administered 2020-02-13: 10 mmol via INTRAVENOUS
  Filled 2020-02-13: qty 3.33

## 2020-02-13 MED ORDER — OXYCODONE HCL 5 MG PO TABS: 5.0000 mg | ORAL_TABLET | Freq: Once | ORAL | Status: DC | PRN

## 2020-02-13 MED ORDER — DEXAMETHASONE SODIUM PHOSPHATE 10 MG/ML IJ SOLN
INTRAMUSCULAR | Status: DC | PRN
Start: 2020-02-13 — End: 2020-02-13
  Administered 2020-02-13: 10 mg via INTRAVENOUS

## 2020-02-13 SURGICAL SUPPLY — 55 items
APL SKNCLS STERI-STRIP NONHPOA (GAUZE/BANDAGES/DRESSINGS) ×1
BENZOIN TINCTURE PRP APPL 2/3 (GAUZE/BANDAGES/DRESSINGS) ×2 IMPLANT
BLADE CLIPPER SURG (BLADE) IMPLANT
BLADE SAW SGTL 18X1.27X75 (BLADE) ×2 IMPLANT
COVER SURGICAL LIGHT HANDLE (MISCELLANEOUS) ×2 IMPLANT
COVER WAND RF STERILE (DRAPES) ×2 IMPLANT
CUP SECTOR GRIPTON 50MM (Cup) ×2 IMPLANT
DRAPE C-ARM 42X72 X-RAY (DRAPES) ×2 IMPLANT
DRAPE STERI IOBAN 125X83 (DRAPES) ×2 IMPLANT
DRAPE U-SHAPE 47X51 STRL (DRAPES) ×6 IMPLANT
DRSG AQUACEL AG ADV 3.5X10 (GAUZE/BANDAGES/DRESSINGS) ×2 IMPLANT
DRSG XEROFORM 1X8 (GAUZE/BANDAGES/DRESSINGS) ×1 IMPLANT
DURAPREP 26ML APPLICATOR (WOUND CARE) ×2 IMPLANT
ELECT BLADE 4.0 EZ CLEAN MEGAD (MISCELLANEOUS) ×2
ELECT BLADE 6.5 EXT (BLADE) IMPLANT
ELECT REM PT RETURN 9FT ADLT (ELECTROSURGICAL) ×2
ELECTRODE BLDE 4.0 EZ CLN MEGD (MISCELLANEOUS) ×1 IMPLANT
ELECTRODE REM PT RTRN 9FT ADLT (ELECTROSURGICAL) ×1 IMPLANT
FACESHIELD WRAPAROUND (MASK) ×4 IMPLANT
FACESHIELD WRAPAROUND OR TEAM (MASK) ×2 IMPLANT
GLOVE BIOGEL PI IND STRL 8 (GLOVE) ×2 IMPLANT
GLOVE BIOGEL PI INDICATOR 8 (GLOVE) ×2
GLOVE ECLIPSE 8.0 STRL XLNG CF (GLOVE) ×2 IMPLANT
GLOVE ORTHO TXT STRL SZ7.5 (GLOVE) ×4 IMPLANT
GOWN STRL REUS W/ TWL LRG LVL3 (GOWN DISPOSABLE) ×2 IMPLANT
GOWN STRL REUS W/ TWL XL LVL3 (GOWN DISPOSABLE) ×2 IMPLANT
GOWN STRL REUS W/TWL LRG LVL3 (GOWN DISPOSABLE) ×4
GOWN STRL REUS W/TWL XL LVL3 (GOWN DISPOSABLE) ×4
HANDPIECE INTERPULSE COAX TIP (DISPOSABLE) ×2
HEAD FEM STD 32X+1 STRL (Hips) ×2 IMPLANT
KIT BASIN OR (CUSTOM PROCEDURE TRAY) ×2 IMPLANT
KIT TURNOVER KIT B (KITS) ×2 IMPLANT
LINER ACETABULAR 32X50 (Liner) ×2 IMPLANT
MANIFOLD NEPTUNE II (INSTRUMENTS) ×2 IMPLANT
NS IRRIG 1000ML POUR BTL (IV SOLUTION) ×2 IMPLANT
PACK TOTAL JOINT (CUSTOM PROCEDURE TRAY) ×2 IMPLANT
PAD ARMBOARD 7.5X6 YLW CONV (MISCELLANEOUS) ×2 IMPLANT
SET HNDPC FAN SPRY TIP SCT (DISPOSABLE) ×1 IMPLANT
STAPLER VISISTAT 35W (STAPLE) IMPLANT
STEM CORAIL KA12 (Stem) ×2 IMPLANT
STRIP CLOSURE SKIN 1/2X4 (GAUZE/BANDAGES/DRESSINGS) ×4 IMPLANT
SUT ETHIBOND NAB CT1 #1 30IN (SUTURE) ×2 IMPLANT
SUT MNCRL AB 4-0 PS2 18 (SUTURE) IMPLANT
SUT VIC AB 0 CT1 27 (SUTURE) ×2
SUT VIC AB 0 CT1 27XBRD ANBCTR (SUTURE) ×1 IMPLANT
SUT VIC AB 1 CT1 27 (SUTURE) ×2
SUT VIC AB 1 CT1 27XBRD ANBCTR (SUTURE) ×1 IMPLANT
SUT VIC AB 2-0 CT1 27 (SUTURE) ×2
SUT VIC AB 2-0 CT1 TAPERPNT 27 (SUTURE) ×1 IMPLANT
TOWEL GREEN STERILE (TOWEL DISPOSABLE) ×2 IMPLANT
TOWEL GREEN STERILE FF (TOWEL DISPOSABLE) ×2 IMPLANT
TRAY CATH 16FR W/PLASTIC CATH (SET/KITS/TRAYS/PACK) IMPLANT
TRAY FOLEY W/BAG SLVR 16FR (SET/KITS/TRAYS/PACK)
TRAY FOLEY W/BAG SLVR 16FR ST (SET/KITS/TRAYS/PACK) IMPLANT
WATER STERILE IRR 1000ML POUR (IV SOLUTION) ×4 IMPLANT

## 2020-02-13 NOTE — Op Note (Signed)
NAME: Bianca [name], Bianca [name] MEDICAL RECORD UK:[id number] ACCOUNT [id number] DATE OF BIRTH:February 25, 1941 FACILITY: [facility] LOCATION: [facility] PHYSICIAN:Smriti Barkow Kerry Fort, MD  OPERATIVE REPORT  DATE OF PROCEDURE:  02/13/2020  PREOPERATIVE DIAGNOSIS:  Right hip displaced femoral neck fracture.  POSTOPERATIVE DIAGNOSIS:  Right hip displaced femoral neck fracture.  PROCEDURE:  Right total hip arthroplasty through direct anterior approach.  IMPLANTS:  DePuy Sector Gription acetabular component size 50, size 32+0 neutral polyethylene liner, size 12 Corail femoral component with standard offset, size 32+1 metal hip ball.  SURGEON:  Lind Guest.  Ninfa Linden, MD  ANESTHESIA:  General.  ANTIBIOTICS:  Two g IV Ancef.  ESTIMATED BLOOD LOSS:  200 mL.  COMPLICATIONS:  None.  INDICATIONS:  The patient is an 79 year old female who sustained a mechanical fall this past Sunday, injuring her right hip.  She was found to have a displaced right hip femoral neck fracture.  Surgery was delayed until today due to the fact the patient  was on Eliquis and had her Sunday dose.  Her and her family understand this will be done under general anesthesia.  I talked in detail about the risks and benefits of the surgery.  She is someone who does ambulate regularly and had some previous right  hip pain, but no issues with walking.  When she is sitting, this mechanical fall completely displaced her femoral neck.  I recommended total hip arthroplasty through direct anterior approach.  I described in detail what this surgery involves.  Her and  her family did wish to proceed.  We talked about the risk of acute blood loss anemia, nerve or vessel injury, fracture, infection, dislocation, DVT and implant failure.  We talked about our goals being decreasing her pain and improving her mobility and  improving her quality of life.  DESCRIPTION OF PROCEDURE:  After informed consent was obtained, the appropriate right hip was marked.   She was brought to the operating room where general anesthesia was obtained while she was on a stretcher.  Traction boots were placed on both her feet  and she was placed supine on the Hana fracture table, the perineal post in place and both legs in in-line skeletal traction device and no traction applied.  Her right operative hip was prepped and draped with DuraPrep and sterile drapes.  A time-out was  called and she was identified as correct patient, correct right hip.  I then made an incision just inferior and posterior to the anterior superior iliac spine and carried this obliquely down the leg.  We dissected down to the tensor fascia lata muscle.   The tensor fascia was then divided longitudinally to proceed with direct anterior approach to the hip.  I identified and cauterized circumflex vessels and opened up the hip capsule in an L-type format, finding a moderate hemarthrosis.  We made our  femoral neck cut just proximal to the lesser trochanter and distal to her femoral neck fracture, which was completely displaced.  We placed a corkscrew guide in the femoral head and removed the femoral head in its entirety.  I then removed bony fragments  from the acetabulum and from around the neck area.  I released the lateral joint capsule and also the transverse acetabular ligament.  I removed the remnants of the acetabular labrum.  We then began reaming under direct visualization from a size 44  reamer in stepwise increments going up to a size 49.  With all reamers placed under direct visualization, the last reamer was also placed under direct  fluoroscopy, so I could obtain my depth of reaming, our inclination and anteversion.  I then placed the  real DePuy Sector Gription acetabular component size 50 and a 32+0 neutral polyethylene liner for that size acetabular component.  Attention was then turned to the femur.  With the leg externally rotated to 120 degrees, extended and adducted, we are to  place a  Mueller retractor medially and a Hohmann retractor above the greater trochanter.  We then used a box-cutting osteotome to enter the femoral canal and a rongeur to lateralize.  We then began broaching using the Corail broaching system from a size  8 going up to a size 12.  With a size 12 in place, we trialed a standard offset femoral neck and a 32+1 hip ball, reduced this in the acetabulum.  We were pleased with the leg length, offset, range of motion and stability and implant placement.  We then  dislocated the hip and removed the trial components.  We placed the real Corail femoral component size 12 and the real 32+1 metal hip ball and again reduced this in the acetabulum.  We were pleased with stability, assessed mechanically and  radiographically.  We then irrigated the soft tissue with normal saline solution using pulsatile lavage.  We reapproximated the joint capsule with interrupted #1 Ethibond suture, followed by closing the tensor fascia with #1 Vicryl.  Zero Vicryl was used  to close deep tissue and 2-0 Vicryl was used to close the subcutaneous tissue.  Staples were used to close the skin.  Xeroform and Aquacel dressing was applied.  The patient was taken off the Hana table, awakened, extubated, and taken to the recovery  room in stable condition.  All final counts were correct.  There were no complications noted.  VN/NUANCE  D:02/13/2020 T:02/13/2020 JOB:[id number]

## 2020-02-13 NOTE — Chronic Care Management (AMB) (Signed)
Chronic Care Management   Social Work Note  02/13/2020 Name: Bianca [name] MRN: [id number] DOB: 1941/03/05  Bianca [name] is a 79 y.o. year old female who sees Trinna Post, [place] for primary care. The CCM team was consulted for assistance with Mental Health Counseling and Resources.   Phone call to patient, spoke with her daughter Bianca [name] who stated that patient fell, broke her hip and now will be going in to surgery today. The probability of patient needed rehab following her hospital stay discussed as well.  SDOH (Social Determinants of Health) assessments performed: No     Facility-Administered Encounter Medications as of 02/13/2020  Medication  . 0.9 %  sodium chloride infusion  . acetaminophen (TYLENOL) tablet 650 mg  . albuterol (PROVENTIL) (2.5 MG/3ML) 0.083% nebulizer solution 3 mL  . amLODipine (NORVASC) tablet 5 mg  . busPIRone (BUSPAR) tablet 15 mg  . feeding supplement (ENSURE ENLIVE) (ENSURE ENLIVE) liquid 237 mL  . HYDROcodone-acetaminophen (NORCO/VICODIN) 5-325 MG per tablet 1-2 tablet  . loratadine (CLARITIN) tablet 10 mg  . losartan (COZAAR) tablet 12.5 mg  . metoprolol tartrate (LOPRESSOR) injection 2.5 mg  . mirtazapine (REMERON) tablet 15 mg  . mometasone-formoterol (DULERA) 100-5 MCG/ACT inhaler 2 puff  . morphine 2 MG/ML injection 0.5 mg  . multivitamin with minerals tablet 1 tablet  . nicotine (NICODERM CQ - dosed in mg/24 hr) patch 7 mg  . ondansetron (ZOFRAN) injection 4 mg  . senna (SENOKOT) tablet 8.6 mg  . sodium phosphate 10 mmol in dextrose 5 % 250 mL infusion   Outpatient Encounter Medications as of 02/13/2020  Medication Sig Note  . acetaminophen (TYLENOL) 500 MG tablet Take 1,000 mg by mouth every 6 (six) hours as needed for headache (pain).   . ADVAIR DISKUS 100-50 MCG/DOSE AEPB INHALE 1 PUFF TWICE A DAY AS DIRECTED **RINSE MOUTH AFTER USE** (Patient taking differently: Inhale 1 puff into the lungs in the morning and at bedtime. Rinse mouth  after use)   . apixaban (ELIQUIS) 2.5 MG TABS tablet Take 1 tablet (2.5 mg total) by mouth 2 (two) times daily.   . busPIRone (BUSPAR) 15 MG tablet Take 1 tablet (15 mg total) by mouth 2 (two) times daily. (Patient taking differently: Take 15 mg by mouth at bedtime. )   . Calcium Carb-Cholecalciferol (CALCIUM+D3 PO) Take 1 tablet by mouth daily.   . Cholecalciferol (VITAMIN D3) 50 MCG (2000 UT) TABS Take 2,000 Units by mouth daily.    Marland Kitchen denosumab (PROLIA) 60 MG/ML SOSY injection Inject 60 mg into the skin every 6 (six) months.   . FEXOFENADINE HCL PO Take 1 tablet by mouth daily.  02/11/2020: OTC  . lactose free nutrition (BOOST) LIQD Take 237 mLs by mouth 2 (two) times daily between meals.   Marland Kitchen losartan (COZAAR) 25 MG tablet Take 1/2 tablet as needed for systolic blood pressure over 180. (Patient taking differently: Take 12.5 mg by mouth daily as needed (SBP >180). )   . midodrine (PROAMATINE) 5 MG tablet Take one tablet by mouth as needed for dizziness/lightheadedness.  May take up to 4 tablets in a 24 hour period. (Patient taking differently: Take 5 mg by mouth daily as needed (dizziness/lightheadedness). )   . mirtazapine (REMERON) 15 MG tablet Take 1 tablet (15 mg total) by mouth at bedtime.   Marland Kitchen PROAIR HFA 108 (90 Base) MCG/ACT inhaler INHALE 1-2 PUFFS INTO THE LUNGS EVERY 4 HOURS AS NEEDED (Patient taking differently: Inhale 1-2 puffs into the lungs every  4 (four) hours as needed for wheezing or shortness of breath. )   . 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 vitamin C (ASCORBIC ACID) 500 MG tablet Take 500 mg by mouth daily.      Goals Addressed   None     Follow Up Plan: SW will follow up with patient by phone over the next 3-4 weeks   [place] Care Management [phone]

## 2020-02-13 NOTE — Plan of Care (Signed)

## 2020-02-13 NOTE — Transfer of Care (Signed)
Immediate Anesthesia Transfer of Care Note  Patient: Bianca [name]  Procedure(s) Performed: TOTAL HIP ARTHROPLASTY ANTERIOR APPROACH (Right Hip)  Patient Location: PACU  Anesthesia Type:General  Level of Consciousness: drowsy  Airway & Oxygen Therapy: Patient Spontanous Breathing  Post-op Assessment: Report given to RN and Post -op Vital signs reviewed and stable  Post vital signs: Reviewed and stable  Last Vitals:  Vitals Value Taken Time  BP    Temp    Pulse    Resp    SpO2      Last Pain:  Vitals:   02/13/20 1046  TempSrc:   PainSc: 1       Patients Stated Pain Goal: 3 (82/70/78 6754)  Complications: No complications documented.

## 2020-02-13 NOTE — Anesthesia Preprocedure Evaluation (Signed)
Anesthesia Evaluation  Patient identified by MRN, date of birth, ID band Patient awake    Reviewed: Allergy & Precautions, H&P , NPO status , Patient's Chart, lab work & pertinent test results  Airway Mallampati: II   Neck ROM: full    Dental   Pulmonary asthma , COPD, Current Smoker,    breath sounds clear to auscultation       Cardiovascular hypertension, + dysrhythmias Atrial Fibrillation  Rhythm:irregular Rate:Normal  Takes eliquis   Neuro/Psych PSYCHIATRIC DISORDERS Anxiety Depression    GI/Hepatic   Endo/Other    Renal/GU      Musculoskeletal  (+) Arthritis ,   Abdominal   Peds  Hematology   Anesthesia Other Findings   Reproductive/Obstetrics                             Anesthesia Physical Anesthesia Plan  ASA: III  Anesthesia Plan: General   Post-op Pain Management:    Induction: Intravenous  PONV Risk Score and Plan: 2 and Ondansetron, Dexamethasone and Treatment may vary due to age or medical condition  Airway Management Planned: Oral ETT  Additional Equipment:   Intra-op Plan:   Post-operative Plan: Extubation in OR  Informed Consent: I have reviewed the patients History and Physical, chart, labs and discussed the procedure including the risks, benefits and alternatives for the proposed anesthesia with the patient or authorized representative who has indicated his/her understanding and acceptance.       Plan Discussed with: CRNA, Anesthesiologist and Surgeon  Anesthesia Plan Comments:         Anesthesia Quick Evaluation

## 2020-02-13 NOTE — Anesthesia Procedure Notes (Signed)
Procedure Name: Intubation Date/Time: 02/13/2020 3:25 PM Performed by: Mariea Clonts, CRNA Pre-anesthesia Checklist: Patient identified, Emergency Drugs available, Suction available and Patient being monitored Patient Re-evaluated:Patient Re-evaluated prior to induction Oxygen Delivery Method: Circle System Utilized Preoxygenation: Pre-oxygenation with 100% oxygen Induction Type: IV induction Ventilation: Mask ventilation without difficulty Laryngoscope Size: Miller and 2 Grade View: Grade I Tube type: Oral Tube size: 7.0 mm Number of attempts: 1 Airway Equipment and Method: Stylet and Oral airway Placement Confirmation: ETT inserted through vocal cords under direct vision,  positive ETCO2 and breath sounds checked- equal and bilateral Tube secured with: Tape Dental Injury: Teeth and Oropharynx as per pre-operative assessment

## 2020-02-13 NOTE — Progress Notes (Signed)
[facility] night shift.  The staff reports a blood pressure 187/86 mmHg with a pulse of 98 bpm.  No associated symptoms.  Staff is requesting as needed antihypertensive.  Metoprolol 2.5 mg IVP every 6 hours as needed ordered.  Tennis Must, MD

## 2020-02-13 NOTE — Plan of Care (Signed)

## 2020-02-13 NOTE — Brief Op Note (Signed)
02/11/2020 - 02/13/2020  4:36 PM  PATIENT:  [name]  79 y.o. female  PRE-OPERATIVE DIAGNOSIS:  RIGHT HIP FRACTURE  POST-OPERATIVE DIAGNOSIS:  RIGHT HIP FRACTURE  PROCEDURE:  Procedure(s): TOTAL HIP ARTHROPLASTY ANTERIOR APPROACH (Right)  SURGEON:  Surgeon(s) and Role:    Mcarthur Rossetti, MD - Primary  ANESTHESIA:   general  EBL:  200 mL   COUNTS:  YES  DICTATION: .Other Dictation: Dictation Number [id number]  PLAN OF CARE: Admit to inpatient   PATIENT DISPOSITION:  PACU - hemodynamically stable.   Delay start of Pharmacological VTE agent (>24hrs) due to surgical blood loss or risk of bleeding: no

## 2020-02-13 NOTE — Progress Notes (Signed)
ANTICOAGULATION CONSULT NOTE - Initial Consult  Pharmacy Consult for Restarting DOAC (Apixaban) on POD #1 if Hbg Stable (=/>7.5) Indication: atrial fibrillation  Allergies  Allergen Reactions  . Xanax [Alprazolam] Other (See Comments)    combative    Patient Measurements: Height: 5\' 5"  (165.1 cm) Weight: 47.6 kg (105 lb) IBW/kg (Calculated) : 57  Vital Signs: Temp: 100 F (37.8 C) (07/27 1657) Temp Source: Oral (07/27 0826) BP: 176/77 (07/27 0826) Pulse Rate: 109 (07/27 1657)  Labs: Recent Labs    02/11/20 1210 02/11/20 1210 02/12/20 0630 02/13/20 0651  HGB 10.8*   < > 9.1* 10.2*  HCT 34.2*  --  29.9* 32.7*  PLT 391  --  300 335  LABPROT 15.5*  --   --  14.1  INR 1.3*  --   --  1.1  CREATININE 1.08*  --  0.94 1.03*   < > = values in this interval not displayed.    Estimated Creatinine Clearance: 32.7 mL/min (A) (by C-G formula based on SCr of 1.03 mg/dL (H)).   Medical History: Past Medical History:  Diagnosis Date  . Anxiety   . Arthritis   . Asthma   . COPD (chronic obstructive pulmonary disease) ([place])   . HOH (hard of hearing)   . Hypertension   . Wears dentures    top and partial bottom  . Wears glasses     Assessment: 79 yr old female admitted on 02/11/20 with hip fx after fall. Pt has hx of atrial fibrillation, for which she was receiving apixaban 2.5 mg PO BID PTA. Apixaban was held on admission, anticipating hip arthroplasty, which pt had this afternoon. Pharmacy is consulted to restart DOAC (apixaban) on post-op day #1 pending AM hemoglobin being stable and not requiring blood transfusion and Hgb =/>7.5.  H/H 10.2/32.7, platelets 335; INR 1.1; Scr 1.03, 79 yrs old, wt 47.6 kg  Goal of Therapy:  Prevention of stroke secondary to atrial fibrillation Monitor platelets by anticoagulation protocol: Yes   Plan:  Resume home apixaban dose of 2.5 mg PO BID tomorrow morning (POD #1) if Hgb =/>7.5 Monitor CBC, renal function Monitor for signs/symptoms  of bleeding  Gillermina Hu, PharmD, BCPS, [facility] Clinical Pharmacist 02/13/2020,5:24 PM

## 2020-02-13 NOTE — Progress Notes (Signed)
This encounter was created in error - please disregard.

## 2020-02-13 NOTE — Progress Notes (Addendum)
PROGRESS NOTE  Bianca [name]:[id number] DOB: 05/28/1941 DOA: 02/11/2020 PCP: Bianca Post, PA-C  HPI/Recap of past 24 hours: HPI: Bianca [name] is a 79 y.o. female with medical history significant of hypertension, paroxysmal atrial fibrillation on Eliquis, orthostatic hypotension on Midodrine, COPD, anxiety, history of breast cancer status postmastectomy, osteoporosis, and tobacco abuse who presented to [facility] ED after having a fall.  Reports she was on the deck and the next thing she remembered was waking up on the ground.  After talks with her daughter over the phone the event was partly witnessed and they believe that she tripped off the deck which is not that raised off the ground.  Patient did hit her head on a blue plastic chair and then landed on the ground.  There was no reports of any loss of consciousness.  She complained of having severe pain of the right leg and was unable to ambulate.  Patient notes that she has had a persistent cough for the last 3 to 4 weeks and has had weight loss of at least 10 pounds over the last several months.  Due to her persistent cough she had recently had a chest x-ray which showed a left lung mass.  Her primary care provider had scheduled her for outpatient CT scan in the future.  She admits that she has smoked since the age of 48 on average of 1/4 ppd.  ED Course: Chest x-ray noted a small left pleural effusion with a left lower lobe lung mass.  Pelvic x-ray revealed a right femoral neck fracture.  02/13/20: Seen and examined.  Alert and oriented x3.  She has no new complaint this morning. Plan for right hip repair today by Bianca [name].    Assessment/Plan: Principal Problem:   Femoral neck fracture (HCC) Active Problems:   HYPERTENSION, BENIGN ESSENTIAL   COPD (chronic obstructive pulmonary disease) (HCC)   Breast cancer of upper-outer quadrant of left female breast (HCC)   Tobacco abuse   AF (paroxysmal atrial fibrillation)  (HCC)  Right femoral neck fracture secondary to fall, planned repair on 02/13/20 by Bianca [name]:  Found to have a femoral neck fracture.  Orthopedics formally consulted.   Family requested Dr. Ninfa Linden  Pain control and bowel regimen in place. [name] fall CT head and neck no acute intracranial findings. No acute cervical spine fractures.  Left lower lung mass:  Patient had been complaining of a cough over the last 3 to 4 weeks and recent chest x-ray showed a left lower lung mass.  She had been scheduled to have a CT scan of the chest by her PCP in the outpatient setting.   Suspecting likely malignant in the setting of chronic tobacco use, chronic cough, weight loss, and CT evidence of a Left lower lobe mass. Continue to monitor Continue Normal saline IV fluids at 50 mL/h -Will need to determine best possible way to get a tissue sample and therefore set up patient to have follow-up with oncology after her orthopedic surgery.  Hypertensive urgency with prior hx of chronic hypotention BP is not at goal Added Norvasc 5 mg daily Continue home losartan 12.5 mg daily Continue to hold off midodrine  Paroxysmal atrial fibrillation on chronic anticoagulation:  Currently in sinus rhythm Her last dose 02/10/20. -Holding Eliquis for planned ortho surgery 7/27  Osteoporosis: Patient had recently been seen by endocrinology on 12/26/2019.  DEXA scan at [facility] in [place] on 06/20/2018 showed T-scores of right femoral neck -4,  right total femur -3.4, left femoral neck -3.5, left total femur -3.5, AP total spine -1.9.  Follow up with your endocrinologist outpatient.  Tobacco abuse disorder, ongoing: Patient reports that she smoked for 1/4 pack of cigarettes a day on average for the last 64 years.  She had recently cut back and currently would like to quit and does not need a nicotine patch. -Continue to encourage cessation of tobacco use  History of breast cancer: Patient status [name]  resection back in 2014.  Severe malnutrition BMI 17  Muscle mass loss Continue oral supplement Encourage increase oral protein calorie intake  DVT prophylaxis: SCDs Code Status: Full Family Communication:  None at bedside.  Consults called: Orthopedic surgery   Status is: Inpatient    Dispo:  Patient From: Home  Planned Disposition: [place]  Expected discharge date: 02/15/20  Medically stable for discharge: No, R hip repair by ortho today.         Objective: Vitals:   02/13/20 0826 02/13/20 0829 02/13/20 1204 02/13/20 1304  BP: (!) 176/77     Pulse: 97  96   Resp: 17  16   Temp: 98.6 F (37 C)     TempSrc: Oral     SpO2: 100% 95% 96%   Weight:    47.6 kg  Height:    5\' 5"  (1.651 m)   No intake or output data in the 24 hours ending 02/13/20 1518 Filed Weights   02/11/20 1041 02/13/20 1304  Weight: 47.6 kg 47.6 kg    Exam:  . General: 79 y.o. year-old female pleasant frail-appearing no acute distress.  Alert oriented x3.   . Cardiovascular: Regular rate and rhythm no rubs or gallops.   Marland Kitchen Respiratory: Clear to auscultation no wheezes or rales. . Abdomen: Soft nontender normal bowel sounds present. . Musculoskeletal: No lower extremity edema bilaterally.   Marland Kitchen Psychiatry: Mood is appropriate for condition and setting.   Data Reviewed: CBC: Recent Labs  Lab 02/11/20 1210 02/12/20 0630 02/13/20 0651  WBC 8.8 8.7 10.9*  NEUTROABS 7.2  --   --   HGB 10.8* 9.1* 10.2*  HCT 34.2* 29.9* 32.7*  MCV 83.6 82.8 82.2  PLT 391 300 035   Basic Metabolic Panel: Recent Labs  Lab 02/11/20 1210 02/12/20 0630 02/13/20 0651  NA 137 141 136  K 5.1 3.4* 3.6  CL 108 113* 106  CO2 21* 19* 22  GLUCOSE 118* 99 106*  BUN 16 10 11   CREATININE 1.08* 0.94 1.03*  CALCIUM 8.3* 7.1* 8.4*  MG  --   --  1.9  PHOS  --   --  2.4*   GFR: Estimated Creatinine Clearance: 32.7 mL/min (A) (by C-G formula based on SCr of 1.03 mg/dL (H)). Liver Function  Tests: Recent Labs  Lab 02/13/20 0651  AST 22  ALT 13  ALKPHOS 64  BILITOT 0.9  PROT 6.2*  ALBUMIN 2.6*   No results for input(s): LIPASE, AMYLASE in the last 168 hours. No results for input(s): AMMONIA in the last 168 hours. Coagulation Profile: Recent Labs  Lab 02/11/20 1210 02/13/20 0651  INR 1.3* 1.1   Cardiac Enzymes: No results for input(s): CKTOTAL, CKMB, CKMBINDEX, TROPONINI in the last 168 hours. BNP (last 3 results) No results for input(s): PROBNP in the last 8760 hours. HbA1C: No results for input(s): HGBA1C in the last 72 hours. CBG: No results for input(s): GLUCAP in the last 168 hours. Lipid Profile: No results for input(s): CHOL, HDL, LDLCALC, TRIG,  CHOLHDL, LDLDIRECT in the last 72 hours. Thyroid Function Tests: No results for input(s): TSH, T4TOTAL, FREET4, T3FREE, THYROIDAB in the last 72 hours. Anemia Panel: No results for input(s): VITAMINB12, FOLATE, FERRITIN, TIBC, IRON, RETICCTPCT in the last 72 hours. Urine analysis:    Component Value Date/Time   COLORURINE YELLOW 02/12/2020 1430   APPEARANCEUR CLEAR 02/12/2020 1430   LABSPEC 1.023 02/12/2020 1430   PHURINE 6.0 02/12/2020 1430   GLUCOSEU NEGATIVE 02/12/2020 1430   GLUCOSEU NEGATIVE 09/13/2018 1419   HGBUR NEGATIVE 02/12/2020 1430   BILIRUBINUR NEGATIVE 02/12/2020 1430   BILIRUBINUR negative 01/08/2020 1610   KETONESUR NEGATIVE 02/12/2020 1430   PROTEINUR NEGATIVE 02/12/2020 1430   UROBILINOGEN 0.2 01/08/2020 1610   UROBILINOGEN 0.2 09/13/2018 1419   NITRITE NEGATIVE 02/12/2020 1430   LEUKOCYTESUR NEGATIVE 02/12/2020 1430   Sepsis Labs: @LABRCNTIP (procalcitonin:4,lacticidven:4)  ) Recent Results (from the past 240 hour(s))  SARS Coronavirus 2 by RT PCR (hospital order, performed in [place] hospital lab) Nasopharyngeal Nasopharyngeal Swab     Status: None   Collection Time: 02/11/20  2:19 PM   Specimen: Nasopharyngeal Swab  Result Value Ref Range Status   SARS Coronavirus 2  NEGATIVE NEGATIVE Final    Comment: (NOTE) SARS-CoV-2 target nucleic acids are NOT DETECTED.  The SARS-CoV-2 RNA is generally detectable in upper and lower respiratory specimens during the acute phase of infection. The lowest concentration of SARS-CoV-2 viral copies this assay can detect is 250 copies / mL. A negative result does not preclude SARS-CoV-2 infection and should not be used as the sole basis for treatment or other patient management decisions.  A negative result may occur with improper specimen collection / handling, submission of specimen other than nasopharyngeal swab, presence of viral mutation(s) within the areas targeted by this assay, and inadequate number of viral copies (<250 copies / mL). A negative result must be combined with clinical observations, patient history, and epidemiological information.  Fact Sheet for Patients:   StrictlyIdeas.no  Fact Sheet for Healthcare Providers: BankingDealers.co.za  This test is not yet approved or  cleared by the [place] FDA and has been authorized for detection and/or diagnosis of SARS-CoV-2 by FDA under an Emergency Use Authorization (EUA).  This EUA will remain in effect (meaning this test can be used) for the duration of the COVID-19 declaration under Section 564(b)(1) of the Act, 21 U.S.C. section 360bbb-3(b)(1), unless the authorization is terminated or revoked sooner.  Performed at [facility], [place] [address], [place], [place] [address]   Surgical pcr screen     Status: None   Collection Time: 02/12/20  9:37 PM   Specimen: Nasal Mucosa; Nasal Swab  Result Value Ref Range Status   MRSA, PCR NEGATIVE NEGATIVE Final   Staphylococcus aureus NEGATIVE NEGATIVE Final    Comment: (NOTE) The Xpert SA Assay (FDA approved for NASAL specimens in patients 25 years of age and older), is one component of a comprehensive surveillance program. It is not intended to  diagnose infection nor to guide or monitor treatment. Performed at [facility], [place] [address], [place], [place] [address]       Studies: No results found.  Scheduled Meds: . [MAR Hold] amLODipine  5 mg Oral Daily  . [MAR Hold] busPIRone  15 mg Oral BID  . [MAR Hold] feeding supplement (ENSURE ENLIVE)  237 mL Oral TID BM  . [MAR Hold] loratadine  10 mg Oral Daily  . [MAR Hold] losartan  12.5 mg Oral Daily  . [  MAR Hold] mirtazapine  15 mg Oral QHS  . [MAR Hold] mometasone-formoterol  2 puff Inhalation BID  . [MAR Hold] multivitamin with minerals  1 tablet Oral Daily  . [MAR Hold] nicotine  7 mg Transdermal Daily  . [MAR Hold] senna  1 tablet Oral BID    Continuous Infusions: . sodium chloride 50 mL/hr at 02/13/20 0620  . lactated ringers 10 mL/hr at 02/13/20 1316  . sodium phosphate  Dextrose 5% IVPB 10 mmol (02/13/20 0954)     LOS: 2 days     Kayleen Memos, MD [facility] Pager [phone]  If 7PM-7AM, please contact night-coverage [email or web address] Password [facility] 02/13/2020, 3:18 PM

## 2020-02-13 NOTE — Progress Notes (Signed)
Patient ID: Bianca [name], female   DOB: 09-Dec-1940, 79 y.o.   MRN: [id number] I have spoken to Ms. Anaya.  She understands that we will proceed to surgery later today to address her right hip fracture.  The risk and benefits of surgery have been explained in detail and informed consent is obtained.  She has been n.p.o.

## 2020-02-14 DIAGNOSIS — E43 Unspecified severe protein-calorie malnutrition: Secondary | ICD-10-CM | POA: Insufficient documentation

## 2020-02-14 LAB — BASIC METABOLIC PANEL
Anion gap: 11 (ref 5–15)
BUN: 13 mg/dL (ref 8–23)
CO2: 20 mmol/L — ABNORMAL LOW (ref 22–32)
Calcium: 7.7 mg/dL — ABNORMAL LOW (ref 8.9–10.3)
Chloride: 106 mmol/L (ref 98–111)
Creatinine, Ser: 1.11 mg/dL — ABNORMAL HIGH (ref 0.44–1.00)
GFR calc Af Amer: 54 mL/min — ABNORMAL LOW (ref >60)
GFR calc non Af Amer: 47 mL/min — ABNORMAL LOW (ref >60)
Glucose, Bld: 199 mg/dL — ABNORMAL HIGH (ref 70–99)
Potassium: 3.8 mmol/L (ref 3.5–5.1)
Sodium: 137 mmol/L (ref 135–145)

## 2020-02-14 LAB — CBC
HCT: 29.3 % — ABNORMAL LOW (ref 36.0–46.0)
Hemoglobin: 9 g/dL — ABNORMAL LOW (ref 12.0–15.0)
MCH: 25.4 pg — ABNORMAL LOW (ref 26.0–34.0)
MCHC: 30.7 g/dL (ref 30.0–36.0)
MCV: 82.5 fL (ref 80.0–100.0)
Platelets: 310 10*3/uL (ref 150–400)
RBC: 3.55 MIL/uL — ABNORMAL LOW (ref 3.87–5.11)
RDW: 15.1 % (ref 11.5–15.5)
WBC: 10.5 10*3/uL (ref 4.0–10.5)
nRBC: 0 % (ref 0.0–0.2)

## 2020-02-14 LAB — GLUCOSE, CAPILLARY: Glucose-Capillary: 133 mg/dL — ABNORMAL HIGH (ref 70–99)

## 2020-02-14 MED ORDER — MIDODRINE HCL 5 MG PO TABS: 5.0000 mg | ORAL_TABLET | Freq: Four times a day (QID) | ORAL | Status: DC | PRN

## 2020-02-14 MED ORDER — APIXABAN 2.5 MG PO TABS
2.5000 mg | ORAL_TABLET | Freq: Two times a day (BID) | ORAL | Status: DC
  Administered 2020-02-14 – 2020-02-23 (×19): 2.5 mg via ORAL
  Filled 2020-02-14 (×20): qty 1

## 2020-02-14 NOTE — Anesthesia Postprocedure Evaluation (Signed)
Anesthesia Post Note  Patient: Bianca [name]  Procedure(s) Performed: TOTAL HIP ARTHROPLASTY ANTERIOR APPROACH (Right Hip)     Patient location during evaluation: PACU Anesthesia Type: General Level of consciousness: awake and alert Pain management: pain level controlled Vital Signs Assessment: post-procedure vital signs reviewed and stable Respiratory status: spontaneous breathing, nonlabored ventilation, respiratory function stable and patient connected to nasal cannula oxygen Cardiovascular status: blood pressure returned to baseline and stable Postop Assessment: no apparent nausea or vomiting Anesthetic complications: no   No complications documented.  Last Vitals:  Vitals:   02/14/20 0900 02/14/20 0901  BP: (!) 150/76 (!) 146/72  Pulse: 89 94  Resp: 18 18  Temp:  36.9 C  SpO2: 95%     Last Pain:  Vitals:   02/14/20 0901  TempSrc: Axillary  PainSc:                  Jamekia Gannett S

## 2020-02-14 NOTE — Progress Notes (Signed)
PROGRESS NOTE    Bianca [name]  LFY:[id number] DOB: 05-23-1941 DOA: 02/11/2020 PCP: Trinna Post, PA-C  Brief Narrative:  79 year old white female Continued tobacco abuse COPD on inhalers Permanent A. fib CHADS2 score >4 on Eliquis as well as flecainide Orthostatic hypotension on midodrine CKD stage II-III Worsening protein energy malnutrition  Admitted 02/11/2020 after fall tripping off the deck no LOC Recent weight loss noted by family-chest x-ray showed lung mass Orthopedics consulted and had total hip anterior arthroplasty 7/27   Assessment & Plan:   Principal Problem:   Femoral neck fracture (HCC) Active Problems:   HYPERTENSION, BENIGN ESSENTIAL   COPD (chronic obstructive pulmonary disease) ([facility])   Breast cancer of upper-outer quadrant of left female breast (HCC)   Tobacco abuse   AF (paroxysmal atrial fibrillation) (HCC)   Protein-calorie malnutrition, severe   1. Hip fracture a. Defer management to Ortho re: post-op management, WBAT currently and resumed Eliquis 2.5 twice daily b. Pain management Norco Vicodin and morphine in graded fashion 2. Permanent A. Fib  a. Continue Cozaar 12.5-previously apparently was on flecainide but is not on this now? b. Continue Eliquis 2.5 twice daily 3. Mild delirium a. Daughter reports this happens occasionally while the patient is hospitalized b. We will try to coordinate and ensure that she does not need sitter etc. I think this is mild and only c. At baseline she is somewhat more functional and I think based on PT recommendations will need skilled care placement within the next 24 to 48 hours d. I will CC transitions 4. Orthostatic hypotension a. Supposed to continue midodrine which we will resume today 5. Protein energy malnutrition a. BMI 17-supplements as per dietary 6. Lung mass a. Supposed to have work-up for the same with PCP b. Is being apparently referred to [facility] c. We will CC the navigator for 7. Continued  tobacco use with COPD a. Unlikely will quit smoking b. Will need further follow-up in the outpatient setting as above 8.   DVT prophylaxis:Eliquis Code Status: This and full code Family Communication:  Discussed with the daughter at the bedsid disposition:   Status is: Inpatient  Remains inpatient appropriate because:Unsafe d/c plan and IV treatments appropriate due to intensity of illness or inability to take PO   Dispo:  Patient From: Home  Planned Disposition: [place]  Expected discharge date: 7/31 2021 orthopedics  Medically stable for discharge: No        Consultants:   Orthopedics  Procedures: Anterior hip repair  Antimicrobials: None   Subjective: Awake alert no distress EOMI NCAT but seems to be in mittens currently Pleasantly confused at times but orients quickly Ate at least half of her lunch  Objective: Vitals:   02/13/20 2115 02/14/20 0805 02/14/20 0900 02/14/20 0901  BP:   (!) 150/76 (!) 146/72  Pulse:   89 94  Resp:   18 18  Temp:    98.5 F (36.9 C)  TempSrc:   Axillary Axillary  SpO2: 99% 98% 95%   Weight:      Height:        Intake/Output Summary (Last 24 hours) at 02/14/2020 1432 Last data filed at 02/14/2020 1031 Gross per 24 hour  Intake 750 ml  Output 700 ml  Net 50 ml   Filed Weights   02/11/20 1041 02/13/20 1304  Weight: 47.6 kg 47.6 kg    Examination:  General exam: EOMI NCAT no focal deficit Respiratory system: Clear no added sound no rales no rhonchi  Cardiovascular system: S1-S2 no murmur rub or gallop Gastrointestinal system: Soft nontender no rebound no guarding. Central nervous system: Intact no focal deficit Extremities: No lower extremity edema no rash ROM intact Skin: Intact Psychiatry: Euthymic but slight confusion  Data Reviewed: I have personally reviewed following labs and imaging studies CO2 20 BUN/creatinine 13/1.11 Hemoglobin 9.0 platelet 310 Glucose 106-1 99  Radiology  Studies: Pelvis Portable  Result Date: 02/13/2020 CLINICAL DATA:  Post right hip arthroplasty. EXAM: PORTABLE PELVIS 1-2 VIEWS COMPARISON:  Preoperative radiographs FINDINGS: Right hip arthroplasty in expected alignment. No periprosthetic lucency or fracture. Recent postsurgical change includes air and edema in the joint space and soft tissues. Lateral skin staples. Increased density in the urinary bladder suggestive of excretion of IV contrast from prior CT. IMPRESSION: Right hip arthroplasty without immediate postoperative complication. Electronically Signed   By: Keith Rake M.D.   On: 02/13/2020 18:21   DG C-Arm 1-60 Min  Result Date: 02/13/2020 CLINICAL DATA:  Right hip replacement. EXAM: DG C-ARM 1-60 MIN; OPERATIVE RIGHT HIP WITH PELVIS FLUOROSCOPY TIME:  Fluoroscopy Time:  18 seconds Radiation Exposure Index (if provided by the fluoroscopic device): 1.375 mGy Number of Acquired Spot Images: 3 COMPARISON:  Preoperative radiograph 02/11/2020 FINDINGS: Three fluoroscopic spot images obtained in the operating room. Interval right hip arthroplasty. Fluoroscopy time 18 seconds. Dose 1.375 mGy. IMPRESSION: Fluoroscopic spot views after right hip arthroplasty. Electronically Signed   By: Keith Rake M.D.   On: 02/13/2020 18:20   DG HIP OPERATIVE UNILAT W OR W/O PELVIS RIGHT  Result Date: 02/13/2020 CLINICAL DATA:  Right hip replacement. EXAM: DG C-ARM 1-60 MIN; OPERATIVE RIGHT HIP WITH PELVIS FLUOROSCOPY TIME:  Fluoroscopy Time:  18 seconds Radiation Exposure Index (if provided by the fluoroscopic device): 1.375 mGy Number of Acquired Spot Images: 3 COMPARISON:  Preoperative radiograph 02/11/2020 FINDINGS: Three fluoroscopic spot images obtained in the operating room. Interval right hip arthroplasty. Fluoroscopy time 18 seconds. Dose 1.375 mGy. IMPRESSION: Fluoroscopic spot views after right hip arthroplasty. Electronically Signed   By: Keith Rake M.D.   On: 02/13/2020 18:20      Scheduled Meds: . amLODipine  5 mg Oral Daily  . apixaban  2.5 mg Oral BID  . busPIRone  15 mg Oral BID  . docusate sodium  100 mg Oral BID  . feeding supplement (ENSURE ENLIVE)  237 mL Oral TID BM  . losartan  12.5 mg Oral Daily  . mirtazapine  15 mg Oral QHS  . mometasone-formoterol  2 puff Inhalation BID  . multivitamin with minerals  1 tablet Oral Daily  . nicotine  7 mg Transdermal Daily  . senna  1 tablet Oral BID   Continuous Infusions: . sodium chloride 50 mL/hr at 02/13/20 0620     LOS: 3 days    Time spent: 74  Nita Sells, MD [facility] To contact the attending provider between 7A-7P or the covering provider during after hours 7P-7A, please log into the web site [email or web address] and access using universal  password for that web site. If you do not have the password, please call the hospital operator.  02/14/2020, 2:32 PM

## 2020-02-14 NOTE — Evaluation (Addendum)
Occupational Therapy Evaluation Patient Details Name: Bianca [name] MRN: [id number] DOB: Jan 27, 1941 Today's Date: 02/14/2020    History of Present Illness Pt is an 79 yr old female admitted on 02/11/20 with hip fx after fall tripping on deck hitting head on chair and requiring R THA direct anterior approach on 02/13/20. CT head negative. Age-indeterminate mild T3 superior endplate compression fracture. C-Spine: negative. CT abdmomen: LLL lung mass, trace pleural effusion. Of note, pt with cough x4 weeks and weight loss of 10# in a few months. PMHx:  atrial fibrillation, anxiety, COPD, arthritis, h/o breast CA s/p mastectomy.   Clinical Impression   Pt PTA: Pt living alone, but in the past year due to orthostatic hypotension, family has been staying with patient 24/7. Pt's daughter in room reports that pt was independent with ADL and mobility. Pt currently very lethargic. Pt following 1 step inconsistently. Pt maxA +2 overall for mobility, taking a few steps and ADL tasks maxA to totalA overall. Pt limited by severe lethargy, decreased strength, decreased ability to care for self, and increased pain. BP: 139/75 (92), 103 BPM lying;  130/67 985), 105 BPM sitting; Sitting after exertion 89/59 (69), 107 BPM. Pt would benefit from continued OT skilled services for ADL, mobility and safety. OT following acutely.     Follow Up Recommendations  SNF;Supervision/Assistance - 24 hour    Equipment Recommendations  Other (comment) (defer to next facility)    Recommendations for Other Services       Precautions / Restrictions Precautions Precautions: Fall Precaution Comments: x6 times in past 6 months Restrictions Weight Bearing Restrictions: Yes RLE Weight Bearing: Weight bearing as tolerated      Mobility Bed Mobility Overal bed mobility: Needs Assistance Bed Mobility: Supine to Sit;Sit to Supine     Supine to sit: Max assist;+2 for physical assistance Sit to supine: Max assist;+2 for  physical assistance   General bed mobility comments: Max A+2 for trunk and BLE management  Transfers Overall transfer level: Needs assistance Equipment used: Rolling walker (2 wheeled) Transfers: Sit to/from Stand Sit to Stand: Max assist;+2 physical assistance         General transfer comment: Pt using RW for mobility; pt unable to fully extend hips and knees- pt still using bed to lean on for stability with RW.    Balance Overall balance assessment: Needs assistance Sitting-balance support: Bilateral upper extremity supported Sitting balance-Leahy Scale: Poor Sitting balance - Comments: minA overall for support   Standing balance support: Bilateral upper extremity supported Standing balance-Leahy Scale: Poor Standing balance comment: poor stability                           ADL either performed or assessed with clinical judgement   ADL Overall ADL's : Needs assistance/impaired Eating/Feeding: Moderate assistance;Sitting;Bed level   Grooming: Moderate assistance;Sitting;Bed level   Upper Body Bathing: Moderate assistance;Sitting;Bed level   Lower Body Bathing: Total assistance;Sitting/lateral leans;Bed level   Upper Body Dressing : Moderate assistance;Sitting;Bed level   Lower Body Dressing: Total assistance;Sitting/lateral leans;Bed level   Toilet Transfer: Maximal assistance;+2 for physical assistance;+2 for safety/equipment;Stand-pivot;BSC;RW Toilet Transfer Details (indicate cue type and reason): multimodal cues; pt taking minimal steps with decreased step height to step toward [employer] Toileting- Clothing Manipulation and Hygiene: Total assistance       Functional mobility during ADLs: Maximal assistance;+2 for physical assistance;+2 for safety/equipment;Rolling walker;Cueing for safety General ADL Comments: Pt limited by severe lethargy, decreased strength, decreased ability to  care for self, and increased pain.     Vision Baseline Vision/History:  Wears glasses Wears Glasses: At all times Patient Visual Report: No change from baseline Vision Assessment?: No apparent visual deficits     Perception     Praxis      Pertinent Vitals/Pain Pain Assessment: Faces Faces Pain Scale: Hurts little more Pain Descriptors / Indicators: Discomfort;Guarding;Grimacing Pain Intervention(s): Monitored during session     Hand Dominance Right   Extremity/Trunk Assessment Upper Extremity Assessment Upper Extremity Assessment: Generalized weakness   Lower Extremity Assessment Lower Extremity Assessment: Generalized weakness;RLE deficits/detail RLE Deficits / Details: s/p R THA   Cervical / Trunk Assessment Cervical / Trunk Assessment: Kyphotic   Communication Communication Communication: Expressive difficulties   Cognition Arousal/Alertness: Lethargic Behavior During Therapy: WFL for tasks assessed/performed Overall Cognitive Status: Impaired/Different from baseline Area of Impairment: Problem solving;Awareness;Safety/judgement;Following commands;Memory;Orientation                 Orientation Level: Disoriented to;Place;Time;Situation   Memory: Decreased recall of precautions;Decreased short-term memory Following Commands: Follows one step commands inconsistently;Follows one step commands with increased time Safety/Judgement: Decreased awareness of safety;Decreased awareness of deficits Awareness: Intellectual Problem Solving: Slow processing;Decreased initiation;Difficulty sequencing;Requires verbal cues;Requires tactile cues General Comments: Pt very lethargic and requiring stimulation and movement to increase alertness. Pt following 1 step inconsistently. Pt required sedative last night so pt has been restful all day.   General Comments  BP: 139/75 (92), 103 BPM lying;  130/67 985), 105 BPM sitting; Sitting after exertion 89/59 (69), 107 BPM    Exercises     Shoulder Instructions      Home Living Family/patient expects  to be discharged to:: Private residence Living Arrangements: Children Available Help at Discharge: Family;Available 24 hours/day Type of Home: House Home Access: Stairs to enter [employer] of Steps: 1   Home Layout: One level     Bathroom Shower/Tub: Teacher, early years/pre: Handicapped height     Home Equipment: Bedside commode;Walker - 4 wheels;Toilet riser;Wheelchair - manual;Shower seat          Prior Functioning/Environment Level of Independence: Needs assistance  Gait / Transfers Assistance Needed: mobility with no AD ADL's / Homemaking Assistance Needed: Pt independent with ADL and IADLs provided for pt.    Comments: Family has been staying with her 24/7        OT Problem List: Decreased strength;Impaired balance (sitting and/or standing);Decreased activity tolerance;Decreased safety awareness;Pain;Decreased knowledge of use of DME or AE;Decreased knowledge of precautions;Decreased cognition      OT Treatment/Interventions: Self-care/ADL training;Therapeutic exercise;Energy conservation;DME and/or AE instruction;Therapeutic activities;Patient/family education;Balance training    OT Goals(Current goals can be found in the care plan section) Acute Rehab OT Goals Patient Stated Goal: to go to SNF OT Goal Formulation: With patient Time For Goal Achievement: 02/27/20 Potential to Achieve Goals: Good ADL Goals Pt Will Perform Lower Body Dressing: with mod assist;sitting/lateral leans;sit to/from stand Pt Will Transfer to Toilet: with min assist;ambulating;bedside commode Additional ADL Goal #1: Pt will increase to minA overall for bed mobility.  OT Frequency: Min 2X/week   Barriers to D/C:            Co-evaluation              AM-PAC OT "6 Clicks" Daily Activity     Outcome Measure Help from another person eating meals?: A Lot Help from another person taking care of personal grooming?: A Lot Help from another person toileting, which  includes using  toliet, bedpan, or urinal?: Total Help from another person bathing (including washing, rinsing, drying)?: A Lot Help from another person to put on and taking off regular upper body clothing?: A Lot Help from another person to put on and taking off regular lower body clothing?: Total 6 Click Score: 10   End of Session Equipment Utilized During Treatment: Gait belt;Rolling walker Nurse Communication: Mobility status  Activity Tolerance: Patient tolerated treatment well Patient left: in bed;with bed alarm set;with family/visitor present;with call bell/phone within reach  OT Visit Diagnosis: Unsteadiness on feet (R26.81);Muscle weakness (generalized) (M62.81);Pain Pain - Right/Left: Right Pain - part of body: Hip                Time: 1300-1320 OT Time Calculation (min): 20 min Charges:  OT General Charges $OT Visit: 1 Visit OT Evaluation $OT Eval Moderate Complexity: 1 Mod  Jefferey Pica, OTR/L Acute Rehabilitation Services Pager: [phone] Office: [phone]  Kaylin Schellenberg C 02/14/2020, 4:18 PM

## 2020-02-14 NOTE — Progress Notes (Signed)
Patient ID: Bianca [name], female   DOB: [birth year], 79 y.o.   MRN: [id number] The patient tolerated her surgery yesterday afternoon with a right total hip arthroplasty.  According to nursing, she was quite confused throughout the evening.  She was given some hydrocodone earlier this morning and is now sleeping.  On examination, her leg lengths are equal.  Her right hip dressing shows some scant bloody drainage.  From an orthopedic standpoint, therapy can work with her her mobility with weightbearing as tolerated and no hip precautions.  I will continue to follow her while she is in the hospital.  She will likely need short-term skilled nursing following this hospitalization.  Her hemoglobin this morning is 9.0.  There is certainly a component of acute blood loss anemia from surgery but she did not really drop much from the day before.  Eliquis is to be resumed today.

## 2020-02-14 NOTE — Progress Notes (Signed)
Nutrition Follow-up  DOCUMENTATION CODES:   Underweight, Severe malnutrition in context of chronic illness  INTERVENTION:   -Continue MVI with minerals daily -Continue Ensure Enlive po TID, each supplement provides 350 kcal and 20 grams of protein  NUTRITION DIAGNOSIS:   Severe Malnutrition related to chronic illness (COPD) as evidenced by moderate fat depletion, severe fat depletion, moderate muscle depletion, severe muscle depletion.  Ongoing  GOAL:   Patient will meet greater than or equal to 90% of their needs  Progressing  MONITOR:   PO intake, Supplement acceptance, Labs, Weight trends, Skin, I & O's  REASON FOR ASSESSMENT:   Consult Assessment of nutrition requirement/status  ASSESSMENT:   Bianca [name] is a 79 y.o. female with medical history significant of hypertension, paroxysmal atrial fibrillation on Eliquis orthostatic hypotension, COPD, anxiety, history of breast cancer status postmastectomy, osteoporosis, and tobacco abuse who presents after having a fall.  7/27- s/p Right total hip arthroplasty through direct anterior approach.  Reviewed I/O's: +550 ml x 24 hours and +1.4 L since adission  Attempted to speak with pt via hospital room phone, however, no answer.   No meal completion data available at this time, however, poor pre-operatively. Per MAR< pt is consuming Ensure supplements.   Medications reviewed and include colace.   Pt awaiting therapy evaluations for discharge disposition; will likely require SNF placement.   Labs reviewed: Phos: 2.4.  Diet Order:   Diet Order            Diet regular Room service appropriate? Yes; Fluid consistency: Thin  Diet effective now                 EDUCATION NEEDS:   Education needs have been addressed  Skin:  Skin Assessment: Reviewed RN Assessment  Last BM:  02/11/20  Height:   Ht Readings from Last 1 Encounters:  02/13/20 5\' 5"  (1.651 m)    Weight:   Wt Readings from Last 1  Encounters:  02/13/20 47.6 kg    Ideal Body Weight:  56.8 kg  BMI:  Body mass index is 17.47 kg/m.  Estimated Nutritional Needs:   Kcal:  1500-1700  Protein:  70-85 grams  Fluid:  > 1.5 L    Loistine Chance, RD, LDN, [place] Registered Dietitian II Certified Diabetes Care and Education Specialist Please refer to [facility] for RD and/or RD on-call/weekend/after hours pager

## 2020-02-14 NOTE — Evaluation (Signed)
Physical Therapy Evaluation Patient Details Name: Bianca [name] MRN: [id number] DOB: [birth year] Today's Date: 02/14/2020   History of Present Illness  Pt is an 79 yr old female admitted on 02/11/20 with hip fx after fall tripping on deck hitting head on chair and requiring R THA direct anterior approach on 02/13/20. CT head negative. Age-indeterminate mild T3 superior endplate compression fracture. C-Spine: negative. CT abdmomen: LLL lung mass, trace pleural effusion. Of note, pt with cough x4 weeks and weight loss of 10# in a few months. PMHx:  atrial fibrillation, anxiety, COPD, arthritis, h/o breast CA s/p mastectomy.  Clinical Impression  Pt had initial PT visit with significant health issues complicating her THA recovery.  Pt has no precautions and is WBAT but has CA diagnosis, and SNF recommended to recover her independence and to give pt and family time to think about her tx options.  Follow acutely to progress gait and transfers with a path to a lower level of care as pt can tolerate and to manage her with safety.  Focus on standing tolerance, steps with an AD and standing balance control.    Follow Up Recommendations SNF    Equipment Recommendations  Rolling walker with 5" wheels    Recommendations for Other Services       Precautions / Restrictions Precautions Precautions: Fall Precaution Comments: x6 times in past 6 months Restrictions Weight Bearing Restrictions: Yes RLE Weight Bearing: Weight bearing as tolerated      Mobility  Bed Mobility Overal bed mobility: Needs Assistance Bed Mobility: Supine to Sit;Sit to Supine     Supine to sit: Max assist;+2 for physical assistance;+2 for safety/equipment Sit to supine: Max assist;+2 for physical assistance;+2 for safety/equipment      Transfers Overall transfer level: Needs assistance Equipment used: Rolling walker (2 wheeled);2 person hand held assist Transfers: Sit to/from Stand Sit to Stand: Max assist;+2 physical  assistance;+2 safety/equipment         General transfer comment: pt is leaning backward to use bed for support once up standing  Ambulation/Gait Ambulation/Gait assistance: Mod assist;Max assist;+2 physical assistance;+2 safety/equipment Gait Distance (Feet): 1 Feet Assistive device: Rolling walker (2 wheeled);2 person hand held assist Gait Pattern/deviations: Step-to pattern;Decreased stride length;Wide base of support;Decreased weight shift to right     General Gait Details: pt is avoiding WB on RLE and using bed for stability but with prompts and mod max assist can step one step  Stairs            Wheelchair Mobility    Modified Rankin (Stroke Patients Only)       Balance Overall balance assessment: History of Falls;Needs assistance Sitting-balance support: Feet supported;Bilateral upper extremity supported Sitting balance-Leahy Scale: Poor Sitting balance - Comments: assist to maintain sitting balance                                     Pertinent Vitals/Pain Pain Assessment: Faces Faces Pain Scale: Hurts little more Pain Location: R hip and leg Pain Descriptors / Indicators: Operative site guarding;Guarding;Grimacing Pain Intervention(s): Limited activity within patient's tolerance;Monitored during session;Premedicated before session;Repositioned    Home Living Family/patient expects to be discharged to:: Private residence Living Arrangements: Children Available Help at Discharge: Family;Available 24 hours/day Type of Home: House Home Access: Stairs to enter   [employer] of Steps: 1 Home Layout: One level Home Equipment: Bedside commode;Walker - 4 wheels;Toilet riser;Wheelchair - Brewing technologist  Prior Function Level of Independence: Needs assistance   Gait / Transfers Assistance Needed: did not prev need AD  ADL's / Homemaking Assistance Needed: Pt independent with ADL and IADLs provided for pt.   Comments: has  continual family support     Hand Dominance   Dominant Hand: Right    Extremity/Trunk Assessment   Upper Extremity Assessment Upper Extremity Assessment: Defer to OT evaluation    Lower Extremity Assessment Lower Extremity Assessment: Generalized weakness;RLE deficits/detail RLE Deficits / Details: s/p R THA    Cervical / Trunk Assessment Cervical / Trunk Assessment: Kyphotic  Communication   Communication: Expressive difficulties  Cognition Arousal/Alertness: Lethargic Behavior During Therapy: Flat affect Overall Cognitive Status: Impaired/Different from baseline Area of Impairment: Problem solving;Awareness;Safety/judgement;Following commands;Memory;Attention;Orientation                 Orientation Level: Place;Time;Situation Current Attention Level: Selective Memory: Decreased recall of precautions;Decreased short-term memory Following Commands: Follows one step commands inconsistently;Follows one step commands with increased time Safety/Judgement: Decreased awareness of safety;Decreased awareness of deficits Awareness: Intellectual Problem Solving: Slow processing;Decreased initiation;Difficulty sequencing;Requires verbal cues;Requires tactile cues General Comments: has been medicated but with mod assist can take a partial step      General Comments General comments (skin integrity, edema, etc.): BP: 139/75 (92), 103 BPM lying;  130/67 985), 105 BPM sitting; Sitting after exertion 89/59 (69), 107 BPM    Exercises     Assessment/Plan    PT Assessment Patient needs continued PT services  PT Problem List Decreased strength;Decreased range of motion;Decreased activity tolerance;Decreased balance;Decreased mobility;Decreased coordination;Decreased cognition;Decreased knowledge of use of DME;Decreased safety awareness;Decreased knowledge of precautions;Cardiopulmonary status limiting activity;Decreased skin integrity;Pain       PT Treatment Interventions DME  instruction;Gait training;Stair training;Functional mobility training;Therapeutic exercise;Therapeutic activities;Balance training;Neuromuscular re-education;Patient/family education    PT Goals (Current goals can be found in the Care Plan section)  Acute Rehab PT Goals Patient Stated Goal: to go to SNF PT Goal Formulation: With patient/family Time For Goal Achievement: 02/28/20 Potential to Achieve Goals: Good    Frequency 7X/week   Barriers to discharge Inaccessible home environment;Decreased caregiver support home in staired entrance and has been more independent    Co-evaluation PT/OT/SLP Co-Evaluation/Treatment: Yes Reason for Co-Treatment: For patient/therapist safety;To address functional/ADL transfers;Necessary to address cognition/behavior during functional activity PT goals addressed during session: Mobility/safety with mobility;Balance;Proper use of DME         AM-PAC PT "6 Clicks" Mobility  Outcome Measure Help needed turning from your back to your side while in a flat bed without using bedrails?: A Lot Help needed moving from lying on your back to sitting on the side of a flat bed without using bedrails?: A Lot Help needed moving to and from a bed to a chair (including a wheelchair)?: A Lot Help needed standing up from a chair using your arms (e.g., wheelchair or bedside chair)?: Total Help needed to walk in hospital room?: Total Help needed climbing 3-5 steps with a railing? : Total 6 Click Score: 9    End of Session Equipment Utilized During Treatment: Gait belt Activity Tolerance: Patient limited by pain;Treatment limited secondary to medical complications (Comment) Patient left: in bed;with call bell/phone within reach;with bed alarm set;with family/visitor present Nurse Communication: Mobility status PT Visit Diagnosis: Unsteadiness on feet (R26.81);Muscle weakness (generalized) (M62.81);Pain;Difficulty in walking, not elsewhere classified (R26.2) Pain -  Right/Left: Right Pain - part of body: Hip    Time: 1255-1320 PT Time Calculation (min) (ACUTE ONLY): 25 min  Charges:   PT Evaluation $PT Eval Moderate Complexity: 1 Mod         Ramond Dial 02/14/2020, 7:17 PM  Mee Hives, PT MS Acute Rehab Dept. Number: [place] and [place]

## 2020-02-14 NOTE — Plan of Care (Signed)

## 2020-02-14 NOTE — Discharge Instructions (Signed)

## 2020-02-14 NOTE — TOC CAGE-AID Note (Signed)
Transition of Care [facility]) - CAGE-AID Screening   Patient Details  Name: Bianca [name] MRN: [id number] Date of Birth: 06-03-1941  [facility] of [facility]) CM/SW Contact:    Emeterio Reeve, [place] Phone Number: 02/14/2020, 12:18 PM   Clinical Narrative:  CSW met with pt at bedside. CSW introduced self and explained her role at the hospital.  Pt denied alcohol use and substance use.   CAGE-AID Screening:    Have You Ever Felt You Ought to Cut Down on Your Drinking or Drug Use?: No Have People Annoyed You By Critizing Your Drinking Or Drug Use?: No Have You Felt Bad Or Guilty About Your Drinking Or Drug Use?: No Have You Ever Had a Drink or Used Drugs First Thing In The Morning to Steady Your Nerves or to Get Rid of a Hangover?: No CAGE-AID Score: 0  Substance Abuse Education Offered: Yes    Blima Ledger, [place] Social Worker [phone]

## 2020-02-15 ENCOUNTER — Encounter (HOSPITAL_COMMUNITY): Payer: Self-pay | Admitting: Orthopaedic Surgery

## 2020-02-15 LAB — GLUCOSE, CAPILLARY
Glucose-Capillary: 96 mg/dL (ref 70–99)
Glucose-Capillary: 108 mg/dL — ABNORMAL HIGH (ref 70–99)

## 2020-02-15 LAB — BASIC METABOLIC PANEL
Anion gap: 8 (ref 5–15)
BUN: 18 mg/dL (ref 8–23)
CO2: 22 mmol/L (ref 22–32)
Calcium: 8 mg/dL — ABNORMAL LOW (ref 8.9–10.3)
Chloride: 112 mmol/L — ABNORMAL HIGH (ref 98–111)
Creatinine, Ser: 1.11 mg/dL — ABNORMAL HIGH (ref 0.44–1.00)
GFR calc Af Amer: 54 mL/min — ABNORMAL LOW (ref >60)
GFR calc non Af Amer: 47 mL/min — ABNORMAL LOW (ref >60)
Glucose, Bld: 100 mg/dL — ABNORMAL HIGH (ref 70–99)
Potassium: 3.9 mmol/L (ref 3.5–5.1)
Sodium: 142 mmol/L (ref 135–145)

## 2020-02-15 LAB — CBC WITH DIFFERENTIAL/PLATELET
Abs Immature Granulocytes: 0.03 10*3/uL (ref 0.00–0.07)
Basophils Absolute: 0 10*3/uL (ref 0.0–0.1)
Basophils Relative: 0 %
Eosinophils Absolute: 0.1 10*3/uL (ref 0.0–0.5)
Eosinophils Relative: 1 %
HCT: 26.7 % — ABNORMAL LOW (ref 36.0–46.0)
Hemoglobin: 8.4 g/dL — ABNORMAL LOW (ref 12.0–15.0)
Immature Granulocytes: 0 %
Lymphocytes Relative: 20 %
Lymphs Abs: 2 10*3/uL (ref 0.7–4.0)
MCH: 25.8 pg — ABNORMAL LOW (ref 26.0–34.0)
MCHC: 31.5 g/dL (ref 30.0–36.0)
MCV: 82.2 fL (ref 80.0–100.0)
Monocytes Absolute: 1 10*3/uL (ref 0.1–1.0)
Monocytes Relative: 10 %
Neutro Abs: 6.8 10*3/uL (ref 1.7–7.7)
Neutrophils Relative %: 69 %
Platelets: 333 10*3/uL (ref 150–400)
RBC: 3.25 MIL/uL — ABNORMAL LOW (ref 3.87–5.11)
RDW: 15.3 % (ref 11.5–15.5)
WBC: 10 10*3/uL (ref 4.0–10.5)
nRBC: 0 % (ref 0.0–0.2)

## 2020-02-15 MED ORDER — HYDROCODONE-ACETAMINOPHEN 5-325 MG PO TABS
1.0000 | ORAL_TABLET | ORAL | Status: DC | PRN
  Filled 2020-02-15: qty 1

## 2020-02-15 MED ORDER — METOPROLOL SUCCINATE ER 25 MG PO TB24
12.5000 mg | ORAL_TABLET | Freq: Every day | ORAL | Status: DC
  Administered 2020-02-15 – 2020-02-18 (×4): 12.5 mg via ORAL
  Filled 2020-02-15 (×5): qty 1

## 2020-02-15 MED ORDER — MIDODRINE HCL 5 MG PO TABS
5.0000 mg | ORAL_TABLET | Freq: Two times a day (BID) | ORAL | Status: DC
  Administered 2020-02-15 – 2020-02-19 (×7): 5 mg via ORAL
  Filled 2020-02-15 (×7): qty 1

## 2020-02-15 MED ORDER — HYDROCODONE-ACETAMINOPHEN 5-325 MG PO TABS: 1.0000 | ORAL_TABLET | ORAL | 0 refills | Status: DC | PRN

## 2020-02-15 NOTE — Progress Notes (Signed)
Physical Therapy Treatment Patient Details Name: Bianca [name] MRN: [id number] DOB: 03-Nov-1940 Today's Date: 02/15/2020    History of Present Illness Pt is an 79 yr old female admitted on 02/11/20 with hip fx after fall tripping on deck hitting head on chair and requiring R THA direct anterior approach on 02/13/20. CT head negative. Age-indeterminate mild T3 superior endplate compression fracture. C-Spine: negative. CT abdmomen: LLL lung mass, trace pleural effusion. Of note, pt with cough x4 weeks and weight loss of 10# in a few months. PMHx:  atrial fibrillation, anxiety, COPD, arthritis, h/o breast CA s/p mastectomy.    PT Comments    Pt supine in bed on arrival soiled in urine.  Pt thought she had a purewick on and none present.  Pt required assistance to roll for pericare.  Pt is progressing as evident by increased gt distance.  She continues limited due to dizziness in standing with gt progression.  Plan for orthostatics next session.  Pt continues to benefit from skilled rehab at snf to improve strength and function before returning home.     Follow Up Recommendations  SNF     Equipment Recommendations  Rolling walker with 5" wheels    Recommendations for Other Services       Precautions / Restrictions Precautions Precautions: Fall Precaution Comments: x6 times in past 6 months Restrictions Weight Bearing Restrictions: Yes RLE Weight Bearing: Weight bearing as tolerated Other Position/Activity Restrictions: poor vision    Mobility  Bed Mobility Overal bed mobility: Needs Assistance Bed Mobility: Supine to Sit     Supine to sit: Mod assist;+2 for physical assistance     General bed mobility comments: Mod assistance to rise into sitting and advance hips to edge of bed.  Transfers Overall transfer level: Needs assistance Equipment used: Rolling walker (2 wheeled) Transfers: Sit to/from Stand Sit to Stand: Min assist         General transfer comment: Pt required  cues for hand placement to and from seated surface.  Pt performed from bed and from recliner.  Ambulation/Gait Ambulation/Gait assistance: Min assist;+2 safety/equipment Gait Distance (Feet): 8 Feet Assistive device: Rolling walker (2 wheeled) Gait Pattern/deviations: Step-to pattern;Decreased stride length;Wide base of support;Decreased weight shift to right     General Gait Details: Pt with improved ability to bear weight during gt training.  She was limited with gt distance due to dizziness.  She required close chair follow.   Stairs             Wheelchair Mobility    Modified Rankin (Stroke Patients Only)       Balance                                            Cognition Arousal/Alertness: Awake/alert Behavior During Therapy: Flat affect Overall Cognitive Status: Within Functional Limits for tasks assessed                                 General Comments: Pt more clear this pm but required multimodal cues for safety.  She can recall what happened but she is [facility] with poor vision.  Pt incontinent of urine as she thought purewick was in place but it was not.      Exercises Total Joint Exercises Ankle Circles/Pumps: AROM;Both;20 reps;Supine Quad Sets: AROM;Right;10 reps;Supine Heel  Slides: AAROM;Right;10 reps;Supine Hip ABduction/ADduction: AAROM;Right;10 reps;Supine    General Comments        Pertinent Vitals/Pain Pain Assessment: Faces Faces Pain Scale: Hurts even more Pain Location: R hip and leg Pain Descriptors / Indicators: Operative site guarding;Guarding;Grimacing Pain Intervention(s): Monitored during session;Repositioned    Home Living                      Prior Function            PT Goals (current goals can now be found in the care plan section) Acute Rehab PT Goals Patient Stated Goal: to go to SNF Potential to Achieve Goals: Good Progress towards PT goals: Progressing toward goals     Frequency    7X/week      PT Plan Current plan remains appropriate    Co-evaluation              AM-PAC PT "6 Clicks" Mobility   Outcome Measure  Help needed turning from your back to your side while in a flat bed without using bedrails?: A Lot Help needed moving from lying on your back to sitting on the side of a flat bed without using bedrails?: A Lot Help needed moving to and from a bed to a chair (including a wheelchair)?: A Lot Help needed standing up from a chair using your arms (e.g., wheelchair or bedside chair)?: Total Help needed to walk in hospital room?: Total Help needed climbing 3-5 steps with a railing? : Total 6 Click Score: 9    End of Session Equipment Utilized During Treatment: Gait belt Activity Tolerance: Patient limited by pain;Treatment limited secondary to medical complications (Comment) Patient left: in bed;with call bell/phone within reach;with bed alarm set;with family/visitor present Nurse Communication: Mobility status PT Visit Diagnosis: Unsteadiness on feet (R26.81);Muscle weakness (generalized) (M62.81);Pain;Difficulty in walking, not elsewhere classified (R26.2) Pain - Right/Left: Right     Time: 7619-5093 PT Time Calculation (min) (ACUTE ONLY): 29 min  Charges:  $Gait Training: 8-22 mins $Therapeutic Exercise: 8-22 mins                     Erasmo Leventhal , PTA Acute Rehabilitation Services Pager [phone] Office [phone]     Kennetha Pearman Eli Hose 02/15/2020, 5:26 PM

## 2020-02-15 NOTE — TOC Initial Note (Addendum)
Transition of Care [facility]) - Initial/Assessment Note    Patient Details  Name: Bianca [name] MRN: [id number] Date of Birth: [birth year]  Transition [facility]) CM/SW Contact:    Curlene Labrum, RN Phone Number: 02/15/2020, 1:11 PM  Clinical Narrative:                 Case management met with the patient and spoke with the daughter, Candice on the phone regarding transitions of care for SNF placement after Left hip arthroplasty.  The patient has rolling walker, wheelchair, bedchair and shower chair at home.  The patient and daughter were given choice in regards to [facility] placement and daughter would prefer [facility].  Patient has received bother Phyzer vaccines and would require new PASSR and insurance approval through [employer].  Will continue to follow for placement. Spoke with the daughter and the daughter would prefer the patient to go to 1. [facility] or 2. [facility].  Will complete a PASSR and SNF workup.  Expected Discharge Plan: Skilled Nursing Facility Barriers to Discharge: Insurance Authorization   Patient Goals and CMS Choice Patient states their goals for this hospitalization and ongoing recovery are:: Plans to transfer to a [place] is first choice      Expected Discharge Plan and Services Expected Discharge Plan: [place]   Discharge Planning Services: CM Consult Post Acute Care Choice: [place] Living arrangements for the past 2 months: Single Family Home                                      Prior Living Arrangements/Services Living arrangements for the past 2 months: Single Family Home Lives with:: Adult Children Patient language and need for interpreter reviewed:: Yes Do you feel safe going back to the place where you live?: No   needs SNF placement  Need for Family Participation in Patient Care: Yes (Comment) Care giver support system in place?: Yes (comment) Current home  services: DME Criminal Activity/Legal Involvement Pertinent to Current Situation/Hospitalization: No - Comment as needed  Activities of Daily Living Home Assistive Devices/Equipment: Walker (specify type) ADL Screening (condition at time of admission) Patient's cognitive ability adequate to safely complete daily activities?: No Is the patient deaf or have difficulty hearing?: Yes Does the patient have difficulty seeing, even when wearing glasses/contacts?: Yes Does the patient have difficulty concentrating, remembering, or making decisions?: Yes Patient able to express need for assistance with ADLs?: Yes Does the patient have difficulty dressing or bathing?: Yes Independently performs ADLs?: No Does the patient have difficulty walking or climbing stairs?: Yes Weakness of Legs: Both Weakness of Arms/Hands: Both  Permission Sought/Granted Permission sought to share information with : Case Manager Permission granted to share information with : Yes, Verbal Permission Granted     Permission granted to share info w AGENCY: SNF facility  Permission granted to share info w Relationship: daughter, Candice     Emotional Assessment Appearance:: Appears stated age Attitude/Demeanor/Rapport: Gracious Affect (typically observed): Accepting Orientation: : Oriented to Self, Oriented to Place, Oriented to  Time, Oriented to Situation Alcohol / Substance Use: Not Applicable Psych Involvement: No (comment)  Admission diagnosis:  Femoral neck fracture (HCC) [S72.009A] Hip fracture ([place]) [S72.009A] Lung mass [R91.8] Closed fracture of neck of right femur, initial encounter ([place]) [S72.001A] Patient Active Problem List   Diagnosis Date Noted  . Protein-calorie malnutrition, severe 02/14/2020  .  Hip fracture ([place]) 02/11/2020  . Femoral neck fracture ([place]) 02/11/2020  . Orthostasis 06/19/2019  . AF (paroxysmal atrial fibrillation) ([place]) 03/16/2019  . Mitral regurgitation 03/16/2019  . Elevated  troponin   . Delirium   . Atrial fibrillation with RVR ([place]) 01/21/2019  . Underweight due to inadequate caloric intake 12/06/2017  . Acute bronchitis with COPD ([place]) 03/29/2017  . Osteoporosis 07/02/2016  . Tobacco abuse 10/26/2013  . Breast cancer of upper-outer quadrant of left female breast ([place]) 03/06/2013  . COPD (chronic obstructive pulmonary disease) ([place]) 06/09/2012  . Adjustment disorder 11/25/2010  . CHRONIC KIDNEY DISEASE STAGE III (MODERATE) 11/13/2009  . Depression 05/04/2007  . Asthma, chronic, unspecified asthma severity, with acute exacerbation 05/03/2007  . HYPERTENSION, BENIGN ESSENTIAL 12/06/2006  . ALLERGIC  RHINITIS 12/06/2006   PCP:  Trinna Post, PA-C Pharmacy:   [place], [place] [address] [place] [address] Phone: [phone] Fax: [phone]     Social Determinants of Health (SDOH) Interventions    Readmission Risk Interventions No flowsheet data found.

## 2020-02-15 NOTE — Plan of Care (Signed)
  Problem: Nutrition: Goal: Adequate nutrition will be maintained Outcome: Progressing   Problem: Elimination: Goal: Will not experience complications related to urinary retention Outcome: Progressing   Problem: Pain Managment: Goal: General experience of comfort will improve Outcome: Progressing   Problem: Skin Integrity: Goal: Risk for impaired skin integrity will decrease Outcome: Progressing

## 2020-02-15 NOTE — Progress Notes (Addendum)
PROGRESS NOTE    Bianca [name]  FVC:[id number] DOB: Dec 12, 1940 DOA: 02/11/2020 PCP: Trinna Post, PA-C  Brief Narrative:  79 year old white female Continued tobacco abuse COPD on inhalers Permanent A. fib CHADS2 score >4 on Eliquis as well as flecainide Orthostatic hypotension on midodrine CKD stage II-III Worsening protein energy malnutrition  Admitted 02/11/2020 after fall tripping off the deck no LOC Recent weight loss noted by family-chest x-ray showed lung mass Orthopedics consulted and had total hip anterior arthroplasty 7/27   Assessment & Plan:   Principal Problem:   Femoral neck fracture (HCC) Active Problems:   HYPERTENSION, BENIGN ESSENTIAL   COPD (chronic obstructive pulmonary disease) ([place])   Breast cancer of upper-outer quadrant of left female breast (HCC)   Tobacco abuse   AF (paroxysmal atrial fibrillation) (HCC)   Protein-calorie malnutrition, severe   1. Hip fracture status post repair 7/27 Dr. Ninfa Linden anterior hip arthroplasty right-sided a. Defer management to Ortho re: post-op management, WBAT currently and resumed Eliquis 2.5 twice daily b. Pain management Norco Vicodin and morphine in graded fashion-trying to avoid opiates with propensity for confusion c. Therapy services = SNF dispo 2. Permanent A. Fib  a. Holding Cozaar-was on flecainide but is not on this now? b. As needed metoprolol will be changed to low-dose metoprolol XL for a.m. 7/30 c. Continue Eliquis 2.5 twice daily 3. CKD 2-3 a. I will discontinue Cozaar at this time given advanced age risk of azotemia b. Can continue amlodipine for hypertension 4.  mild delirium a. Delirium is much improved compared to 7/28 b. At baseline she is somewhat more functional and I think based on PT recommendations will need skilled care placement within the next 24 to 48 hours 5. Orthostatic hypotension a. Supposed to continue midodrine which we will resume today b. Blood pressures are improved from  7/28 6. Protein energy malnutrition a. BMI 17-supplements as per dietary 7. Lung mass a. Supposed to have work-up for the same with PCP b. Is being apparently referred to [facility] c. Did carbon copy [place] lung navigator to give family members options with regards to local referral for oncology services 8. Continued tobacco use with COPD a. Unlikely will quit smoking b. Will need further follow-up in the outpatient setting as above   DVT prophylaxis:Eliquis Code Status: This and full code Family Communication:  Discussed with the daughter at the bedside on 7/28 disposition:   Status is: Inpatient  Remains inpatient appropriate because:Unsafe d/c plan and IV treatments appropriate due to intensity of illness or inability to take PO   Dispo:  Patient From: Home  Planned Disposition: [place]  Expected discharge date: 7/31 2021 orthopedics  Medically stable for discharge: No    Consultants:   Orthopedics  Procedures: Anterior hip repair  Antimicrobials: None   Subjective: Out of mittens much more coherent slightly hard of hearing no distress Does not complain of pain at rest but having expected pain lower extremity where she had surgery Seems to have a poor appetite  Objective: Vitals:   02/15/20 0510 02/15/20 0802 02/15/20 0855 02/15/20 1600  BP: (!) 151/75 (!) 160/75  (!) 135/67  Pulse: 105 104  (!) 107  Resp: 18 18  18   Temp: 99.4 F (37.4 C) 98.2 F (36.8 C)  98.6 F (37 C)  TempSrc: Oral Oral  Oral  SpO2: 97% 95% 95% 97%  Weight:      Height:        Intake/Output Summary (Last 24 hours) at  02/15/2020 1710 Last data filed at 02/14/2020 1735 Gross per 24 hour  Intake 240 ml  Output --  Net 240 ml   Filed Weights   02/11/20 1041 02/13/20 1304  Weight: 47.6 kg 47.6 kg    Examination:  General exam: EOMI NCAT no focal deficit and much more coherent compared to yesterday Respiratory system: Clear no added sound no rales no  rhonchi Cardiovascular system: S1-S2 no murmur rub or gallop Gastrointestinal system: Soft nontender no rebound no guarding. Central nervous system: Intact no focal deficit Extremities: No edema Skin: Intact except where she had surgery Psychiatry: Euthymic less confused  Data Reviewed: I have personally reviewed following labs and imaging studies CO2 20 BUN/creatinine 13/1.11-->18/1.1 Hemoglobin 9.0-->8.4 platelet 310 Glucose 106-1 99  Radiology Studies: No results found.   Scheduled Meds: . amLODipine  5 mg Oral Daily  . apixaban  2.5 mg Oral BID  . busPIRone  15 mg Oral BID  . feeding supplement (ENSURE ENLIVE)  237 mL Oral TID BM  . losartan  12.5 mg Oral Daily  . mirtazapine  15 mg Oral QHS  . mometasone-formoterol  2 puff Inhalation BID  . multivitamin with minerals  1 tablet Oral Daily  . nicotine  7 mg Transdermal Daily  . senna  1 tablet Oral BID   Continuous Infusions: . sodium chloride 50 mL/hr at 02/15/20 1607     LOS: 4 days    Time spent: 25  Nita Sells, MD [facility] To contact the attending provider between 7A-7P or the covering provider during after hours 7P-7A, please log into the web site [email or web address] and access using universal [place] password for that web site. If you do not have the password, please call the hospital operator.  02/15/2020, 5:10 PM

## 2020-02-15 NOTE — NC FL2 (Signed)
[place] LEVEL OF CARE SCREENING TOOL     IDENTIFICATION  Patient Name: Bianca [name] Birthdate: 11/20/1940 Sex: female Admission Date (Current Location): 02/11/2020  [facility] and [place] Number:  Herbalist and Address:  [facility], [place] [address], [place], [place] [address]      Provider Number: [id number]  Attending Physician Name and Address:  Nita Sells, MD  Relative Name and Phone Number:  Waymond Cera - [phone]    Current Level of Care: Hospital Recommended Level of Care: [place] Prior Approval Number:    Date Approved/Denied:   PASRR Number: pending  Discharge Plan: SNF    Current Diagnoses: Patient Active Problem List   Diagnosis Date Noted   Protein-calorie malnutrition, severe 02/14/2020   Hip fracture ([place]) 02/11/2020   Femoral neck fracture ([place]) 02/11/2020   Orthostasis 06/19/2019   AF (paroxysmal atrial fibrillation) ([place]) 03/16/2019   Mitral regurgitation 03/16/2019   Elevated troponin    Delirium    Atrial fibrillation with RVR ([place]) 01/21/2019   Underweight due to inadequate caloric intake 12/06/2017   Acute bronchitis with COPD ([place]) 03/29/2017   Osteoporosis 07/02/2016   Tobacco abuse 10/26/2013   Breast cancer of upper-outer quadrant of left female breast ([place]) 03/06/2013   COPD (chronic obstructive pulmonary disease) ([place]) 06/09/2012   Adjustment disorder 11/25/2010   CHRONIC KIDNEY DISEASE STAGE III (MODERATE) 11/13/2009   Depression 05/04/2007   Asthma, chronic, unspecified asthma severity, with acute exacerbation 05/03/2007   HYPERTENSION, BENIGN ESSENTIAL 12/06/2006   ALLERGIC  RHINITIS 12/06/2006    Orientation RESPIRATION BLADDER Height & Weight     Self, Time, Place  Normal External catheter Weight: 47.6 kg Height:  5\' 5"  (165.1 cm)  BEHAVIORAL SYMPTOMS/MOOD NEUROLOGICAL BOWEL NUTRITION STATUS      Continent Diet (See  Discharge Summary)  AMBULATORY STATUS COMMUNICATION OF NEEDS Skin   Extensive Assist Verbally Skin abrasions, Surgical wounds (skin tear - bilateral arm, left leg)                       Personal Care Assistance Level of Assistance  Bathing, Dressing Bathing Assistance: Limited assistance Feeding assistance: Independent Dressing Assistance: Limited assistance     Functional Limitations Info  Sight, Hearing, Speech Sight Info: Adequate Hearing Info: Adequate Speech Info: Adequate    SPECIAL CARE FACTORS FREQUENCY  PT (By licensed PT), OT (By licensed OT)     PT Frequency: 5 x per week OT Frequency: 5 x per week            Contractures Contractures Info: Not present    Additional Factors Info  Code Status, Allergies, Psychotropic Code Status Info: Full code Allergies Info: Xanex Psychotropic Info: Buspar, Remeron         Current Medications (02/15/2020):  This is the current hospital active medication list Current Facility-Administered Medications  Medication Dose Route Frequency Provider Last Rate Last Admin   0.9 %  sodium chloride infusion   Intravenous Continuous Mcarthur Rossetti, MD 50 mL/hr at 02/13/20 0620 Rate Change at 02/13/20 0620   acetaminophen (TYLENOL) tablet 325-650 mg  325-650 mg Oral Q6H PRN Mcarthur Rossetti, MD       acetaminophen (TYLENOL) tablet 650 mg  650 mg Oral Q6H PRN Mcarthur Rossetti, MD   650 mg at 02/13/20 0946   albuterol (PROVENTIL) (2.5 MG/3ML) 0.083% nebulizer solution 3 mL  3 mL Inhalation Q4H PRN Mcarthur Rossetti, MD  3 mL at 02/13/20 1203   amLODipine (NORVASC) tablet 5 mg  5 mg Oral Daily Mcarthur Rossetti, MD   5 mg at 02/15/20 0914   apixaban (ELIQUIS) tablet 2.5 mg  2.5 mg Oral BID Nita Sells, MD   2.5 mg at 02/15/20 0914   busPIRone (BUSPAR) tablet 15 mg  15 mg Oral BID Mcarthur Rossetti, MD   15 mg at 02/15/20 0914   feeding supplement (ENSURE ENLIVE) (ENSURE ENLIVE)  liquid 237 mL  237 mL Oral TID BM Mcarthur Rossetti, MD   237 mL at 02/15/20 1503   HYDROcodone-acetaminophen (NORCO) 7.5-325 MG per tablet 1-2 tablet  1-2 tablet Oral Q4H PRN Mcarthur Rossetti, MD       HYDROcodone-acetaminophen (NORCO/VICODIN) 5-325 MG per tablet 1-2 tablet  1-2 tablet Oral Q4H PRN Mcarthur Rossetti, MD   2 tablet at 02/14/20 0106   losartan (COZAAR) tablet 12.5 mg  12.5 mg Oral Daily Mcarthur Rossetti, MD   12.5 mg at 02/15/20 1032   menthol-cetylpyridinium (CEPACOL) lozenge 3 mg  1 lozenge Oral PRN Mcarthur Rossetti, MD       Or   phenol (CHLORASEPTIC) mouth spray 1 spray  1 spray Mouth/Throat PRN Mcarthur Rossetti, MD       methocarbamol (ROBAXIN) tablet 500 mg  500 mg Oral Q6H PRN Mcarthur Rossetti, MD       metoprolol tartrate (LOPRESSOR) injection 2.5 mg  2.5 mg Intravenous Q6H PRN Mcarthur Rossetti, MD   2.5 mg at 02/13/20 0534   midodrine (PROAMATINE) tablet 5 mg  5 mg Oral Q6H PRN Nita Sells, MD       mirtazapine (REMERON) tablet 15 mg  15 mg Oral QHS Mcarthur Rossetti, MD   15 mg at 02/14/20 2355   mometasone-formoterol (DULERA) 100-5 MCG/ACT inhaler 2 puff  2 puff Inhalation BID Mcarthur Rossetti, MD   2 puff at 02/15/20 0855   morphine 2 MG/ML injection 0.5-1 mg  0.5-1 mg Intravenous Q2H PRN Mcarthur Rossetti, MD       multivitamin with minerals tablet 1 tablet  1 tablet Oral Daily Mcarthur Rossetti, MD   1 tablet at 02/15/20 0914   nicotine (NICODERM CQ - dosed in mg/24 hr) patch 7 mg  7 mg Transdermal Daily Mcarthur Rossetti, MD   7 mg at 02/15/20 0916   ondansetron (ZOFRAN) tablet 4 mg  4 mg Oral Q6H PRN Mcarthur Rossetti, MD       Or   ondansetron [facility]) injection 4 mg  4 mg Intravenous Q6H PRN Mcarthur Rossetti, MD       senna Donavan Burnet) tablet 8.6 mg  1 tablet Oral BID Mcarthur Rossetti, MD   8.6 mg at 02/15/20 6384     Discharge  Medications: Please see discharge summary for a list of discharge medications.  Relevant Imaging Results:  Relevant Lab Results:   Additional Information TX#[id number]  Curlene Labrum, RN

## 2020-02-15 NOTE — Progress Notes (Signed)
Subjective: 2 Days Post-Op Procedure(s) (LRB): TOTAL HIP ARTHROPLASTY ANTERIOR APPROACH (Right) Patient reports pain as moderate.  She does follow some commands appropriately for me this am.  Therapy notes are recommending short-term skilled nursing.  hgb 8.4 with stable vitals.  Objective: Vital signs in last 24 hours: Temp:  [98 F (36.7 C)-99.4 F (37.4 C)] 98.2 F (36.8 C) (07/29 0802) Pulse Rate:  [89-106] 104 (07/29 0802) Resp:  [18-19] 18 (07/29 0802) BP: (116-160)/(64-76) 160/75 (07/29 0802) SpO2:  [95 %-97 %] 95 % (07/29 0855)  Intake/Output from previous day: 07/28 0701 - 07/29 0700 In: 240 [P.O.:240] Out: 500 [Urine:500] Intake/Output this shift: No intake/output data recorded.  Recent Labs    02/13/20 0651 02/14/20 0349 02/15/20 0311  HGB 10.2* 9.0* 8.4*   Recent Labs    02/14/20 0349 02/15/20 0311  WBC 10.5 10.0  RBC 3.55* 3.25*  HCT 29.3* 26.7*  PLT 310 333   Recent Labs    02/14/20 0349 02/15/20 0311  NA 137 142  K 3.8 3.9  CL 106 112*  CO2 20* 22  BUN 13 18  CREATININE 1.11* 1.11*  GLUCOSE 199* 100*  CALCIUM 7.7* 8.0*   Recent Labs    02/13/20 0651  INR 1.1    Sensation intact distally Intact pulses distally Dorsiflexion/Plantar flexion intact Incision: dressing C/D/I   Assessment/Plan: 2 Days Post-Op Procedure(s) (LRB): TOTAL HIP ARTHROPLASTY ANTERIOR APPROACH (Right) Up with therapy Discharge to SNF when medically clear - ok from [place] 02/15/2020, 8:57 AM

## 2020-02-16 ENCOUNTER — Telehealth: Payer: Self-pay | Admitting: *Deleted

## 2020-02-16 ENCOUNTER — Encounter: Payer: Self-pay | Admitting: *Deleted

## 2020-02-16 LAB — CBC WITH DIFFERENTIAL/PLATELET
Abs Immature Granulocytes: 0.02 10*3/uL (ref 0.00–0.07)
Basophils Absolute: 0 10*3/uL (ref 0.0–0.1)
Basophils Relative: 1 %
Eosinophils Absolute: 0.3 10*3/uL (ref 0.0–0.5)
Eosinophils Relative: 4 %
HCT: 25.3 % — ABNORMAL LOW (ref 36.0–46.0)
Hemoglobin: 8 g/dL — ABNORMAL LOW (ref 12.0–15.0)
Immature Granulocytes: 0 %
Lymphocytes Relative: 20 %
Lymphs Abs: 1.5 10*3/uL (ref 0.7–4.0)
MCH: 25.9 pg — ABNORMAL LOW (ref 26.0–34.0)
MCHC: 31.6 g/dL (ref 30.0–36.0)
MCV: 81.9 fL (ref 80.0–100.0)
Monocytes Absolute: 0.8 10*3/uL (ref 0.1–1.0)
Monocytes Relative: 10 %
Neutro Abs: 5.1 10*3/uL (ref 1.7–7.7)
Neutrophils Relative %: 65 %
Platelets: 349 10*3/uL (ref 150–400)
RBC: 3.09 MIL/uL — ABNORMAL LOW (ref 3.87–5.11)
RDW: 15.1 % (ref 11.5–15.5)
WBC: 7.8 10*3/uL (ref 4.0–10.5)
nRBC: 0 % (ref 0.0–0.2)

## 2020-02-16 LAB — BASIC METABOLIC PANEL WITH GFR
Anion gap: 7 (ref 5–15)
BUN: 13 mg/dL (ref 8–23)
CO2: 22 mmol/L (ref 22–32)
Calcium: 7.9 mg/dL — ABNORMAL LOW (ref 8.9–10.3)
Chloride: 110 mmol/L (ref 98–111)
Creatinine, Ser: 1.04 mg/dL — ABNORMAL HIGH (ref 0.44–1.00)
GFR calc Af Amer: 59 mL/min — ABNORMAL LOW
GFR calc non Af Amer: 51 mL/min — ABNORMAL LOW
Glucose, Bld: 104 mg/dL — ABNORMAL HIGH (ref 70–99)
Potassium: 3.8 mmol/L (ref 3.5–5.1)
Sodium: 139 mmol/L (ref 135–145)

## 2020-02-16 LAB — GLUCOSE, CAPILLARY: Glucose-Capillary: 87 mg/dL (ref 70–99)

## 2020-02-16 NOTE — Telephone Encounter (Signed)
I received a message from the new patient coordinator that the patient's daughter would like to speak to someone about her mom. I called her.  She states she would like to go to [facility] for her cancer care.  Due to patient being in the hospital at this time, I updated the daughter to contact the hospital doctors with an update and request referral to [facility].  She verbalized understanding.

## 2020-02-16 NOTE — Progress Notes (Signed)
I updated referring physician of patient and daughters request to be seen at [facility] and to please make referral to their facility.

## 2020-02-16 NOTE — Progress Notes (Signed)
PROGRESS NOTE    Bianca [name]  EPP:[id number] DOB: 08-15-1940 DOA: 02/11/2020 PCP: Trinna Post, PA-C  Brief Narrative:  79 year old white female Continued tobacco abuse COPD on inhalers Permanent A. fib CHADS2 score >4 on Eliquis as well as flecainide Orthostatic hypotension on midodrine CKD stage II-III Worsening protein energy malnutrition Recent diagnosis lung cancer being referred to [facility]  Admitted 02/11/2020 after fall tripping off the deck no LOC Recent weight loss noted by family-chest x-ray showed lung mass Orthopedics consulted and had total hip anterior arthroplasty 7/27   Assessment & Plan:   Principal Problem:   Femoral neck fracture (HCC) Active Problems:   HYPERTENSION, BENIGN ESSENTIAL   COPD (chronic obstructive pulmonary disease) ([place])   Breast cancer of upper-outer quadrant of left female breast ([place])   Tobacco abuse   AF (paroxysmal atrial fibrillation) (HCC)   Protein-calorie malnutrition, severe   1. Hip fracture status post repair 7/27 Dr. Ninfa Linden anterior hip arthroplasty right-sided a. Defer management to Ortho re: post-op management, WBAT currently and resumed Eliquis 2.5 twice daily b. Pain management Norco Vicodin and morphine in graded fashion-trying to avoid opiates with propensity for confusion c. Therapy services = SNF dispo 2. Permanent A. Fib CHADS2 score >4 a. Holding Cozaar-was on flecainide but is not on this now? b. Currently low-dose metoprolol XL 12.5-- stop the as needed c. Continue Eliquis 2.5 twice daily 3. CKD 2-3 a. I will discontinue Cozaar at this time given advanced age risk of azotemia b. Can continue amlodipine for hypertension 4.  mild delirium a. Delirium is much improved compared to 7/28 b. At baseline she is somewhat more functional and I think based on PT recommendations will need skilled care placement within the next 24 to 48 hours 5. Orthostatic hypotension a. Continuing midodrine b. Blood pressures are  improved from 7/28 6. Protein energy malnutrition a. BMI 17-supplements as per dietary 7. Lung mass a. Supposed to have work-up for the same with PCP b. I we will get further work-up at [facility] [address] 8. Continued tobacco use with COPD a. Unlikely will quit smoking b. Will need further follow-up in the outpatient setting as above   DVT prophylaxis:Eliquis Code Status: This and full code Family Communication:  Discussed with the daughter at the bedside on 7/30 and updated on plan of care  disposition:   Status is: Inpatient  Remains inpatient appropriate because:Unsafe d/c plan and IV treatments appropriate due to intensity of illness or inability to take PO likely can discharge tomorrow to facility if they are able to take her   Dispo:  Patient From: Home  Planned Disposition: [place]  Expected discharge date: 7/31 2021 orthopedics  Medically stable for discharge: No    Consultants:   Orthopedics  Procedures: Anterior hip repair  Antimicrobials: None   Subjective: Coherent pleasant no distress pain is controlled Straight leg raise on the right side is within normal limits without too much discomfort She hardly is eating however lunch tray remains relatively untouched Daughters in the room and I had a full discussion with her  Objective: Vitals:   02/15/20 1933 02/15/20 2048 02/16/20 0423 02/16/20 0913  BP:  (!) 149/73 (!) 159/68   Pulse:  (!) 109 98   Resp:  18 20   Temp:  98.9 F (37.2 C)    TempSrc:  Oral    SpO2: 98% 99% 98% 97%  Weight:      Height:        Intake/Output Summary (Last 24  hours) at 02/16/2020 1344 Last data filed at 02/16/2020 1212 Gross per 24 hour  Intake --  Output 700 ml  Net -700 ml   Filed Weights   02/11/20 1041 02/13/20 1304  Weight: 47.6 kg 47.6 kg    Examination:  General exam: EOMI NCAT Pleasant Respiratory system: Clear no added sound no rales no rhonchi Cardiovascular system: K5-G2 holosystolic murmur  appreciated today Gastrointestinal system: Soft nontender no rebound no guarding no organomegaly slight distention Central nervous system: Intact no focal deficit Extremities: No edema Skin: Intact except where she had surgery Psychiatry: Euthymic less confused  Data Reviewed: I have personally reviewed following labs and imaging studies CO2 20 BUN/creatinine 13/1.11-->18/1.1-->13/1.0 Hemoglobin 9.0-->8.4-->8.0 platelet 310   Radiology Studies: No results found.   Scheduled Meds: . amLODipine  5 mg Oral Daily  . apixaban  2.5 mg Oral BID  . busPIRone  15 mg Oral BID  . feeding supplement (ENSURE ENLIVE)  237 mL Oral TID BM  . metoprolol succinate  12.5 mg Oral Daily  . midodrine  5 mg Oral BID WC  . mirtazapine  15 mg Oral QHS  . mometasone-formoterol  2 puff Inhalation BID  . multivitamin with minerals  1 tablet Oral Daily  . nicotine  7 mg Transdermal Daily  . senna  1 tablet Oral BID   Continuous Infusions:    LOS: 5 days    Time spent: 15  Nita Sells, MD [facility] To contact the attending provider between 7A-7P or the covering provider during after hours 7P-7A, please log into the web site [email or web address] and access using universal [place] password for that web site. If you do not have the password, please call the hospital operator.  02/16/2020, 1:44 PM

## 2020-02-16 NOTE — Progress Notes (Signed)
Physical Therapy Treatment Patient Details Name: Bianca [name] MRN: [id number] DOB: 1941-01-20 Today's Date: 02/16/2020    History of Present Illness Pt is an 79 yr old female admitted on 02/11/20 with hip fx after fall tripping on deck hitting head on chair and requiring R THA direct anterior approach on 02/13/20. CT head negative. Age-indeterminate mild T3 superior endplate compression fracture. C-Spine: negative. CT abdmomen: LLL lung mass, trace pleural effusion. Of note, pt with cough x4 weeks and weight loss of 10# in a few months. PMHx:  atrial fibrillation, anxiety, COPD, arthritis, h/o breast CA s/p mastectomy.    PT Comments    Pt supine in bed.  Performed movement to edge of bed unassisted but remains to require assistance for transfer and gt training.  Based on continued fatigue and decreased strength SNF remains appropriate at this time.  Will continue to follow patient acutely.    Follow Up Recommendations  SNF     Equipment Recommendations       Recommendations for Other Services       Precautions / Restrictions Precautions Precautions: Fall Precaution Comments: x6 times in past 6 months Restrictions Weight Bearing Restrictions: Yes RLE Weight Bearing: Weight bearing as tolerated Other Position/Activity Restrictions: poor vision    Mobility  Bed Mobility Overal bed mobility: Needs Assistance Bed Mobility: Supine to Sit     Supine to sit: Supervision     General bed mobility comments: Pt able to move to edge of bed unassisted with increased time and effort.  Transfers Overall transfer level: Needs assistance Equipment used: Rolling walker (2 wheeled) Transfers: Sit to/from Stand Sit to Stand: Min assist         General transfer comment: Pt continues to required cues for hand placement.  Poor eccentric load returning to seated surface.  Ambulation/Gait Ambulation/Gait assistance: Min assist;+2 safety/equipment Gait Distance (Feet): 35  Feet Assistive device: Rolling walker (2 wheeled) Gait Pattern/deviations: Step-to pattern;Decreased stride length;Wide base of support;Decreased weight shift to right     General Gait Details: Pt with improved ability to bear weight during gt training.  NO c/o dizziness but fatigued after 35 ft.  Pt required cues for upper trunk control and RW safety.  Close chair follow for safety.   Stairs             Wheelchair Mobility    Modified Rankin (Stroke Patients Only)       Balance Overall balance assessment: History of Falls;Needs assistance Sitting-balance support: Feet supported;Bilateral upper extremity supported Sitting balance-Leahy Scale: Poor Sitting balance - Comments: assist to maintain sitting balance   Standing balance support: Bilateral upper extremity supported Standing balance-Leahy Scale: Poor Standing balance comment: poor stability                            Cognition Arousal/Alertness: Awake/alert Behavior During Therapy: Flat affect Overall Cognitive Status: Within Functional Limits for tasks assessed                                 General Comments: Pt more clear this pm but required multimodal cues for safety.  She can recall what happened but she is [facility] with poor vision.      Exercises Total Joint Exercises Ankle Circles/Pumps: AROM;Both;20 reps;Supine Quad Sets: AROM;Right;10 reps;Supine Heel Slides: AAROM;Right;10 reps;Supine Hip ABduction/ADduction: AAROM;Right;10 reps;Supine    General Comments  Pertinent Vitals/Pain Pain Assessment: Faces Faces Pain Scale: Hurts a little bit Pain Location: R hip and leg Pain Descriptors / Indicators: Sore Pain Intervention(s): Monitored during session;Repositioned    Home Living                      Prior Function            PT Goals (current goals can now be found in the care plan section) Acute Rehab PT Goals Patient Stated Goal: to go to SNF PT  Goal Formulation: With patient/family Potential to Achieve Goals: Good Progress towards PT goals: Progressing toward goals    Frequency    7X/week      PT Plan Current plan remains appropriate    Co-evaluation              AM-PAC PT "6 Clicks" Mobility   Outcome Measure  Help needed turning from your back to your side while in a flat bed without using bedrails?: A Lot Help needed moving from lying on your back to sitting on the side of a flat bed without using bedrails?: A Lot Help needed moving to and from a bed to a chair (including a wheelchair)?: A Lot Help needed standing up from a chair using your arms (e.g., wheelchair or bedside chair)?: Total Help needed to walk in hospital room?: Total Help needed climbing 3-5 steps with a railing? : Total 6 Click Score: 9    End of Session Equipment Utilized During Treatment: Gait belt Activity Tolerance: Patient limited by pain;Treatment limited secondary to medical complications (Comment) Patient left: in bed;with call bell/phone within reach;with bed alarm set;with family/visitor present Nurse Communication: Mobility status PT Visit Diagnosis: Unsteadiness on feet (R26.81);Muscle weakness (generalized) (M62.81);Pain;Difficulty in walking, not elsewhere classified (R26.2) Pain - Right/Left: Right Pain - part of body: Hip     Time: 2706-2376 PT Time Calculation (min) (ACUTE ONLY): 28 min  Charges:  $Gait Training: 8-22 mins $Therapeutic Exercise: 8-22 mins                     Erasmo Leventhal , PTA Acute Rehabilitation Services Pager [phone] Office [phone] ]    Cristela Blue 02/16/2020, 4:25 PM

## 2020-02-17 ENCOUNTER — Encounter: Payer: Self-pay | Admitting: *Deleted

## 2020-02-17 ENCOUNTER — Encounter: Payer: Self-pay | Admitting: Physician Assistant

## 2020-02-17 LAB — VITAMIN D 25 HYDROXY (VIT D DEFICIENCY, FRACTURES): Vit D, 25-Hydroxy: 35.94 ng/mL (ref 30–100)

## 2020-02-17 MED ORDER — ADULT MULTIVITAMIN W/MINERALS CH
1.0000 | ORAL_TABLET | Freq: Every day | ORAL | Status: DC
  Administered 2020-02-18 – 2020-02-23 (×6): 1 via ORAL
  Filled 2020-02-17 (×6): qty 1

## 2020-02-17 MED ORDER — FERROUS GLUCONATE 324 (38 FE) MG PO TABS
324.0000 mg | ORAL_TABLET | Freq: Two times a day (BID) | ORAL | Status: DC
  Administered 2020-02-17 – 2020-02-23 (×13): 324 mg via ORAL
  Filled 2020-02-17 (×14): qty 1

## 2020-02-17 NOTE — Plan of Care (Signed)

## 2020-02-17 NOTE — Progress Notes (Addendum)
     Subjective: 4 Days Post-Op Procedure(s) (LRB): TOTAL HIP ARTHROPLASTY ANTERIOR APPROACH (Right) Awake, alert and oriented x 4. She is thin, good color. Patient reports pain as mild.    Objective:   VITALS:  Temp:  [98.2 F (36.8 C)-98.3 F (36.8 C)] 98.3 F (36.8 C) (07/31 0426) Pulse Rate:  [91-104] 91 (07/31 0426) Resp:  [14-18] 14 (07/31 0426) BP: (133-153)/(58-73) 148/73 (07/31 0426) SpO2:  [95 %-98 %] 96 % (07/31 0838)  Neurologically intact ABD soft Neurovascular intact Sensation intact distally Intact pulses distally Dorsiflexion/Plantar flexion intact Incision: dressing C/D/I and no drainage No cellulitis present Compartment soft   LABS Recent Labs    02/15/20 0311 02/16/20 1056  HGB 8.4* 8.0*  WBC 10.0 7.8  PLT 333 349   Recent Labs    02/15/20 0311 02/16/20 1056  NA 142 139  K 3.9 3.8  CL 112* 110  CO2 22 22  BUN 18 13  CREATININE 1.11* 1.04*  GLUCOSE 100* 104*   No results for input(s): LABPT, INR in the last 72 hours.   Assessment/Plan: 4 Days Post-Op Procedure(s) (LRB): TOTAL HIP ARTHROPLASTY ANTERIOR APPROACH (Right) Post op anemia, need continued observation, borderline for transfusion, start ferrous gluconate.   Advance diet Up with therapy D/C IV fluids Discharge to SNF  Husband and she are in 80's likely SNF till more independent.  Basil Dess 02/17/2020, 10:33 AMPatient ID: Bianca [name], female   DOB: 09-30-1940, 79 y.o.   MRN: [id number]

## 2020-02-17 NOTE — Progress Notes (Signed)
PROGRESS NOTE    Bianca [name]  QAS:[id number] DOB: Feb 10, 1941 DOA: 02/11/2020 PCP: Trinna Post, PA-C  Brief Narrative:  79 year old white female Continued tobacco abuse COPD on inhalers Permanent A. fib CHADS2 score >4 on Eliquis as well as flecainide Orthostatic hypotension on midodrine CKD stage II-III Worsening protein energy malnutrition Recent diagnosis lung cancer being referred to [facility]  Admitted 02/11/2020 after fall tripping off the deck no LOC Recent weight loss noted by family-chest x-ray showed lung mass Orthopedics consulted and had total hip anterior arthroplasty 7/27 She is stabilized and we are planning for discharge on 8/2  Assessment & Plan:   Principal Problem:   Femoral neck fracture ([place]) Active Problems:   HYPERTENSION, BENIGN ESSENTIAL   COPD (chronic obstructive pulmonary disease) ([place])   Breast cancer of upper-outer quadrant of left female breast ([place])   Tobacco abuse   AF (paroxysmal atrial fibrillation) (HCC)   Protein-calorie malnutrition, severe   1. Hip fracture status post repair 7/27 Dr. Ninfa Linden anterior hip arthroplasty right-sided a. Defer management to Ortho re: post-op management, WBAT currently and resumed Eliquis 2.5 twice daily b. Pain management Norco Vicodin and morphine in graded fashion-given confusion with pain meds c. Therapy services = SNF dispo 2. Permanent A. Fib CHADS2 score >4 a. Holding Cozaar-was on flecainide but is not on this now? b. Currently low-dose metoprolol XL 12.5-- stop the as needed c. Continue Eliquis 2.5 twice daily 3. CKD 2-3 a. ARB discontinued this admission b. Amlodipine 5 for hypertension in addition to Toprol-XL as above 4.  Mild delirium a. Delirium is much improved compared to 7/28 b. SNF appropriate on discharge c. Continue BuSpar 15 twice daily Remeron 15 at bedtime and watch for sedation confusion 5. Orthostatic hypotension a. Continuing midodrine b. Blood pressures are improved from  7/28 6. Protein energy malnutrition a. BMI 17-supplements as per dietary 7. Lung mass a. Supposed to have work-up for the same with PCP b. We will get further work-up at [facility] 8. Continued tobacco use with COPD a. Unlikely will quit smoking b. Will need further follow-up in the outpatient setting as above   DVT prophylaxis:Eliquis Code Status: This and full code Family Communication:  No family present today I had a long discussion with the daughter on 7/30  disposition:   Status is: Inpatient  Remains inpatient appropriate because:Unsafe d/c plan and IV treatments appropriate due to intensity of illness or inability to take PO likely can discharge to facility on Monday pending  PAS SR number   Dispo:  Patient From: Home  Planned Disposition: [place]  Expected discharge date: 7/31 2021 orthopedics  Medically stable for discharge: No    Consultants:   Orthopedics  Procedures: Anterior hip repair  Antimicrobials: None   Subjective: Doing well no distress Coherent Eating some no pain no diarrhea Objective: Vitals:   02/16/20 1935 02/16/20 2022 02/17/20 0426 02/17/20 0838  BP:  (!) 153/67 (!) 148/73   Pulse:  101 91   Resp:  14 14   Temp:  98.3 F (36.8 C) 98.3 F (36.8 C)   TempSrc:  Oral Oral   SpO2: 95% 97% 97% 96%  Weight:      Height:        Intake/Output Summary (Last 24 hours) at 02/17/2020 1054 Last data filed at 02/16/2020 1505 Gross per 24 hour  Intake --  Output 1300 ml  Net -1300 ml   Filed Weights   02/11/20 1041 02/13/20 1304  Weight: 47.6 kg  47.6 kg    Examination:  General exam: EOMI NCAT Pleasant Respiratory system: Chest is clear without added sounds Cardiovascular system: X7-W6 holosystolic murmur appreciated today on monitor she is in sinus/sinus tach so we discontinued this on 7/31 Gastrointestinal system: Soft nontender no rebound no guarding no organomegaly slight distention Central nervous system: Intact no  focal deficit Extremities: No edema Skin: Intact except where she had surgery Psychiatry: Euthymic less confused  Data Reviewed: I have personally reviewed following labs and imaging studies No labs today  Radiology Studies: No results found.   Scheduled Meds: . amLODipine  5 mg Oral Daily  . apixaban  2.5 mg Oral BID  . busPIRone  15 mg Oral BID  . feeding supplement (ENSURE ENLIVE)  237 mL Oral TID BM  . ferrous gluconate  324 mg Oral BID WC  . metoprolol succinate  12.5 mg Oral Daily  . midodrine  5 mg Oral BID WC  . mirtazapine  15 mg Oral QHS  . mometasone-formoterol  2 puff Inhalation BID  . multivitamin with minerals  1 tablet Oral Daily  . nicotine  7 mg Transdermal Daily  . senna  1 tablet Oral BID   Continuous Infusions:    LOS: 6 days    Time spent: 15  Nita Sells, MD [facility] To contact the attending provider between 7A-7P or the covering provider during after hours 7P-7A, please log into the web site [email or web address] and access using universal [place] password for that web site. If you do not have the password, please call the hospital operator.  02/17/2020, 10:54 AM

## 2020-02-17 NOTE — TOC Progression Note (Signed)
Transition of Care [facility]) - Progression Note    Patient Details  Name: Bianca [name] MRN: [id number] Date of Birth: 1941/01/22  Transition of Care [facility]) CM/SW [place], [place] Phone Number: 02/17/2020, 2:19 PM  Clinical Narrative:    CSW received insurance auth for SNF and ambulance.  Reached out to both SNF's for placements.  Admission's office closed on weekends.  Will reach out to facilities on Monday for possible placement.  SNF approval # [id number], Ambulance approval # [id number].  HTA phone number is [phone]. TOC Team will continue to assist with disposition planning.   Expected Discharge Plan: Skilled Nursing Facility Barriers to Discharge: Ship broker  Expected Discharge Plan and Services Expected Discharge Plan: [place]   Discharge Planning Services: CM Consult Post Acute Care Choice: [place] Living arrangements for the past 2 months: Single Family Home                                       Social Determinants of Health (SDOH) Interventions    Readmission Risk Interventions No flowsheet data found.

## 2020-02-17 NOTE — Progress Notes (Signed)
Physical Therapy Treatment Patient Details Name: Bianca [name] MRN: [id number] DOB: [birth year] Today's Date: 02/17/2020    History of Present Illness Pt is an 79 yr old female admitted on 02/11/20 with hip fx after fall tripping on deck hitting head on chair and requiring R THA direct anterior approach on 02/13/20. CT head negative. Age-indeterminate mild T3 superior endplate compression fracture. C-Spine: negative. CT abdmomen: LLL lung mass, trace pleural effusion. Of note, pt with cough x4 weeks and weight loss of 10# in a few months. PMHx:  atrial fibrillation, anxiety, COPD, arthritis, h/o breast CA s/p mastectomy.    PT Comments    Emphasis on THA exercise, transitions and gait stability and tolerance.  Suspected BP still dropping a bit, limiting gait distance.    Follow Up Recommendations  SNF     Equipment Recommendations  Rolling walker with 5" wheels    Recommendations for Other Services       Precautions / Restrictions Precautions Precautions: Fall Restrictions RLE Weight Bearing: Weight bearing as tolerated    Mobility  Bed Mobility Overal bed mobility: Needs Assistance Bed Mobility: Supine to Sit     Supine to sit: Min guard     General bed mobility comments: cues for technique and sequence, bridged to EOB and up without assist  Transfers Overall transfer level: Needs assistance Equipment used: Rolling walker (2 wheeled) Transfers: Sit to/from Stand Sit to Stand: Min assist         General transfer comment: cues to get to EOB/chair, hand placement, assist to come forward.  Ambulation/Gait Ambulation/Gait assistance: Min guard;+2 safety/equipment Gait Distance (Feet): 40 Feet (x2) Assistive device: Rolling walker (2 wheeled) Gait Pattern/deviations: Step-to pattern;Step-through pattern;Decreased step length - right;Decreased step length - left;Decreased stride length   Gait velocity interpretation: <1.31 ft/sec, indicative of household  ambulator General Gait Details: less antalgic, still short step length, but improved w/bearing R LE   Stairs             Wheelchair Mobility    Modified Rankin (Stroke Patients Only)       Balance   Sitting-balance support: Feet supported Sitting balance-Leahy Scale: Fair     Standing balance support: Bilateral upper extremity supported Standing balance-Leahy Scale: Poor Standing balance comment: reliant on AD                            Cognition Arousal/Alertness: Awake/alert Behavior During Therapy: WFL for tasks assessed/performed Overall Cognitive Status: Within Functional Limits for tasks assessed                   Orientation Level: Situation Current Attention Level: Selective Memory: Decreased recall of precautions;Decreased short-term memory Following Commands: Follows one step commands with increased time Safety/Judgement: Decreased awareness of safety;Decreased awareness of deficits Awareness: Intellectual Problem Solving: Slow processing        Exercises Total Joint Exercises Ankle Circles/Pumps: AROM;Both;Supine;10 reps Quad Sets: AROM;Right;10 reps;Supine Heel Slides: AAROM;Right;10 reps;Supine;AROM Hip ABduction/ADduction: AAROM;Right;10 reps;Supine Other Exercises Other Exercises: bicep/tricep presses with graded resistance.    General Comments General comments (skin integrity, edema, etc.): During gait pt appear to have low BP end of 1st trial, but no s/s or low BP on return      Pertinent Vitals/Pain Pain Assessment: Faces Faces Pain Scale: Hurts a little bit Pain Location: R hip and leg Pain Descriptors / Indicators: Sore Pain Intervention(s): Monitored during session    Home Living  Prior Function            PT Goals (current goals can now be found in the care plan section) Acute Rehab PT Goals Patient Stated Goal: to go to SNF PT Goal Formulation: With patient/family Time For  Goal Achievement: 02/28/20 Potential to Achieve Goals: Good Progress towards PT goals: Progressing toward goals    Frequency    7X/week      PT Plan Current plan remains appropriate    Co-evaluation              AM-PAC PT "6 Clicks" Mobility   Outcome Measure  Help needed turning from your back to your side while in a flat bed without using bedrails?: A Little Help needed moving from lying on your back to sitting on the side of a flat bed without using bedrails?: A Little Help needed moving to and from a bed to a chair (including a wheelchair)?: A Little Help needed standing up from a chair using your arms (e.g., wheelchair or bedside chair)?: A Little Help needed to walk in hospital room?: A Lot Help needed climbing 3-5 steps with a railing? : Total 6 Click Score: 15    End of Session   Activity Tolerance: Patient tolerated treatment well;Other (comment) (? lowered BP) Patient left: in chair;with call bell/phone within reach;with family/visitor present Nurse Communication: Mobility status PT Visit Diagnosis: Unsteadiness on feet (R26.81);Pain;Difficulty in walking, not elsewhere classified (R26.2);Other abnormalities of gait and mobility (R26.89) Pain - Right/Left: Right Pain - part of body: Hip     Time: 1431-1454 PT Time Calculation (min) (ACUTE ONLY): 23 min  Charges:  $Gait Training: 8-22 mins $Therapeutic Exercise: 8-22 mins                     02/17/2020  Bianca Carne., PT Acute Rehabilitation Services [phone]  (pager) [phone]  (office)   Bianca [name] 02/17/2020, 5:41 PM

## 2020-02-18 LAB — CBC WITH DIFFERENTIAL/PLATELET
Abs Immature Granulocytes: 0.03 10*3/uL (ref 0.00–0.07)
Basophils Absolute: 0 10*3/uL (ref 0.0–0.1)
Basophils Relative: 0 %
Eosinophils Absolute: 0.8 10*3/uL — ABNORMAL HIGH (ref 0.0–0.5)
Eosinophils Relative: 9 %
HCT: 27.7 % — ABNORMAL LOW (ref 36.0–46.0)
Hemoglobin: 8.5 g/dL — ABNORMAL LOW (ref 12.0–15.0)
Immature Granulocytes: 0 %
Lymphocytes Relative: 29 %
Lymphs Abs: 2.7 10*3/uL (ref 0.7–4.0)
MCH: 25.2 pg — ABNORMAL LOW (ref 26.0–34.0)
MCHC: 30.7 g/dL (ref 30.0–36.0)
MCV: 82.2 fL (ref 80.0–100.0)
Monocytes Absolute: 0.7 10*3/uL (ref 0.1–1.0)
Monocytes Relative: 8 %
Neutro Abs: 5 10*3/uL (ref 1.7–7.7)
Neutrophils Relative %: 54 %
Platelets: 432 10*3/uL — ABNORMAL HIGH (ref 150–400)
RBC: 3.37 MIL/uL — ABNORMAL LOW (ref 3.87–5.11)
RDW: 15.2 % (ref 11.5–15.5)
WBC: 9.2 10*3/uL (ref 4.0–10.5)
nRBC: 0 % (ref 0.0–0.2)

## 2020-02-18 LAB — BASIC METABOLIC PANEL
Anion gap: 7 (ref 5–15)
BUN: 12 mg/dL (ref 8–23)
CO2: 24 mmol/L (ref 22–32)
Calcium: 8.3 mg/dL — ABNORMAL LOW (ref 8.9–10.3)
Chloride: 108 mmol/L (ref 98–111)
Creatinine, Ser: 1.07 mg/dL — ABNORMAL HIGH (ref 0.44–1.00)
Glucose, Bld: 97 mg/dL (ref 70–99)
Potassium: 3.9 mmol/L (ref 3.5–5.1)
Sodium: 139 mmol/L (ref 135–145)

## 2020-02-18 LAB — SARS CORONAVIRUS 2 (TAT 6-24 HRS): SARS Coronavirus 2: NEGATIVE

## 2020-02-18 MED ORDER — VITAMIN D 25 MCG (1000 UNIT) PO TABS
2000.0000 [IU] | ORAL_TABLET | Freq: Every day | ORAL | Status: DC
  Administered 2020-02-18 – 2020-02-23 (×6): 2000 [IU] via ORAL
  Filled 2020-02-18 (×6): qty 2

## 2020-02-18 NOTE — Progress Notes (Signed)
PROGRESS NOTE    Bianca [name]  PYK:[id number] DOB: 04/17/1941 DOA: 02/11/2020 PCP: Trinna Post, PA-C  Brief Narrative:  79 year old white female Continued tobacco abuse COPD on inhalers Permanent A. fib CHADS2 score >4 on Eliquis as well as flecainide Orthostatic hypotension on midodrine CKD stage II-III Worsening protein energy malnutrition Recent diagnosis lung cancer being referred to [facility]  Admitted 02/11/2020 after fall tripping off the deck no LOC Recent weight loss noted by family-chest x-ray showed lung mass Orthopedics consulted and had total hip anterior arthroplasty 7/27 She is stabilized and we are planning for discharge on 8/2  Assessment & Plan:   Principal Problem:   Femoral neck fracture ([place]) Active Problems:   HYPERTENSION, BENIGN ESSENTIAL   COPD (chronic obstructive pulmonary disease) ([place])   Breast cancer of upper-outer quadrant of left female breast ([place])   Tobacco abuse   AF (paroxysmal atrial fibrillation) ([facility])   Protein-calorie malnutrition, severe   1. Hip fracture status post repair 7/27 Dr. Ninfa Linden anterior hip arthroplasty right-sided a. Defer management to Ortho re: post-op management, WBAT currently and resumed Eliquis 2.5 twice daily b. Pain management Norco Vicodin and morphine in graded fashion-given confusion with pain meds c. Therapy services = SNF dispo 2. Permanent A. Fib CHADS2 score >4 a. Holding Cozaar-was on flecainide but is not on this now? b. Currently low-dose metoprolol XL 12.5-- stop the as needed c. Continue Eliquis 2.5 twice daily d. No changes today 3. CKD 2-3 a. ARB discontinued this admission b. Amlodipine 5 for hypertension in addition to Toprol-XL as above 4.  Mild delirium a. Delirium is much improved compared to 7/28 b. SNF appropriate on discharge c. Continue BuSpar 15 twice daily Remeron 15 at bedtime and watch for sedation confusion 5. Orthostatic hypotension a. Continuing midodrine b. Blood  pressures are improved from 7/28 6. Protein energy malnutrition a. BMI 17-supplements as per dietary 7. Lung mass a. Supposed to have work-up for the same with PCP b.  Family undecided about where to have further cancer work-up and follow-up and have mentioned that usually biopsies will be performed after office visits and staging scans c. I had previously [place] lung navigator and she had spoken with family about this-if needed we will reinvolve her in patient's care but as an outpatient and I will send her a message on discharge 8. Continued tobacco use with COPD a. Unlikely will quit smoking b. Will need further follow-up in the outpatient setting as above   DVT prophylaxis:Eliquis Code Status: This and full code Family Communication:  No family present today I had a long discussion with the daughter on 7/30  disposition:   Status is: Inpatient  Remains inpatient appropriate because:Unsafe d/c plan and IV treatments appropriate due to intensity of illness or inability to take PO likely can discharge to facility on Monday pending  PAS SR number   Dispo:  Patient From: Home  Planned Disposition: [facility]  Expected discharge date: 7/31 2021 orthopedics  Medically stable for discharge: No    Consultants:   Orthopedics  Procedures: Anterior hip repair  Antimicrobials: None   Subjective:  Doing fair Several episodes of loose stools but not watery Daughter at bedside with many questions about cancer care and follow-up  Objective: Vitals:   02/17/20 2016 02/18/20 0348 02/18/20 0720 02/18/20 0923  BP: (!) 158/81 (!) 166/81  (!) 141/75  Pulse: 100 103  96  Resp: 16 14  18   Temp: 98.4 F (36.9 C) 98.5  F (36.9 C)  98.2 F (36.8 C)  TempSrc: Oral Oral  Oral  SpO2: 98% 97% 96% 95%  Weight:      Height:        Intake/Output Summary (Last 24 hours) at 02/18/2020 1526 Last data filed at 02/18/2020 0413 Gross per 24 hour  Intake --  Output 2  ml  Net -2 ml   Filed Weights   02/11/20 1041 02/13/20 1304  Weight: 47.6 kg 47.6 kg    Examination:  No specific change  General exam: EOMI NCAT Pleasant Respiratory system: Chest is clear without added sounds Cardiovascular system: E6-L5 holosystolic murmur appreciated today on monitor she is in sinus/sinus tach so we discontinued this on 7/31 Gastrointestinal system: Soft nontender no rebound no guarding no organomegaly slight distention Central nervous system: Intact no focal deficit Extremities: No edema Skin: Intact except where she had surgery Psychiatry: Euthymic less confused  Data Reviewed: I have personally reviewed following labs and imaging studies Covid test is negative Hemoglobin is 8.5 platelets 432 BUN/creatinine 12/1.07  Radiology Studies: No results found.   Scheduled Meds: . amLODipine  5 mg Oral Daily  . apixaban  2.5 mg Oral BID  . busPIRone  15 mg Oral BID  . cholecalciferol  2,000 Units Oral Daily  . feeding supplement (ENSURE ENLIVE)  237 mL Oral TID BM  . ferrous gluconate  324 mg Oral BID WC  . metoprolol succinate  12.5 mg Oral Daily  . midodrine  5 mg Oral BID WC  . mirtazapine  15 mg Oral QHS  . mometasone-formoterol  2 puff Inhalation BID  . multivitamin with minerals  1 tablet Oral Daily  . nicotine  7 mg Transdermal Daily  . senna  1 tablet Oral BID   Continuous Infusions:    LOS: 7 days    Time spent: 15  Nita Sells, MD [facility] To contact the attending provider between 7A-7P or the covering provider during after hours 7P-7A, please log into the web site [email or web address] and access using universal [place] password for that web site. If you do not have the password, please call the hospital operator.  02/18/2020, 3:26 PM

## 2020-02-18 NOTE — Progress Notes (Addendum)
     Subjective: 5 Days Post-Op Procedure(s) (LRB): TOTAL HIP ARTHROPLASTY ANTERIOR APPROACH (Right)  Patient reports pain as mild.    Objective:   VITALS:  Temp:  [97.3 F (36.3 C)-98.5 F (36.9 C)] 98.2 F (36.8 C) (08/01 0923) Pulse Rate:  [96-103] 96 (08/01 0923) Resp:  [14-18] 18 (08/01 0923) BP: (120-166)/(67-81) 141/75 (08/01 0923) SpO2:  [95 %-98 %] 95 % (08/01 0923)  Neurologically intact ABD soft Neurovascular intact Sensation intact distally Intact pulses distally Dorsiflexion/Plantar flexion intact Incision: dressing C/D/I and scant drainage   LABS Recent Labs    02/16/20 1056 02/18/20 0353  HGB 8.0* 8.5*  WBC 7.8 9.2  PLT 349 432*   Recent Labs    02/16/20 1056 02/18/20 0353  NA 139 139  K 3.8 3.9  CL 110 108  CO2 22 24  BUN 13 12  CREATININE 1.04* 1.07*  GLUCOSE 104* 97   No results for input(s): LABPT, INR in the last 72 hours.   Assessment/Plan: 5 Days Post-Op Procedure(s) (LRB): TOTAL HIP ARTHROPLASTY ANTERIOR APPROACH (Right) Anemia post op. Vitamin D level is low normal range 35 (range 30-100)  Advance diet Up with therapy Discharge to SNF  Daily Vitamin D supplements.2,000 Units per day.  Basil Dess 02/18/2020, 11:53 AMPatient ID: Bianca [name], female   DOB: [birth year], 79 y.o.   MRN: [id number]

## 2020-02-19 ENCOUNTER — Encounter: Payer: Self-pay | Admitting: Physician Assistant

## 2020-02-19 MED ORDER — METHOCARBAMOL 500 MG PO TABS: 500.0000 mg | ORAL_TABLET | Freq: Four times a day (QID) | ORAL | 0 refills | Status: DC | PRN

## 2020-02-19 MED ORDER — SENNA 8.6 MG PO TABS: 1.0000 | ORAL_TABLET | Freq: Two times a day (BID) | ORAL | 0 refills | Status: DC

## 2020-02-19 MED ORDER — MIRTAZAPINE 15 MG PO TABS: 15.0000 mg | ORAL_TABLET | Freq: Every day | ORAL | 0 refills | Status: DC

## 2020-02-19 MED ORDER — METOPROLOL SUCCINATE ER 25 MG PO TB24: 12.5000 mg | ORAL_TABLET | Freq: Every day | ORAL | Status: DC

## 2020-02-19 MED ORDER — MIDODRINE HCL 5 MG PO TABS: 5.0000 mg | ORAL_TABLET | Freq: Two times a day (BID) | ORAL | Status: DC

## 2020-02-19 MED ORDER — MIDODRINE HCL 5 MG PO TABS
5.0000 mg | ORAL_TABLET | Freq: Four times a day (QID) | ORAL | Status: DC
  Administered 2020-02-19: 5 mg via ORAL
  Filled 2020-02-19: qty 1

## 2020-02-19 MED ORDER — FERROUS GLUCONATE 324 (38 FE) MG PO TABS: 324.0000 mg | ORAL_TABLET | Freq: Two times a day (BID) | ORAL | 3 refills | Status: AC

## 2020-02-19 MED ORDER — MIDODRINE HCL 5 MG PO TABS
10.0000 mg | ORAL_TABLET | Freq: Four times a day (QID) | ORAL | Status: DC
  Administered 2020-02-20 (×3): 10 mg via ORAL
  Filled 2020-02-19 (×3): qty 2

## 2020-02-19 MED ORDER — BUSPIRONE HCL 15 MG PO TABS: 15.0000 mg | ORAL_TABLET | Freq: Two times a day (BID) | ORAL | 0 refills | Status: DC

## 2020-02-19 MED ORDER — AMLODIPINE BESYLATE 5 MG PO TABS: 5.0000 mg | ORAL_TABLET | Freq: Every day | ORAL | Status: DC

## 2020-02-19 NOTE — Discharge Summary (Signed)
Physician Discharge Summary  Bianca [name] OZH:[id number] DOB: [birth year] DOA: 02/11/2020  PCP: Trinna Post, PA-C  Admit date: 02/11/2020 Discharge date: 02/19/2020  Time spent: 45 minutes  Recommendations for Outpatient Follow-up:  1. Wishes outpatient referral to [facility] pulmonology for new diagnosis of lung cancer-please arrange the same after leaving skilled facility 2. Needs CBC Chem-12 in about 1 week 3. Continue to encourage smoking cessation 4. May need to adjust midodrine/metoprolol dependent on pressures and heart rate  Discharge Diagnoses:  Principal Problem:   Femoral neck fracture (HCC) Active Problems:   HYPERTENSION, BENIGN ESSENTIAL   COPD (chronic obstructive pulmonary disease) (HCC)   Breast cancer of upper-outer quadrant of left female breast (HCC)   Tobacco abuse   AF (paroxysmal atrial fibrillation) (HCC)   Protein-calorie malnutrition, severe   Discharge Condition: Improved  Diet recommendation: Heart healthy diabetic  Filed Weights   02/11/20 1041 02/13/20 1304  Weight: 47.6 kg 47.6 kg    History of present illness:  79 year old white female Continued tobacco abuse COPD on inhalers Permanent A. fib CHADS2 score >4 on Eliquis as well as flecainide Orthostatic hypotension on midodrine CKD stage II-III Worsening protein energy malnutrition Recent diagnosis lung cancer being referred to [facility]  Admitted 02/11/2020 after fall tripping off the deck no LOC Recent weight loss noted by family-chest x-ray showed lung mass Orthopedics consulted and had total hip anterior arthroplasty 7/27 She is stabilized and we are planning for discharge on 8/2  Hospital Course:  1. Hip fracture status post repair 7/27 Dr. Ninfa Linden anterior hip arthroplasty right-sided a. Defer management to Ortho re: post-op management, WBAT currently and resumed Eliquis 2.5 twice daily b. Pain management Norco Vicodin and morphine in graded fashion-given confusion with pain  meds c. Therapy services = SNF dispo 2. Permanent A. Fib       CHADS2 score >4 a. Holding Cozaar-was on flecainide but is not on this now? b. Currently low-dose metoprolol XL 12.5-- stop the as needed c. Continue Eliquis 2.5 twice daily  3. CKD 2-3 a. ARB discontinued this admission b. Amlodipine 5 for hypertension in addition to Toprol-XL as above 4.  Mild delirium a. Delirium is much improved compared to 7/28 b. SNF appropriate on discharge c. Continue BuSpar 15 twice daily Remeron 15 at bedtime and watch for sedation confusion 5. Orthostatic hypotension a. Continuing midodrine b. Blood pressures are improved from 7/28 6. Protein energy malnutrition a. BMI 17-supplements as per dietary 7. Lung mass a. Supposed to have work-up for the same with PCP b.  Family undecided about where to have further cancer work-up and follow-up and have mentioned that usually biopsies will be performed after office visits and staging scans c. As discussed with them extensively this will be done in the outpatient setting by the PCP 8. Continued tobacco use with COPD a. Unlikely will quit smoking but hopeful b. Will need further follow-up in the outpatient setting as above  Procedures:  Right total hip arthroplasty Dr. Ninfa Linden  Discharge Exam: Vitals:   02/19/20 0243 02/19/20 0828  BP: (!) 148/78   Pulse: 97   Resp: 19   Temp: 98.5 F (36.9 C)   SpO2: 98% 98%    General: Awake alert coherent no distress tolerating diet fairly well pain is controlled No nausea no vomiting Cardiovascular: S1-S2 no murmur rub or gallop Respiratory: Clinically clear no added sound Abdomen soft no rebound no guarding Right hip she is able to raise off bed bandages on it no further need  for wound care  Discharge Instructions   Discharge Instructions    Diet - low sodium heart healthy   Complete by: As directed    Increase activity slowly   Complete by: As directed    No wound care   Complete by: As  directed      Allergies as of 02/19/2020      Reactions   Xanax [alprazolam] Other (See Comments)   combative      Medication List    STOP taking these medications   denosumab 60 MG/ML Sosy injection Commonly known as: PROLIA   FEXOFENADINE HCL PO   losartan 25 MG tablet Commonly known as: COZAAR     TAKE these medications   acetaminophen 500 MG tablet Commonly known as: TYLENOL Take 1,000 mg by mouth every 6 (six) hours as needed for headache (pain).   Advair Diskus 100-50 MCG/DOSE Aepb Generic drug: Fluticasone-Salmeterol INHALE 1 PUFF TWICE A DAY AS DIRECTED **RINSE MOUTH AFTER USE** What changed:   how much to take  how to take this  when to take this  additional instructions   amLODipine 5 MG tablet Commonly known as: NORVASC Take 1 tablet (5 mg total) by mouth daily.   apixaban 2.5 MG Tabs tablet Commonly known as: Eliquis Take 1 tablet (2.5 mg total) by mouth 2 (two) times daily.   busPIRone 15 MG tablet Commonly known as: BUSPAR Take 1 tablet (15 mg total) by mouth 2 (two) times daily. What changed: when to take this   CALCIUM+D3 PO Take 1 tablet by mouth daily.   ferrous gluconate 324 MG tablet Commonly known as: FERGON Take 1 tablet (324 mg total) by mouth 2 (two) times daily with a meal.   HYDROcodone-acetaminophen 5-325 MG tablet Commonly known as: NORCO/VICODIN Take 1 tablet by mouth every 4 (four) hours as needed for moderate pain.   lactose free nutrition Liqd Take 237 mLs by mouth 2 (two) times daily between meals.   methocarbamol 500 MG tablet Commonly known as: ROBAXIN Take 1 tablet (500 mg total) by mouth every 6 (six) hours as needed for muscle spasms.   metoprolol succinate 25 MG 24 hr tablet Commonly known as: TOPROL-XL Take 0.5 tablets (12.5 mg total) by mouth daily.   midodrine 5 MG tablet Commonly known as: PROAMATINE Take 1 tablet (5 mg total) by mouth 2 (two) times daily with a meal. What changed:   how much to  take  how to take this  when to take this  additional instructions   mirtazapine 15 MG tablet Commonly known as: Remeron Take 1 tablet (15 mg total) by mouth at bedtime.   ProAir HFA 108 (90 Base) MCG/ACT inhaler Generic drug: albuterol INHALE 1-2 PUFFS INTO THE LUNGS EVERY 4 HOURS AS NEEDED What changed: See the new instructions.   senna 8.6 MG Tabs tablet Commonly known as: SENOKOT Take 1 tablet (8.6 mg total) by mouth 2 (two) times daily.   UNABLE TO FIND Rx: L8000- Post Surgical Bras (Quantity: 6)\ Dx: 174.9; Left mastectomy   vitamin C 500 MG tablet Commonly known as: ASCORBIC ACID Take 500 mg by mouth daily.   Vitamin D3 50 MCG (2000 UT) Tabs Take 2,000 Units by mouth daily.      Allergies  Allergen Reactions  . Xanax [Alprazolam] Other (See Comments)    combative    Follow-up Information    Mcarthur Rossetti, MD. Schedule an appointment as soon as possible for a visit in 2 week(s).  Specialty: Orthopedic Surgery Contact information: [address] [place] [address] [phone]                The results of significant diagnostics from this hospitalization (including imaging, microbiology, ancillary and laboratory) are listed below for reference.    Significant Diagnostic Studies: DG Chest 1 View  Result Date: 02/11/2020 CLINICAL DATA:  Fall.  Concern for possible hip fracture. EXAM: CHEST  1 VIEW COMPARISON:  01/30/2020 FINDINGS: Normal heart size. There is blunting of the left costophrenic angle concerning for small effusion. Left lower lobe lung mass is again identified measuring 4.7 cm. The lungs are otherwise clear. No acute airspace consolidation IMPRESSION: 1. Small left pleural effusion. 2. Left lower lobe lung mass. Advise further evaluation with contrast enhanced CT of the chest. Electronically Signed   By: Kerby Moors M.D.   On: 02/11/2020 11:53   DG Chest 2 View  Result Date: 01/31/2020 CLINICAL DATA:  Recent ammonia,  productive cough, remote left breast cancer, COPD and hypertension EXAM: CHEST - 2 VIEW COMPARISON:  01/24/2019 FINDINGS: Left lower lobe posterior masslike opacity with central lucency measuring 5 x 4 cm, concerning for a lung mass with central cavitation. This warrants further evaluation with chest CT. Mild hyperinflation, suspect underlying COPD/emphysema. Minor apical scarring. Right lung is clear. Normal heart size and vascularity.  Aorta atherosclerotic. No superimposed edema, effusion or pneumothorax. Trachea midline. Aorta atherosclerotic. Bones are osteopenic and degenerative changes noted of the spine. Minor scoliosis. T12 compression fracture as before, age indeterminate. Height loss is estimated at 50% anteriorly. IMPRESSION: Posterior left lower lobe masslike opacity as above. Recommend further evaluation with chest CT. Aortic Atherosclerosis (ICD10-I70.0) and Emphysema (ICD10-J43.9). These results will be called to the ordering clinician or representative by the Radiologist Assistant, and communication documented in the PACS or [employer]. Electronically Signed   By: Jerilynn Mages.  Shick M.D.   On: 01/31/2020 11:49   CT HEAD WO CONTRAST  Result Date: 02/12/2020 CLINICAL DATA:  Facial trauma. Additional provided: Recent fall, right hip pain. EXAM: CT HEAD WITHOUT CONTRAST CT CERVICAL SPINE WITHOUT CONTRAST TECHNIQUE: Multidetector CT imaging of the head and cervical spine was performed following the standard protocol without intravenous contrast. Multiplanar CT image reconstructions of the cervical spine were also generated. COMPARISON:  Head CT 01/21/2019 FINDINGS: CT HEAD FINDINGS Brain: Stable, moderate generalized parenchymal atrophy. Stable, moderate patchy and ill-defined hypoattenuation within the cerebral white matter which is nonspecific, but consistent with chronic small vessel ischemic disease. There is no acute intracranial hemorrhage. No demarcated cortical infarct. No extra-axial fluid  collection. No evidence of intracranial mass. No midline shift. Vascular: No hyperdense vessel.  Atherosclerotic calcifications. Skull: No calvarial fracture or focal suspicious osseous lesion. Sinuses/Orbits: Visualized orbits show no acute finding. Mild ethmoid sinus mucosal thickening. No significant mastoid effusion CT CERVICAL SPINE FINDINGS Alignment: Straightening of the expected cervical lordosis. Trace C3-C4 grade 1 anterolisthesis. Skull base and vertebrae: The basion-dental and atlanto-dental intervals are maintained.No evidence of acute fracture to the cervical spine. Mild age-indeterminate T3 superior endplate compression deformity. Soft tissues and spinal canal: No prevertebral fluid or swelling. No visible canal hematoma. Disc levels: Cervical spondylosis with multilevel disc space narrowing, posterior disc osteophytes, uncovertebral and facet hypertrophy. Disc space narrowing is advanced at C4-C5, C5-C6 and C6-C7. A C5-C6 posterior disc osteophyte complex contributes to at least mild/moderate spinal canal stenosis. Upper chest: No consolidation within the imaged lung apices. No visible pneumothorax atherosclerotic calcifications within the visualized aortic arch and proximal major  branch vessels of the wit neck as well as bilateral carotid arteries. IMPRESSION: CT head: 1. No evidence of acute intracranial abnormality. 2. Stable moderate generalized parenchymal atrophy and chronic small vessel ischemic disease. 3. Mild ethmoid sinus mucosal thickening. CT cervical spine: 1. No evidence of acute fracture to the cervical spine. 2. Age-indeterminate mild T3 superior endplate compression fracture. 3. Cervical spondylosis as outlined. Electronically Signed   By: Kellie Simmering DO   On: 02/12/2020 09:31   CT CHEST W CONTRAST  Result Date: 02/11/2020 CLINICAL DATA:  79 year old female with history of unintended weight loss. EXAM: CT CHEST, ABDOMEN, AND PELVIS WITH CONTRAST TECHNIQUE: Multidetector CT  imaging of the chest, abdomen and pelvis was performed following the standard protocol during bolus administration of intravenous contrast. CONTRAST:  28mL OMNIPAQUE IOHEXOL 300 MG/ML  SOLN COMPARISON:  No priors. FINDINGS: CT CHEST FINDINGS Cardiovascular: Heart size is normal. There is no significant pericardial fluid, thickening or pericardial calcification. There is aortic atherosclerosis, as well as atherosclerosis of the great vessels of the mediastinum and the coronary arteries, including calcified atherosclerotic plaque in the left main, left anterior descending, left circumflex and right coronary arteries. Mediastinum/Nodes: No pathologically enlarged mediastinal or hilar lymph nodes. Esophagus is unremarkable in appearance. No axillary lymphadenopathy. Lungs/Pleura: In the left lower lobe (axial image 40 of series 3 and coronal image 69 of series 6) there is a thick-walled cavitary mass measuring approximately 4.0 x 3.5 x 6.2 cm. This mass has macrolobulated and slightly spiculated margins. Internal air-fluid level. Trace left pleural effusion lying dependently. Additional mass-like area of architectural distortion in the base of the right lower lobe (axial image 129 of series 5) measuring 3.9 x 1.4 cm, favored to reflect atelectasis given adjacent areas of mucoid impaction and linear architectural distortion. Musculoskeletal: Chronic appearing compression fracture of T11 with 60% loss of anterior vertebral body height. There are no aggressive appearing lytic or blastic lesions noted in the visualized portions of the skeleton. CT ABDOMEN PELVIS FINDINGS Hepatobiliary: No suspicious cystic or solid hepatic lesions. No intra or extrahepatic biliary ductal dilatation. Gallbladder is normal in appearance. Pancreas: No pancreatic mass. No pancreatic ductal dilatation. No pancreatic or peripancreatic fluid collections or inflammatory changes. Spleen: Unremarkable. Adrenals/Urinary Tract: Severe atrophy of the  left kidney. Right kidney and bilateral adrenal glands are normal in appearance. No hydroureteronephrosis. Urinary bladder is moderately distended, but otherwise unremarkable in appearance. Stomach/Bowel: Normal appearance of the stomach. No pathologic dilatation of small bowel or colon. Numerous colonic diverticulae are noted, particularly in the sigmoid colon, without surrounding inflammatory changes to suggest an acute diverticulitis at this time. The appendix is not confidently identified and may be surgically absent. Regardless, there are no inflammatory changes noted adjacent to the cecum to suggest the presence of an acute appendicitis at this time. Vascular/Lymphatic: Aortic atherosclerosis, without evidence of aneurysm or dissection in the abdominal or pelvic vasculature. No lymphadenopathy noted in the abdomen or pelvis. Reproductive: Uterus and ovaries are atrophic. Other: No significant volume of ascites.  No pneumoperitoneum. Musculoskeletal: Right femoral neck fracture which appears foreshortened and slightly rotated. There are no aggressive appearing lytic or blastic lesions noted in the visualized portions of the skeleton. IMPRESSION: 1. 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 Trace left pleural effusion is likely malignant. 2. Additional mass-like area of architectural distortion in the base of the right lower lobe, favored to represent  an area of possible rounded atelectasis, although close attention on follow-up studies is recommended to ensure stability or resolution. 3. No definite signs of metastatic disease to the abdomen or pelvis. 4. Right femoral neck fracture which is mildly rotated and foreshortened. 5. Colonic diverticulosis without evidence of acute diverticulitis at this time. 6. Aortic atherosclerosis, in addition to left main and 3 vessel coronary artery disease. 7.  Additional incidental findings, as above. Electronically Signed   By: Vinnie Langton M.D.   On: 02/11/2020 16:28   CT CERVICAL SPINE WO CONTRAST  Result Date: 02/12/2020 CLINICAL DATA:  Facial trauma. Additional provided: Recent fall, right hip pain. EXAM: CT HEAD WITHOUT CONTRAST CT CERVICAL SPINE WITHOUT CONTRAST TECHNIQUE: Multidetector CT imaging of the head and cervical spine was performed following the standard protocol without intravenous contrast. Multiplanar CT image reconstructions of the cervical spine were also generated. COMPARISON:  Head CT 01/21/2019 FINDINGS: CT HEAD FINDINGS Brain: Stable, moderate generalized parenchymal atrophy. Stable, moderate patchy and ill-defined hypoattenuation within the cerebral white matter which is nonspecific, but consistent with chronic small vessel ischemic disease. There is no acute intracranial hemorrhage. No demarcated cortical infarct. No extra-axial fluid collection. No evidence of intracranial mass. No midline shift. Vascular: No hyperdense vessel.  Atherosclerotic calcifications. Skull: No calvarial fracture or focal suspicious osseous lesion. Sinuses/Orbits: Visualized orbits show no acute finding. Mild ethmoid sinus mucosal thickening. No significant mastoid effusion CT CERVICAL SPINE FINDINGS Alignment: Straightening of the expected cervical lordosis. Trace C3-C4 grade 1 anterolisthesis. Skull base and vertebrae: The basion-dental and atlanto-dental intervals are maintained.No evidence of acute fracture to the cervical spine. Mild age-indeterminate T3 superior endplate compression deformity. Soft tissues and spinal canal: No prevertebral fluid or swelling. No visible canal hematoma. Disc levels: Cervical spondylosis with multilevel disc space narrowing, posterior disc osteophytes, uncovertebral and facet hypertrophy. Disc space narrowing is advanced at C4-C5, C5-C6 and C6-C7. A C5-C6 posterior disc osteophyte complex contributes to at least  mild/moderate spinal canal stenosis. Upper chest: No consolidation within the imaged lung apices. No visible pneumothorax atherosclerotic calcifications within the visualized aortic arch and proximal major branch vessels of the wit neck as well as bilateral carotid arteries. IMPRESSION: CT head: 1. No evidence of acute intracranial abnormality. 2. Stable moderate generalized parenchymal atrophy and chronic small vessel ischemic disease. 3. Mild ethmoid sinus mucosal thickening. CT cervical spine: 1. No evidence of acute fracture to the cervical spine. 2. Age-indeterminate mild T3 superior endplate compression fracture. 3. Cervical spondylosis as outlined. Electronically Signed   By: Kellie Simmering DO   On: 02/12/2020 09:31   CT ABDOMEN PELVIS W CONTRAST  Result Date: 02/11/2020 CLINICAL DATA:  79 year old female with history of unintended weight loss. EXAM: CT CHEST, ABDOMEN, AND PELVIS WITH CONTRAST TECHNIQUE: Multidetector CT imaging of the chest, abdomen and pelvis was performed following the standard protocol during bolus administration of intravenous contrast. CONTRAST:  34mL OMNIPAQUE IOHEXOL 300 MG/ML  SOLN COMPARISON:  No priors. FINDINGS: CT CHEST FINDINGS Cardiovascular: Heart size is normal. There is no significant pericardial fluid, thickening or pericardial calcification. There is aortic atherosclerosis, as well as atherosclerosis of the great vessels of the mediastinum and the coronary arteries, including calcified atherosclerotic plaque in the left main, left anterior descending, left circumflex and right coronary arteries. Mediastinum/Nodes: No pathologically enlarged mediastinal or hilar lymph nodes. Esophagus is unremarkable in appearance. No axillary lymphadenopathy. Lungs/Pleura: In the left lower lobe (axial image 40 of series 3 and coronal image 69 of series 6) there is a  thick-walled cavitary mass measuring approximately 4.0 x 3.5 x 6.2 cm. This mass has macrolobulated and slightly  spiculated margins. Internal air-fluid level. Trace left pleural effusion lying dependently. Additional mass-like area of architectural distortion in the base of the right lower lobe (axial image 129 of series 5) measuring 3.9 x 1.4 cm, favored to reflect atelectasis given adjacent areas of mucoid impaction and linear architectural distortion. Musculoskeletal: Chronic appearing compression fracture of T11 with 60% loss of anterior vertebral body height. There are no aggressive appearing lytic or blastic lesions noted in the visualized portions of the skeleton. CT ABDOMEN PELVIS FINDINGS Hepatobiliary: No suspicious cystic or solid hepatic lesions. No intra or extrahepatic biliary ductal dilatation. Gallbladder is normal in appearance. Pancreas: No pancreatic mass. No pancreatic ductal dilatation. No pancreatic or peripancreatic fluid collections or inflammatory changes. Spleen: Unremarkable. Adrenals/Urinary Tract: Severe atrophy of the left kidney. Right kidney and bilateral adrenal glands are normal in appearance. No hydroureteronephrosis. Urinary bladder is moderately distended, but otherwise unremarkable in appearance. Stomach/Bowel: Normal appearance of the stomach. No pathologic dilatation of small bowel or colon. Numerous colonic diverticulae are noted, particularly in the sigmoid colon, without surrounding inflammatory changes to suggest an acute diverticulitis at this time. The appendix is not confidently identified and may be surgically absent. Regardless, there are no inflammatory changes noted adjacent to the cecum to suggest the presence of an acute appendicitis at this time. Vascular/Lymphatic: Aortic atherosclerosis, without evidence of aneurysm or dissection in the abdominal or pelvic vasculature. No lymphadenopathy noted in the abdomen or pelvis. Reproductive: Uterus and ovaries are atrophic. Other: No significant volume of ascites.  No pneumoperitoneum. Musculoskeletal: Right femoral neck fracture  which appears foreshortened and slightly rotated. There are no aggressive appearing lytic or blastic lesions noted in the visualized portions of the skeleton. IMPRESSION: 1. 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 Trace left pleural effusion is likely malignant. 2. Additional mass-like area of architectural distortion in the base of the right lower lobe, favored to represent an area of possible rounded atelectasis, although close attention on follow-up studies is recommended to ensure stability or resolution. 3. No definite signs of metastatic disease to the abdomen or pelvis. 4. Right femoral neck fracture which is mildly rotated and foreshortened. 5. Colonic diverticulosis without evidence of acute diverticulitis at this time. 6. Aortic atherosclerosis, in addition to left main and 3 vessel coronary artery disease. 7. Additional incidental findings, as above. Electronically Signed   By: Vinnie Langton M.D.   On: 02/11/2020 16:28   Pelvis Portable  Result Date: 02/13/2020 CLINICAL DATA:  Post right hip arthroplasty. EXAM: PORTABLE PELVIS 1-2 VIEWS COMPARISON:  Preoperative radiographs FINDINGS: Right hip arthroplasty in expected alignment. No periprosthetic lucency or fracture. Recent postsurgical change includes air and edema in the joint space and soft tissues. Lateral skin staples. Increased density in the urinary bladder suggestive of excretion of IV contrast from prior CT. IMPRESSION: Right hip arthroplasty without immediate postoperative complication. Electronically Signed   By: Keith Rake M.D.   On: 02/13/2020 18:21   DG C-Arm 1-60 Min  Result Date: 02/13/2020 CLINICAL DATA:  Right hip replacement. EXAM: DG C-ARM 1-60 MIN; OPERATIVE RIGHT HIP WITH PELVIS FLUOROSCOPY TIME:  Fluoroscopy Time:  18 seconds Radiation Exposure Index (if provided by the fluoroscopic device): 1.375  mGy Number of Acquired Spot Images: 3 COMPARISON:  Preoperative radiograph 02/11/2020 FINDINGS: Three fluoroscopic spot  images obtained in the operating room. Interval right hip arthroplasty. Fluoroscopy time 18 seconds. Dose 1.375 mGy. IMPRESSION: Fluoroscopic spot views after right hip arthroplasty. Electronically Signed   By: Keith Rake M.D.   On: 02/13/2020 18:20   DG HIP OPERATIVE UNILAT W OR W/O PELVIS RIGHT  Result Date: 02/13/2020 CLINICAL DATA:  Right hip replacement. EXAM: DG C-ARM 1-60 MIN; OPERATIVE RIGHT HIP WITH PELVIS FLUOROSCOPY TIME:  Fluoroscopy Time:  18 seconds Radiation Exposure Index (if provided by the fluoroscopic device): 1.375 mGy Number of Acquired Spot Images: 3 COMPARISON:  Preoperative radiograph 02/11/2020 FINDINGS: Three fluoroscopic spot images obtained in the operating room. Interval right hip arthroplasty. Fluoroscopy time 18 seconds. Dose 1.375 mGy. IMPRESSION: Fluoroscopic spot views after right hip arthroplasty. Electronically Signed   By: Keith Rake M.D.   On: 02/13/2020 18:20   DG Hip Unilat With Pelvis 2-3 Views Right  Result Date: 02/11/2020 CLINICAL DATA:  Possible hip fracture EXAM: DG HIP (WITH OR WITHOUT PELVIS) 2-3V RIGHT COMPARISON:  None. FINDINGS: Osteopenia. Impacted and foreshortened transcervical or basicervical fracture of the right femoral neck. No other displaced fracture or dislocation of the pelvis. The proximal left femur is intact in single view. IMPRESSION: Impacted and foreshortened transcervical or basicervical fracture of the right femoral neck. No other displaced fracture or dislocation of the pelvis. The proximal left femur is intact in single view. Electronically Signed   By: Eddie Candle M.D.   On: 02/11/2020 11:55    Microbiology: Recent Results (from the past 240 hour(s))  SARS Coronavirus 2 by RT PCR (hospital order, performed in [facility] hospital lab) Nasopharyngeal Nasopharyngeal Swab     Status: None   Collection  Time: 02/11/20  2:19 PM   Specimen: Nasopharyngeal Swab  Result Value Ref Range Status   SARS Coronavirus 2 NEGATIVE NEGATIVE Final    Comment: (NOTE) SARS-CoV-2 target nucleic acids are NOT DETECTED.  The SARS-CoV-2 RNA is generally detectable in upper and lower respiratory specimens during the acute phase of infection. The lowest concentration of SARS-CoV-2 viral copies this assay can detect is 250 copies / mL. A negative result does not preclude SARS-CoV-2 infection and should not be used as the sole basis for treatment or other patient management decisions.  A negative result may occur with improper specimen collection / handling, submission of specimen other than nasopharyngeal swab, presence of viral mutation(s) within the areas targeted by this assay, and inadequate number of viral copies (<250 copies / mL). A negative result must be combined with clinical observations, patient history, and epidemiological information.  Fact Sheet for Patients:   StrictlyIdeas.no  Fact Sheet for Healthcare Providers: BankingDealers.co.za  This test is not yet approved or  cleared by the [place] FDA and has been authorized for detection and/or diagnosis of SARS-CoV-2 by FDA under an Emergency Use Authorization (EUA).  This EUA will remain in effect (meaning this test can be used) for the duration of the COVID-19 declaration under Section 564(b)(1) of the Act, 21 U.S.C. section 360bbb-3(b)(1), unless the authorization is terminated or revoked sooner.  Performed at [facility], [place] [address] [place], [place] [address]   Surgical pcr screen     Status: None   Collection Time: 02/12/20  9:37 PM   Specimen: Nasal Mucosa; Nasal Swab  Result Value Ref Range Status   MRSA, PCR NEGATIVE NEGATIVE Final   Staphylococcus aureus NEGATIVE NEGATIVE Final    Comment: (NOTE) The Xpert SA Assay (FDA approved for NASAL specimens  in patients  60 years of age and older), is one component of a comprehensive surveillance program. It is not intended to diagnose infection nor to guide or monitor treatment. Performed at [facility], [place] [address], [place], [place] [address]   SARS CORONAVIRUS 2 (TAT 6-24 HRS) Nasopharyngeal Nasopharyngeal Swab     Status: None   Collection Time: 02/18/20  3:19 PM   Specimen: Nasopharyngeal Swab  Result Value Ref Range Status   SARS Coronavirus 2 NEGATIVE NEGATIVE Final    Comment: (NOTE) SARS-CoV-2 target nucleic acids are NOT DETECTED.  The SARS-CoV-2 RNA is generally detectable in upper and lower respiratory specimens during the acute phase of infection. Negative results do not preclude SARS-CoV-2 infection, do not rule out co-infections with other pathogens, and should not be used as the sole basis for treatment or other patient management decisions. Negative results must be combined with clinical observations, patient history, and epidemiological information. The expected result is Negative.  Fact Sheet for Patients: SugarRoll.be  Fact Sheet for Healthcare Providers: https://www.woods-mathews.com/  This test is not yet approved or cleared by the [place] FDA and  has been authorized for detection and/or diagnosis of SARS-CoV-2 by FDA under an Emergency Use Authorization (EUA). This EUA will remain  in effect (meaning this test can be used) for the duration of the COVID-19 declaration under Se ction 564(b)(1) of the Act, 21 U.S.C. section 360bbb-3(b)(1), unless the authorization is terminated or revoked sooner.  Performed at [facility], [place] [address], [place], [place] [address]      Labs: Basic Metabolic Panel: Recent Labs  Lab 02/13/20 [phone] 02/14/20 0349 02/15/20 0311 02/16/20 1056 02/18/20 0353  NA 136 137 142 139 139  K 3.6 3.8 3.9 3.8 3.9  CL 106 106 112* 110 108  CO2 22 20* 22 22 24   GLUCOSE 106* 199*  100* 104* 97  BUN 11 13 18 13 12   CREATININE 1.03* 1.11* 1.11* 1.04* 1.07*  CALCIUM 8.4* 7.7* 8.0* 7.9* 8.3*  MG 1.9  --   --   --   --   PHOS 2.4*  --   --   --   --    Liver Function Tests: Recent Labs  Lab 02/13/20 0651  AST 22  ALT 13  ALKPHOS 64  BILITOT 0.9  PROT 6.2*  ALBUMIN 2.6*   No results for input(s): LIPASE, AMYLASE in the last 168 hours. No results for input(s): AMMONIA in the last 168 hours. CBC: Recent Labs  Lab 02/13/20 0651 02/14/20 0349 02/15/20 0311 02/16/20 1056 02/18/20 0353  WBC 10.9* 10.5 10.0 7.8 9.2  NEUTROABS  --   --  6.8 5.1 5.0  HGB 10.2* 9.0* 8.4* 8.0* 8.5*  HCT 32.7* 29.3* 26.7* 25.3* 27.7*  MCV 82.2 82.5 82.2 81.9 82.2  PLT 335 310 333 349 432*   Cardiac Enzymes: No results for input(s): CKTOTAL, CKMB, CKMBINDEX, TROPONINI in the last 168 hours. BNP: BNP (last 3 results) No results for input(s): BNP in the last 8760 hours.  ProBNP (last 3 results) No results for input(s): PROBNP in the last 8760 hours.  CBG: Recent Labs  Lab 02/14/20 2129 02/15/20 1139 02/15/20 2159 02/16/20 0718  GLUCAP 133* 96 108* 87       Signed:  Nita Sells MD   [facility] 02/19/2020, 8:41 AM

## 2020-02-19 NOTE — Plan of Care (Signed)

## 2020-02-19 NOTE — Progress Notes (Signed)
Occupational Therapy Treatment Patient Details Name: Bianca [name] MRN: [id number] DOB: [birth year] Today's Date: 02/19/2020    History of present illness Pt is an 79 yr old female admitted on 02/11/20 with hip fx after fall tripping on deck hitting head on chair and requiring R THA direct anterior approach on 02/13/20. CT head negative. Age-indeterminate mild T3 superior endplate compression fracture. C-Spine: negative. CT abdmomen: LLL lung mass, trace pleural effusion. Of note, pt with cough x4 weeks and weight loss of 10# in a few months. PMHx:  atrial fibrillation, anxiety, COPD, arthritis, h/o breast CA s/p mastectomy.   OT comments  Pt progressing well. Pt modA from bed and BSC to standing. Pt maxA+2 for stand pivot due to dizziness and decreased strength in RLE and nearly unable to slide feet or take steps without increased time and verbal cues for sequencing. Pt continues to be totalA for toilet hygiene. After eating a few bites of breakfast and transferring from [facility] to recliner, pt's initial BP increased from 77/58  to BP: 89/57 on commode; to 138/78 with feet reclined and sitting in recliner to conclude session. HR 100-103 BPM after exertion. Pt would greatly benefit from continued OT skilled services. OT following acutely.    Follow Up Recommendations  SNF;Supervision/Assistance - 24 hour    Equipment Recommendations  Other (comment) (defer to next facility)    Recommendations for Other Services      Precautions / Restrictions Precautions Precautions: Fall Precaution Comments: x6 times in past 6 months, watch BP (orthostatic hypotension) Restrictions Weight Bearing Restrictions: Yes RLE Weight Bearing: Weight bearing as tolerated Other Position/Activity Restrictions: poor vision       Mobility Bed Mobility Overal bed mobility: Needs Assistance Bed Mobility: Supine to Sit     Supine to sit: Min assist     General bed mobility comments: Pt supine reclined in recliner so  no formal bed mobility performed. mod assistance to use bed pad to scoot to edge of the recliner chair.  Transfers Overall transfer level: Needs assistance Equipment used: Rolling walker (2 wheeled) Transfers: Sit to/from Stand Sit to Stand: Mod assist         General transfer comment: modA from bed and BSC to standing    Balance     Sitting balance-Leahy Scale: Fair       Standing balance-Leahy Scale: Poor Standing balance comment: leaning posteriorly with dizziness                           ADL either performed or assessed with clinical judgement   ADL Overall ADL's : Needs assistance/impaired                     Lower Body Dressing: Total assistance;Sitting/lateral leans;Bed level Lower Body Dressing Details (indicate cue type and reason): assist to donn socks Toilet Transfer: Maximal assistance;+2 for physical assistance;Stand-pivot;RW Toilet Transfer Details (indicate cue type and reason): multimodal cues; pt taking minimal steps with decreased step height to recliner         Functional mobility during ADLs: Maximal assistance;+2 for physical assistance;+2 for safety/equipment;Rolling walker;Cueing for safety General ADL Comments: Pt limited by severe lethargy, decreased strength, decreased ability to care for self, and increased pain.     Vision       Perception     Praxis      Cognition Arousal/Alertness: Awake/alert Behavior During Therapy: WFL for tasks assessed/performed Overall Cognitive Status: Impaired/Different from baseline Area  of Impairment: Problem solving;Safety/judgement                         Safety/Judgement: Decreased awareness of safety;Decreased awareness of deficits   Problem Solving: Slow processing General Comments: Pt requires cues for sequencing through task.        Exercises     Shoulder Instructions       General Comments BP: 89/57 on commode; 138/78 with feet reclined and sitting in  recliner to conclude session. HR 100-103 BPM after exertion.    Pertinent Vitals/ Pain       Pain Assessment: Faces Faces Pain Scale: Hurts a little bit Pain Location: R hip and leg Pain Descriptors / Indicators: Sore Pain Intervention(s): Monitored during session  Home Living                                          Prior Functioning/Environment              Frequency  Min 2X/week        Progress Toward Goals  OT Goals(current goals can now be found in the care plan section)  Progress towards OT goals: Progressing toward goals  Acute Rehab OT Goals Patient Stated Goal: to go to SNF OT Goal Formulation: With patient Time For Goal Achievement: 02/27/20 Potential to Achieve Goals: Good ADL Goals Pt Will Perform Lower Body Dressing: with mod assist;sitting/lateral leans;sit to/from stand Pt Will Transfer to Toilet: with min assist;ambulating;bedside commode Additional ADL Goal #1: Pt will increase to minA overall for bed mobility.  Plan Discharge plan remains appropriate    Co-evaluation                 AM-PAC OT "6 Clicks" Daily Activity     Outcome Measure   Help from another person eating meals?: A Little Help from another person taking care of personal grooming?: A Little Help from another person toileting, which includes using toliet, bedpan, or urinal?: Total Help from another person bathing (including washing, rinsing, drying)?: A Lot Help from another person to put on and taking off regular upper body clothing?: A Little Help from another person to put on and taking off regular lower body clothing?: Total 6 Click Score: 13    End of Session Equipment Utilized During Treatment: Gait belt;Rolling walker  OT Visit Diagnosis: Unsteadiness on feet (R26.81);Muscle weakness (generalized) (M62.81);Pain Pain - Right/Left: Right Pain - part of body: Hip   Activity Tolerance Patient tolerated treatment well;Patient limited by fatigue    Patient Left in chair;with call bell/phone within reach;with chair alarm set   Nurse Communication Mobility status        Time: 2800-3491 OT Time Calculation (min): 39 min  Charges: OT General Charges $OT Visit: 1 Visit OT Treatments $Self Care/Home Management : 23-37 mins $Therapeutic Activity: 8-22 mins  Jefferey Pica, OTR/L Acute Rehabilitation Services Pager: [phone] Office: [phone]    Maury C 02/19/2020, 4:18 PM

## 2020-02-19 NOTE — Progress Notes (Addendum)
Physical Therapy Treatment Patient Details Name: Bianca [name] MRN: [id number] DOB: [birth year] Today's Date: 02/19/2020    History of Present Illness (P) Pt is an 79 yr old female admitted on 02/11/20 with hip fx after fall tripping on deck hitting head on chair and requiring R THA direct anterior approach on 02/13/20. CT head negative. Age-indeterminate mild T3 superior endplate compression fracture. C-Spine: negative. CT abdmomen: LLL lung mass, trace pleural effusion. Of note, pt with cough x4 weeks and weight loss of 10# in a few months. PMHx:  atrial fibrillation, anxiety, COPD, arthritis, h/o breast CA s/p mastectomy.    PT Comments    Pt supine/reclined in chair on arrival.  Daughter present at bedside and reports patient informed her that her BP was low when they transferred her to the recliner chair this am.  PTA performed orthostatics as was unable to complete as she had a presyncopal episode in standing but never lost consciousness. Deferred further tx and informed RN caring for patient.   Orthostatic Vitals: 117/67-Lying 102/59-Sitting 70/52 -Standing. Started BP in standing but had to return to sitting as she was near syncope.    Follow Up Recommendations  SNF     Equipment Recommendations  Rolling walker with 5" wheels    Recommendations for Other Services       Precautions / Restrictions Precautions Precautions: (P) Fall Precaution Comments: (P) x6 times in past 6 months, watch BP (orthostatic hypotension) Restrictions Weight Bearing Restrictions: Yes RLE Weight Bearing: Weight bearing as tolerated Other Position/Activity Restrictions: poor vision    Mobility  Bed Mobility               General bed mobility comments: Pt supine reclined in recliner so no formal bed mobility performed. mod assistance to use bed pad to scoot to edge of the recliner chair.  Transfers Overall transfer level: Needs assistance Equipment used: Rolling walker (2  wheeled) Transfers: Sit to/from Stand Sit to Stand: Min assist         General transfer comment: Min assistance to rise into standing.  Ambulation/Gait Ambulation/Gait assistance:  (NT BP dropped to 70/52 in standing and patient near syncope.)               Stairs             Wheelchair Mobility    Modified Rankin (Stroke Patients Only)       Balance     Sitting balance-Leahy Scale: Fair       Standing balance-Leahy Scale: Poor Standing balance comment: leaning posteriorly and very dizzy this session.                            Cognition Arousal/Alertness: Awake/alert Behavior During Therapy: WFL for tasks assessed/performed Overall Cognitive Status: Impaired/Different from baseline                                 General Comments: Pt more confused this session and required frequent re-cueing.      Exercises      General Comments        Pertinent Vitals/Pain Pain Assessment: (P) Faces Faces Pain Scale: Hurts a little bit Pain Location: R hip and leg Pain Descriptors / Indicators: Sore Pain Intervention(s): Monitored during session;Repositioned    Home Living  Prior Function            PT Goals (current goals can now be found in the care plan section) Acute Rehab PT Goals Patient Stated Goal: to go to SNF Potential to Achieve Goals: Good Progress towards PT goals: Progressing toward goals    Frequency    Min 3X/week      PT Plan Current plan remains appropriate    Co-evaluation              AM-PAC PT "6 Clicks" Mobility   Outcome Measure  Help needed turning from your back to your side while in a flat bed without using bedrails?: A Little Help needed moving from lying on your back to sitting on the side of a flat bed without using bedrails?: A Little Help needed moving to and from a bed to a chair (including a wheelchair)?: A Little Help needed standing up from  a chair using your arms (e.g., wheelchair or bedside chair)?: A Little Help needed to walk in hospital room?: A Lot Help needed climbing 3-5 steps with a railing? : Total 6 Click Score: 15    End of Session Equipment Utilized During Treatment: Gait belt Activity Tolerance: Treatment limited secondary to medical complications (Comment) (BP dropped.) Patient left: in chair;with call bell/phone within reach;with family/visitor present Nurse Communication: Mobility status PT Visit Diagnosis: Unsteadiness on feet (R26.81);Pain;Difficulty in walking, not elsewhere classified (R26.2);Other abnormalities of gait and mobility (R26.89) Pain - Right/Left: Right Pain - part of body: Hip     Time: 1423-9532 PT Time Calculation (min) (ACUTE ONLY): 15 min  Charges:  $Therapeutic Activity: 8-22 mins                     Erasmo Leventhal , PTA Acute Rehabilitation Services Pager [phone] Office [phone]     Savvy Peeters Eli Hose 02/19/2020, 4:10 PM

## 2020-02-19 NOTE — Progress Notes (Signed)
Discussed with family and patient re: low bp  Received collateral info from daughter re: her normal routine meds  Confirmed with Dr. Stevie Kern phone]of EP Cards the plan initaited by him in OV Would hold d/c today, will increase her midodrine today and re-eval in am with routine regular orthostatics.   Might need formal cards input and maybe IVF if no better in am   Otherwise can d/c to SNF am  Verneita Griffes, MD [facility] 4:55 PM

## 2020-02-19 NOTE — TOC Transition Note (Signed)
Transition of Care [facility]) - CM/SW Discharge Note   Patient Details  Name: Bianca [name] MRN: [id number] Date of Birth: 1940-10-16  [facility] CM/SW Contact:  Curlene Labrum, RN Phone Number: 02/19/2020, 10:05 AM   Clinical Narrative:     Called and spoke with Seth Bake, CM at [facility] and patient is to be transferred to the facility this morning.  The primary nurse is to call report to [facility] at [phone] for Room 209.  The family will be notified prior to the patient's transfer.  Final next level of care: Skilled Nursing Facility Barriers to Discharge: Insurance Authorization   Patient Goals and CMS Choice Patient states their goals for this hospitalization and ongoing recovery are:: Plans to transfer to a [place] is first choice      Discharge Placement                       Discharge Plan and Services   Discharge Planning Services: CM Consult Post Acute Care Choice: [place]                               Social Determinants of Health (SDOH) Interventions     Readmission Risk Interventions No flowsheet data found.

## 2020-02-19 NOTE — TOC Transition Note (Signed)
Transition of Care [facility]) - CM/SW Discharge Note   Patient Details  Name: Bianca [name] MRN: [id number] Date of Birth: July 08, 1941  Transition [facility] Care [facility]) CM/SW Contact:  Curlene Labrum, RN Phone Number: 02/19/2020, 1:11 PM   Clinical Narrative:    Case management spoke with the physician, Dr. [name] and the patient will be delayed for discharge until tomorrow to [facility] due to a vagal episode.  The family is at the bedside and both the patient, [name] and the family are aware that the patient will discharge to [facility] tomorrow.   Final next level of care: Skilled Nursing Facility Barriers to Discharge: Insurance Authorization   Patient Goals and CMS Choice Patient states their goals for this hospitalization and ongoing recovery are:: Plans to transfer to a [place] is first choice      Discharge Placement                       Discharge Plan and Services   Discharge Planning Services: CM Consult Post Acute Care Choice: [place]                               Social Determinants of Health (SDOH) Interventions     Readmission Risk Interventions No flowsheet data found.

## 2020-02-20 ENCOUNTER — Encounter (HOSPITAL_COMMUNITY): Payer: Self-pay | Admitting: Internal Medicine

## 2020-02-20 DIAGNOSIS — I951 Orthostatic hypotension: Secondary | ICD-10-CM

## 2020-02-20 DIAGNOSIS — R55 Syncope and collapse: Secondary | ICD-10-CM

## 2020-02-20 LAB — RENAL FUNCTION PANEL
Albumin: 2.5 g/dL — ABNORMAL LOW (ref 3.5–5.0)
Anion gap: 12 (ref 5–15)
BUN: 14 mg/dL (ref 8–23)
CO2: 24 mmol/L (ref 22–32)
Calcium: 8.6 mg/dL — ABNORMAL LOW (ref 8.9–10.3)
Chloride: 103 mmol/L (ref 98–111)
Creatinine, Ser: 1.05 mg/dL — ABNORMAL HIGH (ref 0.44–1.00)
GFR calc Af Amer: 58 mL/min — ABNORMAL LOW (ref >60)
GFR calc non Af Amer: 50 mL/min — ABNORMAL LOW (ref >60)
Glucose, Bld: 100 mg/dL — ABNORMAL HIGH (ref 70–99)
Phosphorus: 4 mg/dL (ref 2.5–4.6)
Potassium: 3.9 mmol/L (ref 3.5–5.1)
Sodium: 139 mmol/L (ref 135–145)

## 2020-02-20 LAB — CBC WITH DIFFERENTIAL/PLATELET
Abs Immature Granulocytes: 0.03 10*3/uL (ref 0.00–0.07)
Basophils Absolute: 0 10*3/uL (ref 0.0–0.1)
Basophils Relative: 1 %
Eosinophils Absolute: 0.8 10*3/uL — ABNORMAL HIGH (ref 0.0–0.5)
Eosinophils Relative: 10 %
HCT: 27.4 % — ABNORMAL LOW (ref 36.0–46.0)
Hemoglobin: 8.5 g/dL — ABNORMAL LOW (ref 12.0–15.0)
Immature Granulocytes: 0 %
Lymphocytes Relative: 27 %
Lymphs Abs: 2.3 10*3/uL (ref 0.7–4.0)
MCH: 25.4 pg — ABNORMAL LOW (ref 26.0–34.0)
MCHC: 31 g/dL (ref 30.0–36.0)
MCV: 81.8 fL (ref 80.0–100.0)
Monocytes Absolute: 0.7 10*3/uL (ref 0.1–1.0)
Monocytes Relative: 9 %
Neutro Abs: 4.6 10*3/uL (ref 1.7–7.7)
Neutrophils Relative %: 53 %
Platelets: 547 10*3/uL — ABNORMAL HIGH (ref 150–400)
RBC: 3.35 MIL/uL — ABNORMAL LOW (ref 3.87–5.11)
RDW: 14.9 % (ref 11.5–15.5)
WBC: 8.5 10*3/uL (ref 4.0–10.5)
nRBC: 0 % (ref 0.0–0.2)

## 2020-02-20 LAB — GLUCOSE, CAPILLARY: Glucose-Capillary: 101 mg/dL — ABNORMAL HIGH (ref 70–99)

## 2020-02-20 MED ORDER — FLUDROCORTISONE ACETATE 0.1 MG PO TABS
0.2000 mg | ORAL_TABLET | Freq: Every day | ORAL | Status: DC
  Administered 2020-02-20: 0.2 mg via ORAL
  Filled 2020-02-20: qty 2

## 2020-02-20 MED ORDER — MIDODRINE HCL 5 MG PO TABS
10.0000 mg | ORAL_TABLET | Freq: Three times a day (TID) | ORAL | Status: DC
  Administered 2020-02-21: 10 mg via ORAL
  Filled 2020-02-20: qty 2

## 2020-02-20 MED ORDER — SODIUM CHLORIDE 0.9 % IV SOLN: INTRAVENOUS | Status: DC

## 2020-02-20 NOTE — Plan of Care (Signed)

## 2020-02-20 NOTE — Progress Notes (Signed)
Orthopedic Tech Progress Note Patient Details:  Bianca [name] [birth year] [id number]  Ortho Devices Type of Ortho Device: Abdominal binder Ortho Device/Splint Interventions: Ordered, Application, Adjustment   Post Interventions Patient Tolerated: Well Instructions Provided: Care of device, Poper ambulation with device, Adjustment of device   Khamya Topp 02/20/2020, 5:37 PM

## 2020-02-20 NOTE — Progress Notes (Signed)
Pt 's daughter at bedside refused to have her mother stand up for orthostatic vitals during the night as this would interrupt  much needed sleep. Daughter preferred to resume in the morning.

## 2020-02-20 NOTE — Progress Notes (Signed)
Please see amended discharge summary from 8/2  I saw the patient today she is doing fair and is not in pain However her daughter now relates to me that she has had severe postural hypotension for the past year Daughter tells me in addition that whenever she stands for extended periods the blood pressures drop I have relayed to both the patient and the daughter at that this is quite difficult to treat-they have trialed stockings in the past which I have asked the nurse to place an order for It is also reasonable to try Florinef in addition to the higher dose of midodrine that the patient is on I do not think that postural hypotension will be fixed but can be mitigated by slow and gradual ambulation in addition to a slow and gradual mobility at the facility and I will amend my discharge summary to state that  Otherwise unless cardiologist has any other strong opinions, I have no objection to the patient being discharged tomorrowto the facility I had a long discussion once again with the daughter who understands the care plan  Verneita Griffes, MD [facility] 4:21 PM

## 2020-02-20 NOTE — Progress Notes (Signed)
Nutrition Follow-up  RD working remotely.  DOCUMENTATION CODES:   Underweight, Severe malnutrition in context of chronic illness  INTERVENTION:   -Continue MVI with minerals daily -Continue Ensure Enlive po TID, each supplement provides 350 kcal and 20 grams of protein -Magic cup TID with meals, each supplement provides 290 kcal and 9 grams of protein  NUTRITION DIAGNOSIS:   Severe Malnutrition related to chronic illness (COPD) as evidenced by moderate fat depletion, severe fat depletion, moderate muscle depletion, severe muscle depletion.  Ongoing  GOAL:   Patient will meet greater than or equal to 90% of their needs  Progressing   MONITOR:   PO intake, Supplement acceptance, Labs, Weight trends, Skin, I & O's  REASON FOR ASSESSMENT:   Consult Assessment of nutrition requirement/status  ASSESSMENT:   Bianca [name] is a 79 y.o. female with medical history significant of hypertension, paroxysmal atrial fibrillation on Eliquis orthostatic hypotension, COPD, anxiety, history of breast cancer status postmastectomy, osteoporosis, and tobacco abuse who presents after having a fall.  7/27- s/p Right total hip arthroplasty through direct anterior approach.  Reviewed I/O's: -500 ml x 24 hours and -1.9 L since admission  UOP: 500 ml x 24 hours  Attempted to speak with pt via hospital room phone, however, no answer.   Noted meal completion 25-100%. Pt is intermittently accepting of Ensure supplements.   Per TOC team notes, plan to d/c to SNF once medically stable.  Labs reviewed.   Diet Order:   Diet Order            Diet - low sodium heart healthy           Diet regular Room service appropriate? Yes; Fluid consistency: Thin  Diet effective now                 EDUCATION NEEDS:   Education needs have been addressed  Skin:  Skin Assessment: Skin Integrity Issues: Skin Integrity Issues:: Incisions Incisions: closed rt hip  Last BM:  02/18/20  Height:    Ht Readings from Last 1 Encounters:  02/13/20 5\' 5"  (1.651 m)    Weight:   Wt Readings from Last 1 Encounters:  02/13/20 47.6 kg    Ideal Body Weight:  56.8 kg  BMI:  Body mass index is 17.47 kg/m.  Estimated Nutritional Needs:   Kcal:  1500-1700  Protein:  70-85 grams  Fluid:  > 1.5 L    Loistine Chance, RD, LDN, [place] Registered Dietitian II Certified Diabetes Care and Education Specialist Please refer to [facility] for RD and/or RD on-call/weekend/after hours pager

## 2020-02-20 NOTE — Progress Notes (Signed)
PROGRESS NOTE    Bianca [name]  LKG:[id number] DOB: [birth year] DOA: 02/11/2020 PCP: Trinna Post, PA-C  Brief Narrative:  79 year old white female Continued tobacco abuse COPD on inhalers Permanent A. fib CHADS2 score >4 on Eliquis as well as flecainide Orthostatic hypotension on midodrine CKD stage II-III Worsening protein energy malnutrition Recent diagnosis lung cancer being referred to [facility]  Admitted 02/11/2020 after fall tripping off the deck no LOC Recent weight loss noted by family-chest x-ray showed lung mass Orthopedics consulted and had total hip anterior arthroplasty 7/27  Was planning to discharge 8/2 however became severely hypotensive with mobility with therapy services and discharge delayed  Assessment & Plan:   Principal Problem:   Femoral neck fracture (HCC) Active Problems:   HYPERTENSION, BENIGN ESSENTIAL   COPD (chronic obstructive pulmonary disease) ([place])   Breast cancer of upper-outer quadrant of left female breast (HCC)   Tobacco abuse   AF (paroxysmal atrial fibrillation) (HCC)   Protein-calorie malnutrition, severe   1. Hip fracture status post repair 7/27 Dr. Ninfa Linden anterior hip arthroplasty right-sided a. Defer management to Ortho re: post-op management, WBAT currently and resumed Eliquis 2.5 twice daily b. Pain management Norco Vicodin and morphine in graded fashion-given confusion with pain meds c. Therapy services = SNF dispo 2. Permanent A. Fib CHADS2 score >4 a. Holding Cozaar-was on flecainide but is not on this now? b. Currently low-dose metoprolol XL 12.5-defer to cardiology planning with regards to dosing of the same c. Continue Eliquis 2.5 twice daily d. No changes today 3. Severe orthostasis-she has had orthostasis for about a year and it has been difficult to manage but has been exacerbated because of recent surgery and volume shifts a. Became quite orthostatic over 8/2 and 8/3 prompting a call to Dr. Lovena Le EP who knows her  well b. I formally consulted cardiology with recommendations from them to follow c. Currently midodrine is at 10 mg and I started Florinef earlier today 4. CKD 2-3 a. ARB discontinued this admission b. All meds as above 5.  Mild delirium a. Delirium is much improved compared to 7/28 b. SNF appropriate on discharge c. Continue BuSpar 15 twice daily Remeron 15 at bedtime and watch for sedation confusion 6. Protein energy malnutrition a. BMI 17-supplements as per dietary 7. Lung mass a. Supposed to have work-up for the same with PCP b.  Family has decided to follow-up at [facility] 8. Continued tobacco use with COPD a. Unlikely will quit smoking b. Will need further follow-up in the outpatient setting as above   DVT prophylaxis:Eliquis Code Status: This and full code Family Communication:  Long discussion with daughter at the bedside on 8 8/2 and 02/20/2020  disposition:   Status is: Inpatient  Remains inpatient appropriate because:Unsafe d/c plan and IV treatments appropriate due to intensity of illness or inability to take PO likely can discharge to facility on Monday pending  PAS SR number   Dispo:  Patient From: Home  Planned Disposition: [place]  Expected discharge date: 7/31 2021 orthopedics  Medically stable for discharge: Yes    Consultants:   Orthopedics  Procedures: Anterior hip repair  Antimicrobials: None   Subjective:  Blood pressure drops when she stands she feels symptomatic to some degree Otherwise she seems well Is eating some  Objective: Vitals:   02/20/20 0533 02/20/20 0751 02/20/20 0832 02/20/20 1647  BP: (!) 168/77 104/69  114/68  Pulse: 85 (!) 101  96  Resp:      Temp: 97.8 F (  36.6 C) 98.2 F (36.8 C)  99.5 F (37.5 C)  TempSrc: Oral Tympanic  Oral  SpO2:   97%   Weight:      Height:        Intake/Output Summary (Last 24 hours) at 02/20/2020 1722 Last data filed at 02/20/2020 1500 Gross per 24 hour  Intake 309.33 ml    Output 500 ml  Net -190.67 ml   Filed Weights   02/11/20 1041 02/13/20 1304  Weight: 47.6 kg 47.6 kg    Examination:  No specific change  General exam: EOMI NCAT Pleasant Respiratory system: Chest is clear without added sounds Cardiovascular system: O5-D6 holosystolic murmur appreciated Gastrointestinal system: Soft nontender no rebound no guarding no organomegaly slight distention Central nervous system: Intact no focal deficit Extremities: No edema Skin: Intact except where she had surgery Psychiatry: Euthymic   Data Reviewed: I have personally reviewed following labs and imaging studies Covid test is negative Hemoglobin 8.5 BUN/creatinine 40/1.0 No white count   Radiology Studies: No results found.   Scheduled Meds: . apixaban  2.5 mg Oral BID  . busPIRone  15 mg Oral BID  . cholecalciferol  2,000 Units Oral Daily  . feeding supplement (ENSURE ENLIVE)  237 mL Oral TID BM  . ferrous gluconate  324 mg Oral BID WC  . metoprolol succinate  12.5 mg Oral Daily  . midodrine  10 mg Oral QID  . mirtazapine  15 mg Oral QHS  . mometasone-formoterol  2 puff Inhalation BID  . multivitamin with minerals  1 tablet Oral Daily  . nicotine  7 mg Transdermal Daily  . senna  1 tablet Oral BID   Continuous Infusions: . sodium chloride 50 mL/hr at 02/20/20 0847     LOS: 9 days    Time spent: [place], MD [facility] To contact the attending provider between 7A-7P or the covering provider during after hours 7P-7A, please log into the web site [email or web address] and access using universal [place] password for that web site. If you do not have the password, please call the hospital operator.  02/20/2020, 5:22 PM

## 2020-02-20 NOTE — Consult Note (Signed)
Cardiology Consultation:   Patient ID: Bianca [name] MRN: [id number]; DOB: [birth year]  Admit date: 02/11/2020 Date of Consult: 02/20/2020  Primary Care Provider: Trinna Post, PA-C [facility] Cardiologist: Cristopher Peru, MD  [place] Electrophysiologist:  None    Patient Profile:   Bianca [name] is a 79 y.o. female with a history of paroxysmal atrial fibrillation on Eliquis, hypertension with orthostatic hypotension on Midodrine as needed at home, COPD, asthma, anxiety, tobacco abuse, and recently diagnosed lung mass concerning for cancer who is being seen today for the evaluation of orthostatis at the request of Dr/ Mims.  History of Present Illness:   Bianca [name] is a 79 year old female with the above history who is followed by Dr. Lovena Le for atrial fibrillation and orthostasis. She takes Midodrine as needed. Last Echo in 01/2019 showed LVEF of 60-65% with moderate LVH, grade 2 diastolic dysfunction, and moderate MR. Patient was last seen by Dr. Lovena Le in 09/2019 at which time she was doing relatively well. Denied any syncope.  Patient admitted on 02/11/2020 with right femoral neck fracture secondary to fall the day before. Patient was noted to be markedly hypertensive on arrival with BP as high as 192/97. Ortho was consulted and patient underwent right total hip arthroplasty on 02/13/2020. Patient had some delirium following surgery but otherwise unremarkable recovery. ARB was discontinue due to CKD stage II-III and Amlodipine 5mg  and Toprol-XL 12.5mg  daily were started. Patient was initially planned for discharge yesterday but had significant orthostasis with BP dropping from 117/67 when lying down to 70/52 with standing with associated near syncope.Therefore, Midodrine was increased to 10mg  four times daily and Amlodipine and Toprol were stopped. She remains orthostatic today with BP dropping from 173/84 when lying down to 104/69 with sitting. Cardiology consulted for further  evaluation.  At the time of this evaluation, patient resting comfortably in bed. She denies any pain at this time. Discussed events leading up to fall. She reports she was outside sitting on the deck with her daughter. She went to get up to walk inside and she states she just fell. She does not remember feeling having any prodromal symptoms (no palpitations, lightheadedness, dizziness) beforehand but thinks her BP probably dropped. However, she normal has symptoms of lightheadedness/dizziness when this happens. No LOC at the time. Patient did not want to go to the ED at that time. However, the next day she was having trouble walking and so family was planning on taking her to an Urgent Care but then she briefly passed out when getting up to walk so they brought her to the ED instead. No chest pain or shortness of breath. No edema. No recent fevers or illnesses. No abnormal bleeding on the Eliquis.  Past Medical History:  Diagnosis Date  . Anxiety   . Arthritis   . Asthma   . COPD (chronic obstructive pulmonary disease) ([place])   . HOH (hard of hearing)   . Hypertension   . Wears dentures    top and partial bottom  . Wears glasses     Past Surgical History:  Procedure Laterality Date  . APPENDECTOMY    . BREAST LUMPECTOMY WITH NEEDLE LOCALIZATION Right 04/21/2013   Procedure: BREAST LUMPECTOMY WITH NEEDLE LOCALIZATION;  Surgeon: Harl Bowie, MD;  Location: [place];  Service: General;  Laterality: Right;  . BREAST SURGERY  1985   cyst rt br  . MASTECTOMY W/ SENTINEL NODE BIOPSY Left 04/21/2013   Procedure: MASTECTOMY WITH SENTINEL LYMPH  NODE BIOPSY;  Surgeon: Harl Bowie, MD;  Location: [place];  Service: General;  Laterality: Left;  . OTHER SURGICAL HISTORY     cysts removed from foot  . OTHER SURGICAL HISTORY     cysts removed from breast  . 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   Procedure: TOTAL HIP ARTHROPLASTY ANTERIOR APPROACH;   Surgeon: Mcarthur Rossetti, MD;  Location: [place];  Service: Orthopedics;  Laterality: Right;  . TUBAL LIGATION       Home Medications:  Prior to Admission medications   Medication Sig Start Date End Date Taking? Authorizing Provider  acetaminophen (TYLENOL) 500 MG tablet Take 1,000 mg by mouth every 6 (six) hours as needed for headache (pain).   Yes [provider]  ADVAIR DISKUS 100-50 MCG/DOSE AEPB INHALE 1 PUFF TWICE A DAY AS DIRECTED **RINSE MOUTH AFTER USE** Patient taking differently: Inhale 1 puff into the lungs in the morning and at bedtime. Rinse mouth after use 10/20/19  Yes Pollak, Adriana M, PA-C  apixaban (ELIQUIS) 2.5 MG TABS tablet Take 1 tablet (2.5 mg total) by mouth 2 (two) times daily. 05/04/19  Yes Evans Lance, MD  Calcium Carb-Cholecalciferol (CALCIUM+D3 PO) Take 1 tablet by mouth daily.   Yes [provider]  Cholecalciferol (VITAMIN D3) 50 MCG (2000 UT) TABS Take 2,000 Units by mouth daily.    Yes [provider]  denosumab (PROLIA) 60 MG/ML SOSY injection Inject 60 mg into the skin every 6 (six) months.   Yes [provider]  FEXOFENADINE HCL PO Take 1 tablet by mouth daily.    Yes [provider]  lactose free nutrition (BOOST) LIQD Take 237 mLs by mouth 2 (two) times daily between meals.   Yes [provider]  losartan (COZAAR) 25 MG tablet Take 1/2 tablet as needed for systolic blood pressure over 180. Patient taking differently: Take 12.5 mg by mouth daily as needed (SBP >180).  06/26/19  Yes Evans Lance, MD  midodrine (PROAMATINE) 5 MG tablet Take one tablet by mouth as needed for dizziness/lightheadedness.  May take up to 4 tablets in a 24 hour period. Patient taking differently: Take 5 mg by mouth daily as needed (dizziness/lightheadedness).  11/08/19  Yes Evans Lance, MD  PROAIR HFA 108 [phone] Base) MCG/ACT inhaler INHALE 1-2 PUFFS INTO THE LUNGS EVERY 4 HOURS AS NEEDED Patient taking differently:  Inhale 1-2 puffs into the lungs every 4 (four) hours as needed for wheezing or shortness of breath.  07/26/17  Yes Lucille Passy, MD  vitamin C (ASCORBIC ACID) 500 MG tablet Take 500 mg by mouth daily.    Yes [provider]  busPIRone (BUSPAR) 15 MG tablet Take 1 tablet (15 mg total) by mouth 2 (two) times daily. 02/19/20   Nita Sells, MD  ferrous gluconate (FERGON) 324 MG tablet Take 1 tablet (324 mg total) by mouth 2 (two) times daily with a meal. 02/19/20   Nita Sells, MD  HYDROcodone-acetaminophen (NORCO/VICODIN) 5-325 MG tablet Take 1 tablet by mouth every 4 (four) hours as needed for moderate pain. 02/15/20 02/14/21  Mcarthur Rossetti, MD  methocarbamol (ROBAXIN) 500 MG tablet Take 1 tablet (500 mg total) by mouth every 6 (six) hours as needed for muscle spasms. 02/19/20   Nita Sells, MD  metoprolol succinate (TOPROL-XL) 25 MG 24 hr tablet Take 0.5 tablets (12.5 mg total) by mouth daily. 02/19/20   Nita Sells, MD  mirtazapine (REMERON) 15 MG tablet Take 1 tablet (  15 mg total) by mouth at bedtime. 02/19/20   Nita Sells, MD  senna (SENOKOT) 8.6 MG TABS tablet Take 1 tablet (8.6 mg total) by mouth 2 (two) times daily. 02/19/20   Nita Sells, MD  UNABLE TO FIND Rx: [phone]- Post Surgical Bras (Quantity: 6)\ Dx: 174.9; Left mastectomy 05/29/13   Coralie Keens, MD    Inpatient Medications: Scheduled Meds: . apixaban  2.5 mg Oral BID  . busPIRone  15 mg Oral BID  . cholecalciferol  2,000 Units Oral Daily  . feeding supplement (ENSURE ENLIVE)  237 mL Oral TID BM  . ferrous gluconate  324 mg Oral BID WC  . metoprolol succinate  12.5 mg Oral Daily  . midodrine  10 mg Oral QID  . mirtazapine  15 mg Oral QHS  . mometasone-formoterol  2 puff Inhalation BID  . multivitamin with minerals  1 tablet Oral Daily  . nicotine  7 mg Transdermal Daily  . senna  1 tablet Oral BID   Continuous Infusions: . sodium chloride 50 mL/hr at 02/20/20  0847   PRN Meds: acetaminophen, acetaminophen, albuterol, HYDROcodone-acetaminophen, HYDROcodone-acetaminophen, menthol-cetylpyridinium **OR** phenol, methocarbamol **OR** [DISCONTINUED] methocarbamol (ROBAXIN) IV, morphine injection, ondansetron **OR** ondansetron (ZOFRAN) IV  Allergies:    Allergies  Allergen Reactions  . Xanax [Alprazolam] Other (See Comments)    combative    Social History:   Social History   Socioeconomic History  . Marital status: Widowed    Spouse name: Not on file  . Number of children: 2  . Years of education: Not on file  . Highest education level: Some college, no degree  Occupational History  . Occupation: retired    Fish farm manager: RETIRED  Tobacco Use  . Smoking status: Current Every Day Smoker    Packs/day: 0.25    Types: Cigarettes  . Smokeless tobacco: Never Used  Substance and Sexual Activity  . Alcohol use: No  . Drug use: No  . Sexual activity: Not on file    Comment: 1-2 daily  Other Topics Concern  . Not on file  Social History Narrative   Has a living will   Social Determinants of Health   Financial Resource Strain: Low Risk   . Difficulty of Paying Living Expenses: Not hard at all  Food Insecurity: No Food Insecurity  . Worried About Charity fundraiser in the Last Year: Never true  . Ran Out of Food in the Last Year: Never true  Transportation Needs: No Transportation Needs  . Lack of Transportation (Medical): No  . Lack of Transportation (Non-Medical): No  Physical Activity: Inactive  . Days of Exercise per Week: 0 days  . Minutes of Exercise per Session: 0 min  Stress: No Stress Concern Present  . Feeling of Stress : Not at all  Social Connections: Socially Isolated  . Frequency of Communication with Friends and Family: More than three times a week  . Frequency of Social Gatherings with Friends and Family: More than three times a week  . Attends Religious Services: Never  . Active Member of Clubs or Organizations: No  .  Attends Archivist Meetings: Never  . Marital Status: Widowed  Intimate Partner Violence: Not At Risk  . Fear of Current or Ex-Partner: No  . Emotionally Abused: No  . Physically Abused: No  . Sexually Abused: No    Family History:   Family History  Problem Relation Age of Onset  . CAD Mother        possible small  heart attack  . Thyroid disease Sister        she thinks hyperthyroidism     ROS:  Please see the history of present illness.  All other ROS reviewed and negative.     Physical Exam/Data:   Vitals:   02/20/20 0533 02/20/20 0751 02/20/20 0832 02/20/20 1647  BP: (!) 168/77 104/69  114/68  Pulse: 85 (!) 101  96  Resp:      Temp: 97.8 F (36.6 C) 98.2 F (36.8 C)  99.5 F (37.5 C)  TempSrc: Oral Tympanic  Oral  SpO2:   97%   Weight:      Height:        Intake/Output Summary (Last 24 hours) at 02/20/2020 1652 Last data filed at 02/20/2020 1500 Gross per 24 hour  Intake 309.33 ml  Output 500 ml  Net -190.67 ml   Last 3 Weights 02/13/2020 02/11/2020 01/30/2020  Weight (lbs) 105 lb 105 lb 105 lb  Weight (kg) 47.628 kg 47.628 kg 47.628 kg     Body mass index is 17.47 kg/m.  General: 79 y.o. female resting comfortably in no acute distress. HEENT: Normocephalic and atraumatic. Sclera clear. EOMs intact. Neck: Supple. No JVD. Heart: RRR. Distinct S1 and S2. No murmurs, gallops, or rubs. Radial and distal pedal pulses 2+ and equal bilaterally. Lungs: No increased work of breathing. Mild rhonchi (may be upper airway noises). No significant wheezes or crackles.  Abdomen: Soft, non-distended, and non-tender to palpation. Bowel sounds present in all 4 quadrants.  Extremities: No lower extremity edema.    Skin: Warm and dry. Neuro: No focal deficits. Psych: Normal affect. Responds appropriately.  EKG:  The EKG was personally reviewed and demonstrates: Normal sinus rhythm, rate 99 bpm, with LVH and non-specific ST/T changes.   Telemetry:  Telemetry was  personally reviewed and demonstrates:  Patient not on telemetry.  Relevant CV Studies:  Echocardiogram 01/22/2019: Impressions: 1. The left ventricle has normal systolic function with an ejection  fraction of 60-65%. The cavity size was normal. There is moderate  concentric left ventricular hypertrophy. Left ventricular diastolic  Doppler parameters are consistent with  pseudonormalization. Elevated left atrial and left ventricular  end-diastolic pressures.  2. The right ventricle has normal systolic function. The cavity was  normal. There is no increase in right ventricular wall thickness. Right  ventricular systolic pressure is moderately elevated.  3. Mitral valve regurgitation is moderate by color flow Doppler.  4. The aortic valve is tricuspid. Moderate thickening of the aortic  valve. Moderate calcification of the aortic valve. Aortic valve  regurgitation was not assessed by color flow Doppler.  5. The inferior vena cava was dilated in size with <50% respiratory  variability.   Laboratory Data:  High Sensitivity Troponin:  No results for input(s): TROPONINIHS in the last 720 hours.   Chemistry Recent Labs  Lab 02/16/20 1056 02/18/20 0353 02/20/20 0326  NA 139 139 139  K 3.8 3.9 3.9  CL 110 108 103  CO2 22 24 24   GLUCOSE 104* 97 100*  BUN 13 12 14   CREATININE 1.04* 1.07* 1.05*  CALCIUM 7.9* 8.3* 8.6*  GFRNONAA 51* NOT CALCULATED 50*  GFRAA 59* NOT CALCULATED 58*  ANIONGAP 7 7 12     Recent Labs  Lab 02/20/20 0326  ALBUMIN 2.5*   Hematology Recent Labs  Lab 02/16/20 1056 02/18/20 0353 02/20/20 0823  WBC 7.8 9.2 8.5  RBC 3.09* 3.37* 3.35*  HGB 8.0* 8.5* 8.5*  HCT 25.3* 27.7* 27.4*  MCV  81.9 82.2 81.8  MCH 25.9* 25.2* 25.4*  MCHC 31.6 30.7 31.0  RDW 15.1 15.2 14.9  PLT 349 432* 547*   BNPNo results for input(s): BNP, PROBNP in the last 168 hours.  DDimer No results for input(s): DDIMER in the last 168 hours.   Radiology/Studies:  No results  found.   Assessment and Plan:   Orthostatic Hypotension - Positive orthostatics today despite increase in Midodrine. BP dropped from  173/84 when lying down to 104/69 with sitting.  - Started on Florinef 0.2mg  daily today in addition to Midodrine 10mg  every 6 hours. - Discussed with Dr. Radford Pax. Will stop Florinef. OK to continue Midodrine for now but she is on a high dose. Will try abdominal binder and thigh level compression stockings. Will recheck orthostatic vital signs tomorrow with abdominal binder and thigh high compression stockings in place. - Will check AM cortisol level. - Will also get carotid dopplers tomorrow. - May ultimately need Northera.  Paroxysmal Atrial Fibrillation - Patient not on telemetry but regular rhythm on exam. Rates in the 80's to low 100's.  - Will not add beta-blocker at this time given significant orthostatic hypotension. - Continue low dose Eliquis 2.5mg  twice daily.  Otherwise, per primary team: - Right hip fracture s/p total hip replacement - Mild delirium - Lung mass - COPD with continued tobacco use - Malnutrition   For questions or updates, please contact [place] Please consult [email or web address] for contact info under    Signed, Darreld Mclean, PA-C  02/20/2020 4:52 PM

## 2020-02-21 ENCOUNTER — Inpatient Hospital Stay (HOSPITAL_COMMUNITY): Payer: PPO

## 2020-02-21 DIAGNOSIS — E43 Unspecified severe protein-calorie malnutrition: Secondary | ICD-10-CM

## 2020-02-21 DIAGNOSIS — S72001A Fracture of unspecified part of neck of right femur, initial encounter for closed fracture: Secondary | ICD-10-CM

## 2020-02-21 DIAGNOSIS — I951 Orthostatic hypotension: Secondary | ICD-10-CM

## 2020-02-21 LAB — BASIC METABOLIC PANEL
Anion gap: 9 (ref 5–15)
BUN: 14 mg/dL (ref 8–23)
CO2: 25 mmol/L (ref 22–32)
Calcium: 8.3 mg/dL — ABNORMAL LOW (ref 8.9–10.3)
Chloride: 107 mmol/L (ref 98–111)
Creatinine, Ser: 1 mg/dL (ref 0.44–1.00)
GFR calc Af Amer: >60 mL/min (ref >60)
GFR calc non Af Amer: 53 mL/min — ABNORMAL LOW (ref >60)
Glucose, Bld: 91 mg/dL (ref 70–99)
Potassium: 3.8 mmol/L (ref 3.5–5.1)
Sodium: 141 mmol/L (ref 135–145)

## 2020-02-21 LAB — CBC WITH DIFFERENTIAL/PLATELET
Abs Immature Granulocytes: 0.03 10*3/uL (ref 0.00–0.07)
Basophils Absolute: 0.1 10*3/uL (ref 0.0–0.1)
Basophils Relative: 1 %
Eosinophils Absolute: 0.8 10*3/uL — ABNORMAL HIGH (ref 0.0–0.5)
Eosinophils Relative: 8 %
HCT: 27.3 % — ABNORMAL LOW (ref 36.0–46.0)
Hemoglobin: 8.6 g/dL — ABNORMAL LOW (ref 12.0–15.0)
Immature Granulocytes: 0 %
Lymphocytes Relative: 25 %
Lymphs Abs: 2.5 10*3/uL (ref 0.7–4.0)
MCH: 26.1 pg (ref 26.0–34.0)
MCHC: 31.5 g/dL (ref 30.0–36.0)
MCV: 82.7 fL (ref 80.0–100.0)
Monocytes Absolute: 0.9 10*3/uL (ref 0.1–1.0)
Monocytes Relative: 9 %
Neutro Abs: 5.9 10*3/uL (ref 1.7–7.7)
Neutrophils Relative %: 57 %
Platelets: 598 10*3/uL — ABNORMAL HIGH (ref 150–400)
RBC: 3.3 MIL/uL — ABNORMAL LOW (ref 3.87–5.11)
RDW: 15.1 % (ref 11.5–15.5)
WBC: 10 10*3/uL (ref 4.0–10.5)
nRBC: 0 % (ref 0.0–0.2)

## 2020-02-21 LAB — CORTISOL-AM, BLOOD: Cortisol - AM: 10.1 ug/dL (ref 6.7–22.6)

## 2020-02-21 MED ORDER — PYRIDOSTIGMINE BROMIDE 60 MG PO TABS
60.0000 mg | ORAL_TABLET | Freq: Every day | ORAL | Status: DC
  Filled 2020-02-21: qty 1

## 2020-02-21 MED ORDER — PYRIDOSTIGMINE BROMIDE 60 MG PO TABS
30.0000 mg | ORAL_TABLET | Freq: Two times a day (BID) | ORAL | Status: DC
  Administered 2020-02-21 – 2020-02-23 (×5): 30 mg via ORAL
  Filled 2020-02-21 (×6): qty 0.5

## 2020-02-21 MED ORDER — MIDODRINE HCL 5 MG PO TABS
5.0000 mg | ORAL_TABLET | Freq: Three times a day (TID) | ORAL | Status: DC
  Administered 2020-02-21 – 2020-02-22 (×3): 5 mg via ORAL
  Filled 2020-02-21 (×4): qty 1

## 2020-02-21 NOTE — Progress Notes (Signed)
Physical Therapy Treatment Patient Details Name: Bianca [name] MRN: [id number] DOB: 12/09/1940 Today's Date: 02/21/2020    History of Present Illness Pt is an 80 yr old female admitted on 02/11/20 with hip fx after fall tripping on deck hitting head on chair and requiring R THA direct anterior approach on 02/13/20. CT head negative. Age-indeterminate mild T3 superior endplate compression fracture. C-Spine: negative. CT abdmomen: LLL lung mass, trace pleural effusion. Of note, pt with cough x4 weeks and weight loss of 10# in a few months. PMHx:  atrial fibrillation, anxiety, COPD, arthritis, h/o breast CA s/p mastectomy.    PT Comments    Pt supine in bed on arrival this session.  She was orthostatic this am.  Daughter at bedside and continues to be concerned with drops in BP.  Focused on RLE exercises to strengthen R hip.  Pt performed another set of orthostatic vitals and continues to drop significantly. Continue to recommend snf placement.   Orthostatics: 156/85 lying 123/69 sitting 130/75 sitting after 3 min.  91/50 standing ( near syncope ) 115/69 supine after return to bed  Follow Up Recommendations  SNF     Equipment Recommendations  Rolling walker with 5" wheels    Recommendations for Other Services       Precautions / Restrictions Precautions Precautions: Fall Precaution Comments: x6 times in past 6 months, watch BP (orthostatic hypotension) Restrictions Weight Bearing Restrictions: Yes RLE Weight Bearing: Weight bearing as tolerated    Mobility  Bed Mobility Overal bed mobility: Needs Assistance Bed Mobility: Supine to Sit;Sit to Supine     Supine to sit: Min assist Sit to supine: Mod assist   General bed mobility comments: Pt supine supine in bed on arrival required min assistance to move to edge of bed, and mod assistance to move back to bed when actively dizzy and near syncope.  Transfers Overall transfer level: Needs assistance Equipment used: Rolling  walker (2 wheeled) Transfers: Sit to/from Stand Sit to Stand: Min assist         General transfer comment: min assistance to rise from edge of bed but standing trials shortened due to dizziness.  Ambulation/Gait Ambulation/Gait assistance:  (unable .)               Stairs             Wheelchair Mobility    Modified Rankin (Stroke Patients Only)       Balance Overall balance assessment: History of Falls;Needs assistance Sitting-balance support: Feet supported Sitting balance-Leahy Scale: Fair       Standing balance-Leahy Scale: Poor                              Cognition Arousal/Alertness: Awake/alert Behavior During Therapy: WFL for tasks assessed/performed Overall Cognitive Status: Impaired/Different from baseline Area of Impairment: Problem solving;Safety/judgement                 Orientation Level: Situation Current Attention Level: Selective Memory: Decreased recall of precautions;Decreased short-term memory Following Commands: Follows one step commands with increased time Safety/Judgement: Decreased awareness of safety;Decreased awareness of deficits Awareness: Intellectual Problem Solving: Slow processing General Comments: Pt requires cues for sequencing through task.      Exercises Total Joint Exercises Ankle Circles/Pumps: AROM;Both;Supine;10 reps Quad Sets: AROM;Right;10 reps;Supine Heel Slides: AAROM;Right;10 reps;Supine;AROM Hip ABduction/ADduction: AAROM;Right;10 reps;Supine Straight Leg Raises: AROM;AAROM;Right;10 reps;Supine    General Comments        Pertinent Vitals/Pain  Pain Assessment: Faces Faces Pain Scale: Hurts a little bit Pain Location: R hip and leg Pain Descriptors / Indicators: Sore Pain Intervention(s): Monitored during session;Repositioned    Home Living                      Prior Function            PT Goals (current goals can now be found in the care plan section) Acute  Rehab PT Goals Patient Stated Goal: to go to SNF Potential to Achieve Goals: Good Progress towards PT goals: Progressing toward goals    Frequency    Min 3X/week      PT Plan Current plan remains appropriate    Co-evaluation              AM-PAC PT "6 Clicks" Mobility   Outcome Measure  Help needed turning from your back to your side while in a flat bed without using bedrails?: A Little Help needed moving from lying on your back to sitting on the side of a flat bed without using bedrails?: A Little Help needed moving to and from a bed to a chair (including a wheelchair)?: A Little Help needed standing up from a chair using your arms (e.g., wheelchair or bedside chair)?: A Little Help needed to walk in hospital room?: A Lot Help needed climbing 3-5 steps with a railing? : Total 6 Click Score: 15    End of Session Equipment Utilized During Treatment: Gait belt Activity Tolerance: Treatment limited secondary to medical complications (Comment) Patient left: in chair;with call bell/phone within reach;with family/visitor present Nurse Communication: Mobility status PT Visit Diagnosis: Unsteadiness on feet (R26.81);Pain;Difficulty in walking, not elsewhere classified (R26.2);Other abnormalities of gait and mobility (R26.89) Pain - Right/Left: Right Pain - part of body: Hip     Time: 5697-9480 PT Time Calculation (min) (ACUTE ONLY): 33 min  Charges:  $Therapeutic Exercise: 8-22 mins $Therapeutic Activity: 8-22 mins                     Erasmo Leventhal , PTA Acute Rehabilitation Services Pager [phone] Office [phone]     Alwin Lanigan Eli Hose 02/21/2020, 4:18 PM

## 2020-02-21 NOTE — Progress Notes (Addendum)
Partial orthostatic vital signs completed, unable to do standing VS, patient became dizzy and had to sit down, did not get VS.  Paged PA Sarajane Jews.  Patient had abdominal binder and thigh high compression stockings on.  Awaiting call back from PA.  PA Gilbert notified, no new orders.

## 2020-02-21 NOTE — Progress Notes (Signed)
PROGRESS NOTE    Bianca [name]  GGY:[id number] DOB: 25-Sep-1940 DOA: 02/11/2020 PCP: Trinna Post, PA-C  Brief Narrative:  79 year old white female Continued tobacco abuse COPD on inhalers Permanent A. fib CHADS2 score >4 on Eliquis as well as flecainide Orthostatic hypotension on midodrine CKD stage II-III Worsening protein energy malnutrition Recent diagnosis lung cancer being referred to [facility]  Admitted 02/11/2020 after fall tripping off the deck no LOC Recent weight loss noted by family-chest x-ray showed lung mass Orthopedics consulted and had total hip anterior arthroplasty 7/27  Was planning to discharge 8/2 however became severely hypotensive with mobility with therapy services and discharge delayed  Assessment & Plan:   Principal Problem:   Femoral neck fracture (HCC) Active Problems:   HYPERTENSION, BENIGN ESSENTIAL   COPD (chronic obstructive pulmonary disease) ([place])   Breast cancer of upper-outer quadrant of left female breast (HCC)   Tobacco abuse   AF (paroxysmal atrial fibrillation) (HCC)   Protein-calorie malnutrition, severe   Orthostatic hypotension   Syncope and collapse   Severe orthostasis acute on chronic symptomatic -Reports 1 year of orthostatic hypotension but not this profound  -Likely exacerbated by recent surgery, prolonged bedbound status and medication changes  -Orthostatic vital signs continue to be quite remarkable, systolics dropping from 035 while supine to 80 while standing with profound symptoms of lightheadedness and dizziness - Cardiology continues to follow appreciate insight and recommendations - Currently titrating patient on to midodrine  Hip fracture status post repair 7/27, POA Dr. Ninfa Linden anterior hip arthroplasty right-sided Defer management to Ortho re: post-op management, WBAT currently and resumed Eliquis 2.5 twice daily Attempt to wean off narcotic medications, this will likely improve patient's symptoms as above PT  recommending SNF placement, currently stable for discharge other than orthostatic hypotension as above  Permanent A. Fib CHADS2 score >4 Currently holding Cozaar, previously on flecainide, defer to cardiology as above -currently holding metoprolol as well Continue Eliquis 2.5 twice daily given advanced age and low BMI  CKD 2-3 ARB discontinued this admission All meds as above  Mild delirium, resolving Improving daily, continue to wean off narcotics as above Continue BuSpar 15 twice daily Remeron 15 at bedtime and watch for sedation confusion/sundowning/delirium  Moderate protein caloric malnutrition BMI 17-supplements as per dietary  Lung mass, unspecified In the setting of ongoing tobacco abuse  Supposed to have work-up for the same with PCP/[facility] per family  Continued tobacco use with COPD Unlikely will quit smoking despite lengthy conversation at bedside daily Will need further follow-up in the outpatient setting as above   DVT prophylaxis:Eliquis Code Status: Full code Family Communication:  None present, previously updated yesterday on the 02/20/2020  disposition:   Status is: Inpatient  Remains inpatient appropriate because:Unsafe d/c plan and IV treatments appropriate due to intensity of illness or inability to take PO currently unable to discharge given ongoing profound orthostatic hypotension with remarkable symptoms.   Dispo:  Patient From: Home  Planned Disposition: [place]  Expected discharge date: 7/31 2021 orthopedics  Medically stable for discharge: No    Consultants:   Orthopedics  Procedures: Anterior hip repair  Antimicrobials: None   Subjective:  No acute issues or events overnight, patient's blood pressure continues to drop and she remains symptomatic with any ambulation, denies nausea, vomiting, diarrhea, constipation, headache, fevers, chills.  Objective: Vitals:   02/20/20 1647 02/20/20 1941 02/20/20 2059 02/21/20 0328    BP: 114/68 (!) 170/74  (!) 175/83  Pulse: 96 95  92  Resp:  18  15  Temp: 99.5 F (37.5 C) 98.4 F (36.9 C)  98.1 F (36.7 C)  TempSrc: Oral Oral  Oral  SpO2:  97% 96% 97%  Weight:      Height:        Intake/Output Summary (Last 24 hours) at 02/21/2020 0829 Last data filed at 02/21/2020 0500 Gross per 24 hour  Intake 309.33 ml  Output 250 ml  Net 59.33 ml   Filed Weights   02/11/20 1041 02/13/20 1304  Weight: 47.6 kg 47.6 kg    Examination:  General:  Pleasantly resting in bed, No acute distress. HEENT:  Normocephalic atraumatic.  Sclerae nonicteric, noninjected.  Extraocular movements intact bilaterally. Neck:  Without mass or deformity.  Trachea is midline. Lungs:  Clear to auscultate bilaterally without rhonchi, wheeze, or rales. Heart:  Regular rate and rhythm.  Without murmurs, rubs, or gallops. Abdomen:  Soft, nontender, nondistended.  Without guarding or rebound. Extremities: Right lower extremity range of motion limited by pain and weakness, otherwise 5 out of 5 strength. Vascular:  Dorsalis pedis and posterior tibial pulses palpable bilaterally. Skin: Bandage clean dry intact over right hip   Data Reviewed: I have personally reviewed following labs and imaging studies Covid test is negative Hemoglobin 8.5 BUN/creatinine 40/1.0 No white count   Radiology Studies: No results found.   Scheduled Meds: . apixaban  2.5 mg Oral BID  . busPIRone  15 mg Oral BID  . cholecalciferol  2,000 Units Oral Daily  . feeding supplement (ENSURE ENLIVE)  237 mL Oral TID BM  . ferrous gluconate  324 mg Oral BID WC  . midodrine  10 mg Oral TID WC  . mirtazapine  15 mg Oral QHS  . mometasone-formoterol  2 puff Inhalation BID  . multivitamin with minerals  1 tablet Oral Daily  . nicotine  7 mg Transdermal Daily  . senna  1 tablet Oral BID   Continuous Infusions: . sodium chloride 50 mL/hr at 02/20/20 0847     LOS: 10 days    Time spent: [place],  DO [facility] To contact the attending provider between 7A-7P or the covering provider during after hours 7P-7A, please log into the web site [email or web address] and access using universal [place] password for that web site. If you do not have the password, please call the hospital operator.  02/21/2020, 8:29 AM

## 2020-02-21 NOTE — TOC Progression Note (Addendum)
Transition of Care [facility]) - Progression Note    Patient Details  Name: Bianca [name] MRN: [id number] Date of Birth: 03/09/1941  Transition of Care [facility]) CM/SW [place], RN Phone Number: 02/21/2020, 1:44 PM  Clinical Narrative:    Case Management called and spoke with [facility] and [employer] to update them on the patient's progress.  The patient has insurance authorization approval through Friday 02/23/20 at 5 pm - after that a new insurance auth can be started of course.  I will continue to follow the physician notes regarding patient's potential to transfer once she is medically stable to do so.   Expected Discharge Plan: Skilled Nursing Facility Barriers to Discharge: Insurance Authorization  Expected Discharge Plan and Services Expected Discharge Plan: [place]   Discharge Planning Services: CM Consult Post Acute Care Choice: [place] Living arrangements for the past 2 months: Single Family Home Expected Discharge Date: 02/19/20                                     Social Determinants of Health (SDOH) Interventions    Readmission Risk Interventions No flowsheet data found.

## 2020-02-21 NOTE — Progress Notes (Signed)
Progress Note  Patient Name: Bianca [name] Date of Encounter: 02/21/2020  [facility] Cardiologist: Cristopher Peru, MD   Subjective   No acute overnight events. Patient feeling good this morning. No lightheadedness, dizziness, or recurrent syncope but she has not been up today. No chest pain or shortness of breath.  Inpatient Medications    Scheduled Meds: . apixaban  2.5 mg Oral BID  . busPIRone  15 mg Oral BID  . cholecalciferol  2,000 Units Oral Daily  . feeding supplement (ENSURE ENLIVE)  237 mL Oral TID BM  . ferrous gluconate  324 mg Oral BID WC  . midodrine  10 mg Oral TID WC  . mirtazapine  15 mg Oral QHS  . mometasone-formoterol  2 puff Inhalation BID  . multivitamin with minerals  1 tablet Oral Daily  . nicotine  7 mg Transdermal Daily  . senna  1 tablet Oral BID   Continuous Infusions: . sodium chloride 50 mL/hr at 02/20/20 0847   PRN Meds: acetaminophen, acetaminophen, albuterol, menthol-cetylpyridinium **OR** phenol, methocarbamol **OR** [DISCONTINUED] methocarbamol (ROBAXIN) IV, ondansetron **OR** ondansetron (ZOFRAN) IV   Vital Signs    Vitals:   02/20/20 1647 02/20/20 1941 02/20/20 2059 02/21/20 0328  BP: 114/68 (!) 170/74  (!) 175/83  Pulse: 96 95  92  Resp:  18  15  Temp: 99.5 F (37.5 C) 98.4 F (36.9 C)  98.1 F (36.7 C)  TempSrc: Oral Oral  Oral  SpO2:  97% 96% 97%  Weight:      Height:        Intake/Output Summary (Last 24 hours) at 02/21/2020 0953 Last data filed at 02/21/2020 0500 Gross per 24 hour  Intake 309.33 ml  Output 250 ml  Net 59.33 ml   Last 3 Weights 02/13/2020 02/11/2020 01/30/2020  Weight (lbs) 105 lb 105 lb 105 lb  Weight (kg) 47.628 kg 47.628 kg 47.628 kg      Telemetry    Not on telemetry.  ECG    No new ECG tracing today. - Personally Reviewed  Physical Exam   GEN: No acute distress.   Neck: Supple. No JVD Cardiac: RRR. No murmurs, rubs, or gallops.  Respiratory: Clear to auscultation bilaterally. GI:  Soft, non-tender, non-distended  MS: No lower extremity edema. No deformity. Skin: Warm and dry. Neuro:  No focal deficits. Psych: Normal affect. Responds appropriately.  Labs    High Sensitivity Troponin:  No results for input(s): TROPONINIHS in the last 720 hours.    Chemistry Recent Labs  Lab 02/18/20 0353 02/20/20 0326 02/21/20 0249  NA 139 139 141  K 3.9 3.9 3.8  CL 108 103 107  CO2 24 24 25   GLUCOSE 97 100* 91  BUN 12 14 14   CREATININE 1.07* 1.05* 1.00  CALCIUM 8.3* 8.6* 8.3*  ALBUMIN  --  2.5*  --   GFRNONAA NOT CALCULATED 50* 53*  GFRAA NOT CALCULATED 58* >60  ANIONGAP 7 12 9      Hematology Recent Labs  Lab 02/18/20 0353 02/20/20 0823 02/21/20 0249  WBC 9.2 8.5 10.0  RBC 3.37* 3.35* 3.30*  HGB 8.5* 8.5* 8.6*  HCT 27.7* 27.4* 27.3*  MCV 82.2 81.8 82.7  MCH 25.2* 25.4* 26.1  MCHC 30.7 31.0 31.5  RDW 15.2 14.9 15.1  PLT 432* 547* 598*    BNPNo results for input(s): BNP, PROBNP in the last 168 hours.   DDimer No results for input(s): DDIMER in the last 168 hours.   Radiology    No  results found.  Cardiac Studies   Echocardiogram 01/22/2019: Impressions: 1. The left ventricle has normal systolic function with an ejection  fraction of 60-65%. The cavity size was normal. There is moderate  concentric left ventricular hypertrophy. Left ventricular diastolic  Doppler parameters are consistent with  pseudonormalization. Elevated left atrial and left ventricular  end-diastolic pressures.  2. The right ventricle has normal systolic function. The cavity was  normal. There is no increase in right ventricular wall thickness. Right  ventricular systolic pressure is moderately elevated.  3. Mitral valve regurgitation is moderate by color flow Doppler.  4. The aortic valve is tricuspid. Moderate thickening of the aortic  valve. Moderate calcification of the aortic valve. Aortic valve  regurgitation was not assessed by color flow Doppler.  5. The  inferior vena cava was dilated in size with <50% respiratory  variability.   Patient Profile     AIREANA RYLAND is a 79 y.o. female with a history of paroxysmal atrial fibrillation on Eliquis, hypertension with orthostatic hypotension on Midodrine as needed at home, COPD, asthma, anxiety, tobacco abuse, and recently diagnosed lung mass concerning for cancer who is being seen today for the evaluation of orthostatis at the request of Dr/ Bemus Point.  Assessment & Plan    Orthostatic Hypotension  - Positive orthostatic the last 2 days with systolic BP dropping as much as 60 mmHg going from lying to sitting.  - Florinef stopped yesterday and abdominal binder and thigh high compression stockings placed.  - Continue Midodrine 10mg  TID (7am, 12pm, and 5pm to avoid supine hypertension at night). - AM Cortisol level normal. - Plan is for carotid ultrasounds today.  - Will recheck orthostatic vitals signs today with compression stocking and abdominal binder in place.  Paroxysmal Atrial Fibrillation - Patient not on telemetry but regular rhythm on exam. Rates in the 80's to low 100's. * - Will not add beta-blocker at this time given significant orthostatic hypotension. - Continue low dose Eliquis 2.5mg  twice daily.  Otherwise, per primary team: - Right hip fracture s/p total hip replacement - Mild delirium - Lung mass - COPD with continued tobacco use - Malnutrition   For questions or updates, please contact [place] Please consult [email or web address] for contact info under        Signed, Darreld Mclean, PA-C  02/21/2020, 9:53 AM

## 2020-02-21 NOTE — Progress Notes (Signed)
Patient ID: Bianca [name], female   DOB: 04-01-1941, 79 y.o.   MRN: [id number] I did see the patient yesterday 1 week out from a right total hip arthroplasty to treat a displaced femoral neck fracture.  She has been stable from orthopedic standpoint.  She is still in the hospital dealing with other medical issues including some orthostatic hypotension.  From an orthopedic standpoint, I did change her right hip dressing and her incision looked clean and intact with no drainage or redness.  A new dressing was applied.  She can still work on mobility with therapy.

## 2020-02-21 NOTE — Progress Notes (Signed)
Carotid artery duplex completed. Refer to "CV Proc" under chart review to view preliminary results.  02/21/2020 2:26 PM Kelby Aline., MHA, RVT, RDCS, RDMS

## 2020-02-21 NOTE — Progress Notes (Addendum)
   Patient continues to have significant orthostatic hypotension. Discussed with Dr. Radford Pax and Pharmacy. Will add Mestinon 60mg  daily today and see how she does with that. Per Pharmacy, Mestinon can be used with Midodrine but can sometimes cause bradycardia. Patient is more on the tachycardic side so this should be fine; however, we will place on telemetry to make sure she has no concerning arrhythmias. Will plan to recheck orthostatic vital signs tomorrow. Called and updated RN.  Darreld Mclean, PA-C 02/21/2020 12:31 PM   Addendum:  Damaris Schooner with Pharmacy again who recommended we actually dose Mestinon as 30mg  twice daily rather than 60mg  once daily. Will change order and update RN.  Darreld Mclean, PA-C 02/21/2020 12:40 PM

## 2020-02-22 LAB — BASIC METABOLIC PANEL
Anion gap: 10 (ref 5–15)
BUN: 15 mg/dL (ref 8–23)
CO2: 22 mmol/L (ref 22–32)
Calcium: 8.4 mg/dL — ABNORMAL LOW (ref 8.9–10.3)
Chloride: 109 mmol/L (ref 98–111)
Creatinine, Ser: 1.08 mg/dL — ABNORMAL HIGH (ref 0.44–1.00)
GFR calc Af Amer: 56 mL/min — ABNORMAL LOW (ref >60)
GFR calc non Af Amer: 48 mL/min — ABNORMAL LOW (ref >60)
Glucose, Bld: 98 mg/dL (ref 70–99)
Potassium: 3.9 mmol/L (ref 3.5–5.1)
Sodium: 141 mmol/L (ref 135–145)

## 2020-02-22 LAB — CBC
HCT: 29.5 % — ABNORMAL LOW (ref 36.0–46.0)
Hemoglobin: 9.3 g/dL — ABNORMAL LOW (ref 12.0–15.0)
MCH: 26.1 pg (ref 26.0–34.0)
MCHC: 31.5 g/dL (ref 30.0–36.0)
MCV: 82.9 fL (ref 80.0–100.0)
Platelets: 636 10*3/uL — ABNORMAL HIGH (ref 150–400)
RBC: 3.56 MIL/uL — ABNORMAL LOW (ref 3.87–5.11)
RDW: 15.3 % (ref 11.5–15.5)
WBC: 10.1 10*3/uL (ref 4.0–10.5)
nRBC: 0 % (ref 0.0–0.2)

## 2020-02-22 LAB — SARS CORONAVIRUS 2 (TAT 6-24 HRS): SARS Coronavirus 2: NEGATIVE

## 2020-02-22 MED ORDER — MIDODRINE HCL 5 MG PO TABS
2.5000 mg | ORAL_TABLET | Freq: Three times a day (TID) | ORAL | Status: DC
  Administered 2020-02-22 – 2020-02-23 (×5): 2.5 mg via ORAL
  Filled 2020-02-22 (×5): qty 1

## 2020-02-22 NOTE — Progress Notes (Signed)
PROGRESS NOTE    Bianca [name]  NGE:[id number] DOB: 06/16/1941 DOA: 02/11/2020 PCP: Trinna Post, PA-C  Brief Narrative:  79 year old white female Continued tobacco abuse COPD on inhalers Permanent A. fib CHADS2 score >4 on Eliquis as well as flecainide Orthostatic hypotension on midodrine CKD stage II-III Worsening protein energy malnutrition Recent diagnosis lung cancer being referred to [facility]  Admitted 02/11/2020 after fall tripping off the deck no LOC Recent weight loss noted by family-chest x-ray showed lung mass Orthopedics consulted and had total hip anterior arthroplasty 7/27  Was planning to discharge 8/2 however became severely hypotensive with mobility with therapy services and discharge delayed  Assessment & Plan:   Principal Problem:   Femoral neck fracture ([place]) Active Problems:   HYPERTENSION, BENIGN ESSENTIAL   COPD (chronic obstructive pulmonary disease) ([place])   Breast cancer of upper-outer quadrant of left female breast ([facility])   Tobacco abuse   AF (paroxysmal atrial fibrillation) ([facility])   Protein-calorie malnutrition, severe   Orthostatic hypotension   Syncope and collapse   Severe orthostasis acute on chronic symptomatic -Reports 1 year of orthostatic hypotension but not this profound previously  -Likely exacerbated by surgery, prolonged bedbound status, medication changes, anxiolytics and narcotics/pain medications -Orthostatic vital signs continue to be quite labile, requesting nurse to keep patient sitting up at bedside chair all day for orthostatics and forego any supine blood pressure medications.  She continues to have very slow responding blood pressure and feel that going from supine to standing will rapidly drop her blood pressure, sitting to standing may be more accurate today if we do not allow patient to stay supine for the majority of the day. - Cardiology continues to follow appreciate insight and recommendations - Currently titrating patient  medications today attempting pyridostigmine as well as ongoing binder of the abdomen and lower extremity stockings  Hip fracture status post repair 7/27, POA Dr. Ninfa Linden anterior hip arthroplasty right-sided Defer management to Ortho re: post-op management, WBAT currently and resumed Eliquis 2.5 twice daily Attempt to wean off narcotic medications, this will likely improve patient's symptoms as above PT recommending SNF placement, currently stable for discharge other than orthostatic hypotension as above  Permanent A. Fib CHADS2 score >4 Currently holding Cozaar, previously on flecainide, defer to cardiology as above -currently holding metoprolol as well Continue Eliquis 2.5 twice daily given advanced age and low BMI  CKD 2-3 ARB discontinued this admission All meds as above  Mild delirium, resolving Improving daily, continue to wean off narcotics as above Continue BuSpar 15 twice daily Remeron 15 at bedtime and watch for sedation confusion/sundowning/delirium  Moderate protein caloric malnutrition BMI 17-supplements as per dietary  Lung mass, unspecified In the setting of ongoing tobacco abuse  Supposed to have work-up for the same with PCP/[facility] per family  Continued tobacco use with COPD Unlikely will quit smoking despite lengthy conversation at bedside daily Will need further follow-up in the outpatient setting as above   DVT prophylaxis:Eliquis Code Status: Full code Family Communication:  None present, attempted to call without answer  Disposition:   Status is: Inpatient  Remains inpatient appropriate because:Unsafe d/c plan and IV treatments appropriate due to intensity of illness or inability to take PO currently unable to discharge given ongoing profound orthostatic hypotension with remarkable symptoms.   Dispo:  Patient From: Home  Planned Disposition: [place]  Expected discharge date: 7/31 2021 orthopedics  Medically stable for discharge:  No    Consultants:   Orthopedics  Procedures: Anterior hip  repair  Antimicrobials: None   Subjective:  No acute issues or events overnight, only complaint today is of constipation, denies nausea, vomiting, diarrhea, headache, fevers, chills.  Objective: Vitals:   02/21/20 1927 02/21/20 1936 02/22/20 0306 02/22/20 0747  BP: (!) 182/88  (!) 198/93 (!) 161/82  Pulse: (!) 108  100 89  Resp: 16  17 18   Temp: 98.4 F (36.9 C)  98.6 F (37 C) 98 F (36.7 C)  TempSrc: Oral  Oral   SpO2: 97% 98% 98% 99%  Weight:      Height:       No intake or output data in the 24 hours ending 02/22/20 0839 Filed Weights   02/11/20 1041 02/13/20 1304  Weight: 47.6 kg 47.6 kg    Examination:  General: Cachectic appearing elderly female, pleasantly resting in bed, No acute distress. HEENT:  Normocephalic atraumatic.  Sclerae nonicteric, noninjected.  Extraocular movements intact bilaterally. Neck:  Without mass or deformity.  Trachea is midline. Lungs:  Clear to auscultate bilaterally without rhonchi, wheeze, or rales. Heart:  Regular rate and rhythm.  Without murmurs, rubs, or gallops. Abdomen:  Soft, nontender, nondistended.  Without guarding or rebound. Extremities: Right lower extremity range of motion limited by pain and weakness, otherwise 5 out of 5 strength. Vascular:  Dorsalis pedis and posterior tibial pulses palpable bilaterally. Skin: Bandage clean dry intact over right hip   Data Reviewed: I have personally reviewed following labs and imaging studies Covid test is negative Hemoglobin 8.5 BUN/creatinine 40/1.0 No white count   Radiology Studies: VAS US CAROTID  Result Date: 02/21/2020 Carotid Arterial Duplex Study Indications:       Syncope and orthostatic hypotension. Risk Factors:      Hypertension. Comparison Study:  No prior study Performing Technologist: Maudry Mayhew MHA, RDMS, RVT, RDCS  Examination Guidelines: A complete evaluation includes B-mode imaging,  spectral Doppler, color Doppler, and power Doppler as needed of all accessible portions of each vessel. Bilateral testing is considered an integral part of a complete examination. Limited examinations for reoccurring indications may be performed as noted.  Right Carotid Findings: +----------+-------+-------+--------+---------------------------------+--------+           PSV    EDV    StenosisPlaque Description               Comments           cm/s   cm/s                                                     +----------+-------+-------+--------+---------------------------------+--------+ CCA Prox  66     10                                                       +----------+-------+-------+--------+---------------------------------+--------+ CCA Distal51     11             calcific, heterogenous and                                                irregular                                 +----------+-------+-------+--------+---------------------------------+--------+  ICA Prox  96     21             calcific                                  +----------+-------+-------+--------+---------------------------------+--------+ ICA Distal82     22                                                       +----------+-------+-------+--------+---------------------------------+--------+ ECA       65     11                                                       +----------+-------+-------+--------+---------------------------------+--------+ +----------+--------+-------+----------------+-------------------+           PSV cm/sEDV cmsDescribe        Arm Pressure (mmHG) +----------+--------+-------+----------------+-------------------+ [id number]             Multiphasic, WNL                    +----------+--------+-------+----------------+-------------------+ +---------+--------+--+--------+--+---------+ VertebralPSV cm/s57EDV cm/s15Antegrade  +---------+--------+--+--------+--+---------+  Left Carotid Findings: +----------+-------+-------+--------+---------------------------------+--------+           PSV    EDV    StenosisPlaque Description               Comments           cm/s   cm/s                                                     +----------+-------+-------+--------+---------------------------------+--------+ CCA Prox  66     12             heterogenous, irregular and                                               calcific                                  +----------+-------+-------+--------+---------------------------------+--------+ CCA Distal49     9                                                        +----------+-------+-------+--------+---------------------------------+--------+ ICA Prox  143    33             heterogenous and calcific                 +----------+-------+-------+--------+---------------------------------+--------+ ICA Distal79     19                                                       +----------+-------+-------+--------+---------------------------------+--------+  ECA       64     8              heterogenous and calcific                 +----------+-------+-------+--------+---------------------------------+--------+ +----------+--------+--------+----------------+-------------------+           PSV cm/sEDV cm/sDescribe        Arm Pressure (mmHG) +----------+--------+--------+----------------+-------------------+ [id number]             Multiphasic, WNL                    +----------+--------+--------+----------------+-------------------+ +---------+--------+--+--------+--+---------+ VertebralPSV cm/s92EDV cm/s17Antegrade +---------+--------+--+--------+--+---------+   Summary: Right Carotid: Velocities in the right ICA are consistent with a 1-39% stenosis. Left Carotid: Velocities in the left ICA are consistent with a 1-39% stenosis,                upper end of range. Vertebrals:  Bilateral vertebral arteries demonstrate antegrade flow. Subclavians: Normal flow hemodynamics were seen in bilateral subclavian              arteries. *See table(s) above for measurements and observations.  Electronically signed by Harold Barban MD on 02/21/2020 at 4:56:11 PM.    Final      Scheduled Meds: . apixaban  2.5 mg Oral BID  . busPIRone  15 mg Oral BID  . cholecalciferol  2,000 Units Oral Daily  . feeding supplement (ENSURE ENLIVE)  237 mL Oral TID BM  . ferrous gluconate  324 mg Oral BID WC  . midodrine  2.5 mg Oral TID WC  . mirtazapine  15 mg Oral QHS  . mometasone-formoterol  2 puff Inhalation BID  . multivitamin with minerals  1 tablet Oral Daily  . nicotine  7 mg Transdermal Daily  . pyridostigmine  30 mg Oral BID  . senna  1 tablet Oral BID   Continuous Infusions:    LOS: 11 days    Time spent: [place], DO [facility] To contact the attending provider between 7A-7P or the covering provider during after hours 7P-7A, please log into the web site [email or web address] and access using universal [place] password for that web site. If you do not have the password, please call the hospital operator.  02/22/2020, 8:39 AM

## 2020-02-22 NOTE — Plan of Care (Signed)
  Problem: Education: Goal: Knowledge of General Education information will improve Description: Including pain rating scale, medication(s)/side effects and non-pharmacologic comfort measures Outcome: Progressing   Problem: Health Behavior/Discharge Planning: Goal: Ability to manage health-related needs will improve Outcome: Progressing   Problem: Activity: Goal: Risk for activity intolerance will decrease Outcome: Progressing   

## 2020-02-22 NOTE — Progress Notes (Signed)
Progress Note  Patient Name: Bianca [name] Date of Encounter: 02/22/2020  [facility] Cardiologist: Cristopher Peru, MD   Subjective   Denies any dizziness or syncope. Still was orthostatic yesterday despite abdominal binder and compression hose.  Midodrine lowered due to supine HTN.  Added Mestinon  Inpatient Medications    Scheduled Meds: . apixaban  2.5 mg Oral BID  . busPIRone  15 mg Oral BID  . cholecalciferol  2,000 Units Oral Daily  . feeding supplement (ENSURE ENLIVE)  237 mL Oral TID BM  . ferrous gluconate  324 mg Oral BID WC  . midodrine  5 mg Oral TID WC  . mirtazapine  15 mg Oral QHS  . mometasone-formoterol  2 puff Inhalation BID  . multivitamin with minerals  1 tablet Oral Daily  . nicotine  7 mg Transdermal Daily  . pyridostigmine  30 mg Oral BID  . senna  1 tablet Oral BID   Continuous Infusions: . sodium chloride 50 mL/hr at 02/20/20 0847   PRN Meds: acetaminophen, acetaminophen, albuterol, menthol-cetylpyridinium **OR** phenol, methocarbamol **OR** [DISCONTINUED] methocarbamol (ROBAXIN) IV, ondansetron **OR** ondansetron (ZOFRAN) IV   Vital Signs    Vitals:   02/21/20 1927 02/21/20 1936 02/22/20 0306 02/22/20 0747  BP: (!) 182/88  (!) 198/93 (!) 161/82  Pulse: (!) 108  100 89  Resp: 16  17 18   Temp: 98.4 F (36.9 C)  98.6 F (37 C) 98 F (36.7 C)  TempSrc: Oral  Oral   SpO2: 97% 98% 98% 99%  Weight:      Height:       No intake or output data in the 24 hours ending 02/22/20 0819 Last 3 Weights 02/13/2020 02/11/2020 01/30/2020  Weight (lbs) 105 lb 105 lb 105 lb  Weight (kg) 47.628 kg 47.628 kg 47.628 kg      Telemetry    NSR - personally reviewed  ECG    No new ECG tracing today. - Personally Reviewed  Physical Exam   GEN: Well nourished, well developed in no acute distress HEENT: Normal NECK: No JVD; No carotid bruits LYMPHATICS: No lymphadenopathy CARDIAC:RRR, no murmurs, rubs, gallops RESPIRATORY:  Clear to auscultation  without rales, wheezing or rhonchi  ABDOMEN: Soft, non-tender, non-distended MUSCULOSKELETAL:  No edema; No deformity  SKIN: Warm and dry NEUROLOGIC:  Alert and oriented x 3 PSYCHIATRIC:  Normal affect    Labs    High Sensitivity Troponin:  No results for input(s): TROPONINIHS in the last 720 hours.    Chemistry Recent Labs  Lab 02/20/20 0326 02/21/20 0249 02/22/20 0515  NA 139 141 141  K 3.9 3.8 3.9  CL 103 107 109  CO2 24 25 22   GLUCOSE 100* 91 98  BUN 14 14 15   CREATININE 1.05* 1.00 1.08*  CALCIUM 8.6* 8.3* 8.4*  ALBUMIN 2.5*  --   --   GFRNONAA 50* 53* 48*  GFRAA 58* >60 56*  ANIONGAP 12 9 10      Hematology Recent Labs  Lab 02/20/20 0823 02/21/20 0249 02/22/20 0515  WBC 8.5 10.0 10.1  RBC 3.35* 3.30* 3.56*  HGB 8.5* 8.6* 9.3*  HCT 27.4* 27.3* 29.5*  MCV 81.8 82.7 82.9  MCH 25.4* 26.1 26.1  MCHC 31.0 31.5 31.5  RDW 14.9 15.1 15.3  PLT 547* 598* 636*    BNPNo results for input(s): BNP, PROBNP in the last 168 hours.   DDimer No results for input(s): DDIMER in the last 168 hours.   Radiology    VAS US  CAROTID  Result Date: 02/21/2020 Carotid Arterial Duplex Study Indications:       Syncope and orthostatic hypotension. Risk Factors:      Hypertension. Comparison Study:  No prior study Performing Technologist: Maudry Mayhew MHA, RDMS, RVT, RDCS  Examination Guidelines: A complete evaluation includes B-mode imaging, spectral Doppler, color Doppler, and power Doppler as needed of all accessible portions of each vessel. Bilateral testing is considered an integral part of a complete examination. Limited examinations for reoccurring indications may be performed as noted.  Right Carotid Findings: +----------+-------+-------+--------+---------------------------------+--------+           PSV    EDV    StenosisPlaque Description               Comments           cm/s   cm/s                                                      +----------+-------+-------+--------+---------------------------------+--------+ CCA Prox  66     10                                                       +----------+-------+-------+--------+---------------------------------+--------+ CCA Distal51     11             calcific, heterogenous and                                                irregular                                 +----------+-------+-------+--------+---------------------------------+--------+ ICA Prox  96     21             calcific                                  +----------+-------+-------+--------+---------------------------------+--------+ ICA Distal82     22                                                       +----------+-------+-------+--------+---------------------------------+--------+ ECA       65     11                                                       +----------+-------+-------+--------+---------------------------------+--------+ +----------+--------+-------+----------------+-------------------+           PSV cm/sEDV cmsDescribe        Arm Pressure (mmHG) +----------+--------+-------+----------------+-------------------+ [id number]             Multiphasic, WNL                    +----------+--------+-------+----------------+-------------------+ +---------+--------+--+--------+--+---------+  VertebralPSV cm/s57EDV cm/s15Antegrade +---------+--------+--+--------+--+---------+  Left Carotid Findings: +----------+-------+-------+--------+---------------------------------+--------+           PSV    EDV    StenosisPlaque Description               Comments           cm/s   cm/s                                                     +----------+-------+-------+--------+---------------------------------+--------+ CCA Prox  66     12             heterogenous, irregular and                                               calcific                                   +----------+-------+-------+--------+---------------------------------+--------+ CCA Distal49     9                                                        +----------+-------+-------+--------+---------------------------------+--------+ ICA Prox  143    33             heterogenous and calcific                 +----------+-------+-------+--------+---------------------------------+--------+ ICA Distal79     19                                                       +----------+-------+-------+--------+---------------------------------+--------+ ECA       64     8              heterogenous and calcific                 +----------+-------+-------+--------+---------------------------------+--------+ +----------+--------+--------+----------------+-------------------+           PSV cm/sEDV cm/sDescribe        Arm Pressure (mmHG) +----------+--------+--------+----------------+-------------------+ [id number]             Multiphasic, WNL                    +----------+--------+--------+----------------+-------------------+ +---------+--------+--+--------+--+---------+ VertebralPSV cm/s92EDV cm/s17Antegrade +---------+--------+--+--------+--+---------+   Summary: Right Carotid: Velocities in the right ICA are consistent with a 1-39% stenosis. Left Carotid: Velocities in the left ICA are consistent with a 1-39% stenosis,               upper end of range. Vertebrals:  Bilateral vertebral arteries demonstrate antegrade flow. Subclavians: Normal flow hemodynamics were seen in bilateral subclavian              arteries. *See table(s) above for measurements and observations.  Electronically signed by Harold Barban MD on 02/21/2020 at 4:56:11 PM.    Final     Cardiac Studies  Echocardiogram 01/22/2019: Impressions: 1. The left ventricle has normal systolic function with an ejection  fraction of 60-65%. The cavity size was normal. There is moderate  concentric left ventricular  hypertrophy. Left ventricular diastolic  Doppler parameters are consistent with  pseudonormalization. Elevated left atrial and left ventricular  end-diastolic pressures.  2. The right ventricle has normal systolic function. The cavity was  normal. There is no increase in right ventricular wall thickness. Right  ventricular systolic pressure is moderately elevated.  3. Mitral valve regurgitation is moderate by color flow Doppler.  4. The aortic valve is tricuspid. Moderate thickening of the aortic  valve. Moderate calcification of the aortic valve. Aortic valve  regurgitation was not assessed by color flow Doppler.  5. The inferior vena cava was dilated in size with <50% respiratory  variability.   Patient Profile     Bianca [name] is a 79 y.o. female with a history of paroxysmal atrial fibrillation on Eliquis, hypertension with orthostatic hypotension on Midodrine as needed at home, COPD, asthma, anxiety, tobacco abuse, and recently diagnosed lung mass concerning for cancer who is being seen today for the evaluation of orthostatis at the request of Dr/ [place].  Assessment & Plan    Orthostatic Hypotension  - Positive orthostatic the last 2 days with systolic BP dropping as much as 60 mmHg going from lying to sitting.  - Florinef stopped due to HTN and abdominal binder and thigh high compression stockings placed.  - Decrease Midodrine to 2.5mg  TID (7am, 12pm, and 5pm to avoid supine hypertension at night) since BP elevated. - AM Cortisol level normal. - carotid dopplers 1-39% stenosis with normal vertebral flow - Will recheck orthostatic vitals signs today with compression stocking,abdominal binder in place and after getting Mestinon and if still orthostatic will increase Mestinon to 60mg  BID.  Paroxysmal Atrial Fibrillation - maintaining NSR - Will not add beta-blocker at this time given significant orthostatic hypotension. - Continue low dose Eliquis 2.5mg  twice  daily.  Otherwise, per primary team: - Right hip fracture s/p total hip replacement - Mild delirium - Lung mass - COPD with continued tobacco use - Malnutrition   For questions or updates, please contact [place] Please consult [email or web address] for contact info under        Signed, Fransico Him, MD  02/22/2020, 8:19 AM

## 2020-02-22 NOTE — Progress Notes (Signed)
Orthostatic BP much improved after starting Mestinon.  Will keep one more day since I decreased her proamatine today due to HTN.  Continue current dose of mestinon.

## 2020-02-22 NOTE — Progress Notes (Signed)
Occupational Therapy Treatment Patient Details Name: Bianca [name] MRN: [id number] DOB: Nov 29, 1940 Today's Date: 02/22/2020    History of present illness Pt is an 79 yr old female admitted on 02/11/20 with hip fx after fall tripping on deck hitting head on chair and requiring R THA direct anterior approach on 02/13/20. CT head negative. Age-indeterminate mild T3 superior endplate compression fracture. C-Spine: negative. CT abdmomen: LLL lung mass, trace pleural effusion. Of note, pt with cough x4 weeks and weight loss of 10# in a few months. PMHx:  atrial fibrillation, anxiety, COPD, arthritis, h/o breast CA s/p mastectomy.   OT comments  Treatment focused on patient tolerating mobility and limited functional tasks. Patient has been limited by orthostatic BP. Patient min assist to transfer to side of bed, max assist to donn underwear at edge of bed and min guard to stand and transfer to recliner with RW. Patient does not report symptoms with position changes today. BP as follows:   Lying:172/75 Sitting: 138/77 After transfer to chair: 132/74  Plan to advance activity and self care tasks as long as vitals stay WFL. Cont POC.    Follow Up Recommendations  SNF;Supervision/Assistance - 24 hour    Equipment Recommendations  Other (comment)    Recommendations for Other Services      Precautions / Restrictions Precautions Precautions: Fall Precaution Comments: x6 times in past 6 months, watch BP (orthostatic hypotension) Restrictions Weight Bearing Restrictions: No RLE Weight Bearing: Weight bearing as tolerated Other Position/Activity Restrictions: poor vision       Mobility Bed Mobility Overal bed mobility: Needs Assistance Bed Mobility: Supine to Sit     Supine to sit: Min assist     General bed mobility comments: Min assist for small pivot to square up at edge of bed and scoot to edge of bed.  Transfers Overall transfer level: Needs assistance Equipment used: Rolling  walker (2 wheeled) Transfers: Sit to/from Stand Sit to Stand: Min guard;From elevated surface         General transfer comment: Min guard to stand from elevated bed and take steps to recliner.    Balance Overall balance assessment: Needs assistance Sitting-balance support: Feet supported;No upper extremity supported Sitting balance-Leahy Scale: Fair     Standing balance support: Bilateral upper extremity supported;During functional activity Standing balance-Leahy Scale: Poor                             ADL either performed or assessed with clinical judgement   ADL   Eating/Feeding: Independent;Sitting (Patient assisted with bedside table management to prepare to feed sitting in recliner.)                   Lower Body Dressing: Set up;Maximal assistance Lower Body Dressing Details (indicate cue type and reason): Mod assist to donn underwear for assistance with RLE and pull up over hips. Patient assisted with LLE and assisted on Left side but needed to hold onto walker.                     Vision       Perception     Praxis      Cognition Arousal/Alertness: Awake/alert Behavior During Therapy: WFL for tasks assessed/performed Overall Cognitive Status: Within Functional Limits for tasks assessed  Exercises     Shoulder Instructions       General Comments      Pertinent Vitals/ Pain       Pain Assessment: No/denies pain  Home Living                                          Prior Functioning/Environment              Frequency  Min 2X/week        Progress Toward Goals  OT Goals(current goals can now be found in the care plan section)  Progress towards OT goals: Progressing toward goals  Acute Rehab OT Goals Patient Stated Goal: to go to SNF OT Goal Formulation: With patient Time For Goal Achievement: 02/27/20 Potential to Achieve Goals: Good   Plan Discharge plan remains appropriate    Co-evaluation                 AM-PAC OT "6 Clicks" Daily Activity     Outcome Measure   Help from another person eating meals?: A Little Help from another person taking care of personal grooming?: A Little Help from another person toileting, which includes using toliet, bedpan, or urinal?: A Lot Help from another person bathing (including washing, rinsing, drying)?: A Lot Help from another person to put on and taking off regular upper body clothing?: A Little Help from another person to put on and taking off regular lower body clothing?: A Lot 6 Click Score: 15    End of Session Equipment Utilized During Treatment: Gait belt;Rolling walker  OT Visit Diagnosis: Unsteadiness on feet (R26.81);Muscle weakness (generalized) (M62.81);Pain   Activity Tolerance Patient tolerated treatment well   Patient Left in chair;with call bell/phone within reach;with chair alarm set   Nurse Communication  (okay to see per RN. Reports MD wants patient up to chair)        Time: 3428-7681 OT Time Calculation (min): 20 min  Charges: OT General Charges $OT Visit: 1 Visit OT Treatments $Therapeutic Activity: 8-22 mins  Derl Barrow, OTR/L [place]  Office [phone] Pager: [place] 02/22/2020, 12:22 PM

## 2020-02-22 NOTE — Progress Notes (Signed)
Nutrition Follow-up  DOCUMENTATION CODES:   Underweight, Severe malnutrition in context of chronic illness  INTERVENTION:   -Continue MVI with minerals daily -ContinueEnsure Enlive poTID, each supplement provides 350 kcal and 20 grams of protein -Continue Magic cup TID with meals, each supplement provides 290 kcal and 9 grams of protein  NUTRITION DIAGNOSIS:   Severe Malnutrition related to chronic illness (COPD) as evidenced by moderate fat depletion, severe fat depletion, moderate muscle depletion, severe muscle depletion.  Ongoing  GOAL:   Patient will meet greater than or equal to 90% of their needs  Progressing   MONITOR:   PO intake, Supplement acceptance, Labs, Weight trends, Skin, I & O's  REASON FOR ASSESSMENT:   Consult Assessment of nutrition requirement/status  ASSESSMENT:   Bianca [name] is a 79 y.o. female with medical history significant of hypertension, paroxysmal atrial fibrillation on Eliquis orthostatic hypotension, COPD, anxiety, history of breast cancer status postmastectomy, osteoporosis, and tobacco abuse who presents after having a fall.  7/27- s/pRight total hip arthroplasty through direct anterior approach.  Reviewed I/O's: -1.9 L since admission  Attempted to speak with pt via hospital room phone, however, no answer.   No meal completion records available to assess at this time. Pt is consuming Ensure Enlive supplements.   Per TOC team notes, plan to d/c to SNF once medically stable.  Labs reviewed: CBGS: 101.  Diet Order:   Diet Order            Diet - low sodium heart healthy           Diet regular Room service appropriate? Yes; Fluid consistency: Thin  Diet effective now                 EDUCATION NEEDS:   Education needs have been addressed  Skin:  Skin Assessment: Skin Integrity Issues: Skin Integrity Issues:: Incisions Incisions: closed rt hip  Last BM:  02/18/20  Height:   Ht Readings from Last 1 Encounters:   02/13/20 5\' 5"  (1.651 m)    Weight:   Wt Readings from Last 1 Encounters:  02/13/20 47.6 kg    Ideal Body Weight:  56.8 kg  BMI:  Body mass index is 17.47 kg/m.  Estimated Nutritional Needs:   Kcal:  1500-1700  Protein:  70-85 grams  Fluid:  > 1.5 L    Loistine Chance, RD, LDN, [place] Registered Dietitian II Certified Diabetes Care and Education Specialist Please refer to [facility] for RD and/or RD on-call/weekend/after hours pager

## 2020-02-23 ENCOUNTER — Other Ambulatory Visit: Payer: Self-pay | Admitting: Physician Assistant

## 2020-02-23 DIAGNOSIS — Z7901 Long term (current) use of anticoagulants: Secondary | ICD-10-CM | POA: Diagnosis not present

## 2020-02-23 DIAGNOSIS — C50412 Malignant neoplasm of upper-outer quadrant of left female breast: Secondary | ICD-10-CM | POA: Diagnosis not present

## 2020-02-23 DIAGNOSIS — F411 Generalized anxiety disorder: Secondary | ICD-10-CM | POA: Diagnosis not present

## 2020-02-23 DIAGNOSIS — R2681 Unsteadiness on feet: Secondary | ICD-10-CM | POA: Diagnosis not present

## 2020-02-23 DIAGNOSIS — R5381 Other malaise: Secondary | ICD-10-CM | POA: Diagnosis not present

## 2020-02-23 DIAGNOSIS — R55 Syncope and collapse: Secondary | ICD-10-CM | POA: Diagnosis not present

## 2020-02-23 DIAGNOSIS — I480 Paroxysmal atrial fibrillation: Secondary | ICD-10-CM | POA: Diagnosis not present

## 2020-02-23 DIAGNOSIS — Z720 Tobacco use: Secondary | ICD-10-CM | POA: Diagnosis not present

## 2020-02-23 DIAGNOSIS — F339 Major depressive disorder, recurrent, unspecified: Secondary | ICD-10-CM | POA: Diagnosis not present

## 2020-02-23 DIAGNOSIS — J449 Chronic obstructive pulmonary disease, unspecified: Secondary | ICD-10-CM | POA: Diagnosis not present

## 2020-02-23 DIAGNOSIS — F29 Unspecified psychosis not due to a substance or known physiological condition: Secondary | ICD-10-CM | POA: Diagnosis not present

## 2020-02-23 DIAGNOSIS — I10 Essential (primary) hypertension: Secondary | ICD-10-CM | POA: Diagnosis not present

## 2020-02-23 DIAGNOSIS — S72002D Fracture of unspecified part of neck of left femur, subsequent encounter for closed fracture with routine healing: Secondary | ICD-10-CM | POA: Diagnosis not present

## 2020-02-23 DIAGNOSIS — R278 Other lack of coordination: Secondary | ICD-10-CM | POA: Diagnosis not present

## 2020-02-23 DIAGNOSIS — R262 Difficulty in walking, not elsewhere classified: Secondary | ICD-10-CM | POA: Diagnosis not present

## 2020-02-23 DIAGNOSIS — Z7401 Bed confinement status: Secondary | ICD-10-CM | POA: Diagnosis not present

## 2020-02-23 DIAGNOSIS — N183 Chronic kidney disease, stage 3 unspecified: Secondary | ICD-10-CM | POA: Diagnosis not present

## 2020-02-23 DIAGNOSIS — M6281 Muscle weakness (generalized): Secondary | ICD-10-CM | POA: Diagnosis not present

## 2020-02-23 DIAGNOSIS — I129 Hypertensive chronic kidney disease with stage 1 through stage 4 chronic kidney disease, or unspecified chronic kidney disease: Secondary | ICD-10-CM | POA: Diagnosis not present

## 2020-02-23 DIAGNOSIS — I4891 Unspecified atrial fibrillation: Secondary | ICD-10-CM | POA: Diagnosis not present

## 2020-02-23 DIAGNOSIS — C3492 Malignant neoplasm of unspecified part of left bronchus or lung: Secondary | ICD-10-CM | POA: Diagnosis not present

## 2020-02-23 DIAGNOSIS — S72001A Fracture of unspecified part of neck of right femur, initial encounter for closed fracture: Secondary | ICD-10-CM | POA: Diagnosis not present

## 2020-02-23 DIAGNOSIS — I951 Orthostatic hypotension: Secondary | ICD-10-CM | POA: Diagnosis not present

## 2020-02-23 DIAGNOSIS — E441 Mild protein-calorie malnutrition: Secondary | ICD-10-CM | POA: Diagnosis not present

## 2020-02-23 DIAGNOSIS — S7291XD Unspecified fracture of right femur, subsequent encounter for closed fracture with routine healing: Secondary | ICD-10-CM | POA: Diagnosis not present

## 2020-02-23 DIAGNOSIS — W19XXXD Unspecified fall, subsequent encounter: Secondary | ICD-10-CM | POA: Diagnosis not present

## 2020-02-23 DIAGNOSIS — J438 Other emphysema: Secondary | ICD-10-CM | POA: Diagnosis not present

## 2020-02-23 DIAGNOSIS — J984 Other disorders of lung: Secondary | ICD-10-CM

## 2020-02-23 DIAGNOSIS — M2550 Pain in unspecified joint: Secondary | ICD-10-CM | POA: Diagnosis not present

## 2020-02-23 DIAGNOSIS — F39 Unspecified mood [affective] disorder: Secondary | ICD-10-CM | POA: Diagnosis not present

## 2020-02-23 DIAGNOSIS — J439 Emphysema, unspecified: Secondary | ICD-10-CM | POA: Diagnosis not present

## 2020-02-23 DIAGNOSIS — E43 Unspecified severe protein-calorie malnutrition: Secondary | ICD-10-CM | POA: Diagnosis not present

## 2020-02-23 DIAGNOSIS — S7291XA Unspecified fracture of right femur, initial encounter for closed fracture: Secondary | ICD-10-CM | POA: Diagnosis not present

## 2020-02-23 MED ORDER — MIDODRINE HCL 2.5 MG PO TABS: 2.5000 mg | ORAL_TABLET | Freq: Three times a day (TID) | ORAL | 0 refills | Status: DC

## 2020-02-23 MED ORDER — PYRIDOSTIGMINE BROMIDE 60 MG PO TABS: 30.0000 mg | ORAL_TABLET | Freq: Two times a day (BID) | ORAL | 0 refills | Status: DC

## 2020-02-23 NOTE — Progress Notes (Addendum)
Report given to Janett Billow ,Therapist, sports at [facility] facility.all questions and concerns were fully answered.

## 2020-02-23 NOTE — Discharge Summary (Signed)
Physician Discharge Summary  Bianca [name] IOE:[id number] DOB: January 07, 1941 DOA: 02/11/2020  PCP: Trinna Post, PA-C  Admit date: 02/11/2020 Discharge date: 02/23/2020  Admitted From: Home Disposition: SNF  Recommendations for Outpatient Follow-up:  1. Follow up with PCP in 1-2 weeks 2. Follow-up with cardiology as scheduled  Discharge Condition: Stable CODE STATUS: Full Diet recommendation: As tolerated  Brief/Interim Summary: 79 year old white female - Continued tobacco abuse COPD on inhalers - Permanent A. fib CHADS2 score >4 on Eliquis as well as flecainide -Chronic orthostatic hypotension on midodrine - CKD stage II-III - Worsening protein energy malnutrition - Recent diagnosis lung cancer being referred to [facility]  Admitted 02/11/2020 after fall tripping off the deck no LOC Recent weight loss noted by family-chest x-ray showed lung mass Orthopedics consulted and had total hip anterior arthroplasty 7/27 Was planning to discharge 8/2 however became severely hypotensive with mobility with therapy services and discharge delayed.  Cardiology following along, appreciate insight and recommendations, patient's blood pressure and orthostatic vitals have markedly improved with new regimen including midodrine, compression stockings and abdominal binder.  She does continue to have somewhat labile blood pressure with notable hypertension while supine and decreased blood pressure while standing but patient remains asymptomatic now for over 24 hours, otherwise stable and agreeable for discharge.  Patient's orthostatic hypotension appears to be somewhat chronic per chart review likely exacerbated by recent surgery, pain medication and poor p.o. intake.  Given resolution she is otherwise stable and agreeable for discharge to skilled nursing facility for ongoing physical therapy and rehab.  Follow up with PCP, cardiology and neurology as scheduled.  Discharge Diagnoses:  Principal Problem:    Femoral neck fracture (HCC) Active Problems:   HYPERTENSION, BENIGN ESSENTIAL   COPD (chronic obstructive pulmonary disease) (HCC)   Breast cancer of upper-outer quadrant of left female breast (HCC)   Tobacco abuse   AF (paroxysmal atrial fibrillation) (HCC)   Protein-calorie malnutrition, severe   Orthostatic hypotension   Syncope and collapse    Discharge Instructions  Discharge Instructions    Call MD for:  difficulty breathing, headache or visual disturbances   Complete by: As directed    Call MD for:  extreme fatigue   Complete by: As directed    Call MD for:  persistant dizziness or light-headedness   Complete by: As directed    Call MD for:  persistant nausea and vomiting   Complete by: As directed    Call MD for:  redness, tenderness, or signs of infection (pain, swelling, redness, odor or green/yellow discharge around incision site)   Complete by: As directed    Call MD for:  temperature >100.4   Complete by: As directed    Diet - low sodium heart healthy   Complete by: As directed    Discharge wound care:   Complete by: As directed    'Reinforced dressing until discontinued' per Ortho   Increase activity slowly   Complete by: As directed    Increase activity slowly   Complete by: As directed    No wound care   Complete by: As directed      Allergies as of 02/23/2020      Reactions   Xanax [alprazolam] Other (See Comments)   combative      Medication List    STOP taking these medications   denosumab 60 MG/ML Sosy injection Commonly known as: PROLIA   FEXOFENADINE HCL PO   losartan 25 MG tablet Commonly known as: COZAAR  TAKE these medications   acetaminophen 500 MG tablet Commonly known as: TYLENOL Take 1,000 mg by mouth every 6 (six) hours as needed for headache (pain).   Advair Diskus 100-50 MCG/DOSE Aepb Generic drug: Fluticasone-Salmeterol INHALE 1 PUFF TWICE A DAY AS DIRECTED **RINSE MOUTH AFTER USE** What changed:   how much to  take  how to take this  when to take this  additional instructions   apixaban 2.5 MG Tabs tablet Commonly known as: Eliquis Take 1 tablet (2.5 mg total) by mouth 2 (two) times daily.   busPIRone 15 MG tablet Commonly known as: BUSPAR Take 1 tablet (15 mg total) by mouth 2 (two) times daily. What changed: when to take this   CALCIUM+D3 PO Take 1 tablet by mouth daily.   ferrous gluconate 324 MG tablet Commonly known as: FERGON Take 1 tablet (324 mg total) by mouth 2 (two) times daily with a meal.   HYDROcodone-acetaminophen 5-325 MG tablet Commonly known as: NORCO/VICODIN Take 1 tablet by mouth every 4 (four) hours as needed for moderate pain.   lactose free nutrition Liqd Take 237 mLs by mouth 2 (two) times daily between meals.   methocarbamol 500 MG tablet Commonly known as: ROBAXIN Take 1 tablet (500 mg total) by mouth every 6 (six) hours as needed for muscle spasms.   midodrine 2.5 MG tablet Commonly known as: PROAMATINE Take 1 tablet (2.5 mg total) by mouth 3 (three) times daily with meals. What changed:   medication strength  how much to take  how to take this  when to take this  additional instructions   mirtazapine 15 MG tablet Commonly known as: Remeron Take 1 tablet (15 mg total) by mouth at bedtime.   ProAir HFA 108 (90 Base) MCG/ACT inhaler Generic drug: albuterol INHALE 1-2 PUFFS INTO THE LUNGS EVERY 4 HOURS AS NEEDED What changed: See the new instructions.   pyridostigmine 60 MG tablet Commonly known as: MESTINON Take 0.5 tablets (30 mg total) by mouth 2 (two) times daily.   senna 8.6 MG Tabs tablet Commonly known as: SENOKOT Take 1 tablet (8.6 mg total) by mouth 2 (two) times daily.   UNABLE TO FIND Rx: L8000- Post Surgical Bras (Quantity: 6)\ Dx: 174.9; Left mastectomy   vitamin C 500 MG tablet Commonly known as: ASCORBIC ACID Take 500 mg by mouth daily.   Vitamin D3 50 MCG (2000 UT) Tabs Take 2,000 Units by mouth daily.             Discharge Care Instructions  (From admission, onward)         Start     Ordered   02/23/20 0000  Discharge wound care:       Comments: 'Reinforced dressing until discontinued' per Ortho   02/23/20 1223          Follow-up Information    Mcarthur Rossetti, MD. Schedule an appointment as soon as possible for a visit in 2 week(s).   Specialty: Orthopedic Surgery Contact information: [place] [address] [phone]        Baldwin Jamaica, PA-C Follow up on 03/07/2020.   Specialty: Cardiology Why: Please arrive 15 minutes early for your 1:45pm post-hospital cardiology follow-up appointment Contact information: [address] [place] [address] [phone]              Allergies  Allergen Reactions  . Xanax [Alprazolam] Other (See Comments)    combative    Consultations:  Cardiology, orthopedic surgery  Procedures/Studies: DG Chest 1 View  Result Date: 02/11/2020 CLINICAL DATA:  Fall.  Concern for possible hip fracture. EXAM: CHEST  1 VIEW COMPARISON:  01/30/2020 FINDINGS: Normal heart size. There is blunting of the left costophrenic angle concerning for small effusion. Left lower lobe lung mass is again identified measuring 4.7 cm. The lungs are otherwise clear. No acute airspace consolidation IMPRESSION: 1. Small left pleural effusion. 2. Left lower lobe lung mass. Advise further evaluation with contrast enhanced CT of the chest. Electronically Signed   By: Kerby Moors M.D.   On: 02/11/2020 11:53   DG Chest 2 View  Result Date: 01/31/2020 CLINICAL DATA:  Recent ammonia, productive cough, remote left breast cancer, COPD and hypertension EXAM: CHEST - 2 VIEW COMPARISON:  01/24/2019 FINDINGS: Left lower lobe posterior masslike opacity with central lucency measuring 5 x 4 cm, concerning for a lung mass with central cavitation. This warrants further evaluation with chest CT. Mild hyperinflation, suspect underlying  COPD/emphysema. Minor apical scarring. Right lung is clear. Normal heart size and vascularity.  Aorta atherosclerotic. No superimposed edema, effusion or pneumothorax. Trachea midline. Aorta atherosclerotic. Bones are osteopenic and degenerative changes noted of the spine. Minor scoliosis. T12 compression fracture as before, age indeterminate. Height loss is estimated at 50% anteriorly. IMPRESSION: Posterior left lower lobe masslike opacity as above. Recommend further evaluation with chest CT. Aortic Atherosclerosis (ICD10-I70.0) and Emphysema (ICD10-J43.9). These results will be called to the ordering clinician or representative by the Radiologist Assistant, and communication documented in the PACS or [employer]. Electronically Signed   By: Jerilynn Mages.  Shick M.D.   On: 01/31/2020 11:49   CT HEAD WO CONTRAST  Result Date: 02/12/2020 CLINICAL DATA:  Facial trauma. Additional provided: Recent fall, right hip pain. EXAM: CT HEAD WITHOUT CONTRAST CT CERVICAL SPINE WITHOUT CONTRAST TECHNIQUE: Multidetector CT imaging of the head and cervical spine was performed following the standard protocol without intravenous contrast. Multiplanar CT image reconstructions of the cervical spine were also generated. COMPARISON:  Head CT 01/21/2019 FINDINGS: CT HEAD FINDINGS Brain: Stable, moderate generalized parenchymal atrophy. Stable, moderate patchy and ill-defined hypoattenuation within the cerebral white matter which is nonspecific, but consistent with chronic small vessel ischemic disease. There is no acute intracranial hemorrhage. No demarcated cortical infarct. No extra-axial fluid collection. No evidence of intracranial mass. No midline shift. Vascular: No hyperdense vessel.  Atherosclerotic calcifications. Skull: No calvarial fracture or focal suspicious osseous lesion. Sinuses/Orbits: Visualized orbits show no acute finding. Mild ethmoid sinus mucosal thickening. No significant mastoid effusion CT CERVICAL SPINE FINDINGS  Alignment: Straightening of the expected cervical lordosis. Trace C3-C4 grade 1 anterolisthesis. Skull base and vertebrae: The basion-dental and atlanto-dental intervals are maintained.No evidence of acute fracture to the cervical spine. Mild age-indeterminate T3 superior endplate compression deformity. Soft tissues and spinal canal: No prevertebral fluid or swelling. No visible canal hematoma. Disc levels: Cervical spondylosis with multilevel disc space narrowing, posterior disc osteophytes, uncovertebral and facet hypertrophy. Disc space narrowing is advanced at C4-C5, C5-C6 and C6-C7. A C5-C6 posterior disc osteophyte complex contributes to at least mild/moderate spinal canal stenosis. Upper chest: No consolidation within the imaged lung apices. No visible pneumothorax atherosclerotic calcifications within the visualized aortic arch and proximal major branch vessels of the wit neck as well as bilateral carotid arteries. IMPRESSION: CT head: 1. No evidence of acute intracranial abnormality. 2. Stable moderate generalized parenchymal atrophy and chronic small vessel ischemic disease. 3. Mild ethmoid sinus mucosal thickening. CT cervical spine: 1. No evidence of acute fracture to the  cervical spine. 2. Age-indeterminate mild T3 superior endplate compression fracture. 3. Cervical spondylosis as outlined. Electronically Signed   By: Kellie Simmering DO   On: 02/12/2020 09:31   CT CHEST W CONTRAST  Result Date: 02/11/2020 CLINICAL DATA:  79 year old female with history of unintended weight loss. EXAM: CT CHEST, ABDOMEN, AND PELVIS WITH CONTRAST TECHNIQUE: Multidetector CT imaging of the chest, abdomen and pelvis was performed following the standard protocol during bolus administration of intravenous contrast. CONTRAST:  74mL OMNIPAQUE IOHEXOL 300 MG/ML  SOLN COMPARISON:  No priors. FINDINGS: CT CHEST FINDINGS Cardiovascular: Heart size is normal. There is no significant pericardial fluid, thickening or pericardial  calcification. There is aortic atherosclerosis, as well as atherosclerosis of the great vessels of the mediastinum and the coronary arteries, including calcified atherosclerotic plaque in the left main, left anterior descending, left circumflex and right coronary arteries. Mediastinum/Nodes: No pathologically enlarged mediastinal or hilar lymph nodes. Esophagus is unremarkable in appearance. No axillary lymphadenopathy. Lungs/Pleura: In the left lower lobe (axial image 40 of series 3 and coronal image 69 of series 6) there is a thick-walled cavitary mass measuring approximately 4.0 x 3.5 x 6.2 cm. This mass has macrolobulated and slightly spiculated margins. Internal air-fluid level. Trace left pleural effusion lying dependently. Additional mass-like area of architectural distortion in the base of the right lower lobe (axial image 129 of series 5) measuring 3.9 x 1.4 cm, favored to reflect atelectasis given adjacent areas of mucoid impaction and linear architectural distortion. Musculoskeletal: Chronic appearing compression fracture of T11 with 60% loss of anterior vertebral body height. There are no aggressive appearing lytic or blastic lesions noted in the visualized portions of the skeleton. CT ABDOMEN PELVIS FINDINGS Hepatobiliary: No suspicious cystic or solid hepatic lesions. No intra or extrahepatic biliary ductal dilatation. Gallbladder is normal in appearance. Pancreas: No pancreatic mass. No pancreatic ductal dilatation. No pancreatic or peripancreatic fluid collections or inflammatory changes. Spleen: Unremarkable. Adrenals/Urinary Tract: Severe atrophy of the left kidney. Right kidney and bilateral adrenal glands are normal in appearance. No hydroureteronephrosis. Urinary bladder is moderately distended, but otherwise unremarkable in appearance. Stomach/Bowel: Normal appearance of the stomach. No pathologic dilatation of small bowel or colon. Numerous colonic diverticulae are noted, particularly in the  sigmoid colon, without surrounding inflammatory changes to suggest an acute diverticulitis at this time. The appendix is not confidently identified and may be surgically absent. Regardless, there are no inflammatory changes noted adjacent to the cecum to suggest the presence of an acute appendicitis at this time. Vascular/Lymphatic: Aortic atherosclerosis, without evidence of aneurysm or dissection in the abdominal or pelvic vasculature. No lymphadenopathy noted in the abdomen or pelvis. Reproductive: Uterus and ovaries are atrophic. Other: No significant volume of ascites.  No pneumoperitoneum. Musculoskeletal: Right femoral neck fracture which appears foreshortened and slightly rotated. There are no aggressive appearing lytic or blastic lesions noted in the visualized portions of the skeleton. IMPRESSION: 1. 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 Trace left pleural effusion is likely malignant. 2. Additional mass-like area of architectural distortion in the base of the right lower lobe, favored to represent an area of possible rounded atelectasis, although close attention on follow-up studies is recommended to ensure stability or resolution. 3. No definite signs of metastatic disease to the abdomen or pelvis. 4. Right femoral neck fracture which is mildly rotated and foreshortened. 5. Colonic diverticulosis without evidence of acute diverticulitis at  this time. 6. Aortic atherosclerosis, in addition to left main and 3 vessel coronary artery disease. 7. Additional incidental findings, as above. Electronically Signed   By: Vinnie Langton M.D.   On: 02/11/2020 16:28   CT CERVICAL SPINE WO CONTRAST  Result Date: 02/12/2020 CLINICAL DATA:  Facial trauma. Additional provided: Recent fall, right hip pain. EXAM: CT HEAD WITHOUT CONTRAST CT CERVICAL SPINE WITHOUT CONTRAST TECHNIQUE: Multidetector  CT imaging of the head and cervical spine was performed following the standard protocol without intravenous contrast. Multiplanar CT image reconstructions of the cervical spine were also generated. COMPARISON:  Head CT 01/21/2019 FINDINGS: CT HEAD FINDINGS Brain: Stable, moderate generalized parenchymal atrophy. Stable, moderate patchy and ill-defined hypoattenuation within the cerebral white matter which is nonspecific, but consistent with chronic small vessel ischemic disease. There is no acute intracranial hemorrhage. No demarcated cortical infarct. No extra-axial fluid collection. No evidence of intracranial mass. No midline shift. Vascular: No hyperdense vessel.  Atherosclerotic calcifications. Skull: No calvarial fracture or focal suspicious osseous lesion. Sinuses/Orbits: Visualized orbits show no acute finding. Mild ethmoid sinus mucosal thickening. No significant mastoid effusion CT CERVICAL SPINE FINDINGS Alignment: Straightening of the expected cervical lordosis. Trace C3-C4 grade 1 anterolisthesis. Skull base and vertebrae: The basion-dental and atlanto-dental intervals are maintained.No evidence of acute fracture to the cervical spine. Mild age-indeterminate T3 superior endplate compression deformity. Soft tissues and spinal canal: No prevertebral fluid or swelling. No visible canal hematoma. Disc levels: Cervical spondylosis with multilevel disc space narrowing, posterior disc osteophytes, uncovertebral and facet hypertrophy. Disc space narrowing is advanced at C4-C5, C5-C6 and C6-C7. A C5-C6 posterior disc osteophyte complex contributes to at least mild/moderate spinal canal stenosis. Upper chest: No consolidation within the imaged lung apices. No visible pneumothorax atherosclerotic calcifications within the visualized aortic arch and proximal major branch vessels of the wit neck as well as bilateral carotid arteries. IMPRESSION: CT head: 1. No evidence of acute intracranial abnormality. 2. Stable  moderate generalized parenchymal atrophy and chronic small vessel ischemic disease. 3. Mild ethmoid sinus mucosal thickening. CT cervical spine: 1. No evidence of acute fracture to the cervical spine. 2. Age-indeterminate mild T3 superior endplate compression fracture. 3. Cervical spondylosis as outlined. Electronically Signed   By: Kellie Simmering DO   On: 02/12/2020 09:31   CT ABDOMEN PELVIS W CONTRAST  Result Date: 02/11/2020 CLINICAL DATA:  79 year old female with history of unintended weight loss. EXAM: CT CHEST, ABDOMEN, AND PELVIS WITH CONTRAST TECHNIQUE: Multidetector CT imaging of the chest, abdomen and pelvis was performed following the standard protocol during bolus administration of intravenous contrast. CONTRAST:  65mL OMNIPAQUE IOHEXOL 300 MG/ML  SOLN COMPARISON:  No priors. FINDINGS: CT CHEST FINDINGS Cardiovascular: Heart size is normal. There is no significant pericardial fluid, thickening or pericardial calcification. There is aortic atherosclerosis, as well as atherosclerosis of the great vessels of the mediastinum and the coronary arteries, including calcified atherosclerotic plaque in the left main, left anterior descending, left circumflex and right coronary arteries. Mediastinum/Nodes: No pathologically enlarged mediastinal or hilar lymph nodes. Esophagus is unremarkable in appearance. No axillary lymphadenopathy. Lungs/Pleura: In the left lower lobe (axial image 40 of series 3 and coronal image 69 of series 6) there is a thick-walled cavitary mass measuring approximately 4.0 x 3.5 x 6.2 cm. This mass has macrolobulated and slightly spiculated margins. Internal air-fluid level. Trace left pleural effusion lying dependently. Additional mass-like area of architectural distortion in the base of the right lower lobe (axial image 129 of series 5) measuring 3.9 x  1.4 cm, favored to reflect atelectasis given adjacent areas of mucoid impaction and linear architectural distortion. Musculoskeletal:  Chronic appearing compression fracture of T11 with 60% loss of anterior vertebral body height. There are no aggressive appearing lytic or blastic lesions noted in the visualized portions of the skeleton. CT ABDOMEN PELVIS FINDINGS Hepatobiliary: No suspicious cystic or solid hepatic lesions. No intra or extrahepatic biliary ductal dilatation. Gallbladder is normal in appearance. Pancreas: No pancreatic mass. No pancreatic ductal dilatation. No pancreatic or peripancreatic fluid collections or inflammatory changes. Spleen: Unremarkable. Adrenals/Urinary Tract: Severe atrophy of the left kidney. Right kidney and bilateral adrenal glands are normal in appearance. No hydroureteronephrosis. Urinary bladder is moderately distended, but otherwise unremarkable in appearance. Stomach/Bowel: Normal appearance of the stomach. No pathologic dilatation of small bowel or colon. Numerous colonic diverticulae are noted, particularly in the sigmoid colon, without surrounding inflammatory changes to suggest an acute diverticulitis at this time. The appendix is not confidently identified and may be surgically absent. Regardless, there are no inflammatory changes noted adjacent to the cecum to suggest the presence of an acute appendicitis at this time. Vascular/Lymphatic: Aortic atherosclerosis, without evidence of aneurysm or dissection in the abdominal or pelvic vasculature. No lymphadenopathy noted in the abdomen or pelvis. Reproductive: Uterus and ovaries are atrophic. Other: No significant volume of ascites.  No pneumoperitoneum. Musculoskeletal: Right femoral neck fracture which appears foreshortened and slightly rotated. There are no aggressive appearing lytic or blastic lesions noted in the visualized portions of the skeleton. IMPRESSION: 1. 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 Trace left pleural effusion  is likely malignant. 2. Additional mass-like area of architectural distortion in the base of the right lower lobe, favored to represent an area of possible rounded atelectasis, although close attention on follow-up studies is recommended to ensure stability or resolution. 3. No definite signs of metastatic disease to the abdomen or pelvis. 4. Right femoral neck fracture which is mildly rotated and foreshortened. 5. Colonic diverticulosis without evidence of acute diverticulitis at this time. 6. Aortic atherosclerosis, in addition to left main and 3 vessel coronary artery disease. 7. Additional incidental findings, as above. Electronically Signed   By: Vinnie Langton M.D.   On: 02/11/2020 16:28   Pelvis Portable  Result Date: 02/13/2020 CLINICAL DATA:  Post right hip arthroplasty. EXAM: PORTABLE PELVIS 1-2 VIEWS COMPARISON:  Preoperative radiographs FINDINGS: Right hip arthroplasty in expected alignment. No periprosthetic lucency or fracture. Recent postsurgical change includes air and edema in the joint space and soft tissues. Lateral skin staples. Increased density in the urinary bladder suggestive of excretion of IV contrast from prior CT. IMPRESSION: Right hip arthroplasty without immediate postoperative complication. Electronically Signed   By: Keith Rake M.D.   On: 02/13/2020 18:21   DG C-Arm 1-60 Min  Result Date: 02/13/2020 CLINICAL DATA:  Right hip replacement. EXAM: DG C-ARM 1-60 MIN; OPERATIVE RIGHT HIP WITH PELVIS FLUOROSCOPY TIME:  Fluoroscopy Time:  18 seconds Radiation Exposure Index (if provided by the fluoroscopic device): 1.375 mGy Number of Acquired Spot Images: 3 COMPARISON:  Preoperative radiograph 02/11/2020 FINDINGS: Three fluoroscopic spot images obtained in the operating room. Interval right hip arthroplasty. Fluoroscopy time 18 seconds. Dose 1.375 mGy. IMPRESSION: Fluoroscopic spot views after right hip arthroplasty. Electronically Signed   By: Keith Rake M.D.   On:  02/13/2020 18:20   DG HIP OPERATIVE UNILAT W OR W/O PELVIS RIGHT  Result Date: 02/13/2020 CLINICAL DATA:  Right hip replacement. EXAM: DG C-ARM 1-60 MIN; OPERATIVE RIGHT HIP WITH PELVIS FLUOROSCOPY TIME:  Fluoroscopy Time:  18 seconds Radiation Exposure Index (if provided by the fluoroscopic device): 1.375 mGy Number of Acquired Spot Images: 3 COMPARISON:  Preoperative radiograph 02/11/2020 FINDINGS: Three fluoroscopic spot images obtained in the operating room. Interval right hip arthroplasty. Fluoroscopy time 18 seconds. Dose 1.375 mGy. IMPRESSION: Fluoroscopic spot views after right hip arthroplasty. Electronically Signed   By: Keith Rake M.D.   On: 02/13/2020 18:20   DG Hip Unilat With Pelvis 2-3 Views Right  Result Date: 02/11/2020 CLINICAL DATA:  Possible hip fracture EXAM: DG HIP (WITH OR WITHOUT PELVIS) 2-3V RIGHT COMPARISON:  None. FINDINGS: Osteopenia. Impacted and foreshortened transcervical or basicervical fracture of the right femoral neck. No other displaced fracture or dislocation of the pelvis. The proximal left femur is intact in single view. IMPRESSION: Impacted and foreshortened transcervical or basicervical fracture of the right femoral neck. No other displaced fracture or dislocation of the pelvis. The proximal left femur is intact in single view. Electronically Signed   By: Eddie Candle M.D.   On: 02/11/2020 11:55   VAS US CAROTID  Result Date: 02/21/2020 Carotid Arterial Duplex Study Indications:       Syncope and orthostatic hypotension. Risk Factors:      Hypertension. Comparison Study:  No prior study Performing Technologist: Maudry Mayhew MHA, RDMS, RVT, RDCS  Examination Guidelines: A complete evaluation includes B-mode imaging, spectral Doppler, color Doppler, and power Doppler as needed of all accessible portions of each vessel. Bilateral testing is considered an integral part of a complete examination. Limited examinations for reoccurring indications may be  performed as noted.  Right Carotid Findings: +----------+-------+-------+--------+---------------------------------+--------+           PSV    EDV    StenosisPlaque Description               Comments           cm/s   cm/s                                                     +----------+-------+-------+--------+---------------------------------+--------+ CCA Prox  66     10                                                       +----------+-------+-------+--------+---------------------------------+--------+ CCA Distal51     11             calcific, heterogenous and                                                irregular                                 +----------+-------+-------+--------+---------------------------------+--------+ ICA Prox  96     21             calcific                                  +----------+-------+-------+--------+---------------------------------+--------+  ICA Distal82     22                                                       +----------+-------+-------+--------+---------------------------------+--------+ ECA       65     11                                                       +----------+-------+-------+--------+---------------------------------+--------+ +----------+--------+-------+----------------+-------------------+           PSV cm/sEDV cmsDescribe        Arm Pressure (mmHG) +----------+--------+-------+----------------+-------------------+ [id number]             Multiphasic, WNL                    +----------+--------+-------+----------------+-------------------+ +---------+--------+--+--------+--+---------+ VertebralPSV cm/s57EDV cm/s15Antegrade +---------+--------+--+--------+--+---------+  Left Carotid Findings: +----------+-------+-------+--------+---------------------------------+--------+           PSV    EDV    StenosisPlaque Description               Comments           cm/s   cm/s                                                      +----------+-------+-------+--------+---------------------------------+--------+ CCA Prox  66     12             heterogenous, irregular and                                               calcific                                  +----------+-------+-------+--------+---------------------------------+--------+ CCA Distal49     9                                                        +----------+-------+-------+--------+---------------------------------+--------+ ICA Prox  143    33             heterogenous and calcific                 +----------+-------+-------+--------+---------------------------------+--------+ ICA Distal79     19                                                       +----------+-------+-------+--------+---------------------------------+--------+ ECA       64     8              heterogenous  and calcific                 +----------+-------+-------+--------+---------------------------------+--------+ +----------+--------+--------+----------------+-------------------+           PSV cm/sEDV cm/sDescribe        Arm Pressure (mmHG) +----------+--------+--------+----------------+-------------------+ [id number]             Multiphasic, WNL                    +----------+--------+--------+----------------+-------------------+ +---------+--------+--+--------+--+---------+ VertebralPSV cm/s92EDV cm/s17Antegrade +---------+--------+--+--------+--+---------+   Summary: Right Carotid: Velocities in the right ICA are consistent with a 1-39% stenosis. Left Carotid: Velocities in the left ICA are consistent with a 1-39% stenosis,               upper end of range. Vertebrals:  Bilateral vertebral arteries demonstrate antegrade flow. Subclavians: Normal flow hemodynamics were seen in bilateral subclavian              arteries. *See table(s) above for measurements and observations.  Electronically signed  by Harold Barban MD on 02/21/2020 at 4:56:11 PM.    Final      Subjective: No acute issues or events overnight, patient able to ambulate with physical therapy, orthostatic blood pressure continues to be somewhat low but patient remains asymptomatic now denies nausea, vomiting, diarrhea, constipation, headache, fevers, chills.   Discharge Exam: Vitals:   02/23/20 0747 02/23/20 1100  BP: (!) 152/80   Pulse: 91   Resp: 17   Temp: 98.4 F (36.9 C)   SpO2: 100% 100%   Vitals:   02/22/20 1948 02/23/20 0413 02/23/20 0747 02/23/20 1100  BP: (!) 165/76 (!) 164/77 (!) 152/80   Pulse: (!) 101 97 91   Resp: 16 17 17    Temp: 98.5 F (36.9 C) 98.1 F (36.7 C) 98.4 F (36.9 C)   TempSrc: Oral Oral Oral   SpO2: 99% 98% 100% 100%  Weight:      Height:        General: Pt is alert, awake, not in acute distress Cardiovascular: RRR, S1/S2 +, no rubs, no gallops Respiratory: CTA bilaterally, no wheezing, no rhonchi Abdominal: Soft, NT, ND, bowel sounds + Extremities: no edema, no cyanosis    The results of significant diagnostics from this hospitalization (including imaging, microbiology, ancillary and laboratory) are listed below for reference.     Microbiology: Recent Results (from the past 240 hour(s))  SARS CORONAVIRUS 2 (TAT 6-24 HRS) Nasopharyngeal Nasopharyngeal Swab     Status: None   Collection Time: 02/18/20  3:19 PM   Specimen: Nasopharyngeal Swab  Result Value Ref Range Status   SARS Coronavirus 2 NEGATIVE NEGATIVE Final    Comment: (NOTE) SARS-CoV-2 target nucleic acids are NOT DETECTED.  The SARS-CoV-2 RNA is generally detectable in upper and lower respiratory specimens during the acute phase of infection. Negative results do not preclude SARS-CoV-2 infection, do not rule out co-infections with other pathogens, and should not be used as the sole basis for treatment or other patient management decisions. Negative results must be combined with clinical  observations, patient history, and epidemiological information. The expected result is Negative.  Fact Sheet for Patients: SugarRoll.be  Fact Sheet for Healthcare Providers: https://www.woods-mathews.com/  This test is not yet approved or cleared by the [place] FDA and  has been authorized for detection and/or diagnosis of SARS-CoV-2 by FDA under an Emergency Use Authorization (EUA). This EUA will remain  in effect (meaning this test can be used) for the duration of the COVID-19 declaration under Se  ction 564(b)(1) of the Act, 21 U.S.C. section 360bbb-3(b)(1), unless the authorization is terminated or revoked sooner.  Performed at [facility], [place] [address] [place], [place] [address]   SARS CORONAVIRUS 2 (TAT 6-24 HRS) Nasopharyngeal Nasopharyngeal Swab     Status: None   Collection Time: 02/22/20  5:00 PM   Specimen: Nasopharyngeal Swab  Result Value Ref Range Status   SARS Coronavirus 2 NEGATIVE NEGATIVE Final    Comment: (NOTE) SARS-CoV-2 target nucleic acids are NOT DETECTED.  The SARS-CoV-2 RNA is generally detectable in upper and lower respiratory specimens during the acute phase of infection. Negative results do not preclude SARS-CoV-2 infection, do not rule out co-infections with other pathogens, and should not be used as the sole basis for treatment or other patient management decisions. Negative results must be combined with clinical observations, patient history, and epidemiological information. The expected result is Negative.  Fact Sheet for Patients: SugarRoll.be  Fact Sheet for Healthcare Providers: https://www.woods-mathews.com/  This test is not yet approved or cleared by the [place] FDA and  has been authorized for detection and/or diagnosis of SARS-CoV-2 by FDA under an Emergency Use Authorization (EUA). This EUA will remain  in effect (meaning this  test can be used) for the duration of the COVID-19 declaration under Se ction 564(b)(1) of the Act, 21 U.S.C. section 360bbb-3(b)(1), unless the authorization is terminated or revoked sooner.  Performed at [facility], [place] [address] [place], [place] [address]      Labs: BNP (last 3 results) No results for input(s): BNP in the last 8760 hours. Basic Metabolic Panel: Recent Labs  Lab 02/18/20 0353 02/20/20 0326 02/21/20 0249 02/22/20 0515  NA 139 139 141 141  K 3.9 3.9 3.8 3.9  CL 108 103 107 109  CO2 24 24 25 22   GLUCOSE 97 100* 91 98  BUN 12 14 14 15   CREATININE 1.07* 1.05* 1.00 1.08*  CALCIUM 8.3* 8.6* 8.3* 8.4*  PHOS  --  4.0  --   --    Liver Function Tests: Recent Labs  Lab 02/20/20 0326  ALBUMIN 2.5*   No results for input(s): LIPASE, AMYLASE in the last 168 hours. No results for input(s): AMMONIA in the last 168 hours. CBC: Recent Labs  Lab 02/18/20 0353 02/20/20 0823 02/21/20 0249 02/22/20 0515  WBC 9.2 8.5 10.0 10.1  NEUTROABS 5.0 4.6 5.9  --   HGB 8.5* 8.5* 8.6* 9.3*  HCT 27.7* 27.4* 27.3* 29.5*  MCV 82.2 81.8 82.7 82.9  PLT 432* 547* 598* 636*   Cardiac Enzymes: No results for input(s): CKTOTAL, CKMB, CKMBINDEX, TROPONINI in the last 168 hours. BNP: Invalid input(s): POCBNP CBG: Recent Labs  Lab 02/20/20 2132  GLUCAP 101*   D-Dimer No results for input(s): DDIMER in the last 72 hours. Hgb A1c No results for input(s): HGBA1C in the last 72 hours. Lipid Profile No results for input(s): CHOL, HDL, LDLCALC, TRIG, CHOLHDL, LDLDIRECT in the last 72 hours. Thyroid function studies No results for input(s): TSH, T4TOTAL, T3FREE, THYROIDAB in the last 72 hours.  Invalid input(s): FREET3 Anemia work up No results for input(s): VITAMINB12, FOLATE, FERRITIN, TIBC, IRON, RETICCTPCT in the last 72 hours. Urinalysis    Component Value Date/Time   COLORURINE YELLOW 02/12/2020 1430   APPEARANCEUR CLEAR 02/12/2020 1430   LABSPEC 1.023  02/12/2020 1430   PHURINE 6.0 02/12/2020 1430   GLUCOSEU NEGATIVE 02/12/2020 [place] 09/13/2018 1419   HGBUR NEGATIVE 02/12/2020 1430  BILIRUBINUR NEGATIVE 02/12/2020 1430   BILIRUBINUR negative 01/08/2020 1610   KETONESUR NEGATIVE 02/12/2020 1430   PROTEINUR NEGATIVE 02/12/2020 1430   UROBILINOGEN 0.2 01/08/2020 1610   UROBILINOGEN 0.2 09/13/2018 1419   NITRITE NEGATIVE 02/12/2020 1430   LEUKOCYTESUR NEGATIVE 02/12/2020 1430   Sepsis Labs Invalid input(s): PROCALCITONIN,  WBC,  LACTICIDVEN Microbiology Recent Results (from the past 240 hour(s))  SARS CORONAVIRUS 2 (TAT 6-24 HRS) Nasopharyngeal Nasopharyngeal Swab     Status: None   Collection Time: 02/18/20  3:19 PM   Specimen: Nasopharyngeal Swab  Result Value Ref Range Status   SARS Coronavirus 2 NEGATIVE NEGATIVE Final    Comment: (NOTE) SARS-CoV-2 target nucleic acids are NOT DETECTED.  The SARS-CoV-2 RNA is generally detectable in upper and lower respiratory specimens during the acute phase of infection. Negative results do not preclude SARS-CoV-2 infection, do not rule out co-infections with other pathogens, and should not be used as the sole basis for treatment or other patient management decisions. Negative results must be combined with clinical observations, patient history, and epidemiological information. The expected result is Negative.  Fact Sheet for Patients: SugarRoll.be  Fact Sheet for Healthcare Providers: https://www.woods-mathews.com/  This test is not yet approved or cleared by the [place] FDA and  has been authorized for detection and/or diagnosis of SARS-CoV-2 by FDA under an Emergency Use Authorization (EUA). This EUA will remain  in effect (meaning this test can be used) for the duration of the COVID-19 declaration under Se ction 564(b)(1) of the Act, 21 U.S.C. section 360bbb-3(b)(1), unless the authorization is terminated  or revoked sooner.  Performed at [facility], [place] [address], [place], [place] [address]   SARS CORONAVIRUS 2 (TAT 6-24 HRS) Nasopharyngeal Nasopharyngeal Swab     Status: None   Collection Time: 02/22/20  5:00 PM   Specimen: Nasopharyngeal Swab  Result Value Ref Range Status   SARS Coronavirus 2 NEGATIVE NEGATIVE Final    Comment: (NOTE) SARS-CoV-2 target nucleic acids are NOT DETECTED.  The SARS-CoV-2 RNA is generally detectable in upper and lower respiratory specimens during the acute phase of infection. Negative results do not preclude SARS-CoV-2 infection, do not rule out co-infections with other pathogens, and should not be used as the sole basis for treatment or other patient management decisions. Negative results must be combined with clinical observations, patient history, and epidemiological information. The expected result is Negative.  Fact Sheet for Patients: SugarRoll.be  Fact Sheet for Healthcare Providers: https://www.woods-mathews.com/  This test is not yet approved or cleared by the [place] FDA and  has been authorized for detection and/or diagnosis of SARS-CoV-2 by FDA under an Emergency Use Authorization (EUA). This EUA will remain  in effect (meaning this test can be used) for the duration of the COVID-19 declaration under Se ction 564(b)(1) of the Act, 21 U.S.C. section 360bbb-3(b)(1), unless the authorization is terminated or revoked sooner.  Performed at [facility], [place] [address], [place], [place] [address]      Time coordinating discharge: Over 30 minutes  SIGNED:   Little Ishikawa, DO [facility] 02/23/2020, 12:24 PM Pager   If 7PM-7AM, please contact night-coverage [email or web address]

## 2020-02-23 NOTE — TOC Transition Note (Signed)
Transition of Care [facility]) - CM/SW Discharge Note   Patient Details  Name: Bianca [name] MRN: [id number] Date of Birth: 1940/09/10  [facility] of [facility] CM/SW Contact:  Curlene Labrum, RN Phone Number: 02/23/2020, 1:59 PM   Clinical Narrative:    Case management spoke with [facility] and patient is to be discharged at 2 pm today and PTAR was arranged and patient and daughter, Bianca [name], are aware.  Angelito, RN will be calling report to [facility] at  [phone] for room 213.  PTAR was scheduled for 2 pm.   Final next level of care: Skilled Nursing Facility Barriers to Discharge: Insurance Authorization   Patient Goals and CMS Choice Patient states their goals for this hospitalization and ongoing recovery are:: Plans to transfer to a [facility] is first choice      Discharge Placement                       Discharge Plan and Services   Discharge Planning Services: CM Consult Post Acute Care Choice: [place]                               Social Determinants of Health (SDOH) Interventions     Readmission Risk Interventions No flowsheet data found.

## 2020-02-23 NOTE — Progress Notes (Signed)
Progress Note  Patient Name: KRITHI BRAY Date of Encounter: 02/23/2020  [facility] Cardiologist: Cristopher Peru, MD   Subjective   Orthostatic BPs much improved yesterday after starting Mestinon.  Proamatine decreased due to supine HTN.  Denies any dizziness or syncope.   Inpatient Medications    Scheduled Meds: . apixaban  2.5 mg Oral BID  . busPIRone  15 mg Oral BID  . cholecalciferol  2,000 Units Oral Daily  . feeding supplement (ENSURE ENLIVE)  237 mL Oral TID BM  . ferrous gluconate  324 mg Oral BID WC  . midodrine  2.5 mg Oral TID WC  . mirtazapine  15 mg Oral QHS  . mometasone-formoterol  2 puff Inhalation BID  . multivitamin with minerals  1 tablet Oral Daily  . nicotine  7 mg Transdermal Daily  . pyridostigmine  30 mg Oral BID  . senna  1 tablet Oral BID   Continuous Infusions:  PRN Meds: acetaminophen, acetaminophen, albuterol, menthol-cetylpyridinium **OR** phenol, methocarbamol **OR** [DISCONTINUED] methocarbamol (ROBAXIN) IV, ondansetron **OR** ondansetron (ZOFRAN) IV   Vital Signs    Vitals:   02/22/20 1637 02/22/20 1909 02/22/20 1948 02/23/20 0413  BP: 109/63  (!) 165/76 (!) 164/77  Pulse: (!) 101  (!) 101 97  Resp:   16 17  Temp:   98.5 F (36.9 C) 98.1 F (36.7 C)  TempSrc:   Oral Oral  SpO2: 98% 97% 99% 98%  Weight:      Height:        Intake/Output Summary (Last 24 hours) at 02/23/2020 0737 Last data filed at 02/23/2020 0000 Gross per 24 hour  Intake --  Output 300 ml  Net -300 ml   Last 3 Weights 02/13/2020 02/11/2020 01/30/2020  Weight (lbs) 105 lb 105 lb 105 lb  Weight (kg) 47.628 kg 47.628 kg 47.628 kg      Telemetry    NSR - personally reviewed  ECG    No new ECG tracing today. - Personally Reviewed  Physical Exam   GEN: Well nourished, well developed in no acute distress HEENT: Normal NECK: No JVD; No carotid bruits LYMPHATICS: No lymphadenopathy CARDIAC:RRR, no murmurs, rubs, gallops RESPIRATORY:  Clear to  auscultation without rales, wheezing or rhonchi  ABDOMEN: Soft, non-tender, non-distended MUSCULOSKELETAL:  No edema; No deformity  SKIN: Warm and dry NEUROLOGIC:  Alert and oriented x 3 PSYCHIATRIC:  Normal affect    Labs    High Sensitivity Troponin:  No results for input(s): TROPONINIHS in the last 720 hours.    Chemistry Recent Labs  Lab 02/20/20 0326 02/21/20 0249 02/22/20 0515  NA 139 141 141  K 3.9 3.8 3.9  CL 103 107 109  CO2 24 25 22   GLUCOSE 100* 91 98  BUN 14 14 15   CREATININE 1.05* 1.00 1.08*  CALCIUM 8.6* 8.3* 8.4*  ALBUMIN 2.5*  --   --   GFRNONAA 50* 53* 48*  GFRAA 58* >60 56*  ANIONGAP 12 9 10      Hematology Recent Labs  Lab 02/20/20 0823 02/21/20 0249 02/22/20 0515  WBC 8.5 10.0 10.1  RBC 3.35* 3.30* 3.56*  HGB 8.5* 8.6* 9.3*  HCT 27.4* 27.3* 29.5*  MCV 81.8 82.7 82.9  MCH 25.4* 26.1 26.1  MCHC 31.0 31.5 31.5  RDW 14.9 15.1 15.3  PLT 547* 598* 636*    BNPNo results for input(s): BNP, PROBNP in the last 168 hours.   DDimer No results for input(s): DDIMER in the last 168 hours.   Radiology  VAS US CAROTID  Result Date: 02/21/2020 Carotid Arterial Duplex Study Indications:       Syncope and orthostatic hypotension. Risk Factors:      Hypertension. Comparison Study:  No prior study Performing Technologist: Maudry Mayhew MHA, RDMS, RVT, RDCS  Examination Guidelines: A complete evaluation includes B-mode imaging, spectral Doppler, color Doppler, and power Doppler as needed of all accessible portions of each vessel. Bilateral testing is considered an integral part of a complete examination. Limited examinations for reoccurring indications may be performed as noted.  Right Carotid Findings: +----------+-------+-------+--------+---------------------------------+--------+           PSV    EDV    StenosisPlaque Description               Comments           cm/s   cm/s                                                      +----------+-------+-------+--------+---------------------------------+--------+ CCA Prox  66     10                                                       +----------+-------+-------+--------+---------------------------------+--------+ CCA Distal51     11             calcific, heterogenous and                                                irregular                                 +----------+-------+-------+--------+---------------------------------+--------+ ICA Prox  96     21             calcific                                  +----------+-------+-------+--------+---------------------------------+--------+ ICA Distal82     22                                                       +----------+-------+-------+--------+---------------------------------+--------+ ECA       65     11                                                       +----------+-------+-------+--------+---------------------------------+--------+ +----------+--------+-------+----------------+-------------------+           PSV cm/sEDV cmsDescribe        Arm Pressure (mmHG) +----------+--------+-------+----------------+-------------------+ [id number]             Multiphasic, WNL                    +----------+--------+-------+----------------+-------------------+ +---------+--------+--+--------+--+---------+  VertebralPSV cm/s57EDV cm/s15Antegrade +---------+--------+--+--------+--+---------+  Left Carotid Findings: +----------+-------+-------+--------+---------------------------------+--------+           PSV    EDV    StenosisPlaque Description               Comments           cm/s   cm/s                                                     +----------+-------+-------+--------+---------------------------------+--------+ CCA Prox  66     12             heterogenous, irregular and                                               calcific                                   +----------+-------+-------+--------+---------------------------------+--------+ CCA Distal49     9                                                        +----------+-------+-------+--------+---------------------------------+--------+ ICA Prox  143    33             heterogenous and calcific                 +----------+-------+-------+--------+---------------------------------+--------+ ICA Distal79     19                                                       +----------+-------+-------+--------+---------------------------------+--------+ ECA       64     8              heterogenous and calcific                 +----------+-------+-------+--------+---------------------------------+--------+ +----------+--------+--------+----------------+-------------------+           PSV cm/sEDV cm/sDescribe        Arm Pressure (mmHG) +----------+--------+--------+----------------+-------------------+ [id number]             Multiphasic, WNL                    +----------+--------+--------+----------------+-------------------+ +---------+--------+--+--------+--+---------+ VertebralPSV cm/s92EDV cm/s17Antegrade +---------+--------+--+--------+--+---------+   Summary: Right Carotid: Velocities in the right ICA are consistent with a 1-39% stenosis. Left Carotid: Velocities in the left ICA are consistent with a 1-39% stenosis,               upper end of range. Vertebrals:  Bilateral vertebral arteries demonstrate antegrade flow. Subclavians: Normal flow hemodynamics were seen in bilateral subclavian              arteries. *See table(s) above for measurements and observations.  Electronically signed by Harold Barban MD on 02/21/2020 at 4:56:11 PM.    Final     Cardiac Studies  Echocardiogram 01/22/2019: Impressions: 1. The left ventricle has normal systolic function with an ejection  fraction of 60-65%. The cavity size was normal. There is moderate  concentric left ventricular  hypertrophy. Left ventricular diastolic  Doppler parameters are consistent with  pseudonormalization. Elevated left atrial and left ventricular  end-diastolic pressures.  2. The right ventricle has normal systolic function. The cavity was  normal. There is no increase in right ventricular wall thickness. Right  ventricular systolic pressure is moderately elevated.  3. Mitral valve regurgitation is moderate by color flow Doppler.  4. The aortic valve is tricuspid. Moderate thickening of the aortic  valve. Moderate calcification of the aortic valve. Aortic valve  regurgitation was not assessed by color flow Doppler.  5. The inferior vena cava was dilated in size with <50% respiratory  variability.   Patient Profile     CHLOEANNE POTEET is a 79 y.o. female with a history of paroxysmal atrial fibrillation on Eliquis, hypertension with orthostatic hypotension on Midodrine as needed at home, COPD, asthma, anxiety, tobacco abuse, and recently diagnosed lung mass concerning for cancer who is being seen today for the evaluation of orthostatis at the request of Dr/ Force.  Assessment & Plan    Orthostatic Hypotension  - Positive orthostatic the last 2 days with systolic BP dropping as much as 60 mmHg going from lying to sitting.  - Florinef stopped due to HTN and abdominal binder and thigh high compression stockings placed.  - Decreased Midodrine to 2.5mg  TID (7am, 12pm, and 5pm to avoid supine hypertension at night) yesterday since BP elevated and HTN has improved - AM Cortisol level normal. - carotid dopplers 1-39% stenosis with normal vertebral flow - Will recheck orthostatic vitals signs today with compression stocking,abdominal binder in place.  If orthostatics ok then ok tod discharge home.  If orthostatic then will increase Mestinon further and get Neuro eval to rule out occult neurological disease (Parkinson's).  Paroxysmal Atrial Fibrillation - maintaining NSR on tele - Will not  add beta-blocker at this time given significant orthostatic hypotension. - Continue low dose Eliquis 2.5mg  twice daily.  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

## 2020-02-23 NOTE — Consult Note (Addendum)
Neurology Consultation  Reason for Consult: Orthostatic blood pressures Referring Physician: Golden Hurter  CC: Orthostatic blood pressures  History is obtained from: Daughter  HPI: Bianca [name] is a 79 y.o. female with past medical history of hypertension, atrial fibrillation on Eliquis, hard of hearing, COPD and orthostatic blood pressures. Patient was admitted a few days ago secondary to hip fracture after she fell and passed out. Unfortunately she does not remember falling. On admission she was hypertensive at 192/97. Status post hip surgery it was noticed that her blood pressure went from 117/67 down to 74/52 with near syncope. At home she was already on midodrine due to orthostasis. Per daughter she has been taking her blood pressure on a daily basis and usually runs in the 150s however in the mornings her systolic blood pressure can drop to the 120s. Daughter says over the last 15 years she has had these episodes where she felt funny when out in the sun and seem to days off at times. At that time they did not know that her blood pressure was dropping slightly. It was last year in July that they found her in bed after getting up and noted her blood pressure was 60/45. Per daughter-at that time patient had gotten up to open blinds and then went back to her room which is where they found her on her bed and took the blood pressure. Patient does not recall that moment. It was at that time that they also found out that patient had atrial fibrillation. Daughter states that the majority of her blood pressures going down are when she goes from a sitting to standing position and or stands for prolonged period of time. Daughter states that she started to keep a log of her blood pressures and got patient into a routine to the point where she would sit at the edge of the bed take her midodrine and then get up. During that period of time she appeared to be able to function without difficulty.  On 8/3 she was  about to be discharged when she had significant orthostasis with her blood pressure dropping from 117/67 when laying down which dropped to 74/52 while standing associated with near syncope. Second time orthostatic blood pressures were observed to go from 173/84 when lying down to 104/69 while sitting. Patient remains to have significant orthostasis when orthostatic blood pressure taken. The last few are as follows :  Laying 167/83, sitting 144/83, standing 170/87, standing at 3 minutes 109/63  Orthostatic sitting 150/84, orthostatic standing 0 minutes 124/65 and 3 minutes later 108/60  On 8/5 patient was still orthostatic despite abdominal binder and compression hose. Her midodrine was lowered due to supine hypertension however Mestinon was added. With the addition of Mestinon patient improved with her orthostatic blood pressures.  Neurology was asked see the patient to give any input if this could be neurologically mediated.   Past Medical History:  Diagnosis Date  . Anxiety   . Arthritis   . Asthma   . COPD (chronic obstructive pulmonary disease) ([place])   . HOH (hard of hearing)   . Hypertension   . Wears dentures    top and partial bottom  . Wears glasses     Family History  Problem Relation Age of Onset  . CAD Mother        possible small heart attack  . Thyroid disease Sister        she thinks hyperthyroidism   Social History:   reports that she  has been smoking cigarettes. She has been smoking about 0.25 packs per day. She has never used smokeless tobacco. She reports that she does not drink alcohol and does not use drugs.  Medications  Current Facility-Administered Medications:  .  acetaminophen (TYLENOL) tablet 325-650 mg, 325-650 mg, Oral, Q6H PRN, Mcarthur Rossetti, MD .  acetaminophen (TYLENOL) tablet 650 mg, 650 mg, Oral, Q6H PRN, Mcarthur Rossetti, MD, 650 mg at 02/13/20 0946 .  albuterol (PROVENTIL) (2.5 MG/3ML) 0.083% nebulizer solution 3 mL, 3 mL,  Inhalation, Q4H PRN, Mcarthur Rossetti, MD, 3 mL at 02/15/20 2328 .  apixaban (ELIQUIS) tablet 2.5 mg, 2.5 mg, Oral, BID, Samtani, Jai-Gurmukh, MD, 2.5 mg at 02/23/20 1053 .  busPIRone (BUSPAR) tablet 15 mg, 15 mg, Oral, BID, Mcarthur Rossetti, MD, 15 mg at 02/23/20 1052 .  cholecalciferol (VITAMIN D3) tablet 2,000 Units, 2,000 Units, Oral, Daily, Jessy Oto, MD, 2,000 Units at 02/23/20 1053 .  feeding supplement (ENSURE ENLIVE) (ENSURE ENLIVE) liquid 237 mL, 237 mL, Oral, TID BM, Mcarthur Rossetti, MD, 237 mL at 02/23/20 1043 .  ferrous gluconate (FERGON) tablet 324 mg, 324 mg, Oral, BID WC, Jessy Oto, MD, 324 mg at 02/23/20 0807 .  menthol-cetylpyridinium (CEPACOL) lozenge 3 mg, 1 lozenge, Oral, PRN **OR** phenol (CHLORASEPTIC) mouth spray 1 spray, 1 spray, Mouth/Throat, PRN, Mcarthur Rossetti, MD .  methocarbamol (ROBAXIN) tablet 500 mg, 500 mg, Oral, Q6H PRN **OR** [DISCONTINUED] methocarbamol (ROBAXIN) 500 mg in dextrose 5 % 50 mL IVPB, 500 mg, Intravenous, Q6H PRN, Mcarthur Rossetti, MD .  midodrine (PROAMATINE) tablet 2.5 mg, 2.5 mg, Oral, TID WC, Turner, Traci R, MD, 2.5 mg at 02/23/20 1229 .  mirtazapine (REMERON) tablet 15 mg, 15 mg, Oral, QHS, Mcarthur Rossetti, MD, 15 mg at 02/22/20 2120 .  mometasone-formoterol (DULERA) 100-5 MCG/ACT inhaler 2 puff, 2 puff, Inhalation, BID, Mcarthur Rossetti, MD, 2 puff at 02/23/20 1059 .  multivitamin with minerals tablet 1 tablet, 1 tablet, Oral, Daily, Jessy Oto, MD, 1 tablet at 02/23/20 1053 .  nicotine (NICODERM CQ - dosed in mg/24 hr) patch 7 mg, 7 mg, Transdermal, Daily, Mcarthur Rossetti, MD, 7 mg at 02/23/20 1058 .  ondansetron (ZOFRAN) tablet 4 mg, 4 mg, Oral, Q6H PRN **OR** ondansetron (ZOFRAN) injection 4 mg, 4 mg, Intravenous, Q6H PRN, Mcarthur Rossetti, MD .  pyridostigmine (MESTINON) tablet 30 mg, 30 mg, Oral, BID, Sarajane Jews, Callie E, PA-C, 30 mg at 02/23/20 1054 .  senna  (SENOKOT) tablet 8.6 mg, 1 tablet, Oral, BID, Mcarthur Rossetti, MD, 8.6 mg at 02/22/20 2120  ROS:    General ROS: negative for - chills, fatigue, fever, night sweats, weight gain or weight loss Psychological ROS: Positive for -memory difficulties Ophthalmic ROS: negative for - blurry vision, double vision, eye pain or loss of vision ENT ROS: negative for - epistaxis, nasal discharge, oral lesions, sore throat, tinnitus or vertigo Allergy and Immunology ROS: negative for - hives or itchy/watery eyes Hematological and Lymphatic ROS: negative for - bleeding problems, bruising or swollen lymph nodes Endocrine ROS: negative for - galactorrhea, hair pattern changes, polydipsia/polyuria or temperature intolerance Respiratory ROS: negative for - cough, hemoptysis, shortness of breath or wheezing Cardiovascular ROS: negative for - chest pain, dyspnea on exertion, edema or irregular heartbeat Gastrointestinal ROS: negative for - abdominal pain, diarrhea, hematemesis, nausea/vomiting or stool incontinence Genito-Urinary ROS: negative for - dysuria, hematuria, incontinence or urinary frequency/urgency Musculoskeletal ROS: negative for - joint swelling or muscular  weakness Neurological ROS: as noted in HPI Dermatological ROS: negative for rash and skin lesion changes  Exam: Current vital signs: BP (!) 152/82 (BP Location: Right Arm)   Pulse 91   Temp 98.3 F (36.8 C) (Oral)   Resp 16   Ht 5' 5"  (1.651 m)   Wt 47.6 kg   LMP  (LMP Unknown)   SpO2 100%   BMI 17.47 kg/m  Vital signs in last 24 hours: Temp:  [98.1 F (36.7 C)-98.5 F (36.9 C)] 98.3 F (36.8 C) (08/06 1100) Pulse Rate:  [91-104] 91 (08/06 0747) Resp:  [16-17] 16 (08/06 1100) BP: (109-170)/(63-87) 152/82 (08/06 1100) SpO2:  [95 %-100 %] 100 % (08/06 1100)   Constitutional: Appears well-developed and well-nourished.  Eyes: No scleral injection HENT: No OP obstrucion Head: Normocephalic.  Cardiovascular:  Palpable Respiratory: Effort normal, non-labored breathing GI: Soft.  No distension. There is no tenderness.  Skin: WDI  Neuro: Mental Status: Patient is awake, alert, oriented to person, place, month, year, and situation. Speech is fluent with no aphasia or dysarthria. Naming, repeating and comprehension are intact. Patient is able to follow simple commands. Cranial Nerves: II: Visual Fields are full.  III,IV, VI: EOMI without ptosis or diploplia. Pupils equal, round and reactive to light V: Facial sensation is symmetric to temperature VII: Facial movement is symmetric.  VIII: Hard of hearing X: Palat elevates symmetrically XI: Shoulder shrug is symmetric. XII: tongue is midline without atrophy or fasciculations.  Motor: Tone is normal.  5/5 strength was present in all four extremities. No cogwheeling, bradykinesia or hypomimia noted No drift Sensory: Sensation is diminished to temp to the mid-thigh, vibration is severely decreased at the toes, mildly decreased at the knees, and normal at the fingers.  Deep Tendon Reflexes: 2+ and symmetric in the biceps and patellae.  Cerebellar: FNF and HKS are intact bilaterally  Labs I have reviewed labs in epic and the results pertinent to this consultation are:  CBC    Component Value Date/Time   WBC 10.1 02/22/2020 0515   RBC 3.56 (L) 02/22/2020 0515   HGB 9.3 (L) 02/22/2020 0515   HGB 10.8 (L) 01/30/2020 1547   HGB 12.5 03/08/2013 1211   HCT 29.5 (L) 02/22/2020 0515   HCT 33.8 (L) 01/30/2020 1547   HCT 37.0 03/08/2013 1211   PLT 636 (H) 02/22/2020 0515   PLT 475 (H) 01/30/2020 1547   MCV 82.9 02/22/2020 0515   MCV 82 01/30/2020 1547   MCV 88.5 03/08/2013 1211   MCH 26.1 02/22/2020 0515   MCHC 31.5 02/22/2020 0515   RDW 15.3 02/22/2020 0515   RDW 13.8 01/30/2020 1547   RDW 14.0 03/08/2013 1211   LYMPHSABS 2.5 02/21/2020 0249   LYMPHSABS 1.6 01/30/2020 1547   LYMPHSABS 1.6 03/08/2013 1211   MONOABS 0.9 02/21/2020 0249    MONOABS 0.6 03/08/2013 1211   EOSABS 0.8 (H) 02/21/2020 0249   EOSABS 0.1 01/30/2020 1547   BASOSABS 0.1 02/21/2020 0249   BASOSABS 0.1 01/30/2020 1547   BASOSABS 0.1 03/08/2013 1211    CMP     Component Value Date/Time   NA 141 02/22/2020 0515   NA 140 10/30/2019 1440   NA 140 03/08/2013 1211   K 3.9 02/22/2020 0515   K 3.6 03/08/2013 1211   CL 109 02/22/2020 0515   CO2 22 02/22/2020 0515   CO2 26 03/08/2013 1211   GLUCOSE 98 02/22/2020 0515   GLUCOSE 84 03/08/2013 1211   BUN 15 02/22/2020 0515  BUN 14 10/30/2019 1440   BUN 15.9 03/08/2013 1211   CREATININE 1.08 (H) 02/22/2020 0515   CREATININE 1.5 (H) 03/08/2013 1211   CALCIUM 8.4 (L) 02/22/2020 0515   CALCIUM 9.5 03/08/2013 1211   PROT 6.2 (L) 02/13/2020 0651   PROT 6.3 10/30/2019 1440   PROT 6.9 03/08/2013 1211   ALBUMIN 2.5 (L) 02/20/2020 0326   ALBUMIN 3.9 10/30/2019 1440   ALBUMIN 3.7 03/08/2013 1211   AST 22 02/13/2020 0651   AST 19 03/08/2013 1211   ALT 13 02/13/2020 0651   ALT 16 03/08/2013 1211   ALKPHOS 64 02/13/2020 0651   ALKPHOS 65 03/08/2013 1211   BILITOT 0.9 02/13/2020 0651   BILITOT 0.2 10/30/2019 1440   BILITOT 0.38 03/08/2013 1211   GFRNONAA 48 (L) 02/22/2020 0515   GFRAA 56 (L) 02/22/2020 0515    Lipid Panel     Component Value Date/Time   CHOL 150 10/30/2019 1440   TRIG 85 10/30/2019 1440   HDL 48 10/30/2019 1440   CHOLHDL 3.1 10/30/2019 1440   CHOLHDL 2.9 01/22/2019 0431   VLDL 17 01/22/2019 0431   LDLCALC 86 10/30/2019 1440   LDLDIRECT 94.8 04/24/2010 1158     Imaging None  Etta Quill PA-C [facility] [phone]  M-F  (9:00 am- 5:00 PM)  02/23/2020, 1:18 PM   I have seen the patient and reviewed the above note.   She has had OH for years. Feels she is getting benefit from mestinon.   Denies dry eyes or dry mouth, denies   Workup:  AM Cortisol 10.1 TSH 2.7 Vit D 35 ESR 68 Ds DNA - negative HIV negative    Assessment:  This is an 79 year old  female with longstanding gradually worsening. orthostatic hypotension presenting after a fall due to same. I suspect that she ha san underlying neuropathy with disproportionate small fiber involvement given the temp loss to the thigh and preserved reflexes. No signs of parkinsonism. The time course seems to slow for multiple system atrophy, though could be a consideration. Will send B12 and A1c, then would pursue EMG to help guide further workup.   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  Roland Rack, MD [facility] [phone]  If 7pm- 7am, please page neurology on call as listed in [place].

## 2020-02-23 NOTE — Progress Notes (Deleted)
Reported crical Na level of 119 to on call physician for internal medicine no orders given at that time.

## 2020-02-23 NOTE — Progress Notes (Signed)
Physical Therapy Treatment Patient Details Name: Bianca [name] MRN: [id number] DOB: Jun 23, 1941 Today's Date: 02/23/2020    History of Present Illness Pt is an 79 yr old female admitted on 02/11/20 with hip fx after fall tripping on deck hitting head on chair and requiring R THA direct anterior approach on 02/13/20. CT head negative. Age-indeterminate mild T3 superior endplate compression fracture. C-Spine: negative. CT abdmomen: LLL lung mass, trace pleural effusion. Of note, pt with cough x4 weeks and weight loss of 10# in a few months. PMHx:  atrial fibrillation, anxiety, COPD, arthritis, h/o breast CA s/p mastectomy.    PT Comments    Pt in bed upon arrival of PT, agreeable to session with focus on progression of gait and tolerance to upright mobility. The pt continues to have slight drop in BP with initial transition to seated activity, but was able to sustain >110/>60 when seated throughout the session. The pt completed multiple sit-stand transfers and stand-pivot transfer to [facility] with minA and RW for stability, and was even able to tolerate a short bout of ambulation (76ft) in the room before needing to sit due to onset of sx related to [facility]. The pt is eager to participate, but demos slightly reduced insight to deficits and safety as she waits on guidance from therapist rather than asking to sit or rest with onset of sx. The pt's sx onset with longer bouts of standing at this point (3+ min), but she is able to safely take steps or complete standing exercises with minA prior to onset of sx and recover with seated rest. The pt will continue to benefit from skilled PT to progress functional activity tolerance and strength prior to return home.   Vitals:  Supine: BP:134/110; HR: 90 bpm Sitting: 111/69; HR: 91 bpm Standing: 74/53; HR: 92 bpm (pt denies sx) Sitting: 112/68; HR: 94 bpm Sitting in recliner post ambulation: 131/71; HR: 92 bpm   Follow Up Recommendations  SNF     Equipment  Recommendations  Rolling walker with 5" wheels    Recommendations for Other Services       Precautions / Restrictions Precautions Precautions: Fall Precaution Comments: x6 times in past 6 months, watch BP (orthostatic hypotension) Restrictions Weight Bearing Restrictions: Yes RLE Weight Bearing: Weight bearing as tolerated Other Position/Activity Restrictions: poor vision    Mobility  Bed Mobility Overal bed mobility: Needs Assistance Bed Mobility: Supine to Sit     Supine to sit: Min assist     General bed mobility comments: minA to complete scoot to EOB. pt denies sx in seated position  Transfers Overall transfer level: Needs assistance Equipment used: Rolling walker (2 wheeled) Transfers: Sit to/from Stand Sit to Stand: Min assist;Min guard         General transfer comment: minG to power up but minA progressing to modA with continued static stand as pt becomes progressively symptomatic (increased blinking, eyes closed, shaking, posterior lean)  Ambulation/Gait Ambulation/Gait assistance: Min assist Gait Distance (Feet): 10 Feet Assistive device: Rolling walker (2 wheeled) Gait Pattern/deviations: Decreased stride length;Step-through pattern;Narrow base of support Gait velocity: decreased Gait velocity interpretation: <1.31 ft/sec, indicative of household ambulator General Gait Details: pt with small, short steps but no LOB during ambulation, benefits from minA to assist with RW management. Able to tolerate short bouts of walking in room without onset of sx   Stairs             Wheelchair Mobility    Modified Rankin (Stroke Patients Only)  Balance Overall balance assessment: Needs assistance Sitting-balance support: Feet supported;No upper extremity supported Sitting balance-Leahy Scale: Fair Sitting balance - Comments: supervision for safety   Standing balance support: Bilateral upper extremity supported;During functional activity Standing  balance-Leahy Scale: Poor Standing balance comment: leaning posteriorly with dizziness. increased shakiness, pt does not verbalize need to sit until directly asked                            Cognition Arousal/Alertness: Awake/alert Behavior During Therapy: WFL for tasks assessed/performed Overall Cognitive Status: Impaired/Different from baseline Area of Impairment: Problem solving;Safety/judgement;Memory                     Memory: Decreased short-term memory;Decreased recall of precautions   Safety/Judgement: Decreased awareness of safety;Decreased awareness of deficits   Problem Solving: Slow processing;Decreased initiation;Requires verbal cues General Comments: Pt with poor insight/expression of sx. Requiring repeated prompts to determine if the pt is expereincing sx related to [facility] and then also needing cues for safety wen sx onset. The pt also required frequent simple commands to follow.      Exercises Total Joint Exercises Heel Slides: AROM;Both;10 reps;Seated Long Arc Quad: AROM;Both;10 reps;Seated Marching in Standing: AROM;Both;10 reps;Standing;Seated (x10 each leg sitting, x10 each leg standing)    General Comments General comments (skin integrity, edema, etc.): BP: 111/69 sitting EOB, to 74/53 when standing, returns to >90/>60 when sitting. 131/71 in recliner with feet down at end of session. HR in low 90s throughout      Pertinent Vitals/Pain Pain Assessment: Faces Faces Pain Scale: Hurts a little bit Pain Location: R hip and leg Pain Descriptors / Indicators: Sore Pain Intervention(s): Monitored during session;Repositioned           PT Goals (current goals can now be found in the care plan section) Acute Rehab PT Goals Patient Stated Goal: to go to SNF PT Goal Formulation: With patient/family Time For Goal Achievement: 02/28/20 Potential to Achieve Goals: Good Progress towards PT goals: Progressing toward goals    Frequency    Min  3X/week      PT Plan Current plan remains appropriate       AM-PAC PT "6 Clicks" Mobility   Outcome Measure  Help needed turning from your back to your side while in a flat bed without using bedrails?: A Little Help needed moving from lying on your back to sitting on the side of a flat bed without using bedrails?: A Little Help needed moving to and from a bed to a chair (including a wheelchair)?: A Little Help needed standing up from a chair using your arms (e.g., wheelchair or bedside chair)?: A Little Help needed to walk in hospital room?: A Little Help needed climbing 3-5 steps with a railing? : A Lot 6 Click Score: 17    End of Session Equipment Utilized During Treatment: Gait belt Activity Tolerance: Treatment limited secondary to medical complications (Comment) Patient left: in chair;with call bell/phone within reach;with family/visitor present Nurse Communication: Mobility status (BPs with mobility) PT Visit Diagnosis: Unsteadiness on feet (R26.81);Pain;Difficulty in walking, not elsewhere classified (R26.2);Other abnormalities of gait and mobility (R26.89) Pain - Right/Left: Right Pain - part of body: Hip     Time: 4270-6237 PT Time Calculation (min) (ACUTE ONLY): 34 min  Charges:  $Gait Training: 23-37 mins                     Karma Ganja, PT, DPT  Acute Rehabilitation Department Pager #: [phone]   Bianca [name] 02/23/2020, 10:51 AM

## 2020-02-23 NOTE — Plan of Care (Signed)
  Problem: Education: Goal: Knowledge of General Education information will improve Description: Including pain rating scale, medication(s)/side effects and non-pharmacologic comfort measures Outcome: Adequate for Discharge   Problem: Health Behavior/Discharge Planning: Goal: Ability to manage health-related needs will improve Outcome: Adequate for Discharge   Problem: Clinical Measurements: Goal: Ability to maintain clinical measurements within normal limits will improve Outcome: Adequate for Discharge   Problem: Activity: Goal: Risk for activity intolerance will decrease Outcome: Adequate for Discharge   Problem: Nutrition: Goal: Adequate nutrition will be maintained Outcome: Adequate for Discharge   Problem: Coping: Goal: Level of anxiety will decrease Outcome: Adequate for Discharge   Problem: Elimination: Goal: Will not experience complications related to bowel motility Outcome: Adequate for Discharge   Problem: Pain Managment: Goal: General experience of comfort will improve Outcome: Adequate for Discharge

## 2020-02-26 DIAGNOSIS — S7291XA Unspecified fracture of right femur, initial encounter for closed fracture: Secondary | ICD-10-CM

## 2020-02-26 DIAGNOSIS — I951 Orthostatic hypotension: Secondary | ICD-10-CM | POA: Diagnosis not present

## 2020-02-26 DIAGNOSIS — F39 Unspecified mood [affective] disorder: Secondary | ICD-10-CM

## 2020-02-26 DIAGNOSIS — J439 Emphysema, unspecified: Secondary | ICD-10-CM | POA: Diagnosis not present

## 2020-02-26 DIAGNOSIS — I480 Paroxysmal atrial fibrillation: Secondary | ICD-10-CM

## 2020-02-26 DIAGNOSIS — E441 Mild protein-calorie malnutrition: Secondary | ICD-10-CM | POA: Diagnosis not present

## 2020-02-26 DIAGNOSIS — C3492 Malignant neoplasm of unspecified part of left bronchus or lung: Secondary | ICD-10-CM | POA: Diagnosis not present

## 2020-03-06 NOTE — Progress Notes (Deleted)
Cardiology Office Note Date:  03/06/2020  Patient ID:  Bianca [name], Bianca [name] 01-Apr-1941, MRN [id number] PCP:  Trinna Post, PA-C  Cardiologist:  Dr. Lovena Le  ***refresh   Chief Complaint: *** post hospital f/u  History of Present Illness: Bianca [name] is a 79 y.o. female with history of orthostatic hypotension/autonomic dysfunction on midodrine, HTN, AFib, COPD, asthma, anxiety, smoker,  She comes in today to be seen for Dr. Lovena Le, last seen by him 09/2019, she was doing well, maintaining SR on flecainide, advised to check BP prior to taking midodrine with elevated BP  In the interim has been found with a lung mass  She was admitted to [facility] 02/11/2020 after a fall (uncertain mechanism, pt did not think she fainted)and hip fracture.  She had some post anesthesia delirium, though otherwise unremarkable post op course.  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 DEspite compression strategies remained markedly orthostatic, mestinon was added to her midodrine which had to be reduced 2/2 supine HTN. AM cortisol level was normal, carotid US without obstructive disease. Orthostatic BPs improved She was discharged 02/23/2020  *** orthostatics *** meds *** labs, lipids *** eliquis, bleeding, dose *** AF burden *** compression wear?     Past Medical History:  Diagnosis Date  . Anxiety   . Arthritis   . Asthma   . COPD (chronic obstructive pulmonary disease) ([place])   . HOH (hard of hearing)   . Hypertension   . Wears dentures    top and partial bottom  . Wears glasses     Past Surgical History:  Procedure Laterality Date  . APPENDECTOMY    . BREAST LUMPECTOMY WITH NEEDLE LOCALIZATION Right 04/21/2013   Procedure: BREAST LUMPECTOMY WITH NEEDLE LOCALIZATION;  Surgeon: Harl Bowie, MD;  Location: [place];  Service:  General;  Laterality: Right;  . BREAST SURGERY  1985   cyst rt br  . MASTECTOMY W/ SENTINEL NODE BIOPSY Left 04/21/2013   Procedure: MASTECTOMY WITH SENTINEL LYMPH NODE BIOPSY;  Surgeon: Harl Bowie, MD;  Location: [place];  Service: General;  Laterality: Left;  . OTHER SURGICAL HISTORY     cysts removed from foot  . OTHER SURGICAL HISTORY     cysts removed from breast  . 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   Procedure: TOTAL HIP ARTHROPLASTY ANTERIOR APPROACH;  Surgeon: Mcarthur Rossetti, MD;  Location: [place];  Service: Orthopedics;  Laterality: Right;  . TUBAL LIGATION      Current Outpatient Medications  Medication Sig Dispense Refill  . acetaminophen (TYLENOL) 500 MG tablet Take 1,000 mg by mouth every 6 (six) hours as needed for headache (pain).    . ADVAIR DISKUS 100-50 MCG/DOSE AEPB INHALE 1 PUFF TWICE A DAY AS DIRECTED **RINSE MOUTH AFTER USE** (Patient taking differently: Inhale 1 puff into the lungs in the morning and at bedtime. Rinse mouth after use) 60 each 5  . apixaban (ELIQUIS) 2.5 MG TABS tablet Take 1 tablet (2.5 mg total) by mouth 2 (two) times daily. 60 tablet 10  . busPIRone (BUSPAR) 15 MG tablet Take 1 tablet (15 mg total) by mouth 2 (two) times daily. 4 tablet 0  . Calcium Carb-Cholecalciferol (CALCIUM+D3 PO) Take 1 tablet by mouth daily.    . Cholecalciferol (VITAMIN D3) 50 MCG (2000 UT) TABS Take 2,000 Units by mouth daily.     Marland Kitchen  ferrous gluconate (FERGON) 324 MG tablet Take 1 tablet (324 mg total) by mouth 2 (two) times daily with a meal.  3  . HYDROcodone-acetaminophen (NORCO/VICODIN) 5-325 MG tablet Take 1 tablet by mouth every 4 (four) hours as needed for moderate pain. 20 tablet 0  . lactose free nutrition (BOOST) LIQD Take 237 mLs by mouth 2 (two) times daily between meals.    . methocarbamol (ROBAXIN) 500 MG tablet Take 1 tablet (500 mg total) by mouth every 6 (six) hours as needed for muscle spasms. 5 tablet 0  . midodrine  (PROAMATINE) 2.5 MG tablet Take 1 tablet (2.5 mg total) by mouth 3 (three) times daily with meals. 90 tablet 0  . mirtazapine (REMERON) 15 MG tablet Take 1 tablet (15 mg total) by mouth at bedtime. 4 tablet 0  . PROAIR HFA 108 (90 Base) MCG/ACT inhaler INHALE 1-2 PUFFS INTO THE LUNGS EVERY 4 HOURS AS NEEDED (Patient taking differently: Inhale 1-2 puffs into the lungs every 4 (four) hours as needed for wheezing or shortness of breath. ) 8.5 g 3  . pyridostigmine (MESTINON) 60 MG tablet Take 0.5 tablets (30 mg total) by mouth 2 (two) times daily. 30 tablet 0  . senna (SENOKOT) 8.6 MG TABS tablet Take 1 tablet (8.6 mg total) by mouth 2 (two) times daily. 120 tablet 0  . 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 vitamin C (ASCORBIC ACID) 500 MG tablet Take 500 mg by mouth daily.      No current facility-administered medications for this visit.    Allergies:   Xanax [alprazolam]   Social History:  The patient  reports that she has been smoking cigarettes. She has been smoking about 0.25 packs per day. She has never used smokeless tobacco. She reports that she does not drink alcohol and does not use drugs.   Family History:  The patient's family history includes CAD in her mother; Thyroid disease in her sister.  ROS:  Please see the history of present illness.  All other systems are reviewed and otherwise negative.   PHYSICAL EXAM: *** VS:  LMP  (LMP Unknown)  BMI: There is no height or weight on file to calculate BMI. Well nourished, well developed, in no acute distress  HEENT: normocephalic, atraumatic  Neck: no JVD, carotid bruits or masses Cardiac:*** RRR; no significant murmurs, no rubs, or gallops Lungs: *** CTA b/l, no wheezing, rhonchi or rales  Abd: soft, nontender MS: no deformity or *** atrophy Ext: *** no edema  Skin: warm and dry, no rash Neuro:  No gross deficits appreciated Psych: euthymic mood, full affect   EKG:  not done today   Echocardiogram 01/22/2019: Impressions: 1. The left ventricle has normal systolic function with an ejection  fraction of 60-65%. The cavity size was normal. There is moderate  concentric left ventricular hypertrophy. Left ventricular diastolic  Doppler parameters are consistent with  pseudonormalization. Elevated left atrial and left ventricular  end-diastolic pressures.  2. The right ventricle has normal systolic function. The cavity was  normal. There is no increase in right ventricular wall thickness. Right  ventricular systolic pressure is moderately elevated.  3. Mitral valve regurgitation is moderate by color flow Doppler.  4. The aortic valve is tricuspid. Moderate thickening of the aortic  valve. Moderate calcification of the aortic valve. Aortic valve  regurgitation was not assessed by color flow Doppler.  5. The inferior vena cava was dilated in  size with <50% respiratory  variability.   Recent Labs: 01/30/2020: TSH 2.700 02/13/2020: ALT 13; Magnesium 1.9 02/22/2020: BUN 15; Creatinine, Ser 1.08; Hemoglobin 9.3; Platelets 636; Potassium 3.9; Sodium 141  10/30/2019: Chol/HDL Ratio 3.1; Cholesterol, Total 150; HDL 48; LDL Chol Calc (NIH) 86; Triglycerides 85   CrCl cannot be calculated (Unknown ideal weight.).   Wt Readings from Last 3 Encounters:  02/13/20 105 lb (47.6 kg)  01/30/20 105 lb (47.6 kg)  01/08/20 107 lb (48.5 kg)     Other studies reviewed: Additional studies/records reviewed today include: summarized above  ASSESSMENT AND PLAN:  1. Orthostatic hypotension     ***  2. HTN     ***  3. Paroxysmal Afib     CHA2DS2Vasc is 4, on *** Eliquis, appropriately dosed     ***   Disposition: F/u with ***  Current medicines are reviewed at length with the patient today.  The patient did not have any concerns regarding medicines.***  Signed, Tommye Standard, PA-C 03/06/2020 12:47 PM     [place] [id number] [phone] (office)  [phone] (fax)

## 2020-03-07 ENCOUNTER — Ambulatory Visit: Payer: PPO | Admitting: Physician Assistant

## 2020-03-17 DIAGNOSIS — H9190 Unspecified hearing loss, unspecified ear: Secondary | ICD-10-CM | POA: Diagnosis not present

## 2020-03-17 DIAGNOSIS — M80051D Age-related osteoporosis with current pathological fracture, right femur, subsequent encounter for fracture with routine healing: Secondary | ICD-10-CM | POA: Diagnosis not present

## 2020-03-17 DIAGNOSIS — C3432 Malignant neoplasm of lower lobe, left bronchus or lung: Secondary | ICD-10-CM | POA: Diagnosis not present

## 2020-03-17 DIAGNOSIS — I700 Atherosclerosis of aorta: Secondary | ICD-10-CM | POA: Diagnosis not present

## 2020-03-17 DIAGNOSIS — N183 Chronic kidney disease, stage 3 unspecified: Secondary | ICD-10-CM | POA: Diagnosis not present

## 2020-03-17 DIAGNOSIS — I951 Orthostatic hypotension: Secondary | ICD-10-CM | POA: Diagnosis not present

## 2020-03-17 DIAGNOSIS — I480 Paroxysmal atrial fibrillation: Secondary | ICD-10-CM | POA: Diagnosis not present

## 2020-03-17 DIAGNOSIS — E43 Unspecified severe protein-calorie malnutrition: Secondary | ICD-10-CM | POA: Diagnosis not present

## 2020-03-17 DIAGNOSIS — I129 Hypertensive chronic kidney disease with stage 1 through stage 4 chronic kidney disease, or unspecified chronic kidney disease: Secondary | ICD-10-CM | POA: Diagnosis not present

## 2020-03-17 DIAGNOSIS — H547 Unspecified visual loss: Secondary | ICD-10-CM | POA: Diagnosis not present

## 2020-03-17 DIAGNOSIS — M1990 Unspecified osteoarthritis, unspecified site: Secondary | ICD-10-CM | POA: Diagnosis not present

## 2020-03-17 DIAGNOSIS — J439 Emphysema, unspecified: Secondary | ICD-10-CM | POA: Diagnosis not present

## 2020-03-17 DIAGNOSIS — F419 Anxiety disorder, unspecified: Secondary | ICD-10-CM | POA: Diagnosis not present

## 2020-03-18 ENCOUNTER — Telehealth: Payer: Self-pay | Admitting: Physician Assistant

## 2020-03-18 NOTE — Telephone Encounter (Signed)
Please advise 

## 2020-03-18 NOTE — Telephone Encounter (Signed)
Verbal fine to give.

## 2020-03-18 NOTE — Telephone Encounter (Signed)
Verbal orders for nursing care -- 2w1, 1w4, 2PRN,  Please call if there are any question at [phone]

## 2020-03-19 NOTE — Telephone Encounter (Signed)
Advised 

## 2020-03-20 DIAGNOSIS — R918 Other nonspecific abnormal finding of lung field: Secondary | ICD-10-CM | POA: Diagnosis not present

## 2020-03-20 DIAGNOSIS — Z9013 Acquired absence of bilateral breasts and nipples: Secondary | ICD-10-CM | POA: Diagnosis not present

## 2020-03-20 DIAGNOSIS — M1990 Unspecified osteoarthritis, unspecified site: Secondary | ICD-10-CM | POA: Diagnosis not present

## 2020-03-20 DIAGNOSIS — K5730 Diverticulosis of large intestine without perforation or abscess without bleeding: Secondary | ICD-10-CM | POA: Diagnosis not present

## 2020-03-20 DIAGNOSIS — M80051D Age-related osteoporosis with current pathological fracture, right femur, subsequent encounter for fracture with routine healing: Secondary | ICD-10-CM | POA: Diagnosis not present

## 2020-03-20 DIAGNOSIS — I480 Paroxysmal atrial fibrillation: Secondary | ICD-10-CM | POA: Diagnosis not present

## 2020-03-20 DIAGNOSIS — F17210 Nicotine dependence, cigarettes, uncomplicated: Secondary | ICD-10-CM | POA: Diagnosis not present

## 2020-03-20 DIAGNOSIS — N183 Chronic kidney disease, stage 3 unspecified: Secondary | ICD-10-CM | POA: Diagnosis not present

## 2020-03-20 DIAGNOSIS — R634 Abnormal weight loss: Secondary | ICD-10-CM | POA: Diagnosis not present

## 2020-03-20 DIAGNOSIS — H9190 Unspecified hearing loss, unspecified ear: Secondary | ICD-10-CM | POA: Diagnosis not present

## 2020-03-20 DIAGNOSIS — E43 Unspecified severe protein-calorie malnutrition: Secondary | ICD-10-CM | POA: Diagnosis not present

## 2020-03-20 DIAGNOSIS — H547 Unspecified visual loss: Secondary | ICD-10-CM | POA: Diagnosis not present

## 2020-03-20 DIAGNOSIS — J439 Emphysema, unspecified: Secondary | ICD-10-CM | POA: Diagnosis not present

## 2020-03-20 DIAGNOSIS — F419 Anxiety disorder, unspecified: Secondary | ICD-10-CM | POA: Diagnosis not present

## 2020-03-20 DIAGNOSIS — I700 Atherosclerosis of aorta: Secondary | ICD-10-CM | POA: Diagnosis not present

## 2020-03-20 DIAGNOSIS — S72091A Other fracture of head and neck of right femur, initial encounter for closed fracture: Secondary | ICD-10-CM | POA: Diagnosis not present

## 2020-03-20 DIAGNOSIS — R05 Cough: Secondary | ICD-10-CM | POA: Diagnosis not present

## 2020-03-20 DIAGNOSIS — I951 Orthostatic hypotension: Secondary | ICD-10-CM | POA: Diagnosis not present

## 2020-03-20 DIAGNOSIS — Z96641 Presence of right artificial hip joint: Secondary | ICD-10-CM | POA: Diagnosis not present

## 2020-03-20 DIAGNOSIS — C3432 Malignant neoplasm of lower lobe, left bronchus or lung: Secondary | ICD-10-CM | POA: Diagnosis not present

## 2020-03-20 DIAGNOSIS — I2510 Atherosclerotic heart disease of native coronary artery without angina pectoris: Secondary | ICD-10-CM | POA: Diagnosis not present

## 2020-03-20 DIAGNOSIS — I129 Hypertensive chronic kidney disease with stage 1 through stage 4 chronic kidney disease, or unspecified chronic kidney disease: Secondary | ICD-10-CM | POA: Diagnosis not present

## 2020-03-20 DIAGNOSIS — C3492 Malignant neoplasm of unspecified part of left bronchus or lung: Secondary | ICD-10-CM | POA: Insufficient documentation

## 2020-03-21 ENCOUNTER — Telehealth: Payer: Self-pay | Admitting: Physician Assistant

## 2020-03-21 NOTE — Progress Notes (Addendum)
Established patient visit   Patient: Bianca [name]   DOB: 1940/12/11   79 y.o. Female  MRN: [id number] Visit Date: 03/22/2020  Today's healthcare provider: Trinna Post, PA-C   Chief Complaint  Patient presents with  . Follow-up   Subjective    HPI   Patient was last seen in this clinic late 01/2020. At that visit we had discussed imaging to evaluate patient for cancer and weight loss. We had gotten a CXR to evaluate and there was a LLL mass suspicious for primary lung cancer. She had a CT chest scheduled for further evaluation but in the interim she unfortunately broke her hip after falling on her desk. She was brought to the emergency room and a CT Chest done at the time showed a LLL cavitary lesion, again suspicious for primary lung cancer.   She underwent total right hip arthoplasty with Dr. Ninfa Linden on 02/11/2020. The surgery was uneventful. However, her post operative course was complicated by orthostatic hypotension and significant supine hypertension. She was ultimately discharged to [facility] for three weeks of rehab. She has since been home living with one of her daughters.   She has not had orthopedic follow up regarding her right hip incision. She has the same dressing since her surgery. Staples have not been removed.   Meanwhile, she has also been seen by [facility] oncology and has been approved for thoracoscopy and biopsy of LLL tentatively on 04/11/2020.        Medications: Outpatient Medications Prior to Visit  Medication Sig  . lactose free nutrition (BOOST) LIQD Take 237 mLs by mouth 2 (two) times daily between meals.  . midodrine (PROAMATINE) 2.5 MG tablet Take 1 tablet (2.5 mg total) by mouth 3 (three) times daily with meals.  . mirtazapine (REMERON) 15 MG tablet Take 1 tablet (15 mg total) by mouth at bedtime.  Marland Kitchen PROAIR HFA 108 (90 Base) MCG/ACT inhaler INHALE 1-2 PUFFS INTO THE LUNGS EVERY 4 HOURS AS NEEDED (Patient taking differently: Inhale 1-2 puffs into  the lungs every 4 (four) hours as needed for wheezing or shortness of breath. )  . pyridostigmine (MESTINON) 60 MG tablet Take 0.5 tablets (30 mg total) by mouth 2 (two) times daily.  Marland Kitchen UNABLE TO FIND Rx: [id number]- Post Surgical Bras (Quantity: 6)\ Dx: 174.9; Left mastectomy  . vitamin C (ASCORBIC ACID) 500 MG tablet Take 500 mg by mouth daily.   Marland Kitchen acetaminophen (TYLENOL) 500 MG tablet Take 1,000 mg by mouth every 6 (six) hours as needed for headache (pain).  Marland Kitchen apixaban (ELIQUIS) 2.5 MG TABS tablet Take 1 tablet (2.5 mg total) by mouth 2 (two) times daily.  . busPIRone (BUSPAR) 15 MG tablet Take 1 tablet (15 mg total) by mouth 2 (two) times daily.  . Calcium Carb-Cholecalciferol (CALCIUM+D3 PO) Take 1 tablet by mouth daily.  . Cholecalciferol (VITAMIN D3) 50 MCG (2000 UT) TABS Take 2,000 Units by mouth daily.   . ferrous gluconate (FERGON) 324 MG tablet Take 1 tablet (324 mg total) by mouth 2 (two) times daily with a meal.  . [DISCONTINUED] ADVAIR DISKUS 100-50 MCG/DOSE AEPB INHALE 1 PUFF TWICE A DAY AS DIRECTED **RINSE MOUTH AFTER USE** (Patient taking differently: Inhale 1 puff into the lungs in the morning and at bedtime. Rinse mouth after use)  . [DISCONTINUED] HYDROcodone-acetaminophen (NORCO/VICODIN) 5-325 MG tablet Take 1 tablet by mouth every 4 (four) hours as needed for moderate pain. (Patient not taking: Reported on 03/22/2020)  . [DISCONTINUED] methocarbamol (  ROBAXIN) 500 MG tablet Take 1 tablet (500 mg total) by mouth every 6 (six) hours as needed for muscle spasms. (Patient not taking: Reported on 03/22/2020)  . [DISCONTINUED] senna (SENOKOT) 8.6 MG TABS tablet Take 1 tablet (8.6 mg total) by mouth 2 (two) times daily. (Patient not taking: Reported on 03/22/2020)   No facility-administered medications prior to visit.    Review of Systems    Objective    BP (!) 141/77   Pulse (!) 104   Temp 97.7 F (36.5 C)   Wt 108 lb (49 kg)   LMP  (LMP Unknown)   BMI 17.97 kg/m    Physical  Exam Constitutional:      Appearance: Normal appearance.  Cardiovascular:     Rate and Rhythm: Normal rate and regular rhythm.     Heart sounds: Normal heart sounds.  Pulmonary:     Effort: Pulmonary effort is normal.     Breath sounds: Wheezing and rhonchi present.  Skin:      Neurological:     Mental Status: She is alert and oriented to person, place, and time. Mental status is at baseline.  Psychiatric:        Mood and Affect: Mood normal.        Behavior: Behavior normal.     Media Information   Document Information  Photos  Right hip incision   03/22/2020 10:56  Attached To:  Office Visit on 03/22/20 with Trinna Post, PA-C  Source Information  Paulene Floor  [place]  Right hip incision after staple removal   03/22/2020 11:01  Attached To:  Office Visit on 03/22/20 with Trinna Post, PA-C  Source Information  Trinna Post, PA-C  [facility]     No results found for any visits on 03/22/20.  Assessment & Plan    1. Cavitating mass in left lower lung lobe  Highly suspicious for primary lung cancer. Surgery scheduled with [facility] tentatively on 04/11/2020.  - CBC with Differential - Comprehensive Metabolic Panel (CMET) - Fe+TIBC+Fer  2. Tobacco abuse  - CBC with Differential - Comprehensive Metabolic Panel (CMET) - Fe+TIBC+Fer  3. Anemia, unspecified type  - CBC with Differential - Comprehensive Metabolic Panel (CMET) - Fe+TIBC+Fer  4. Encounter for staple removal  14 staples removed without incident. Some small areas in incision where wound is not completely healed but are well approximated. Patient has poor nutrition status and I anticipate she will continue to heal slowly.   5. History of total right hip arthroplasty  Follow up with Dr. Ninfa Linden on 03/26/2020. I will forward my notes for his knowledge.    6. COPD  Refilled advair.   Return  if symptoms worsen or fail to improve.     ITrinna Post, PA-C, have reviewed all documentation for this visit. The documentation on 03/22/20 for the exam, diagnosis, procedures, and orders are all accurate and complete.   The entirety of the information documented in the History of Present Illness, Review of Systems and Physical Exam were personally obtained by me. Portions of this information were initially documented by [facility] and reviewed by me for thoroughness and accuracy.   I spent 40 minutes dedicated to the care of this patient on the date of this encounter to include pre-visit review of records, face-to-face time with the patient discussing lung cancer, right hip arthroplasty and post visit ordering of testing.  Paulene Floor  [facility] [phone] (phone) [phone] (fax)  [place]

## 2020-03-21 NOTE — Telephone Encounter (Signed)
Jobe Gibbon PT with [facility] verbal approval for PT for 1 week 1 , 2 week 5, 1 week 3 Ok to leave vebal on VM Cb- [phone]

## 2020-03-22 ENCOUNTER — Encounter: Payer: Self-pay | Admitting: Physician Assistant

## 2020-03-22 ENCOUNTER — Telehealth: Payer: Self-pay

## 2020-03-22 ENCOUNTER — Ambulatory Visit (INDEPENDENT_AMBULATORY_CARE_PROVIDER_SITE_OTHER): Payer: PPO | Admitting: Physician Assistant

## 2020-03-22 ENCOUNTER — Other Ambulatory Visit: Payer: Self-pay

## 2020-03-22 ENCOUNTER — Telehealth: Payer: Self-pay | Admitting: Orthopaedic Surgery

## 2020-03-22 VITALS — BP 141/77 | HR 104 | Temp 97.7°F | Wt 108.0 lb

## 2020-03-22 DIAGNOSIS — D649 Anemia, unspecified: Secondary | ICD-10-CM

## 2020-03-22 DIAGNOSIS — J40 Bronchitis, not specified as acute or chronic: Secondary | ICD-10-CM

## 2020-03-22 DIAGNOSIS — J438 Other emphysema: Secondary | ICD-10-CM | POA: Diagnosis not present

## 2020-03-22 DIAGNOSIS — Z720 Tobacco use: Secondary | ICD-10-CM | POA: Diagnosis not present

## 2020-03-22 DIAGNOSIS — J984 Other disorders of lung: Secondary | ICD-10-CM | POA: Diagnosis not present

## 2020-03-22 DIAGNOSIS — Z4802 Encounter for removal of sutures: Secondary | ICD-10-CM

## 2020-03-22 DIAGNOSIS — Z96641 Presence of right artificial hip joint: Secondary | ICD-10-CM | POA: Diagnosis not present

## 2020-03-22 MED ORDER — ADVAIR DISKUS 100-50 MCG/DOSE IN AEPB: INHALATION_SPRAY | RESPIRATORY_TRACT | 5 refills | Status: DC

## 2020-03-22 NOTE — Telephone Encounter (Signed)
OK to give verbal.

## 2020-03-22 NOTE — Telephone Encounter (Signed)
[place] w/ [facility] to move forwards with PT orders per [facility].

## 2020-03-22 NOTE — Telephone Encounter (Signed)
Carles Collet, PA called concerning patient still having staples in from her surgery.  Stated that incision looked well and wanted to know if she could remove the staples.  Advised that if she felt comfortable with removing staples and if incision looked good to her then, it would be her decision.  Stated that she would remove what she could, due to some staples being embedded.   Patient has appt.on Tuesday, 03/26/2020 with Dr. Ninfa Linden

## 2020-03-22 NOTE — Telephone Encounter (Signed)
Received call from [place] case manage with [employer] advised patient had surgery 02/11/2020. Patient went to rehab and was never scheduled for her post op visit. She advised patient is seeing her PCP today today to get the bandages removed. Patient is scheduled to see Dr. Ninfa Linden 03/26/2020.

## 2020-03-22 NOTE — Telephone Encounter (Signed)
Holding for you. I spoke with Joycelyn Schmid in regards to appt being scheduled for Tuesday. Appt already made with PCP today. If PCP feels that patient needs to be seen prior to Tuesday in our office, we will work in appt with Lurena Joiner for this afternoon.

## 2020-03-23 LAB — COMPREHENSIVE METABOLIC PANEL
ALT: 8 IU/L (ref 0–32)
AST: 13 IU/L (ref 0–40)
Albumin/Globulin Ratio: 1.6 (ref 1.2–2.2)
Albumin: 3.9 g/dL (ref 3.6–4.6)
Alkaline Phosphatase: 97 IU/L (ref 48–121)
BUN/Creatinine Ratio: 22 (ref 12–28)
BUN: 23 mg/dL (ref 8–27)
Bilirubin Total: 0.3 mg/dL (ref 0.0–1.2)
CO2: 23 mmol/L (ref 20–29)
Calcium: 9.5 mg/dL (ref 8.7–10.3)
Chloride: 105 mmol/L (ref 96–106)
Creatinine, Ser: 1.04 mg/dL — ABNORMAL HIGH (ref 0.57–1.00)
GFR calc Af Amer: 58 mL/min/{1.73_m2} — ABNORMAL LOW (ref >59)
GFR calc non Af Amer: 51 mL/min/{1.73_m2} — ABNORMAL LOW (ref >59)
Globulin, Total: 2.5 g/dL (ref 1.5–4.5)
Glucose: 90 mg/dL (ref 65–99)
Potassium: 4.3 mmol/L (ref 3.5–5.2)
Sodium: 141 mmol/L (ref 134–144)
Total Protein: 6.4 g/dL (ref 6.0–8.5)

## 2020-03-23 LAB — CBC WITH DIFFERENTIAL/PLATELET
Basophils Absolute: 0.1 10*3/uL (ref 0.0–0.2)
Basos: 1 %
EOS (ABSOLUTE): 0.3 10*3/uL (ref 0.0–0.4)
Eos: 3 %
Hematocrit: 34.3 % (ref 34.0–46.6)
Hemoglobin: 10.5 g/dL — ABNORMAL LOW (ref 11.1–15.9)
Immature Grans (Abs): 0 10*3/uL (ref 0.0–0.1)
Immature Granulocytes: 0 %
Lymphocytes Absolute: 2.2 10*3/uL (ref 0.7–3.1)
Lymphs: 29 %
MCH: 25.5 pg — ABNORMAL LOW (ref 26.6–33.0)
MCHC: 30.6 g/dL — ABNORMAL LOW (ref 31.5–35.7)
MCV: 84 fL (ref 79–97)
Monocytes Absolute: 0.7 10*3/uL (ref 0.1–0.9)
Monocytes: 9 %
Neutrophils Absolute: 4.5 10*3/uL (ref 1.4–7.0)
Neutrophils: 58 %
Platelets: 493 10*3/uL — ABNORMAL HIGH (ref 150–450)
RBC: 4.11 x10E6/uL (ref 3.77–5.28)
RDW: 15.1 % (ref 11.7–15.4)
WBC: 7.7 10*3/uL (ref 3.4–10.8)

## 2020-03-23 LAB — IRON,TIBC AND FERRITIN PANEL
Ferritin: 71 ng/mL (ref 15–150)
Iron Saturation: 12 % — ABNORMAL LOW (ref 15–55)
Iron: 32 ug/dL (ref 27–139)
Total Iron Binding Capacity: 275 ug/dL (ref 250–450)
UIBC: 243 ug/dL (ref 118–369)

## 2020-03-26 ENCOUNTER — Encounter: Payer: Self-pay | Admitting: Orthopaedic Surgery

## 2020-03-26 ENCOUNTER — Ambulatory Visit (INDEPENDENT_AMBULATORY_CARE_PROVIDER_SITE_OTHER): Payer: PPO | Admitting: Orthopaedic Surgery

## 2020-03-26 DIAGNOSIS — Z8781 Personal history of (healed) traumatic fracture: Secondary | ICD-10-CM | POA: Insufficient documentation

## 2020-03-26 DIAGNOSIS — Z96641 Presence of right artificial hip joint: Secondary | ICD-10-CM

## 2020-03-26 NOTE — Progress Notes (Signed)
The patient is 6 weeks status post a right total hip arthroplasty to treat a displaced right hip femoral neck fracture.  She does ambulate with a rolling walker.  She reports that she is doing well overall and reports increased mobility and increased strength.  She is dealing with orthostatic hypotension so she has to be careful with her mobility.  On examination, her right hip moves smoothly and fluidly.  Her leg lengths are equal.  Her incision is healed nicely.  At this standpoint I will need to see her back for 3 months unless she is having any issues.  At that visit I would like a standing low AP pelvis and a lateral of her right operative hip.  All questions and concerns were answered and addressed.

## 2020-03-27 DIAGNOSIS — R918 Other nonspecific abnormal finding of lung field: Secondary | ICD-10-CM | POA: Diagnosis not present

## 2020-03-29 ENCOUNTER — Other Ambulatory Visit: Payer: Self-pay

## 2020-03-29 ENCOUNTER — Ambulatory Visit: Payer: PPO | Admitting: Physician Assistant

## 2020-03-29 VITALS — BP 140/76 | HR 94 | Ht 65.0 in | Wt 109.0 lb

## 2020-03-29 DIAGNOSIS — I951 Orthostatic hypotension: Secondary | ICD-10-CM | POA: Diagnosis not present

## 2020-03-29 DIAGNOSIS — I10 Essential (primary) hypertension: Secondary | ICD-10-CM | POA: Diagnosis not present

## 2020-03-29 DIAGNOSIS — I480 Paroxysmal atrial fibrillation: Secondary | ICD-10-CM

## 2020-03-29 NOTE — Progress Notes (Signed)
Cardiology Office Note Date:  03/29/2020  Patient ID:  Bianca [name], Bianca [name] 1941-05-21, MRN [id number] PCP:  Trinna Post, PA-C  Cardiologist:  Dr. Lovena Le    Chief Complaint: post hospital f/u  History of Present Illness: Bianca [name] is a 79 y.o. female with history of orthostatic hypotension/autonomic dysfunction on midodrine, HTN, AFib, COPD, asthma, anxiety, smoker.  She comes in today to be seen for Dr. Lovena Le, last seen by him 09/2019, she was doing well, maintaining SR on flecainide, advised to check BP prior to taking midodrine with elevated BP  In the interim has been found with a lung mass  She was admitted to [facility] 02/11/2020 after a fall (uncertain mechanism, pt did not think she fainted)and hip fracture.  She had some post anesthesia delirium, though otherwise unremarkable post op course.  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 Despite compression strategies remained markedly orthostatic, mestinon was added to her midodrine which had to be reduced 2/2 supine HTN. AM cortisol level was normal, carotid US without obstructive disease. Orthostatic BPs improved She was discharged 02/23/2020  TODAY She is accompanied by her daughter. She reports her fall as possibly syncope, she had been seated on the porch and had gotten up to go inside, her daughter ahead of her to keep the dogs inside, and she fell of the side of he deck, not terribly high only a foot or so off the ground.  She does not recall "waking up" necessarily, but assumes she fainted.  She has done quite well since her discharge, is tolerating PT well, and her daughter mentions she has even stood for several minutes without getting dizzy. She does not have any cardiac awareness, no palpitations or CP. She does not wear the support stocking or abdominal binder, even though her  daughter notes they helped her quite a bit. She has not had near syncope or syncope since home on the current regime.  She has not smoked since her discharge.  She is pending thoracoscopy and biopsy at [facility], scheduled for 04/11/20. They have instructed her to stop her Eliquis on the 18th.  She denies any bleeding or signs of bleeding    Past Medical History:  Diagnosis Date  . Anxiety   . Arthritis   . Asthma   . COPD (chronic obstructive pulmonary disease) ([place])   . HOH (hard of hearing)   . Hypertension   . Wears dentures    top and partial bottom  . Wears glasses     Past Surgical History:  Procedure Laterality Date  . APPENDECTOMY    . BREAST LUMPECTOMY WITH NEEDLE LOCALIZATION Right 04/21/2013   Procedure: BREAST LUMPECTOMY WITH NEEDLE LOCALIZATION;  Surgeon: Harl Bowie, MD;  Location: [facility];  Service: General;  Laterality: Right;  . BREAST SURGERY  1985   cyst rt br  . MASTECTOMY W/ SENTINEL NODE BIOPSY Left 04/21/2013   Procedure: MASTECTOMY WITH SENTINEL LYMPH NODE BIOPSY;  Surgeon: Harl Bowie, MD;  Location: [facility];  Service: General;  Laterality: Left;  . OTHER SURGICAL HISTORY     cysts removed from foot  . OTHER SURGICAL HISTORY     cysts removed from breast  . 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   Procedure: TOTAL HIP ARTHROPLASTY ANTERIOR APPROACH;  Surgeon: Mcarthur Rossetti, MD;  Location: [facility]  OR;  Service: Orthopedics;  Laterality: Right;  . TUBAL LIGATION      Current Outpatient Medications  Medication Sig Dispense Refill  . acetaminophen (TYLENOL) 500 MG tablet Take 1,000 mg by mouth every 6 (six) hours as needed for headache (pain).    . ADVAIR DISKUS 100-50 MCG/DOSE AEPB INHALE 1 PUFF TWICE A DAY AS DIRECTED **RINSE MOUTH AFTER USE** 60 each 5  . apixaban (ELIQUIS) 2.5 MG TABS tablet Take 1 tablet (2.5 mg total) by mouth 2 (two) times daily. 60 tablet 10  . busPIRone (BUSPAR) 15 MG tablet  Take 1 tablet (15 mg total) by mouth 2 (two) times daily. (Patient taking differently: Take 15 mg by mouth daily. ) 4 tablet 0  . Calcium Carb-Cholecalciferol (CALCIUM+D3 PO) Take 1 tablet by mouth daily.    . Cholecalciferol (VITAMIN D3) 50 MCG (2000 UT) TABS Take 2,000 Units by mouth daily.     . ferrous gluconate (FERGON) 324 MG tablet Take 1 tablet (324 mg total) by mouth 2 (two) times daily with a meal.  3  . lactose free nutrition (BOOST) LIQD Take 237 mLs by mouth 2 (two) times daily between meals.    . midodrine (PROAMATINE) 2.5 MG tablet Take 1 tablet (2.5 mg total) by mouth 3 (three) times daily with meals. 90 tablet 0  . mirtazapine (REMERON) 15 MG tablet Take 1 tablet (15 mg total) by mouth at bedtime. 4 tablet 0  . PROAIR HFA 108 (90 Base) MCG/ACT inhaler INHALE 1-2 PUFFS INTO THE LUNGS EVERY 4 HOURS AS NEEDED (Patient taking differently: Inhale 1-2 puffs into the lungs every 4 (four) hours as needed for wheezing or shortness of breath. ) 8.5 g 3  . 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 vitamin C (ASCORBIC ACID) 500 MG tablet Take 500 mg by mouth daily.     Marland Kitchen pyridostigmine (MESTINON) 60 MG tablet Take 0.5 tablets (30 mg total) by mouth 2 (two) times daily. 30 tablet 0   No current facility-administered medications for this visit.    Allergies:   Xanax [alprazolam]   Social History:  The patient  reports that she has been smoking cigarettes. She has been smoking about 0.25 packs per day. She has never used smokeless tobacco. She reports that she does not drink alcohol and does not use drugs.   Family History:  The patient's family history includes CAD in her mother; Thyroid disease in her sister.  ROS:  Please see the history of present illness.  All other systems are reviewed and otherwise negative.   PHYSICAL EXAM:  VS:  BP 140/76   Pulse 94   Ht 5\' 5"  (1.651 m)   Wt 109 lb (49.4 kg)   LMP  (LMP Unknown)   BMI  18.14 kg/m BMI: Body mass index is 18.14 kg/m. Very thin elderly female, in no acute distress  HEENT: normocephalic, atraumatic  Neck: no JVD, carotid bruits or masses Cardiac:RRR; no significant murmurs, no rubs, or gallops Lungs: CTA b/l, no wheezing, rhonchi or rales  Abd: soft, nontender MS: no deformity, advanced atrophy even for her age Ext: trace pedal edema  Skin: warm and dry, no rash Neuro:  No gross deficits appreciated Psych: euthymic mood, full affect   EKG: not done today 02/11/2020: SR, baseline artifact, 99pm   Echocardiogram 01/22/2019: Impressions: 1. The left ventricle has normal systolic function with an ejection  fraction of 60-65%. The cavity size  was normal. There is moderate  concentric left ventricular hypertrophy. Left ventricular diastolic  Doppler parameters are consistent with  pseudonormalization. Elevated left atrial and left ventricular  end-diastolic pressures.  2. The right ventricle has normal systolic function. The cavity was  normal. There is no increase in right ventricular wall thickness. Right  ventricular systolic pressure is moderately elevated.  3. Mitral valve regurgitation is moderate by color flow Doppler.  4. The aortic valve is tricuspid. Moderate thickening of the aortic  valve. Moderate calcification of the aortic valve. Aortic valve  regurgitation was not assessed by color flow Doppler.  5. The inferior vena cava was dilated in size with <50% respiratory  variability.   Recent Labs: 01/30/2020: TSH 2.700 02/13/2020: Magnesium 1.9 03/22/2020: ALT 8; BUN 23; Creatinine, Ser 1.04; Hemoglobin 10.5; Platelets 493; Potassium 4.3; Sodium 141  10/30/2019: Chol/HDL Ratio 3.1; Cholesterol, Total 150; HDL 48; LDL Chol Calc (NIH) 86; Triglycerides 85   Estimated Creatinine Clearance: 33.1 mL/min (A) (by C-G formula based on SCr of 1.04 mg/dL (H)).   Wt Readings from Last 3 Encounters:  03/29/20 109 lb (49.4 kg)  03/22/20 108 lb (49  kg)  02/13/20 105 lb (47.6 kg)     Other studies reviewed: Additional studies/records reviewed today include: summarized above  ASSESSMENT AND PLAN:  1. Orthostatic hypotension     Has done well since home     Compressive wear has helped her though she does not wear it and is encouraged to do so when up and around.     Discussed safety, symptom recognition, adequate hydration  2. HTN     Given her orthostatic hypotension will allow her resting BP up some, today's looks good  3. Paroxysmal Afib     CHA2DS2Vasc is 4, on Eliquis, appropriately dosed for age and weight     No symptoms of AFib   Dr. Lovena Le said hello today to touch base, also encouraged addition of sodium to her diet and    Disposition: They would like to touch base with Dr. Lovena Le post op, will plan for 2 mo, sooner if needed   Current medicines are reviewed at length with the patient today.  The patient did not have any concerns regarding medicines.  Venetia Night, PA-C 03/29/2020 1:06 PM     [facility] [address] [place] [address] [phone] (office)  [phone] (fax)

## 2020-03-29 NOTE — Patient Instructions (Signed)
Medication Instructions:   Your physician recommends that you continue on your current medications as directed. Please refer to the Current Medication list given to you today.   *If you need a refill on your cardiac medications before your next appointment, please call your pharmacy*   Lab Work: [place]   If you have labs (blood work) drawn today and your tests are completely normal, you will receive your results only by: Marland Kitchen MyChart Message (if you have MyChart) OR . A paper copy in the mail If you have any lab test that is abnormal or we need to change your treatment, we will call you to review the results.   Testing/Procedures: NONE ORDERED  TODAY   Follow-Up: At [facility], you and your health needs are our priority.  As part of our continuing mission to provide you with exceptional heart care, we have created designated Provider Care Teams.  These Care Teams include your primary Cardiologist (physician) and Advanced Practice Providers (APPs -  Physician Assistants and Nurse Practitioners) who all work together to provide you with the care you need, when you need it.  We recommend signing up for the patient portal called "MyChart".  Sign up information is provided on this After Visit Summary.  MyChart is used to connect with patients for Virtual Visits (Telemedicine).  Patients are able to view lab/test results, encounter notes, upcoming appointments, etc.  Non-urgent messages can be sent to your provider as well.   To learn more about what you can do with MyChart, go to [email or web address].    Your next appointment:   2 month(s)  The format for your next appointment:   In Person  Provider:   You may see Dr Lovena Le  or one of the following Advanced Practice Providers on your designated Care Team:    Chanetta Marshall, NP  Tommye Standard, PA-C  Legrand Como "Oda Kilts, [place]    Other Instructions

## 2020-04-10 DIAGNOSIS — Z853 Personal history of malignant neoplasm of breast: Secondary | ICD-10-CM | POA: Diagnosis not present

## 2020-04-10 DIAGNOSIS — I358 Other nonrheumatic aortic valve disorders: Secondary | ICD-10-CM | POA: Diagnosis not present

## 2020-04-10 DIAGNOSIS — Z87891 Personal history of nicotine dependence: Secondary | ICD-10-CM | POA: Diagnosis not present

## 2020-04-10 DIAGNOSIS — T402X5A Adverse effect of other opioids, initial encounter: Secondary | ICD-10-CM | POA: Diagnosis not present

## 2020-04-10 DIAGNOSIS — N183 Chronic kidney disease, stage 3 unspecified: Secondary | ICD-10-CM | POA: Diagnosis not present

## 2020-04-10 DIAGNOSIS — J449 Chronic obstructive pulmonary disease, unspecified: Secondary | ICD-10-CM | POA: Diagnosis not present

## 2020-04-10 DIAGNOSIS — R634 Abnormal weight loss: Secondary | ICD-10-CM | POA: Diagnosis not present

## 2020-04-10 DIAGNOSIS — Z9181 History of falling: Secondary | ICD-10-CM | POA: Diagnosis not present

## 2020-04-10 DIAGNOSIS — Z9012 Acquired absence of left breast and nipple: Secondary | ICD-10-CM | POA: Diagnosis not present

## 2020-04-10 DIAGNOSIS — R911 Solitary pulmonary nodule: Secondary | ICD-10-CM | POA: Diagnosis not present

## 2020-04-10 DIAGNOSIS — F329 Major depressive disorder, single episode, unspecified: Secondary | ICD-10-CM | POA: Diagnosis not present

## 2020-04-10 DIAGNOSIS — R918 Other nonspecific abnormal finding of lung field: Secondary | ICD-10-CM | POA: Diagnosis not present

## 2020-04-10 DIAGNOSIS — Z96641 Presence of right artificial hip joint: Secondary | ICD-10-CM | POA: Diagnosis not present

## 2020-04-10 DIAGNOSIS — J939 Pneumothorax, unspecified: Secondary | ICD-10-CM | POA: Diagnosis not present

## 2020-04-10 DIAGNOSIS — C3432 Malignant neoplasm of lower lobe, left bronchus or lung: Secondary | ICD-10-CM | POA: Diagnosis not present

## 2020-04-10 DIAGNOSIS — F17200 Nicotine dependence, unspecified, uncomplicated: Secondary | ICD-10-CM | POA: Diagnosis not present

## 2020-04-10 DIAGNOSIS — Z7951 Long term (current) use of inhaled steroids: Secondary | ICD-10-CM | POA: Diagnosis not present

## 2020-04-10 DIAGNOSIS — Z79899 Other long term (current) drug therapy: Secondary | ICD-10-CM | POA: Diagnosis not present

## 2020-04-10 DIAGNOSIS — Z7901 Long term (current) use of anticoagulants: Secondary | ICD-10-CM | POA: Diagnosis not present

## 2020-04-10 DIAGNOSIS — I951 Orthostatic hypotension: Secondary | ICD-10-CM | POA: Insufficient documentation

## 2020-04-10 DIAGNOSIS — N1831 Chronic kidney disease, stage 3a: Secondary | ICD-10-CM | POA: Diagnosis not present

## 2020-04-10 DIAGNOSIS — M810 Age-related osteoporosis without current pathological fracture: Secondary | ICD-10-CM | POA: Insufficient documentation

## 2020-04-10 DIAGNOSIS — Z8781 Personal history of (healed) traumatic fracture: Secondary | ICD-10-CM | POA: Insufficient documentation

## 2020-04-10 DIAGNOSIS — I129 Hypertensive chronic kidney disease with stage 1 through stage 4 chronic kidney disease, or unspecified chronic kidney disease: Secondary | ICD-10-CM | POA: Diagnosis not present

## 2020-04-10 DIAGNOSIS — R410 Disorientation, unspecified: Secondary | ICD-10-CM | POA: Diagnosis not present

## 2020-04-10 DIAGNOSIS — R400 Somnolence: Secondary | ICD-10-CM | POA: Diagnosis not present

## 2020-04-10 DIAGNOSIS — I480 Paroxysmal atrial fibrillation: Secondary | ICD-10-CM | POA: Insufficient documentation

## 2020-04-10 DIAGNOSIS — T797XXA Traumatic subcutaneous emphysema, initial encounter: Secondary | ICD-10-CM | POA: Diagnosis not present

## 2020-04-10 DIAGNOSIS — I6782 Cerebral ischemia: Secondary | ICD-10-CM | POA: Diagnosis not present

## 2020-04-10 DIAGNOSIS — J42 Unspecified chronic bronchitis: Secondary | ICD-10-CM | POA: Diagnosis not present

## 2020-04-10 DIAGNOSIS — Z20822 Contact with and (suspected) exposure to covid-19: Secondary | ICD-10-CM | POA: Diagnosis not present

## 2020-04-10 DIAGNOSIS — F419 Anxiety disorder, unspecified: Secondary | ICD-10-CM | POA: Diagnosis not present

## 2020-04-10 DIAGNOSIS — Z23 Encounter for immunization: Secondary | ICD-10-CM | POA: Diagnosis not present

## 2020-04-10 DIAGNOSIS — M1990 Unspecified osteoarthritis, unspecified site: Secondary | ICD-10-CM | POA: Diagnosis not present

## 2020-04-10 DIAGNOSIS — D509 Iron deficiency anemia, unspecified: Secondary | ICD-10-CM | POA: Diagnosis not present

## 2020-04-10 DIAGNOSIS — Z9013 Acquired absence of bilateral breasts and nipples: Secondary | ICD-10-CM | POA: Diagnosis not present

## 2020-04-10 DIAGNOSIS — Z01818 Encounter for other preprocedural examination: Secondary | ICD-10-CM | POA: Diagnosis not present

## 2020-04-10 DIAGNOSIS — Z4682 Encounter for fitting and adjustment of non-vascular catheter: Secondary | ICD-10-CM | POA: Diagnosis not present

## 2020-04-10 DIAGNOSIS — J984 Other disorders of lung: Secondary | ICD-10-CM | POA: Diagnosis not present

## 2020-04-10 DIAGNOSIS — J90 Pleural effusion, not elsewhere classified: Secondary | ICD-10-CM | POA: Diagnosis not present

## 2020-04-10 DIAGNOSIS — G8918 Other acute postprocedural pain: Secondary | ICD-10-CM | POA: Diagnosis not present

## 2020-04-10 DIAGNOSIS — Z5332 Thoracoscopic surgical procedure converted to open procedure: Secondary | ICD-10-CM | POA: Diagnosis not present

## 2020-04-11 DIAGNOSIS — J939 Pneumothorax, unspecified: Secondary | ICD-10-CM | POA: Diagnosis not present

## 2020-04-11 DIAGNOSIS — R918 Other nonspecific abnormal finding of lung field: Secondary | ICD-10-CM | POA: Diagnosis not present

## 2020-04-11 DIAGNOSIS — J984 Other disorders of lung: Secondary | ICD-10-CM | POA: Insufficient documentation

## 2020-04-11 DIAGNOSIS — Z4682 Encounter for fitting and adjustment of non-vascular catheter: Secondary | ICD-10-CM | POA: Diagnosis not present

## 2020-04-12 DIAGNOSIS — J90 Pleural effusion, not elsewhere classified: Secondary | ICD-10-CM | POA: Diagnosis not present

## 2020-04-12 DIAGNOSIS — R918 Other nonspecific abnormal finding of lung field: Secondary | ICD-10-CM | POA: Diagnosis not present

## 2020-04-12 DIAGNOSIS — G8918 Other acute postprocedural pain: Secondary | ICD-10-CM | POA: Diagnosis not present

## 2020-04-13 DIAGNOSIS — T797XXA Traumatic subcutaneous emphysema, initial encounter: Secondary | ICD-10-CM | POA: Diagnosis not present

## 2020-04-13 DIAGNOSIS — G8918 Other acute postprocedural pain: Secondary | ICD-10-CM | POA: Diagnosis not present

## 2020-04-13 DIAGNOSIS — Z4682 Encounter for fitting and adjustment of non-vascular catheter: Secondary | ICD-10-CM | POA: Diagnosis not present

## 2020-04-13 DIAGNOSIS — J90 Pleural effusion, not elsewhere classified: Secondary | ICD-10-CM | POA: Diagnosis not present

## 2020-04-14 DIAGNOSIS — J984 Other disorders of lung: Secondary | ICD-10-CM | POA: Diagnosis not present

## 2020-04-14 DIAGNOSIS — R918 Other nonspecific abnormal finding of lung field: Secondary | ICD-10-CM | POA: Diagnosis not present

## 2020-04-14 DIAGNOSIS — G8918 Other acute postprocedural pain: Secondary | ICD-10-CM | POA: Diagnosis not present

## 2020-04-15 ENCOUNTER — Telehealth: Payer: Self-pay | Admitting: Internal Medicine

## 2020-04-15 NOTE — Telephone Encounter (Signed)
Patient's daughter called stating her mother is currently at [facility] because she had to have lung cancer surgery done.  She is having a hard time having the doctor's a [facility] listen to her about her mother's OH, she states she told them to contact our office about it. She is wondering if there is anything we can do about this, so her mother will be able to be discharge him.

## 2020-04-15 NOTE — Telephone Encounter (Signed)
Spoke with pt's daughter,Bianca [name], DPR who is requesting Dr Lovena Le contact Dr Wynelle Cleveland, [place] re: pt's blood pressure issues.  Daughter states [facility] has take pt off Midodrine and Mestinon and are unable to discharge her because she is unable to walk d/t low B/P.  Pt's daughter states she has tried to explain pt's complicated history but does not feel she is being heard.  Requesting Dr Lovena Le to contact oncologist to assist with B/P.  Pt's daughter states she has asked [facility] to contact Dr Lovena Le but to her knowledge this has not been done.  Pt's daughter advised Dr Lovena Le is working in the hospital today and his RN is not in the office. Requested daughter obtain Dr Idelle Crouch contact number for Dr Lovena Le and will forward request to Dr Lovena Le and his RN for consideration and assistance.  Pt's daughter verbalizes understanding and agrees with current plan.

## 2020-04-16 NOTE — Telephone Encounter (Signed)
Patient's daughter is calling to follow up. She is requesting to have Dr. Tanna Furry nurse call the covering doctor at [facility], Dr. Darci Current (per patient's daughter, spelling may be incorrect). She would like for our office to assist with explaining the patient's medical history. Call may be returning to Dr. Gretchen Short at [phone]. Patient's daughter would also like a clal back.

## 2020-04-16 NOTE — Telephone Encounter (Signed)
Call back received from [place].  Advised she could increase midodrine to 5 mg TID (per Dr. Lovena Le)  Also advised per Dr. Lovena Le to allow Pt's systolic blood pressure to remain high so that Pt is able to stand up and walk.

## 2020-04-16 NOTE — Telephone Encounter (Signed)
Spoke with daughter.  Pt having continued issues with orthostatic hypotension.  Seems to be multifactorial-Pt is not being assisted to a sitting position prior to PT visits.   Pt was given losartan for a systolic blood pressure of 165.  Per daughter-Dr. Margreta Journey would like to discuss continued care with Dr. Lovena Le.  Left message for Dr. Margreta Journey to call back this nurse directly.

## 2020-04-19 ENCOUNTER — Telehealth: Payer: Self-pay | Admitting: Physician Assistant

## 2020-04-19 DIAGNOSIS — J439 Emphysema, unspecified: Secondary | ICD-10-CM | POA: Diagnosis not present

## 2020-04-19 DIAGNOSIS — H547 Unspecified visual loss: Secondary | ICD-10-CM | POA: Diagnosis not present

## 2020-04-19 DIAGNOSIS — M1990 Unspecified osteoarthritis, unspecified site: Secondary | ICD-10-CM | POA: Diagnosis not present

## 2020-04-19 DIAGNOSIS — I129 Hypertensive chronic kidney disease with stage 1 through stage 4 chronic kidney disease, or unspecified chronic kidney disease: Secondary | ICD-10-CM | POA: Diagnosis not present

## 2020-04-19 DIAGNOSIS — C3432 Malignant neoplasm of lower lobe, left bronchus or lung: Secondary | ICD-10-CM | POA: Diagnosis not present

## 2020-04-19 DIAGNOSIS — N183 Chronic kidney disease, stage 3 unspecified: Secondary | ICD-10-CM | POA: Diagnosis not present

## 2020-04-19 DIAGNOSIS — I480 Paroxysmal atrial fibrillation: Secondary | ICD-10-CM | POA: Diagnosis not present

## 2020-04-19 DIAGNOSIS — E43 Unspecified severe protein-calorie malnutrition: Secondary | ICD-10-CM | POA: Diagnosis not present

## 2020-04-19 DIAGNOSIS — I951 Orthostatic hypotension: Secondary | ICD-10-CM | POA: Diagnosis not present

## 2020-04-19 DIAGNOSIS — F419 Anxiety disorder, unspecified: Secondary | ICD-10-CM | POA: Diagnosis not present

## 2020-04-19 DIAGNOSIS — M80051D Age-related osteoporosis with current pathological fracture, right femur, subsequent encounter for fracture with routine healing: Secondary | ICD-10-CM | POA: Diagnosis not present

## 2020-04-19 DIAGNOSIS — H9190 Unspecified hearing loss, unspecified ear: Secondary | ICD-10-CM | POA: Diagnosis not present

## 2020-04-19 DIAGNOSIS — I700 Atherosclerosis of aorta: Secondary | ICD-10-CM | POA: Diagnosis not present

## 2020-04-19 NOTE — Progress Notes (Deleted)
     Established patient visit   Patient: Bianca [name]   DOB: 05-13-1941   79 y.o. Female  MRN: [id number] Visit Date: 04/22/2020  Today's healthcare provider: Trinna Post, PA-C   No chief complaint on file.  Subjective    HPI    {Show patient history (optional):23778::" "}   Medications: Outpatient Medications Prior to Visit  Medication Sig  . acetaminophen (TYLENOL) 500 MG tablet Take 1,000 mg by mouth every 6 (six) hours as needed for headache (pain).  . ADVAIR DISKUS 100-50 MCG/DOSE AEPB INHALE 1 PUFF TWICE A DAY AS DIRECTED **RINSE MOUTH AFTER USE**  . apixaban (ELIQUIS) 2.5 MG TABS tablet Take 1 tablet (2.5 mg total) by mouth 2 (two) times daily.  . busPIRone (BUSPAR) 15 MG tablet Take 1 tablet (15 mg total) by mouth 2 (two) times daily. (Patient taking differently: Take 15 mg by mouth daily. )  . Calcium Carb-Cholecalciferol (CALCIUM+D3 PO) Take 1 tablet by mouth daily.  . Cholecalciferol (VITAMIN D3) 50 MCG (2000 UT) TABS Take 2,000 Units by mouth daily.   . ferrous gluconate (FERGON) 324 MG tablet Take 1 tablet (324 mg total) by mouth 2 (two) times daily with a meal.  . lactose free nutrition (BOOST) LIQD Take 237 mLs by mouth 2 (two) times daily between meals.  . midodrine (PROAMATINE) 2.5 MG tablet Take 1 tablet (2.5 mg total) by mouth 3 (three) times daily with meals.  . mirtazapine (REMERON) 15 MG tablet Take 1 tablet (15 mg total) by mouth at bedtime.  Marland Kitchen PROAIR HFA 108 (90 Base) MCG/ACT inhaler INHALE 1-2 PUFFS INTO THE LUNGS EVERY 4 HOURS AS NEEDED (Patient taking differently: Inhale 1-2 puffs into the lungs every 4 (four) hours as needed for wheezing or shortness of breath. )  . pyridostigmine (MESTINON) 60 MG tablet Take 0.5 tablets (30 mg total) by mouth 2 (two) times daily.  Marland Kitchen UNABLE TO FIND Rx: [id number]- Post Surgical Bras (Quantity: 6)\ Dx: 174.9; Left mastectomy  . vitamin C (ASCORBIC ACID) 500 MG tablet Take 500 mg by mouth daily.    No  facility-administered medications prior to visit.    Review of Systems  {Heme  Chem  Endocrine  Serology  Results Review (optional):23779::" "}  Objective    LMP  (LMP Unknown)  {Show previous vital signs (optional):23777::" "}  Physical Exam  ***  No results found for any visits on 04/22/20.  Assessment & Plan     ***  No follow-ups on file.      {provider attestation***:1}   Paulene Floor  [facility] [phone] (phone) [phone] (fax)  [place]

## 2020-04-19 NOTE — Telephone Encounter (Signed)
STACEY L DAVID calling from [facility] is calling for PT orders 1 time a week for 1 week, 2 times a week for 3 week, 1 time a week for 1 week. Adding home health Aide- twice a week for 3 weeks, adding social work for 1 week 1. Call Back - [phone] ok to leave verbal on VM.

## 2020-04-22 ENCOUNTER — Ambulatory Visit: Payer: Self-pay | Admitting: Physician Assistant

## 2020-04-22 NOTE — Telephone Encounter (Signed)
Advised Stacey as below.  °

## 2020-04-22 NOTE — Telephone Encounter (Signed)
OK to give verbal.

## 2020-05-01 ENCOUNTER — Other Ambulatory Visit: Payer: Self-pay | Admitting: Internal Medicine

## 2020-05-01 DIAGNOSIS — J90 Pleural effusion, not elsewhere classified: Secondary | ICD-10-CM | POA: Diagnosis not present

## 2020-05-01 DIAGNOSIS — R918 Other nonspecific abnormal finding of lung field: Secondary | ICD-10-CM | POA: Diagnosis not present

## 2020-05-01 DIAGNOSIS — Z902 Acquired absence of lung [part of]: Secondary | ICD-10-CM | POA: Diagnosis not present

## 2020-05-01 DIAGNOSIS — M47814 Spondylosis without myelopathy or radiculopathy, thoracic region: Secondary | ICD-10-CM | POA: Diagnosis not present

## 2020-05-01 DIAGNOSIS — Z87891 Personal history of nicotine dependence: Secondary | ICD-10-CM | POA: Diagnosis not present

## 2020-05-01 DIAGNOSIS — C3492 Malignant neoplasm of unspecified part of left bronchus or lung: Secondary | ICD-10-CM | POA: Diagnosis not present

## 2020-05-01 DIAGNOSIS — J9811 Atelectasis: Secondary | ICD-10-CM | POA: Diagnosis not present

## 2020-05-02 ENCOUNTER — Other Ambulatory Visit: Payer: Self-pay | Admitting: Physician Assistant

## 2020-05-03 ENCOUNTER — Other Ambulatory Visit: Payer: Self-pay | Admitting: Internal Medicine

## 2020-05-07 ENCOUNTER — Telehealth: Payer: Self-pay | Admitting: Internal Medicine

## 2020-05-07 MED ORDER — PYRIDOSTIGMINE BROMIDE 60 MG PO TABS: 30.0000 mg | ORAL_TABLET | Freq: Two times a day (BID) | ORAL | 11 refills | Status: DC

## 2020-05-07 NOTE — Telephone Encounter (Signed)
Medication filled as requested.

## 2020-05-07 NOTE — Telephone Encounter (Signed)
Pt is requesting a refill on pyridostigmine. Would Dr. Lovena Le like to refill this medication? Please address

## 2020-05-07 NOTE — Telephone Encounter (Signed)
*  STAT* If patient is at the pharmacy, call can be transferred to refill team.   1. Which medications need to be refilled? (please list name of each medication and dose if known) pyridostigmine (MESTINON) 60 MG tablet [[id number]] ENDED   2. Which pharmacy/location (including street and city if local pharmacy) is medication to be sent to? [facility] - [place], [place] - [address]  [place], [place] [address]  Phone:  [phone] Fax:  [phone]  3. Do they need a 30 day or 90 day supply? [place]

## 2020-05-16 ENCOUNTER — Telehealth: Payer: Self-pay | Admitting: Physician Assistant

## 2020-05-16 NOTE — Telephone Encounter (Signed)
Called to request verbal orders to recertify patient for PT - 2wk 2, 1wk 3 .  Any questions, please call [phone] on a secured line.

## 2020-05-17 NOTE — Telephone Encounter (Signed)
Caller name: Glory Buff  Relation to pt: PT from [facility] Call back number: [phone]    Reason for call: checking on the status of verbal orders below, please advise

## 2020-05-20 DIAGNOSIS — I480 Paroxysmal atrial fibrillation: Secondary | ICD-10-CM | POA: Diagnosis not present

## 2020-05-20 DIAGNOSIS — I129 Hypertensive chronic kidney disease with stage 1 through stage 4 chronic kidney disease, or unspecified chronic kidney disease: Secondary | ICD-10-CM | POA: Diagnosis not present

## 2020-05-20 DIAGNOSIS — J439 Emphysema, unspecified: Secondary | ICD-10-CM | POA: Diagnosis not present

## 2020-05-20 DIAGNOSIS — F32A Depression, unspecified: Secondary | ICD-10-CM | POA: Diagnosis not present

## 2020-05-20 DIAGNOSIS — C3432 Malignant neoplasm of lower lobe, left bronchus or lung: Secondary | ICD-10-CM | POA: Diagnosis not present

## 2020-05-20 DIAGNOSIS — I951 Orthostatic hypotension: Secondary | ICD-10-CM | POA: Diagnosis not present

## 2020-05-20 DIAGNOSIS — Z483 Aftercare following surgery for neoplasm: Secondary | ICD-10-CM | POA: Diagnosis not present

## 2020-05-20 DIAGNOSIS — M80051D Age-related osteoporosis with current pathological fracture, right femur, subsequent encounter for fracture with routine healing: Secondary | ICD-10-CM | POA: Diagnosis not present

## 2020-05-20 DIAGNOSIS — I700 Atherosclerosis of aorta: Secondary | ICD-10-CM | POA: Diagnosis not present

## 2020-05-20 DIAGNOSIS — F419 Anxiety disorder, unspecified: Secondary | ICD-10-CM | POA: Diagnosis not present

## 2020-05-20 DIAGNOSIS — N183 Chronic kidney disease, stage 3 unspecified: Secondary | ICD-10-CM | POA: Diagnosis not present

## 2020-05-20 DIAGNOSIS — E43 Unspecified severe protein-calorie malnutrition: Secondary | ICD-10-CM | POA: Diagnosis not present

## 2020-05-20 DIAGNOSIS — D509 Iron deficiency anemia, unspecified: Secondary | ICD-10-CM | POA: Diagnosis not present

## 2020-05-20 NOTE — Telephone Encounter (Signed)
Bianca [name] calling again to have an update. She states that pt is scheduled this week, but cannot be seen until approval is given. Please advise.

## 2020-05-20 NOTE — Telephone Encounter (Signed)
Fine to give verbal order.

## 2020-05-21 NOTE — Telephone Encounter (Signed)
Spoke with [place] w/ [facility] and advised her per Fabio Bering it was okay to move forward with PT for patient. She stated that someone from the office had called and gave a verbal order already.

## 2020-05-22 DIAGNOSIS — C3492 Malignant neoplasm of unspecified part of left bronchus or lung: Secondary | ICD-10-CM | POA: Diagnosis not present

## 2020-05-29 ENCOUNTER — Telehealth: Payer: Self-pay

## 2020-05-29 NOTE — Telephone Encounter (Signed)
Copied from [place] [id number]. Topic: General - Inquiry >> May 29, 2020 10:38 AM Gillis Ends D wrote: Reason for CRM: She is going to have a missed PT meeting today. She has a dentist appointment today. However the therapist will make it up one day this week. Please advise

## 2020-06-03 ENCOUNTER — Telehealth: Payer: Self-pay | Admitting: Physician Assistant

## 2020-06-03 ENCOUNTER — Other Ambulatory Visit: Payer: Self-pay | Admitting: Internal Medicine

## 2020-06-03 NOTE — Telephone Encounter (Signed)
Prescription refill request for Eliquis received.  Indication: Afibb Last office visit: Lovena Le 10/18/2019 Scr: 1.04, 03/22/2020 Age:  79 yo Weight: 49.4 kg   Prescription refill sent.

## 2020-06-03 NOTE — Telephone Encounter (Signed)
Fine for verbal.

## 2020-06-03 NOTE — Telephone Encounter (Signed)
Home Health Verbal Orders - Caller/Agency: [facility] Number: [phone] secure vm can be left  Requesting PT Frequency: 2 x's a week for 2 weeks 1x a week for 2 weeks    Pt has missed 2 weeks of PT due to multiple Dr appts and they want to add those missed visits

## 2020-06-04 NOTE — Telephone Encounter (Signed)
Bianca [name], PT returned call, given verbal orders as indicated by Fabio Bering.

## 2020-06-04 NOTE — Telephone Encounter (Signed)
Returned Bianca [name] call to advise of verbal order and no answer, if Bianca [name] calls back okay for [facility] to advise her of Fabio Bering okay for verbal order.

## 2020-06-12 ENCOUNTER — Telehealth: Payer: Self-pay | Admitting: Internal Medicine

## 2020-06-12 NOTE — Telephone Encounter (Signed)
Pt c/o medication issue:  1. Name of Medication: midodrine (PROAMATINE) 2.5 MG tablet  2. How are you currently taking this medication (dosage and times per day)? 1 tablet 3 times a day  3. Are you having a reaction (difficulty breathing--STAT)? no  4. What is your medication issue? Patient's daughter states the patient's surgeon at [facility] switched her to 2.5 mg instead of 5 mg. She states today with her physical therapist the patient's BP was 140, but when she got up and tried walking it dropped to 104. She states it also happened Monday. Please advise.

## 2020-06-18 ENCOUNTER — Other Ambulatory Visit: Payer: Self-pay

## 2020-06-18 ENCOUNTER — Encounter: Payer: Self-pay | Admitting: Internal Medicine

## 2020-06-18 ENCOUNTER — Ambulatory Visit (INDEPENDENT_AMBULATORY_CARE_PROVIDER_SITE_OTHER): Payer: PPO | Admitting: Internal Medicine

## 2020-06-18 VITALS — BP 114/60 | HR 98 | Ht 65.0 in | Wt 105.0 lb

## 2020-06-18 DIAGNOSIS — I951 Orthostatic hypotension: Secondary | ICD-10-CM | POA: Diagnosis not present

## 2020-06-18 DIAGNOSIS — I480 Paroxysmal atrial fibrillation: Secondary | ICD-10-CM | POA: Diagnosis not present

## 2020-06-18 MED ORDER — MIDODRINE HCL 5 MG PO TABS: ORAL_TABLET | ORAL | 3 refills | Status: DC

## 2020-06-18 NOTE — Patient Instructions (Addendum)
Medication Instructions:  Your physician has recommended you make the following change in your medication:   1.  MIDODRINE 5 mg-   A.  Do NOT take if systolic blood pressure is greater than 160  B.  For systolic blood pressure [id number] take 1/2 tablet (2.5 mg)  C.  If systolic blood pressure less than 120 - Take 1 tablet (5 mg)  Labwork: None ordered.  Testing/Procedures: None ordered.  Follow-Up: Your physician wants you to follow-up in: 4 months with Dr. Lovena Le.     Any Other Special Instructions Will Be Listed Below (If Applicable).  If you need a refill on your cardiac medications before your next appointment, please call your pharmacy.

## 2020-06-18 NOTE — Progress Notes (Signed)
HPI Mrs. Houde returns today for followup of orthostasis. She has PAF and on systemic anti-coagulation. She also has HTN. We saw her last about 12 months ago and she has done better with no frank syncope. She has tried to eat more salt. She denies chest pain or sob. She has been taking toprol in low dose but also proamitine. Allergies  Allergen Reactions   Xanax [Alprazolam] Other (See Comments)    combative     Current Outpatient Medications  Medication Sig Dispense Refill   acetaminophen (TYLENOL) 500 MG tablet Take 1,000 mg by mouth every 6 (six) hours as needed for headache (pain).     ADVAIR DISKUS 100-50 MCG/DOSE AEPB INHALE 1 PUFF TWICE A DAY AS DIRECTED **RINSE MOUTH AFTER USE** 60 each 5   busPIRone (BUSPAR) 15 MG tablet Take 1 tablet (15 mg total) by mouth 2 (two) times daily. 4 tablet 0   Calcium Carb-Cholecalciferol (CALCIUM+D3 PO) Take 1 tablet by mouth daily.     Cholecalciferol (VITAMIN D3) 50 MCG (2000 UT) TABS Take 2,000 Units by mouth daily.      ELIQUIS 2.5 MG TABS tablet TAKE 1 TABLET BY MOUTH TWICE DAILY 60 tablet 5   ferrous gluconate (FERGON) 324 MG tablet Take 1 tablet (324 mg total) by mouth 2 (two) times daily with a meal.  3   lactose free nutrition (BOOST) LIQD Take 237 mLs by mouth 2 (two) times daily between meals.     mirtazapine (REMERON) 15 MG tablet Take 1 tablet (15 mg total) by mouth at bedtime. 4 tablet 0   PROAIR HFA 108 (90 Base) MCG/ACT inhaler INHALE 1-2 PUFFS INTO THE LUNGS EVERY 4 HOURS AS NEEDED 8.5 g 3   pyridostigmine (MESTINON) 60 MG tablet Take 0.5 tablets (30 mg total) by mouth 2 (two) times daily. 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      midodrine (PROAMATINE) 5 MG tablet Take 1/2 tablet for systolic blood pressure between 120 - 160.  Take 1 tablet for systolic blood pressure  less than 120. 270 tablet 3   No current facility-administered medications for this visit.     Past Medical History:  Diagnosis Date   Anxiety    Arthritis    Asthma    COPD (chronic obstructive pulmonary disease) ([place])    HOH (hard of hearing)    Hypertension    Wears dentures    top and partial bottom   Wears glasses     ROS:   All systems reviewed and negative except as noted in the HPI.   Past Surgical History:  Procedure Laterality Date   APPENDECTOMY     BREAST LUMPECTOMY WITH NEEDLE LOCALIZATION Right 04/21/2013   Procedure: BREAST LUMPECTOMY WITH NEEDLE LOCALIZATION;  Surgeon: Harl Bowie, MD;  Location: [facility];  Service: General;  Laterality: Right;   BREAST SURGERY  1985   cyst rt br   MASTECTOMY W/ SENTINEL NODE BIOPSY Left 04/21/2013   Procedure: MASTECTOMY WITH SENTINEL LYMPH NODE BIOPSY;  Surgeon: Harl Bowie, MD;  Location: [facility];  Service: General;  Laterality: Left;   OTHER SURGICAL HISTORY     cysts removed from foot   OTHER SURGICAL HISTORY     cysts removed from breast   TOTAL HIP ARTHROPLASTY  Right 02/13/2020   Procedure: TOTAL HIP ARTHROPLASTY ANTERIOR APPROACH;  Surgeon: Mcarthur Rossetti, MD;  Location: [facility];  Service: Orthopedics;  Laterality: Right;   TUBAL LIGATION       Family History  Problem Relation Age of Onset   CAD Mother        possible small heart attack   Thyroid disease Sister        she thinks hyperthyroidism     Social History   Socioeconomic History   Marital status: Widowed    Spouse name: Not on file   Number of children: 2   Years of education: Not on file   Highest education level: Some college, no degree  Occupational History   Occupation: retired    Fish farm manager: RETIRED  Tobacco Use   Smoking status: Current Every Day Smoker    Packs/day: 0.25    Types: Cigarettes   Smokeless tobacco: Never Used  Substance and Sexual Activity    Alcohol use: No   Drug use: No   Sexual activity: Not on file    Comment: 1-2 daily  Other Topics Concern   Not on file  Social History Narrative   Has a living will   Social Determinants of Health   Financial Resource Strain: Low Risk    Difficulty of Paying Living Expenses: Not hard at all  Food Insecurity: No Food Insecurity   Worried About Charity fundraiser in the Last Year: Never true   [place] in the Last Year: Never true  Transportation Needs: No Transportation Needs   Lack of Transportation (Medical): No   Lack of Transportation (Non-Medical): No  Physical Activity: Inactive   Days of Exercise per Week: 0 days   Minutes of Exercise per Session: 0 min  Stress: No Stress Concern Present   Feeling of Stress : Not at all  Social Connections: Socially Isolated   Frequency of Communication with Friends and Family: More than three times a week   Frequency of Social Gatherings with Friends and Family: More than three times a week   Attends Religious Services: Never   Marine scientist or Organizations: No   Attends Archivist Meetings: Never   Marital Status: Widowed  Human resources officer Violence: Not At Risk   Fear of Current or Ex-Partner: No   Emotionally Abused: No   Physically Abused: No   Sexually Abused: No     BP 114/60    Pulse 98    Ht 5\' 5"  (1.651 m)    Wt 105 lb (47.6 kg)    LMP  (LMP Unknown)    SpO2 95%    BMI 17.47 kg/m   Physical Exam:  Well appearing NAD HEENT: Unremarkable Neck:  No JVD, no thyromegally Lymphatics:  No adenopathy Back:  No CVA tenderness Lungs:  Clear with no wheezes HEART:  Regular rate rhythm, no murmurs, no rubs, no clicks Abd:  soft, positive bowel sounds, no organomegally, no rebound, no guarding Ext:  2 plus pulses, no edema, no cyanosis, no clubbing Skin:  No rashes no nodules Neuro:  CN II through XII intact, motor grossly intact  Assess/Plan: 1. Autonomic dysfunction - I  discussed medication adjustment with her daughter who is with her today. We will ask her to take 5 mg of midodrine tid if bp is below 120 and hold midodrine if SBP is above 160.  2. HTN - as above. Her bp has mostly been well controlled. We will follow.  Carleene Overlie [place],MD

## 2020-06-19 DIAGNOSIS — F32A Depression, unspecified: Secondary | ICD-10-CM | POA: Diagnosis not present

## 2020-06-19 DIAGNOSIS — N183 Chronic kidney disease, stage 3 unspecified: Secondary | ICD-10-CM | POA: Diagnosis not present

## 2020-06-19 DIAGNOSIS — D509 Iron deficiency anemia, unspecified: Secondary | ICD-10-CM | POA: Diagnosis not present

## 2020-06-19 DIAGNOSIS — M80051D Age-related osteoporosis with current pathological fracture, right femur, subsequent encounter for fracture with routine healing: Secondary | ICD-10-CM | POA: Diagnosis not present

## 2020-06-19 DIAGNOSIS — I129 Hypertensive chronic kidney disease with stage 1 through stage 4 chronic kidney disease, or unspecified chronic kidney disease: Secondary | ICD-10-CM | POA: Diagnosis not present

## 2020-06-19 DIAGNOSIS — I951 Orthostatic hypotension: Secondary | ICD-10-CM | POA: Diagnosis not present

## 2020-06-19 DIAGNOSIS — E43 Unspecified severe protein-calorie malnutrition: Secondary | ICD-10-CM | POA: Diagnosis not present

## 2020-06-19 DIAGNOSIS — C3432 Malignant neoplasm of lower lobe, left bronchus or lung: Secondary | ICD-10-CM | POA: Diagnosis not present

## 2020-06-19 DIAGNOSIS — I700 Atherosclerosis of aorta: Secondary | ICD-10-CM | POA: Diagnosis not present

## 2020-06-19 DIAGNOSIS — J439 Emphysema, unspecified: Secondary | ICD-10-CM | POA: Diagnosis not present

## 2020-06-19 DIAGNOSIS — Z483 Aftercare following surgery for neoplasm: Secondary | ICD-10-CM | POA: Diagnosis not present

## 2020-06-19 DIAGNOSIS — I480 Paroxysmal atrial fibrillation: Secondary | ICD-10-CM | POA: Diagnosis not present

## 2020-06-19 DIAGNOSIS — F419 Anxiety disorder, unspecified: Secondary | ICD-10-CM | POA: Diagnosis not present

## 2020-07-08 ENCOUNTER — Telehealth: Payer: Self-pay

## 2020-07-08 NOTE — Telephone Encounter (Signed)
Patient is aware that Fabio Bering is out of the office this week. She states she is fine with waiting until Fabio Bering returns next week on 07/15/2020 for a return call.

## 2020-07-08 NOTE — Telephone Encounter (Signed)
Copied from [place] [id number]. Topic: General - Inquiry >> Jul 08, 2020  1:04 PM Greggory Keen D wrote: Reason for CRM: Pt called asking if Fabio Bering would call her back regarding a neurologist in the [place] area.    CB#  [phone]

## 2020-07-09 ENCOUNTER — Telehealth: Payer: Self-pay | Admitting: Internal Medicine

## 2020-07-09 DIAGNOSIS — I951 Orthostatic hypotension: Secondary | ICD-10-CM

## 2020-07-09 NOTE — Telephone Encounter (Signed)
Pt c/o medication issue:  1. Name of Medication: midodrine (PROAMATINE) 5 MG tablet  2. How are you currently taking this medication (dosage and times per day)? As directed  3. Are you having a reaction (difficulty breathing--STAT)?   4. What is your medication issue? Daughter is worried about the patient's BP dropping low  Pt c/o BP issue: STAT if pt c/o blurred vision, one-sided weakness or slurred speech  1. What are your last 5 BP readings? Daughter does not keep a log   2. Are you having any other symptoms (ex. Dizziness, headache, blurred vision, passed out)? Dizziness, fell on Saturday   3. What is your BP issue? Daughter is worried about the patient's BP dropping when she goes to stand up.

## 2020-07-09 NOTE — Telephone Encounter (Signed)
Spoke with daughter.  Per daughter Pt's blood pressure has been running low, less than 612 systolic.   Pt's daughter has been giving Pt midodrine 5 mg TID.  Per daughter, even with midodrine Pt's daughter has had to lay Pt down when Pt has become hypotensive.  Daughter is concerned.  Will discuss with DR. Lovena Le.  Advised it might not be until later this week that this nurse can follow up.  Will discuss with DR. Taylor at next available time.

## 2020-07-11 MED ORDER — FLUDROCORTISONE ACETATE 0.1 MG PO TABS: 0.1000 mg | ORAL_TABLET | Freq: Two times a day (BID) | ORAL | 3 refills | Status: DC

## 2020-07-11 NOTE — Telephone Encounter (Signed)
Dr. Lovena Le advised to start florinef 0.1 mg bid at 7:00 am and 1:00 pm  Spoke to the patient and daughter and they are in agreement and verbalize understanding.  Sent to pharmacy requested.

## 2020-07-15 ENCOUNTER — Telehealth: Payer: Self-pay | Admitting: Internal Medicine

## 2020-07-15 ENCOUNTER — Other Ambulatory Visit: Payer: Self-pay

## 2020-07-15 ENCOUNTER — Observation Stay (HOSPITAL_COMMUNITY): Payer: PPO

## 2020-07-15 ENCOUNTER — Inpatient Hospital Stay (HOSPITAL_COMMUNITY)
Admission: EM | Admit: 2020-07-15 | Discharge: 2020-07-21 | DRG: 069 | Disposition: A | Discharge status: Skilled Nursing Facility | Payer: PPO | Attending: Student in an Organized Health Care Education/Training Program | Admitting: Student in an Organized Health Care Education/Training Program

## 2020-07-15 ENCOUNTER — Emergency Department (HOSPITAL_COMMUNITY): Payer: PPO

## 2020-07-15 DIAGNOSIS — Z96641 Presence of right artificial hip joint: Secondary | ICD-10-CM | POA: Diagnosis present

## 2020-07-15 DIAGNOSIS — J449 Chronic obstructive pulmonary disease, unspecified: Secondary | ICD-10-CM | POA: Diagnosis not present

## 2020-07-15 DIAGNOSIS — E43 Unspecified severe protein-calorie malnutrition: Secondary | ICD-10-CM | POA: Diagnosis not present

## 2020-07-15 DIAGNOSIS — G459 Transient cerebral ischemic attack, unspecified: Secondary | ICD-10-CM | POA: Diagnosis not present

## 2020-07-15 DIAGNOSIS — E785 Hyperlipidemia, unspecified: Secondary | ICD-10-CM | POA: Diagnosis not present

## 2020-07-15 DIAGNOSIS — I951 Orthostatic hypotension: Secondary | ICD-10-CM | POA: Diagnosis not present

## 2020-07-15 DIAGNOSIS — N179 Acute kidney failure, unspecified: Secondary | ICD-10-CM | POA: Diagnosis present

## 2020-07-15 DIAGNOSIS — I4891 Unspecified atrial fibrillation: Secondary | ICD-10-CM | POA: Diagnosis not present

## 2020-07-15 DIAGNOSIS — N183 Chronic kidney disease, stage 3 unspecified: Secondary | ICD-10-CM | POA: Diagnosis present

## 2020-07-15 DIAGNOSIS — Z85118 Personal history of other malignant neoplasm of bronchus and lung: Secondary | ICD-10-CM | POA: Diagnosis not present

## 2020-07-15 DIAGNOSIS — Z20822 Contact with and (suspected) exposure to covid-19: Secondary | ICD-10-CM | POA: Diagnosis present

## 2020-07-15 DIAGNOSIS — R4781 Slurred speech: Secondary | ICD-10-CM | POA: Diagnosis present

## 2020-07-15 DIAGNOSIS — C3432 Malignant neoplasm of lower lobe, left bronchus or lung: Secondary | ICD-10-CM | POA: Diagnosis not present

## 2020-07-15 DIAGNOSIS — I480 Paroxysmal atrial fibrillation: Secondary | ICD-10-CM | POA: Diagnosis present

## 2020-07-15 DIAGNOSIS — R55 Syncope and collapse: Secondary | ICD-10-CM | POA: Diagnosis not present

## 2020-07-15 DIAGNOSIS — R339 Retention of urine, unspecified: Secondary | ICD-10-CM | POA: Diagnosis present

## 2020-07-15 DIAGNOSIS — Z7951 Long term (current) use of inhaled steroids: Secondary | ICD-10-CM | POA: Diagnosis not present

## 2020-07-15 DIAGNOSIS — Z902 Acquired absence of lung [part of]: Secondary | ICD-10-CM

## 2020-07-15 DIAGNOSIS — Z7901 Long term (current) use of anticoagulants: Secondary | ICD-10-CM

## 2020-07-15 DIAGNOSIS — R000 Tachycardia, unspecified: Secondary | ICD-10-CM | POA: Diagnosis not present

## 2020-07-15 DIAGNOSIS — G9349 Other encephalopathy: Secondary | ICD-10-CM | POA: Diagnosis not present

## 2020-07-15 DIAGNOSIS — I10 Essential (primary) hypertension: Secondary | ICD-10-CM | POA: Diagnosis not present

## 2020-07-15 DIAGNOSIS — Z681 Body mass index (BMI) 19 or less, adult: Secondary | ICD-10-CM | POA: Diagnosis not present

## 2020-07-15 DIAGNOSIS — R531 Weakness: Secondary | ICD-10-CM | POA: Diagnosis not present

## 2020-07-15 DIAGNOSIS — Z87891 Personal history of nicotine dependence: Secondary | ICD-10-CM

## 2020-07-15 DIAGNOSIS — R54 Age-related physical debility: Secondary | ICD-10-CM | POA: Diagnosis not present

## 2020-07-15 DIAGNOSIS — I2510 Atherosclerotic heart disease of native coronary artery without angina pectoris: Secondary | ICD-10-CM | POA: Diagnosis not present

## 2020-07-15 DIAGNOSIS — R29810 Facial weakness: Secondary | ICD-10-CM | POA: Diagnosis present

## 2020-07-15 DIAGNOSIS — R0902 Hypoxemia: Secondary | ICD-10-CM | POA: Diagnosis not present

## 2020-07-15 DIAGNOSIS — Z79899 Other long term (current) drug therapy: Secondary | ICD-10-CM | POA: Diagnosis not present

## 2020-07-15 DIAGNOSIS — B961 Klebsiella pneumoniae [K. pneumoniae] as the cause of diseases classified elsewhere: Secondary | ICD-10-CM | POA: Diagnosis present

## 2020-07-15 DIAGNOSIS — I129 Hypertensive chronic kidney disease with stage 1 through stage 4 chronic kidney disease, or unspecified chronic kidney disease: Secondary | ICD-10-CM | POA: Diagnosis present

## 2020-07-15 DIAGNOSIS — R0602 Shortness of breath: Secondary | ICD-10-CM | POA: Diagnosis not present

## 2020-07-15 DIAGNOSIS — R402411 Glasgow coma scale score 13-15, in the field [EMT or ambulance]: Secondary | ICD-10-CM | POA: Diagnosis not present

## 2020-07-15 DIAGNOSIS — M2550 Pain in unspecified joint: Secondary | ICD-10-CM | POA: Diagnosis not present

## 2020-07-15 DIAGNOSIS — R4182 Altered mental status, unspecified: Secondary | ICD-10-CM | POA: Diagnosis not present

## 2020-07-15 DIAGNOSIS — R8271 Bacteriuria: Secondary | ICD-10-CM | POA: Diagnosis present

## 2020-07-15 DIAGNOSIS — E7800 Pure hypercholesterolemia, unspecified: Secondary | ICD-10-CM | POA: Diagnosis not present

## 2020-07-15 DIAGNOSIS — Z7401 Bed confinement status: Secondary | ICD-10-CM | POA: Diagnosis not present

## 2020-07-15 DIAGNOSIS — Z7952 Long term (current) use of systemic steroids: Secondary | ICD-10-CM

## 2020-07-15 LAB — HEMOGLOBIN A1C
Hgb A1c MFr Bld: 5.2 % (ref 4.8–5.6)
Mean Plasma Glucose: 102.54 mg/dL

## 2020-07-15 LAB — URINALYSIS, ROUTINE W REFLEX MICROSCOPIC
Bilirubin Urine: NEGATIVE
Glucose, UA: NEGATIVE mg/dL
Ketones, ur: NEGATIVE mg/dL
Nitrite: NEGATIVE
Protein, ur: NEGATIVE mg/dL
Specific Gravity, Urine: 1.006 (ref 1.005–1.030)
WBC, UA: >50 WBC/hpf — ABNORMAL HIGH (ref 0–5)
pH: 7 (ref 5.0–8.0)

## 2020-07-15 LAB — CBC WITH DIFFERENTIAL/PLATELET
Abs Immature Granulocytes: 0.02 10*3/uL (ref 0.00–0.07)
Basophils Absolute: 0 10*3/uL (ref 0.0–0.1)
Basophils Relative: 1 %
Eosinophils Absolute: 0.1 10*3/uL (ref 0.0–0.5)
Eosinophils Relative: 1 %
HCT: 34.4 % — ABNORMAL LOW (ref 36.0–46.0)
Hemoglobin: 10.8 g/dL — ABNORMAL LOW (ref 12.0–15.0)
Immature Granulocytes: 0 %
Lymphocytes Relative: 20 %
Lymphs Abs: 1.7 10*3/uL (ref 0.7–4.0)
MCH: 25.4 pg — ABNORMAL LOW (ref 26.0–34.0)
MCHC: 31.4 g/dL (ref 30.0–36.0)
MCV: 80.9 fL (ref 80.0–100.0)
Monocytes Absolute: 0.7 10*3/uL (ref 0.1–1.0)
Monocytes Relative: 9 %
Neutro Abs: 5.8 10*3/uL (ref 1.7–7.7)
Neutrophils Relative %: 69 %
Platelets: 369 10*3/uL (ref 150–400)
RBC: 4.25 MIL/uL (ref 3.87–5.11)
RDW: 18.6 % — ABNORMAL HIGH (ref 11.5–15.5)
WBC: 8.3 10*3/uL (ref 4.0–10.5)
nRBC: 0 % (ref 0.0–0.2)

## 2020-07-15 LAB — I-STAT CHEM 8, ED
BUN: 17 mg/dL (ref 8–23)
Calcium, Ion: 1.03 mmol/L — ABNORMAL LOW (ref 1.15–1.40)
Chloride: 109 mmol/L (ref 98–111)
Creatinine, Ser: 1.3 mg/dL — ABNORMAL HIGH (ref 0.44–1.00)
Glucose, Bld: 93 mg/dL (ref 70–99)
HCT: 35 % — ABNORMAL LOW (ref 36.0–46.0)
Hemoglobin: 11.9 g/dL — ABNORMAL LOW (ref 12.0–15.0)
Potassium: 3.7 mmol/L (ref 3.5–5.1)
Sodium: 139 mmol/L (ref 135–145)
TCO2: 23 mmol/L (ref 22–32)

## 2020-07-15 LAB — COMPREHENSIVE METABOLIC PANEL
ALT: 8 U/L (ref 0–44)
AST: 35 U/L (ref 15–41)
Albumin: 3.4 g/dL — ABNORMAL LOW (ref 3.5–5.0)
Alkaline Phosphatase: 69 U/L (ref 38–126)
Anion gap: 11 (ref 5–15)
BUN: 16 mg/dL (ref 8–23)
CO2: 22 mmol/L (ref 22–32)
Calcium: 8.9 mg/dL (ref 8.9–10.3)
Chloride: 105 mmol/L (ref 98–111)
Creatinine, Ser: 1.37 mg/dL — ABNORMAL HIGH (ref 0.44–1.00)
GFR, Estimated: 39 mL/min — ABNORMAL LOW (ref >60)
Glucose, Bld: 101 mg/dL — ABNORMAL HIGH (ref 70–99)
Potassium: 4.8 mmol/L (ref 3.5–5.1)
Sodium: 138 mmol/L (ref 135–145)
Total Bilirubin: 1.1 mg/dL (ref 0.3–1.2)
Total Protein: 6.6 g/dL (ref 6.5–8.1)

## 2020-07-15 LAB — LIPID PANEL
Cholesterol: 167 mg/dL (ref 0–200)
HDL: 53 mg/dL (ref >40)
LDL Cholesterol: 99 mg/dL (ref 0–99)
Total CHOL/HDL Ratio: 3.2 RATIO
Triglycerides: 75 mg/dL (ref <150)
VLDL: 15 mg/dL (ref 0–40)

## 2020-07-15 LAB — RAPID URINE DRUG SCREEN, HOSP PERFORMED
Amphetamines: NOT DETECTED
Barbiturates: NOT DETECTED
Benzodiazepines: NOT DETECTED
Cocaine: NOT DETECTED
Opiates: NOT DETECTED
Tetrahydrocannabinol: NOT DETECTED

## 2020-07-15 LAB — CBG MONITORING, ED: Glucose-Capillary: 98 mg/dL (ref 70–99)

## 2020-07-15 LAB — PROTIME-INR
INR: 1.1 (ref 0.8–1.2)
Prothrombin Time: 13.9 seconds (ref 11.4–15.2)

## 2020-07-15 LAB — SARS CORONAVIRUS 2 (TAT 6-24 HRS): SARS Coronavirus 2: NEGATIVE

## 2020-07-15 LAB — APTT: aPTT: 25 seconds (ref 24–36)

## 2020-07-15 MED ORDER — FLUDROCORTISONE ACETATE 0.1 MG PO TABS
0.1000 mg | ORAL_TABLET | Freq: Two times a day (BID) | ORAL | Status: DC
  Administered 2020-07-16 – 2020-07-20 (×8): 0.1 mg via ORAL
  Filled 2020-07-15 (×9): qty 1

## 2020-07-15 MED ORDER — LACTATED RINGERS IV SOLN: INTRAVENOUS | Status: AC

## 2020-07-15 MED ORDER — ACETAMINOPHEN 325 MG PO TABS: 650.0000 mg | ORAL_TABLET | Freq: Four times a day (QID) | ORAL | Status: DC | PRN

## 2020-07-15 MED ORDER — LACTATED RINGERS IV BOLUS
500.0000 mL | Freq: Once | INTRAVENOUS | Status: AC
  Administered 2020-07-15: 500 mL via INTRAVENOUS

## 2020-07-15 MED ORDER — ATORVASTATIN CALCIUM 40 MG PO TABS
40.0000 mg | ORAL_TABLET | Freq: Every day | ORAL | Status: DC
  Filled 2020-07-15: qty 1

## 2020-07-15 MED ORDER — ACETAMINOPHEN 650 MG RE SUPP: 650.0000 mg | Freq: Four times a day (QID) | RECTAL | Status: DC | PRN

## 2020-07-15 MED ORDER — MIDODRINE HCL 5 MG PO TABS
2.5000 mg | ORAL_TABLET | Freq: Every day | ORAL | Status: DC | PRN
Start: 2020-07-16 — End: 2020-07-22
  Filled 2020-07-15: qty 1

## 2020-07-15 MED ORDER — POLYETHYLENE GLYCOL 3350 17 G PO PACK
17.0000 g | PACK | Freq: Every day | ORAL | Status: DC | PRN
  Administered 2020-07-19: 17 g via ORAL
  Filled 2020-07-15: qty 1

## 2020-07-15 MED ORDER — MIDODRINE HCL 5 MG PO TABS
5.0000 mg | ORAL_TABLET | Freq: Every day | ORAL | Status: DC | PRN
  Administered 2020-07-16 – 2020-07-20 (×2): 5 mg via ORAL
  Filled 2020-07-15 (×3): qty 1

## 2020-07-15 MED ORDER — APIXABAN 2.5 MG PO TABS
2.5000 mg | ORAL_TABLET | Freq: Two times a day (BID) | ORAL | Status: DC
Start: 2020-07-16 — End: 2020-07-22
  Administered 2020-07-16 – 2020-07-21 (×12): 2.5 mg via ORAL
  Filled 2020-07-15 (×12): qty 1

## 2020-07-15 NOTE — Telephone Encounter (Signed)
Daughter states that we she got her mom back from the bathroom this morning she started leaning to one side, not swallowing her medications, different speech holding her tongue to one side. Advised to get her mother to the ER.  Verbalized understanding.

## 2020-07-15 NOTE — ED Notes (Signed)
Pt's CBG result 98. Informed Louie - RN.

## 2020-07-15 NOTE — ED Notes (Signed)
Patient transported to MRI 

## 2020-07-15 NOTE — Telephone Encounter (Signed)
Daughter of the patient called. Patient is on her way to [facility] via EMS. The daughter wanted to make Dr. Lovena Le and Dr. Radford Pax aware so that the patient is treated properly at the hospital. The patient said the last time she went to the hospital her medications were changed and it effected the patient.

## 2020-07-15 NOTE — H&P (Addendum)
Date: 07/15/2020               Patient Name:  Bianca [name] MRN: [id number]  DOB: [birth year] Age / Sex: 79 y.o., female   PCP: Trinna Post, PA-C         Medical Service: Internal Medicine Teaching Service         Attending Physician: Dr. Breck Coons, MD    First Contact: Dr. Shon Baton Pager: [phone]  Second Contact: Dr. Gilford Rile Pager: [phone]       After Hours (After 5p/  First Contact Pager: [phone]  weekends / holidays): Second Contact Pager: [phone]   Chief Complaint: Slurred Speech  History of Present Illness:  Bianca [name] is a 79 y.o. female with a PMH significant for COPD, Lung Cancer, orhtostatic hypotension, and A. Fib (on eliquis), who presents to the [facility] for slurred speech. Ms. Riederer daughter states that the patient was in her usual state of health this morning, when she began to have AMS, slurred speech with R facial droop,  slumped to the R, was unable to hold her drink, swallow her pills, and had a tremor. The event last approximately 10 minutes The patient does have a Hx of slumping over, which has been attributed to her orthostatic hypotension. The patient has had this history of her tremors for several months. The patient did not fall over during the event, but does endorse a fall from this past Saturday, but did not hit her head. Her daughter called EMS who also noted that the patient had slurred speech.   She denies headaches, SHOB, fevers, chest pain, abdominal pain, constipation, diarrhea, or weakness, numbness, tingling during this event.  During her ED course, it was found that her sCr increased from her baseline of 1.04 to 1.37, with the rest of her labs being unrevealing.  [facility] consulted for stroke workup.   Meds:    Current Meds  Medication Sig  . acetaminophen (TYLENOL) 500 MG tablet Take 1,000 mg by mouth every 6 (six) hours as needed (for headaches).  . ADVAIR DISKUS 100-50 MCG/DOSE AEPB INHALE 1 PUFF TWICE A DAY AS DIRECTED **RINSE MOUTH  AFTER USE** (Patient taking differently: Inhale 1 puff into the lungs See admin instructions. INHALE 1 PUFF INTO THE LUNGS TWICE A DAY AS DIRECTED- **RINSE MOUTH AFTER USE**)  . Calcium Carb-Cholecalciferol (CALCIUM+D3 PO) Take 1 tablet by mouth daily.  . Cholecalciferol (VITAMIN D3) 50 MCG (2000 UT) TABS Take 2,000 Units by mouth daily.   Marland Kitchen ELIQUIS 2.5 MG TABS tablet TAKE 1 TABLET BY MOUTH TWICE DAILY (Patient taking differently: Take 2.5 mg by mouth 2 (two) times daily.)  . ferrous gluconate (FERGON) 324 MG tablet Take 1 tablet (324 mg total) by mouth 2 (two) times daily with a meal.  . fexofenadine (ALLEGRA) 180 MG tablet Take 180 mg by mouth daily.  . fludrocortisone (FLORINEF) 0.1 MG tablet Take 1 tablet (0.1 mg total) by mouth 2 (two) times daily. Take at 7 am and 1 pm. (Patient taking differently: Take 0.1 mg by mouth See admin instructions. Take 0.1 mg by mouth at 7 AM and 1 PM)  . lactose free nutrition (BOOST) LIQD Take 237 mLs by mouth 2 (two) times daily between meals.  . midodrine (PROAMATINE) 5 MG tablet Take 1/2 tablet for systolic blood pressure between 120 - 160.  Take 1 tablet for systolic blood pressure less than 120. (Patient taking differently: Take 2.5-5 mg by mouth See admin instructions. Take  2.5 mg by mouth for systolic B/P between 924-268 and 5 mg for a systolic B/P less than 341)  . PROAIR HFA 108 (90 Base) MCG/ACT inhaler INHALE 1-2 PUFFS INTO THE LUNGS EVERY 4 HOURS AS NEEDED (Patient taking differently: Inhale 1-2 puffs into the lungs every 4 (four) hours as needed for wheezing or shortness of breath.)  . pyridostigmine (MESTINON) 60 MG tablet Take 0.5 tablets (30 mg total) by mouth 2 (two) times daily.  . vitamin C (ASCORBIC ACID) 500 MG tablet Take 500 mg by mouth daily.      Allergies: Allergies as of 07/15/2020 - Review Complete 07/15/2020  Allergen Reaction Noted  . Alprazolam Other (See Comments) 02/11/2020  . Buspar [buspirone] Other (See Comments) 07/15/2020   . Mirtazapine Other (See Comments) 07/15/2020  . Other Other (See Comments) 07/15/2020   Past Medical History:  Diagnosis Date  . Anxiety   . Arthritis   . Asthma   . COPD (chronic obstructive pulmonary disease) ([place])   . HOH (hard of hearing)   . Hypertension   . Wears dentures    top and partial bottom  . Wears glasses     Family History:  Family History  Problem Relation Age of Onset  . CAD Mother        possible small heart attack  . Thyroid disease Sister        she thinks hyperthyroidism  Cancer: Mother (Vulva) and Father (prostate)  Social History:  Tobacco: 0.5 pack a day since 79 years old. Stopped 7/21 ETOH: None  Drugs: None   Review of Systems: A complete ROS was negative except as per HPI.   Physical Exam: Blood pressure (!) 183/92, pulse 90, temperature 98.3 F (36.8 C), temperature source Oral, resp. rate 18, SpO2 99 %. Physical Exam Constitutional:      General: She is not in acute distress.    Appearance: Normal appearance. She is not ill-appearing, toxic-appearing or diaphoretic.  HENT:     Head: Normocephalic and atraumatic.  Cardiovascular:     Rate and Rhythm: Normal rate and regular rhythm.     Pulses: Normal pulses.     Heart sounds: Normal heart sounds. No murmur heard. No friction rub. No gallop.   Pulmonary:     Effort: Pulmonary effort is normal.     Breath sounds: Normal breath sounds. No wheezing, rhonchi or rales.  Chest:     Chest wall: No tenderness.  Abdominal:     General: Abdomen is flat. Bowel sounds are normal.     Palpations: Abdomen is soft.     Tenderness: There is no abdominal tenderness. There is no guarding.  Musculoskeletal:     Comments: msk 4/5 throughout bilaterally in the upper and lower extremities   Skin:    General: Skin is warm and dry.  Neurological:     General: No focal deficit present.     Mental Status: She is alert and oriented to person, place, and time.     Cranial Nerves: No cranial nerve  deficit.     Comments: CN II-XII intact bilaterally  Psychiatric:        Behavior: Behavior normal.     EKG: personally reviewed my interpretation is NSR  CT Head Wo Contrast  Result Date: 07/15/2020 CLINICAL DATA:  Transient ischemic attack (TIA) EXAM: CT HEAD WITHOUT CONTRAST TECHNIQUE: Contiguous axial images were obtained from the base of the skull through the vertex without intravenous contrast. COMPARISON:  02/12/2020 and prior. FINDINGS: Brain:  No acute infarct or intracranial hemorrhage. No mass lesion. No midline shift, ventriculomegaly or extra-axial fluid collection. Moderate cerebral atrophy with ex vacuo dilatation. Advanced chronic microvascular ischemic changes. Vascular: No hyperdense vessel or unexpected calcification. Skull base atherosclerotic calcifications. Skull: Negative for fracture or focal lesion. Sinuses/Orbits: Normal orbits. Clear paranasal sinuses. No mastoid effusion. Other: None. IMPRESSION: No acute intracranial process. Moderate cerebral atrophy and advanced chronic microvascular ischemic changes. Electronically Signed   By: Primitivo Gauze M.D.   On: 07/15/2020 13:19   MR ANGIO HEAD WO CONTRAST  Result Date: 07/15/2020 CLINICAL DATA:  Altered mental status, episode of right-sided paralysis and abnormal speech EXAM: MRI HEAD WITHOUT CONTRAST MRA HEAD WITHOUT CONTRAST MRA NECK WITHOUT CONTRAST TECHNIQUE: Multiplanar, multiecho pulse sequences of the brain and surrounding structures were obtained without intravenous contrast. Angiographic images of the Circle of Willis were obtained using MRA technique without intravenous contrast. Angiographic images of the neck were obtained using MRA technique without intravenous contrast. Carotid stenosis measurements (when applicable) are obtained utilizing NASCET criteria, using the distal internal carotid diameter as the denominator. COMPARISON:  None. FINDINGS: MRI HEAD Brain: There is no acute infarction or intracranial  hemorrhage. There is no intracranial mass, mass effect, or edema. There is no hydrocephalus or extra-axial fluid collection. Prominence of the ventricles and sulci reflects generalized parenchymal volume loss. Patchy and confluent areas of T2 hyperintensity in the supratentorial white matter are nonspecific but probably reflect moderate chronic microvascular ischemic changes. Focus of susceptibility hypointensity in the left cerebellum is most compatible with chronic microhemorrhage. Vascular: Major vessel flow voids at the skull base are preserved. Skull and upper cervical spine: Normal marrow signal is preserved. Sinuses/Orbits: Minor mucosal thickening.  Orbits are unremarkable. Other: Sella is unremarkable.  Mastoid air cells are clear. MRA HEAD Intracranial internal carotid arteries are patent. Middle and anterior cerebral arteries are patent. Intracranial vertebral arteries, basilar artery, posterior cerebral arteries are patent. Bilateral posterior communicating arteries are present. There is no significant stenosis or aneurysm identified. MRA NECK Common, internal, and external carotid arteries are patent. There is plaque at the left greater than right ICA origins without hemodynamically significant stenosis. Visualized codominant extracranial vertebral arteries are patent. IMPRESSION: No acute infarction, hemorrhage, or mass. Moderate chronic microvascular ischemic changes. No large vessel occlusion or hemodynamically significant stenosis. Electronically Signed   By: Macy Mis M.D.   On: 07/15/2020 18:19   MR ANGIO NECK WO CONTRAST  Result Date: 07/15/2020 CLINICAL DATA:  Altered mental status, episode of right-sided paralysis and abnormal speech EXAM: MRI HEAD WITHOUT CONTRAST MRA HEAD WITHOUT CONTRAST MRA NECK WITHOUT CONTRAST TECHNIQUE: Multiplanar, multiecho pulse sequences of the brain and surrounding structures were obtained without intravenous contrast. Angiographic images of the Circle of  Willis were obtained using MRA technique without intravenous contrast. Angiographic images of the neck were obtained using MRA technique without intravenous contrast. Carotid stenosis measurements (when applicable) are obtained utilizing NASCET criteria, using the distal internal carotid diameter as the denominator. COMPARISON:  None. FINDINGS: MRI HEAD Brain: There is no acute infarction or intracranial hemorrhage. There is no intracranial mass, mass effect, or edema. There is no hydrocephalus or extra-axial fluid collection. Prominence of the ventricles and sulci reflects generalized parenchymal volume loss. Patchy and confluent areas of T2 hyperintensity in the supratentorial white matter are nonspecific but probably reflect moderate chronic microvascular ischemic changes. Focus of susceptibility hypointensity in the left cerebellum is most compatible with chronic microhemorrhage. Vascular: Major vessel flow voids at the skull base are  preserved. Skull and upper cervical spine: Normal marrow signal is preserved. Sinuses/Orbits: Minor mucosal thickening.  Orbits are unremarkable. Other: Sella is unremarkable.  Mastoid air cells are clear. MRA HEAD Intracranial internal carotid arteries are patent. Middle and anterior cerebral arteries are patent. Intracranial vertebral arteries, basilar artery, posterior cerebral arteries are patent. Bilateral posterior communicating arteries are present. There is no significant stenosis or aneurysm identified. MRA NECK Common, internal, and external carotid arteries are patent. There is plaque at the left greater than right ICA origins without hemodynamically significant stenosis. Visualized codominant extracranial vertebral arteries are patent. IMPRESSION: No acute infarction, hemorrhage, or mass. Moderate chronic microvascular ischemic changes. No large vessel occlusion or hemodynamically significant stenosis. Electronically Signed   By: Macy Mis M.D.   On: 07/15/2020  18:19   MR BRAIN WO CONTRAST  Result Date: 07/15/2020 CLINICAL DATA:  Altered mental status, episode of right-sided paralysis and abnormal speech EXAM: MRI HEAD WITHOUT CONTRAST MRA HEAD WITHOUT CONTRAST MRA NECK WITHOUT CONTRAST TECHNIQUE: Multiplanar, multiecho pulse sequences of the brain and surrounding structures were obtained without intravenous contrast. Angiographic images of the Circle of Willis were obtained using MRA technique without intravenous contrast. Angiographic images of the neck were obtained using MRA technique without intravenous contrast. Carotid stenosis measurements (when applicable) are obtained utilizing NASCET criteria, using the distal internal carotid diameter as the denominator. COMPARISON:  None. FINDINGS: MRI HEAD Brain: There is no acute infarction or intracranial hemorrhage. There is no intracranial mass, mass effect, or edema. There is no hydrocephalus or extra-axial fluid collection. Prominence of the ventricles and sulci reflects generalized parenchymal volume loss. Patchy and confluent areas of T2 hyperintensity in the supratentorial white matter are nonspecific but probably reflect moderate chronic microvascular ischemic changes. Focus of susceptibility hypointensity in the left cerebellum is most compatible with chronic microhemorrhage. Vascular: Major vessel flow voids at the skull base are preserved. Skull and upper cervical spine: Normal marrow signal is preserved. Sinuses/Orbits: Minor mucosal thickening.  Orbits are unremarkable. Other: Sella is unremarkable.  Mastoid air cells are clear. MRA HEAD Intracranial internal carotid arteries are patent. Middle and anterior cerebral arteries are patent. Intracranial vertebral arteries, basilar artery, posterior cerebral arteries are patent. Bilateral posterior communicating arteries are present. There is no significant stenosis or aneurysm identified. MRA NECK Common, internal, and external carotid arteries are patent.  There is plaque at the left greater than right ICA origins without hemodynamically significant stenosis. Visualized codominant extracranial vertebral arteries are patent. IMPRESSION: No acute infarction, hemorrhage, or mass. Moderate chronic microvascular ischemic changes. No large vessel occlusion or hemodynamically significant stenosis. Electronically Signed   By: Macy Mis M.D.   On: 07/15/2020 18:19    Assessment & Plan by Problem: Active Problems:   * No active hospital problems. *   Ms. Zertuche is a 79 y.o. F with a PMH of orthostatic hypotension, COPD, Lung cancer, a. Fib, who presents for a 10 minute episode of AMS, weakness, slurred speech, and R facial droop and was admitted for stroke workup.   Transient Altered Mental Status Facial Droop Slurred Speech:   Patient presents with transient AMS with associated symptoms of facial droop, slurred speech, and weakness concerning for TIA. Patient has a significant history of tobacco use, a. Fib, HTN, and cancer concerning for embolic vs thrombolic etiologies. She also has a significant history of orthostatic hypotension on midodrine and fludrocortisone. Given the short period of time her symptoms were present, this could likely be a hyperperfusion event, which seems most  likely, but will continue stroke workup. -- Neurology consult, we appreciate their recommendations -- Echocardiogram  -- Carotid doppler or CTA head & neck  -- A1C  -- Lipid panel  -- Tele monitoring  -- SLP eval -- PT/OT -- Frequent neuro checks   A. Fib:  - Continue home Eliquis 2.5 mg twice daily  AKI:  Patient sCr increased to 1.37 from baseline of 1.0. Likely prerenal in etiology. Starting fluids  - Started on fluids - Trend BMP - U Na, U Cr   Dispo: Admit patient to Observation with expected length of stay less than 2 midnights.  Signed: Maudie Mercury, MD 07/15/2020, 3:50 PM  Pager: [phone] After 5pm on weekdays and 1pm on weekends: On Call  pager: [phone]

## 2020-07-15 NOTE — ED Triage Notes (Signed)
Reported episode of altered mental status onset of around 1030 am witnessed by family, "just not acting right" and quickly resolved. Denies recent injury, answers appropriately.

## 2020-07-15 NOTE — ED Notes (Signed)
Family expressing concern about High BP on this pt, Dr. Rebeca Alert notified. Per Dr. Rebeca Alert permissive HTN is recommended after a TIA and unless if pt is unstable will be treated.

## 2020-07-15 NOTE — ED Provider Notes (Signed)
[facility] EMERGENCY DEPARTMENT Provider Note   CSN: [id number] Arrival date & time: 07/15/20  1224     History Chief Complaint  Patient presents with  . Altered Mental Status    Bianca [name] is a 79 y.o. female with a history of tobacco abuse, COPD, hypertension, A. fib anticoagulated on Eliquis, CAD, and prior syncope who presents to the emergency department via EMS for evaluation of an episode of altered mental status.  Upon discussion with the patient she states she is not entirely sure why she is here, she states that her family thought she was confused, relates that currently her only complaint is a mild frontal headache which she has had in the past.  I called and spoke with patient's daughters via telephone-they relate that this morning when she first woke up she seemed to be doing okay but was not interested in getting out of bed, and then around 1030 today after coming back from the bathroom being cold take her medicine she seems to get confused, less responsive, and slumped to the right with inability to move the right side of her body, her speech was slurred.  She did not appear to completely lose consciousness.  She came back around and still had slurred speech which prompted 911 call.  Per family upon EMS arrival patient did still have slurred speech, I put them on speaker phone with the patient in the room and they state they feel her speech is back to normal right now.  She has had similar episodes of decreased level of consciousness, however the slurred speech was new.  She did take her mididrone this morning as well as a new medication florinef for the first time. Patient denies visual disturbance, focal numbness/weakness, speech abnormality, confusion, fever, chills, dizziness, chest pain, or dyspnea.   HPI     Past Medical History:  Diagnosis Date  . Anxiety   . Arthritis   . Asthma   . COPD (chronic obstructive pulmonary disease) ([place])   . HOH  (hard of hearing)   . Hypertension   . Wears dentures    top and partial bottom  . Wears glasses     Patient Active Problem List   Diagnosis Date Noted  . S/P right hip fracture 03/26/2020  . 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 Orthostatic hypotension   . Syncope and collapse   . Protein-calorie malnutrition, severe 02/14/2020  . Hip fracture ([place]) 02/11/2020  . Femoral neck fracture ([place]) 02/11/2020  . Orthostasis 06/19/2019  . AF (paroxysmal atrial fibrillation) ([place]) 03/16/2019  . Mitral regurgitation 03/16/2019  . Elevated troponin   . Delirium   . Atrial fibrillation with RVR ([place]) 01/21/2019  . Underweight due to inadequate caloric intake 12/06/2017  . Acute bronchitis with COPD ([place]) 03/29/2017  . Osteoporosis 07/02/2016  . Tobacco abuse 10/26/2013  . Breast cancer of upper-outer quadrant of left female breast ([place]) 03/06/2013  . COPD (chronic obstructive pulmonary disease) ([place]) 06/09/2012  . Adjustment disorder 11/25/2010  . CHRONIC KIDNEY DISEASE STAGE III (MODERATE) 11/13/2009  . Depression 05/04/2007  . Asthma, chronic, unspecified asthma severity, with acute exacerbation 05/03/2007  . HYPERTENSION, BENIGN ESSENTIAL 12/06/2006  . ALLERGIC  RHINITIS 12/06/2006    Past Surgical History:  Procedure Laterality Date  . APPENDECTOMY    . BREAST LUMPECTOMY WITH NEEDLE LOCALIZATION Right 04/21/2013   Procedure: BREAST LUMPECTOMY WITH NEEDLE LOCALIZATION;  Surgeon: Harl Bowie, MD;  Location: [facility];  Service: General;  Laterality: Right;  . BREAST SURGERY  1985   cyst rt br  . MASTECTOMY W/ SENTINEL NODE BIOPSY Left 04/21/2013   Procedure: MASTECTOMY WITH SENTINEL LYMPH NODE BIOPSY;  Surgeon: Harl Bowie, MD;  Location: [facility];  Service: General;  Laterality: Left;  . OTHER SURGICAL HISTORY     cysts removed from foot  . OTHER SURGICAL HISTORY     cysts removed from breast  . TOTAL HIP  ARTHROPLASTY Right 02/13/2020   Procedure: TOTAL HIP ARTHROPLASTY ANTERIOR APPROACH;  Surgeon: Mcarthur Rossetti, MD;  Location: [facility];  Service: Orthopedics;  Laterality: Right;  . TUBAL LIGATION       OB History   No obstetric history on file.     Family History  Problem Relation Age of Onset  . CAD Mother        possible small heart attack  . Thyroid disease Sister        she thinks hyperthyroidism    Social History   Tobacco Use  . Smoking status: Current Every Day Smoker    Packs/day: 0.25    Types: Cigarettes  . Smokeless tobacco: Never Used  Substance Use Topics  . Alcohol use: No  . Drug use: No    Home Medications Prior to Admission medications   Medication Sig Start Date End Date Taking? Authorizing Provider  acetaminophen (TYLENOL) 500 MG tablet Take 1,000 mg by mouth every 6 (six) hours as needed for headache (pain).    [provider]  ADVAIR DISKUS 100-50 MCG/DOSE AEPB INHALE 1 PUFF TWICE A DAY AS DIRECTED **RINSE MOUTH AFTER USE** 03/22/20   Trinna Post, PA-C  busPIRone (BUSPAR) 15 MG tablet Take 1 tablet (15 mg total) by mouth 2 (two) times daily. 02/19/20   Nita Sells, MD  Calcium Carb-Cholecalciferol (CALCIUM+D3 PO) Take 1 tablet by mouth daily.    [provider]  Cholecalciferol (VITAMIN D3) 50 MCG (2000 UT) TABS Take 2,000 Units by mouth daily.     [provider]  ELIQUIS 2.5 MG TABS tablet TAKE 1 TABLET BY MOUTH TWICE DAILY 06/03/20   Evans Lance, MD  ferrous gluconate (FERGON) 324 MG tablet Take 1 tablet (324 mg total) by mouth 2 (two) times daily with a meal. 02/19/20   Nita Sells, MD  fludrocortisone (FLORINEF) 0.1 MG tablet Take 1 tablet (0.1 mg total) by mouth 2 (two) times daily. Take at 7 am and 1 pm. 07/11/20   Evans Lance, MD  lactose free nutrition (BOOST) LIQD Take 237 mLs by mouth 2 (two) times daily between meals.    [provider]  midodrine (PROAMATINE) 5 MG tablet  Take 1/2 tablet for systolic blood pressure between 120 - 160.  Take 1 tablet for systolic blood pressure less than 120. 06/18/20   Evans Lance, MD  mirtazapine (REMERON) 15 MG tablet Take 1 tablet (15 mg total) by mouth at bedtime. 02/19/20   Nita Sells, MD  PROAIR HFA 108 [phone] Base) MCG/ACT inhaler INHALE 1-2 PUFFS INTO THE LUNGS EVERY 4 HOURS AS NEEDED 07/26/17   Lucille Passy, MD  pyridostigmine (MESTINON) 60 MG tablet Take 0.5 tablets (30 mg total) by mouth 2 (two) times daily. 05/07/20   Evans Lance, MD  UNABLE TO FIND Rx: [id number] Post Surgical Bras (Quantity: 6)\ Dx: 174.9; Left mastectomy 05/29/13   Coralie Keens, MD  vitamin C (ASCORBIC ACID) 500 MG tablet Take 500 mg by mouth daily.  [provider]    Allergies    Xanax [alprazolam]  Review of Systems   Review of Systems  Constitutional: Negative for chills and fever.  Respiratory: Negative for cough and shortness of breath.   Cardiovascular: Negative for chest pain.  Gastrointestinal: Negative for abdominal pain, nausea and vomiting.  Neurological: Positive for speech difficulty and headaches. Negative for weakness and numbness.  Psychiatric/Behavioral: Positive for confusion.  All other systems reviewed and are negative.   Physical Exam Updated Vital Signs BP (!) 204/99 (BP Location: Right Arm)   Pulse 93   Temp 98.3 F (36.8 C) (Oral)   Resp 18   LMP  (LMP Unknown)   SpO2 100%   Physical Exam Vitals and nursing note reviewed.  Constitutional:      General: She is not in acute distress.    Appearance: She is well-developed. She is not toxic-appearing.  HENT:     Head: Normocephalic and atraumatic.  Eyes:     General: Vision grossly intact. Gaze aligned appropriately.        Right eye: No discharge.        Left eye: No discharge.     Extraocular Movements: Extraocular movements intact.     Conjunctiva/sclera: Conjunctivae normal.     Comments: PERRL. No proptosis.    Cardiovascular:     Rate and Rhythm: Normal rate and regular rhythm.  Pulmonary:     Effort: Pulmonary effort is normal. No respiratory distress.     Breath sounds: Normal breath sounds. No wheezing, rhonchi or rales.  Abdominal:     General: There is no distension.     Palpations: Abdomen is soft.     Tenderness: There is no abdominal tenderness.  Musculoskeletal:     Cervical back: Normal range of motion and neck supple. No rigidity.  Skin:    General: Skin is warm and dry.     Findings: No rash.  Neurological:     Comments: Alert. Clear speech. No facial droop. CNIII-XII grossly intact. Bilateral upper and lower extremities' sensation grossly intact. 5/5 symmetric strength with grip strength and with plantar and dorsi flexion bilaterally .  Intact finger-to-nose.  Negative pronator drift.  Psychiatric:        Behavior: Behavior normal.    ED Results / Procedures / Treatments   Labs (all labs ordered are listed, but only abnormal results are displayed) Labs Reviewed  CBC WITH DIFFERENTIAL/PLATELET - Abnormal; Notable for the following components:      Result Value   Hemoglobin 10.8 (*)    HCT 34.4 (*)    MCH 25.4 (*)    RDW 18.6 (*)    All other components within normal limits  SARS CORONAVIRUS 2 (TAT 6-24 HRS)  PROTIME-INR  APTT  COMPREHENSIVE METABOLIC PANEL  ETHANOL  RAPID URINE DRUG SCREEN, HOSP PERFORMED  URINALYSIS, ROUTINE W REFLEX MICROSCOPIC  CBG MONITORING, ED  I-STAT CHEM 8, ED    EKG EKG Interpretation  Date/Time:  Monday July 15 2020 12:30:11 EST Ventricular Rate:  93 PR Interval:    QRS Duration: 98 QT Interval:  365 QTC Calculation: 301 R Axis:   33 Text Interpretation: Sinus rhythm RSR' in V1 or V2, probably normal variant Left ventricular hypertrophy ST depression, consider ischemia, lateral lds Confirmed by Dewaine Conger [id number]) on 07/15/2020 1:12:27 PM   Radiology CT Head Wo Contrast  Result Date: 07/15/2020 CLINICAL DATA:  Transient  ischemic attack (TIA) EXAM: CT HEAD WITHOUT CONTRAST TECHNIQUE: Contiguous axial images were obtained  from the base of the skull through the vertex without intravenous contrast. COMPARISON:  02/12/2020 and prior. FINDINGS: Brain: No acute infarct or intracranial hemorrhage. No mass lesion. No midline shift, ventriculomegaly or extra-axial fluid collection. Moderate cerebral atrophy with ex vacuo dilatation. Advanced chronic microvascular ischemic changes. Vascular: No hyperdense vessel or unexpected calcification. Skull base atherosclerotic calcifications. Skull: Negative for fracture or focal lesion. Sinuses/Orbits: Normal orbits. Clear paranasal sinuses. No mastoid effusion. Other: None. IMPRESSION: No acute intracranial process. Moderate cerebral atrophy and advanced chronic microvascular ischemic changes. Electronically Signed   By: Primitivo Gauze M.D.   On: 07/15/2020 13:19    Procedures Procedures (including critical care time)  Medications Ordered in ED Medications - No data to display  ED Course  I have reviewed the triage vital signs and the nursing notes.  Pertinent labs & imaging results that were available during my care of the patient were reviewed by me and considered in my medical decision making (see chart for details).    MDM Rules/Calculators/A&P                          Patient presents to the ED for evaluation of AMS now seeming to be back to baseline & no focal neuro deficits or slurred speech on exam therefore code stroke not activated. Notably hypertensive.   Additional history obtained:  Additional history obtained from chart review, nursing note review, & patient's family via telephone.   Lab Tests:  I Ordered, reviewed, and interpreted labs, which included:  CBG: WNL CBC: Anemia similar to prior.  CMP: Increased creatinine.  PT/INR/APTT: WNL  Imaging Studies ordered:  I ordered imaging studies which included CT head wo contrast, I independently visualized  and interpreted imaging which showed No acute intracranial process. Moderate cerebral atrophy and advanced chronic microvascular ischemic changes.   Given patient's transient altered mental status with slurred speech feel she warrants admission for TIA work-up.  14:39: CONSULT: Discussed with internal medicine residency program- accept admission, requesting neurology consult which will be placed.   14:59: CONSULT: Discussed with neurologist Dr. Cheral Marker- neurology team will see patient in consultation.   This is a shared visit with supervising physician Dr. Ron Parker who has independently evaluated patient & provided guidance in evaluation/management/disposition, in agreement with care   Patient's daughter updated on results & plan of care- in agreement.   Portions of this note were generated with [employer]. Dictation errors may occur despite best attempts at proofreading.  Final Clinical Impression(s) / ED Diagnoses Final diagnoses:  TIA (transient ischemic attack)    Rx / DC Orders ED Discharge Orders    None       Amaryllis Dyke, PA-C 07/15/20 1459    Breck Coons, MD 07/16/20 [phone]

## 2020-07-15 NOTE — ED Notes (Signed)
Family and pt very concern about getting medications that she usually don't get at home while she is on the hospital. Daughter requesting to be consulted of any medication given to the pt. Pt called out to be change due to external catheter leak, pt cleaned and changed on clean gown and bed changed. Pt is AO x 4, resting on bed comfortable.

## 2020-07-15 NOTE — Consult Note (Signed)
Neurology Consultation  Reason for Consult:  AMS, right-sided weakness, right facial droop for 5-10 min at home with resolution prior to hospital arrival  Referring Physician: S. Petrucelli   History is obtained from: patient, patient's daughter at bedside, chart review  HPI: Bianca [name] is a 79 y.o. female with a history of tobacco use (quit smoking September 2021), lung cancer s/p LLL lobectomy at [facility] 06/11/2020, surgery for fractured hip s/p fall in early September 2021, atrial fibrillation on Eliquis, COPD, hypertension, CAD, and prior syncope who presented today 12/27 via EMS after a witnessed episode at home of AMS, right-sided weakness, right facial droop, and slurred speech. Bianca [name] daughter explained that she assisted her mother to the restroom and back to the bedroom at 07:30 this morning without complications. Bianca [name] uses a walker for transportation but her daughter also assists her with her walker due to recent falls and hospitalizations. At 10:30 she assisted her mother back to the restroom when her mother experienced acute onset of AMS. Her daughter claims that her mother has "episodes" where she passes out frequently from position changes and has been put on midodrine for these syncope spells, but today was different in that her mother was able to respond when she became weak but in this event did not syncopize. Her daughter assisted her back to the bed and noticed that her mother was leaning towards the right with right-sided weakness, had right facial droop, subjective right facial and tongue numbness, slurred speech, unable to swallow her medications and aphasia. She called EMS immediately and claims that these symptoms persisted for about 5-10 minutes before resolution. Also of note, Bianca [name] was recently started on Florinef for orthostatic hypotension and her first dose was this morning 12/27 at 07:00.  LKW: 12/27 at 10:30 tPA given?: no, resolution of symptoms on arrival,  patient on Eliquis   ROS: A 14 point ROS was performed and is negative except as noted in the HPI.  Past Medical History:  Diagnosis Date  . Anxiety   . Arthritis   . Asthma   . COPD (chronic obstructive pulmonary disease) ([place])   . HOH (hard of hearing)   . Hypertension   . Wears dentures    top and partial bottom  . Wears glasses    Family History  Problem Relation Age of Onset  . CAD Mother        possible small heart attack  . Thyroid disease Sister        she thinks hyperthyroidism   Social History:   reports that she has been smoking cigarettes. She has been smoking about 0.25 packs per day. She has never used smokeless tobacco. She reports that she does not drink alcohol and does not use drugs.  Daughter reports that patient quit smoking cigarettes before her left lower lobectomy in September 2021  Medications Current Outpatient Medications  Medication Instructions  . acetaminophen (TYLENOL) 1,000 mg, Oral, Every 6 hours PRN  . ADVAIR DISKUS 100-50 MCG/DOSE AEPB INHALE 1 PUFF TWICE A DAY AS DIRECTED **RINSE MOUTH AFTER USE**  . busPIRone (BUSPAR) 15 mg, Oral, 2 times daily  . Calcium Carb-Cholecalciferol (CALCIUM+D3 PO) 1 tablet, Oral, Daily  . ELIQUIS 2.5 MG TABS tablet TAKE 1 TABLET BY MOUTH TWICE DAILY  . ferrous gluconate (FERGON) 324 mg, Oral, 2 times daily with meals  . fexofenadine (ALLEGRA) 180 mg, Oral, Daily  . fludrocortisone (FLORINEF) 0.1 mg, Oral, 2 times daily, Take at 7 am and 1 pm.  .  lactose free nutrition (BOOST) LIQD 237 mLs, Oral, 2 times daily between meals  . midodrine (PROAMATINE) 5 MG tablet Take 1/2 tablet for systolic blood pressure between 120 - 160.  Take 1 tablet for systolic blood pressure less than 120.  . mirtazapine (REMERON) 15 mg, Oral, Daily at bedtime  . PROAIR HFA 108 (90 Base) MCG/ACT inhaler INHALE 1-2 PUFFS INTO THE LUNGS EVERY 4 HOURS AS NEEDED  . pyridostigmine (MESTINON) 30 mg, Oral, 2 times daily  . UNABLE TO FIND Rx:  [id number]- Post Surgical Bras (Quantity: 6)\<BR>Dx: 174.9; Left mastectomy  . vitamin C (ASCORBIC ACID) 500 mg, Oral, Daily  . Vitamin D3 2,000 Units, Oral, Daily   Exam: Current vital signs: BP (!) 183/92   Pulse 90   Temp 98.3 F (36.8 C) (Oral)   Resp 18   LMP  (LMP Unknown)   SpO2 99%  Vital signs in last 24 hours: Temp:  [98.3 F (36.8 C)] 98.3 F (36.8 C) (12/27 1227) Pulse Rate:  [81-96] 90 (12/27 1500) Resp:  [11-24] 18 (12/27 1500) BP: (182-204)/(82-99) 183/92 (12/27 1500) SpO2:  [96 %-100 %] 99 % (12/27 1500)  GENERAL: Awake, alert, in no acute distress HEENT: - Normocephalic and atraumatic, dry mm, wears eyeglasses, hard of hearing- supposed to have hearing aids at baseline LUNGS - Normal respiratory effort CV - Regular rate on cardiac monitor, extremities warm and without edema ABDOMEN - Soft, non-distended, nontender Ext: warm, well perfused  NEURO:  Mental Status: alert and oriented to person, place, age, month. She is able to recall the events leading up to hospitalization but does have some amnesia to the events, recalls with coaching from her daughter at bedside.  Speech/Language: speech is clear without slurring.  Naming, repetition, fluency, and comprehension intact. Cranial Nerves:  II: PERRL 35mm/brisk. visual fields full.  III, IV, VI: EOMI. Lid elevation symmetric and full.  V: Sensation is intact and symmetrical to face. Blinks to threat. Moves jaw back and forth.  VII: Smile is symmetrical. Able to puff cheeks and raise eyebrows.  VIII: Hearing intact to loud voice- patient hard of hearing at baseline, supposed to use hearing aids but per daughter they stopped working recently  IX, X: Palate elevation is symmetric. Phonation normal.  XI: Normal sternocleidomastoid and trapezius muscle strength XII: Tongue is symmetrical without fasciculations.   Motor: 5/5 strength is all muscle groups.  Tone is normal. Bulk is normal.  Sensation- Intact to light touch  bilaterally in all four extremities. No extinction. Sensation to cool touch intact bilaterally but right > left lower extremity.   Coordination: FTN intact bilaterally. HKS intact bilaterally. No pronator drift.  DTRs: 2+ throughout.  Gait- deferred  1a Level of Conscious.: 0 1b LOC Questions: 0 1c LOC Commands: 0 2 Best Gaze: 0 3 Visual: 0 4 Facial Palsy: 0 5a Motor Arm - left: 0 5b Motor Arm - Right: 0 6a Motor Leg - Left: 0 6b Motor Leg - Right: 0 7 Limb Ataxia: 0 8 Sensory: 0 9 Best Language: 0 10 Dysarthria: 0 11 Extinct. and Inatten.: 0 TOTAL: 0  Labs I have reviewed labs in epic and the results pertinent to this consultation are: CBC    Component Value Date/Time   WBC 8.3 07/15/2020 1239   RBC 4.25 07/15/2020 1239   HGB 11.9 (L) 07/15/2020 1346   HGB 10.5 (L) 03/22/2020 1114   HGB 12.5 03/08/2013 1211   HCT 35.0 (L) 07/15/2020 1346   HCT 34.3 03/22/2020 1114  HCT 37.0 03/08/2013 1211   PLT 369 07/15/2020 1239   PLT 493 (H) 03/22/2020 1114   MCV 80.9 07/15/2020 1239   MCV 84 03/22/2020 1114   MCV 88.5 03/08/2013 1211   MCH 25.4 (L) 07/15/2020 1239   MCHC 31.4 07/15/2020 1239   RDW 18.6 (H) 07/15/2020 1239   RDW 15.1 03/22/2020 1114   RDW 14.0 03/08/2013 1211   LYMPHSABS 1.7 07/15/2020 1239   LYMPHSABS 2.2 03/22/2020 1114   LYMPHSABS 1.6 03/08/2013 1211   MONOABS 0.7 07/15/2020 1239   MONOABS 0.6 03/08/2013 1211   EOSABS 0.1 07/15/2020 1239   EOSABS 0.3 03/22/2020 1114   BASOSABS 0.0 07/15/2020 1239   BASOSABS 0.1 03/22/2020 1114   BASOSABS 0.1 03/08/2013 1211    CMP     Component Value Date/Time   NA 139 07/15/2020 1346   NA 141 03/22/2020 1114   NA 140 03/08/2013 1211   K 3.7 07/15/2020 1346   K 3.6 03/08/2013 1211   CL 109 07/15/2020 1346   CO2 22 07/15/2020 1230   CO2 26 03/08/2013 1211   GLUCOSE 93 07/15/2020 1346   GLUCOSE 84 03/08/2013 1211   BUN 17 07/15/2020 1346   BUN 23 03/22/2020 1114   BUN 15.9 03/08/2013 1211   CREATININE  1.30 (H) 07/15/2020 1346   CREATININE 1.5 (H) 03/08/2013 1211   CALCIUM 8.9 07/15/2020 1230   CALCIUM 9.5 03/08/2013 1211   PROT 6.6 07/15/2020 1230   PROT 6.4 03/22/2020 1114   PROT 6.9 03/08/2013 1211   ALBUMIN 3.4 (L) 07/15/2020 1230   ALBUMIN 3.9 03/22/2020 1114   ALBUMIN 3.7 03/08/2013 1211   AST 35 07/15/2020 1230   AST 19 03/08/2013 1211   ALT 8 07/15/2020 1230   ALT 16 03/08/2013 1211   ALKPHOS 69 07/15/2020 1230   ALKPHOS 65 03/08/2013 1211   BILITOT 1.1 07/15/2020 1230   BILITOT 0.3 03/22/2020 1114   BILITOT 0.38 03/08/2013 1211   GFRNONAA 39 (L) 07/15/2020 1230   GFRAA 58 (L) 03/22/2020 1114    Lipid Panel     Component Value Date/Time   CHOL 150 10/30/2019 1440   TRIG 85 10/30/2019 1440   HDL 48 10/30/2019 1440   CHOLHDL 3.1 10/30/2019 1440   CHOLHDL 2.9 01/22/2019 0431   VLDL 17 01/22/2019 0431   LDLCALC 86 10/30/2019 1440   LDLDIRECT 94.8 04/24/2010 1158   Imaging I have reviewed the images obtained:  CT-scan of the brain: No acute intracranial process. Moderate cerebral atrophy and advanced chronic microvascular ischemic changes.  MRI/MRA Pending  Impresssion: 79 year old female with transient AMS, right-sided weakness, right-facial droop and numbness, slurred speech for 5-10 minutes. Likely related to cerebral hypoperfusion without stroke due to orthostatic hypotension vs. cardioembolic stroke or TIA vs. Atherosclerosis with in situ thrombosis of a distal intracranial vessel.  1. Resolution of symptoms within 5-10 minutes with known orthostatic hypotension and frequent syncopal episodes etiology of transient symptoms likely related to cerebral hypoperfusion without stroke.  2. Patient with multiple stroke risk factors: history of tobacco use, CAD, hypertension, lung cancer and atrial fibrillation. Possible etiologies include hypoperfusion due to arrhythmia vs. cardioembolic vs. atherosclerosic cerebral hypoperfusion syndrome without stroke.  3.  Orthostatic hypotension: patient on midodrine and first dose Florinef today for orthostatic hypotension with syncopal episodes with standing- symptoms began today with standing from toilet. Etiology most likely due to hypoperfusion though further work-up is necessary. 4. History of atrial fibrillation on Eliquis and compliant with BID dosing   Recommendations: -  HgbA1c, fasting lipid panel - MRI of the brain without contrast - Frequent neuro checks - CTA of head and neck - Echocardiogram - Prophylactic therapy- continue home Eliquis 2.5mg  BID  - Risk factor modification - Telemetry monitoring - PT consult, OT consult, Speech consult - Stroke team to follow - IVF - Permissive HTN - Hold off on starting a statin due to her advanced age (benefits most likely outweighed by risks)   NP assisting with this consultation:  Anibal Henderson, AGAC-NP [facility] Pager: [phone]  I have seen and examined the patient. I have formulated the assessment and recommendations. 79 year old female presenting with probable TIA. Exam without lateralized deficit. Recommendations as above.  Electronically signed: Dr. Kerney Elbe

## 2020-07-15 NOTE — Telephone Encounter (Signed)
New message:     Patient daughter calling, patient daughter seems to think her mother may have had a stoke. please call patient daughter.

## 2020-07-16 ENCOUNTER — Observation Stay (HOSPITAL_COMMUNITY): Payer: PPO

## 2020-07-16 DIAGNOSIS — I4891 Unspecified atrial fibrillation: Secondary | ICD-10-CM | POA: Diagnosis not present

## 2020-07-16 DIAGNOSIS — R339 Retention of urine, unspecified: Secondary | ICD-10-CM | POA: Diagnosis present

## 2020-07-16 DIAGNOSIS — I951 Orthostatic hypotension: Secondary | ICD-10-CM | POA: Diagnosis present

## 2020-07-16 DIAGNOSIS — C3432 Malignant neoplasm of lower lobe, left bronchus or lung: Secondary | ICD-10-CM | POA: Diagnosis present

## 2020-07-16 DIAGNOSIS — G459 Transient cerebral ischemic attack, unspecified: Secondary | ICD-10-CM | POA: Diagnosis present

## 2020-07-16 DIAGNOSIS — R4781 Slurred speech: Secondary | ICD-10-CM | POA: Diagnosis present

## 2020-07-16 DIAGNOSIS — Z96641 Presence of right artificial hip joint: Secondary | ICD-10-CM | POA: Diagnosis present

## 2020-07-16 DIAGNOSIS — I10 Essential (primary) hypertension: Secondary | ICD-10-CM

## 2020-07-16 DIAGNOSIS — E7800 Pure hypercholesterolemia, unspecified: Secondary | ICD-10-CM | POA: Diagnosis not present

## 2020-07-16 DIAGNOSIS — N179 Acute kidney failure, unspecified: Secondary | ICD-10-CM | POA: Diagnosis present

## 2020-07-16 DIAGNOSIS — Z87891 Personal history of nicotine dependence: Secondary | ICD-10-CM

## 2020-07-16 DIAGNOSIS — R55 Syncope and collapse: Secondary | ICD-10-CM

## 2020-07-16 DIAGNOSIS — E785 Hyperlipidemia, unspecified: Secondary | ICD-10-CM | POA: Diagnosis present

## 2020-07-16 DIAGNOSIS — Z7901 Long term (current) use of anticoagulants: Secondary | ICD-10-CM | POA: Diagnosis not present

## 2020-07-16 DIAGNOSIS — Z681 Body mass index (BMI) 19 or less, adult: Secondary | ICD-10-CM | POA: Diagnosis not present

## 2020-07-16 DIAGNOSIS — E43 Unspecified severe protein-calorie malnutrition: Secondary | ICD-10-CM | POA: Diagnosis present

## 2020-07-16 DIAGNOSIS — Z20822 Contact with and (suspected) exposure to covid-19: Secondary | ICD-10-CM | POA: Diagnosis present

## 2020-07-16 DIAGNOSIS — I480 Paroxysmal atrial fibrillation: Secondary | ICD-10-CM | POA: Diagnosis present

## 2020-07-16 DIAGNOSIS — Z902 Acquired absence of lung [part of]: Secondary | ICD-10-CM | POA: Diagnosis not present

## 2020-07-16 DIAGNOSIS — G9349 Other encephalopathy: Secondary | ICD-10-CM | POA: Diagnosis present

## 2020-07-16 DIAGNOSIS — R29810 Facial weakness: Secondary | ICD-10-CM | POA: Diagnosis present

## 2020-07-16 DIAGNOSIS — I2510 Atherosclerotic heart disease of native coronary artery without angina pectoris: Secondary | ICD-10-CM | POA: Diagnosis present

## 2020-07-16 DIAGNOSIS — Z7951 Long term (current) use of inhaled steroids: Secondary | ICD-10-CM | POA: Diagnosis not present

## 2020-07-16 DIAGNOSIS — I129 Hypertensive chronic kidney disease with stage 1 through stage 4 chronic kidney disease, or unspecified chronic kidney disease: Secondary | ICD-10-CM | POA: Diagnosis present

## 2020-07-16 DIAGNOSIS — Z79899 Other long term (current) drug therapy: Secondary | ICD-10-CM | POA: Diagnosis not present

## 2020-07-16 DIAGNOSIS — N183 Chronic kidney disease, stage 3 unspecified: Secondary | ICD-10-CM | POA: Diagnosis present

## 2020-07-16 DIAGNOSIS — J449 Chronic obstructive pulmonary disease, unspecified: Secondary | ICD-10-CM | POA: Diagnosis present

## 2020-07-16 DIAGNOSIS — R54 Age-related physical debility: Secondary | ICD-10-CM | POA: Diagnosis present

## 2020-07-16 LAB — BASIC METABOLIC PANEL
Anion gap: 12 (ref 5–15)
BUN: 10 mg/dL (ref 8–23)
CO2: 24 mmol/L (ref 22–32)
Calcium: 9.4 mg/dL (ref 8.9–10.3)
Chloride: 105 mmol/L (ref 98–111)
Creatinine, Ser: 1.2 mg/dL — ABNORMAL HIGH (ref 0.44–1.00)
GFR, Estimated: 45 mL/min — ABNORMAL LOW (ref >60)
Glucose, Bld: 84 mg/dL (ref 70–99)
Potassium: 3.5 mmol/L (ref 3.5–5.1)
Sodium: 141 mmol/L (ref 135–145)

## 2020-07-16 LAB — CREATININE, URINE, RANDOM: Creatinine, Urine: 38.55 mg/dL

## 2020-07-16 LAB — CBC
HCT: 37.8 % (ref 36.0–46.0)
Hemoglobin: 12.1 g/dL (ref 12.0–15.0)
MCH: 25.8 pg — ABNORMAL LOW (ref 26.0–34.0)
MCHC: 32 g/dL (ref 30.0–36.0)
MCV: 80.6 fL (ref 80.0–100.0)
Platelets: 393 10*3/uL (ref 150–400)
RBC: 4.69 MIL/uL (ref 3.87–5.11)
RDW: 18.5 % — ABNORMAL HIGH (ref 11.5–15.5)
WBC: 9.5 10*3/uL (ref 4.0–10.5)
nRBC: 0 % (ref 0.0–0.2)

## 2020-07-16 LAB — ECHOCARDIOGRAM COMPLETE: S' Lateral: 2.6 cm

## 2020-07-16 LAB — SODIUM, URINE, RANDOM: Sodium, Ur: 109 mmol/L

## 2020-07-16 MED ORDER — ATORVASTATIN CALCIUM 10 MG PO TABS
20.0000 mg | ORAL_TABLET | Freq: Every day | ORAL | Status: DC
  Administered 2020-07-17 – 2020-07-21 (×5): 20 mg via ORAL
  Filled 2020-07-16 (×6): qty 2

## 2020-07-16 MED ORDER — STROKE: EARLY STAGES OF RECOVERY BOOK
Status: AC
  Administered 2020-07-16: 1
  Filled 2020-07-16: qty 1

## 2020-07-16 NOTE — Progress Notes (Signed)
  Echocardiogram 2D Echocardiogram has been performed.  Fidel Levy 07/16/2020, 9:19 AM

## 2020-07-16 NOTE — Plan of Care (Signed)
°  Problem: Education: Goal: Knowledge of disease or condition will improve Outcome: Progressing Goal: Knowledge of secondary prevention will improve Outcome: Progressing Goal: Knowledge of patient specific risk factors addressed and post discharge goals established will improve Outcome: Progressing Goal: Individualized Educational Video(s) Outcome: Progressing   Problem: Coping: Goal: Will verbalize positive feelings about self Outcome: Progressing Goal: Will identify appropriate support needs Outcome: Progressing   Problem: Health Behavior/Discharge Planning: Goal: Ability to manage health-related needs will improve Outcome: Progressing   Problem: Self-Care: Goal: Ability to participate in self-care as condition permits will improve Outcome: Progressing Goal: Verbalization of feelings and concerns over difficulty with self-care will improve Outcome: Progressing Goal: Ability to communicate needs accurately will improve Outcome: Progressing   Problem: Nutrition: Goal: Risk of aspiration will decrease Outcome: Progressing Goal: Dietary intake will improve Outcome: Progressing   Problem: Ischemic Stroke/TIA Tissue Perfusion: Goal: Complications of ischemic stroke/TIA will be minimized Outcome: Progressing   Problem: Education: Goal: Knowledge of General Education information will improve Description: Including pain rating scale, medication(s)/side effects and non-pharmacologic comfort measures Outcome: Progressing   Problem: Health Behavior/Discharge Planning: Goal: Ability to manage health-related needs will improve Outcome: Progressing   Problem: Clinical Measurements: Goal: Ability to maintain clinical measurements within normal limits will improve Outcome: Progressing Goal: Will remain free from infection Outcome: Progressing Goal: Diagnostic test results will improve Outcome: Progressing Goal: Respiratory complications will improve Outcome: Progressing Goal:  Cardiovascular complication will be avoided Outcome: Progressing   Problem: Activity: Goal: Risk for activity intolerance will decrease Outcome: Progressing   Problem: Nutrition: Goal: Adequate nutrition will be maintained Outcome: Progressing   Problem: Coping: Goal: Level of anxiety will decrease Outcome: Progressing   Problem: Elimination: Goal: Will not experience complications related to bowel motility Outcome: Progressing Goal: Will not experience complications related to urinary retention Outcome: Progressing   Problem: Pain Managment: Goal: General experience of comfort will improve Outcome: Progressing   Problem: Safety: Goal: Ability to remain free from injury will improve Outcome: Progressing   Problem: Skin Integrity: Goal: Risk for impaired skin integrity will decrease Outcome: Progressing

## 2020-07-16 NOTE — Telephone Encounter (Signed)
Called patient and no answer.[place], if patient calls back okay for [facility] to advised and schedule appointment for patient.

## 2020-07-16 NOTE — ED Notes (Signed)
Attempted report 

## 2020-07-16 NOTE — Evaluation (Signed)
Occupational Therapy Evaluation Patient Details Name: Bianca [name] MRN: [id number] DOB: 1940/09/01 Today's Date: 07/16/2020    History of Present Illness Pt is an 79 y/o female admitted secondary to AMS and weakness. MRI negative. PMH includes tobacco use, orthostatic hypotension, lung cancer s/p lobectomy, a fib, COPD, and HTN.   Clinical Impression   PTA patient was living with family and was requiring assist with ADLs/IADLs. Patient was ambulating in home and community dwellings with use of rollator. Patient currently functioning at [facility] grossly for self-care tasks with deficits in generalized strength, standing balance, cognition, and safety awareness. Patient would benefit from continued acute OT services in prep for safe d/c to next level of care with recommendation for SNF vs. HHOT pending patient progress. Family is able to provide 24hr supervision/assist.     Follow Up Recommendations  SNF;Home health OT;Supervision/Assistance - 24 hour    Equipment Recommendations  Tub/shower bench    Recommendations for Other Services       Precautions / Restrictions Precautions Precautions: Fall Precaution Comments: watch BP Restrictions Weight Bearing Restrictions: No      Mobility Bed Mobility Overal bed mobility: Needs Assistance Bed Mobility: Supine to Sit     Supine to sit: Min assist     General bed mobility comments: Patient able to advance BLE toward EOB with Min A +rail to elevate trunk. Cues for hand placement.    Transfers Overall transfer level: Needs assistance Equipment used: Rolling walker (2 wheeled) Transfers: Sit to/from Stand Sit to Stand: Min assist         General transfer comment: Min A for power negotiation and cues for hand placement. Patient able to complete anterior scoots toward EOB with close supervision A.    Balance Overall balance assessment: Needs assistance Sitting-balance support: No upper extremity supported;Feet  supported Sitting balance-Leahy Scale: Fair     Standing balance support: Bilateral upper extremity supported Standing balance-Leahy Scale: Poor Standing balance comment: Reliant on BUE support                           ADL either performed or assessed with clinical judgement   ADL Overall ADL's : Needs assistance/impaired Eating/Feeding: Set up;Sitting   Grooming: Supervision/safety;Sitting               Lower Body Dressing: Minimal assistance;Sit to/from stand Lower Body Dressing Details (indicate cue type and reason): Patient able to doff/don footwear seated in recliner. Would likely require Min A to don LB clothing in sitting/standing with use of DME. Toilet Transfer: Minimal Insurance claims handler Details (indicate cue type and reason): Simulated with stand-pivot transfer to recliner.         Functional mobility during ADLs: Minimal assistance General ADL Comments: Min A for short-distance functional mobiltiy with unilateral UE support and hand held assist.     Vision Baseline Vision/History: Wears glasses Wears Glasses: At all times Patient Visual Report: No change from baseline Vision Assessment?: No apparent visual deficits     Perception     Praxis      Pertinent Vitals/Pain Pain Assessment: 0-10 Pain Score: 0-No pain Pain Intervention(s): Monitored during session     Hand Dominance     Extremity/Trunk Assessment Upper Extremity Assessment Upper Extremity Assessment: Generalized weakness   Lower Extremity Assessment Lower Extremity Assessment: Generalized weakness   Cervical / Trunk Assessment Cervical / Trunk Assessment: Kyphotic   Communication Communication Communication: No difficulties   Cognition Arousal/Alertness: Lethargic  Behavior During Therapy: WFL for tasks assessed/performed Overall Cognitive Status: Impaired/Different from baseline Area of Impairment: Problem solving;Awareness;Following commands                        Following Commands: Follows one step commands inconsistently;Follows one step commands with increased time   Awareness: Emergent Problem Solving: Slow processing;Decreased initiation General Comments: Patient requires increased time to process verbal information.   General Comments  Patient's daughter present at bedside. BP 123/77.    Exercises     Shoulder Instructions      Home Living Family/patient expects to be discharged to:: Private residence Living Arrangements: Children Available Help at Discharge: Family;Available 24 hours/day Type of Home: House Home Access: Stairs to enter [employer] of Steps: 1 Entrance Stairs-Rails: None Home Layout: One level     Bathroom Shower/Tub: Teacher, early years/pre: Handicapped height     Home Equipment: Bedside commode;Walker - 4 wheels;Shower seat          Prior Functioning/Environment Level of Independence: Needs assistance  Gait / Transfers Assistance Needed: Family helps with stair management. Uses rollator for mobility most of the time ADL's / Homemaking Assistance Needed: Requires assist for ADLs and IADLs.            OT Problem List: Decreased strength;Impaired balance (sitting and/or standing);Decreased coordination;Decreased cognition;Decreased safety awareness      OT Treatment/Interventions: Self-care/ADL training;Therapeutic exercise;Energy conservation;DME and/or AE instruction;Therapeutic activities;Patient/family education;Balance training    OT Goals(Current goals can be found in the care plan section) Acute Rehab OT Goals Patient Stated Goal: To get stronger OT Goal Formulation: With patient Time For Goal Achievement: 07/30/20 Potential to Achieve Goals: Good ADL Goals Pt Will Perform Grooming: with supervision;standing Pt Will Perform Upper Body Dressing: sitting;with set-up Pt Will Perform Lower Body Dressing: with supervision;sit to/from stand Pt Will  Transfer to Toilet: with supervision;ambulating;grab bars Pt Will Perform Toileting - Clothing Manipulation and hygiene: with supervision;sit to/from stand Pt/caregiver will Perform Home Exercise Program: Increased strength;Both right and left upper extremity;With written HEP provided  OT Frequency: Min 2X/week   Barriers to D/C:            Co-evaluation              AM-PAC OT "6 Clicks" Daily Activity     Outcome Measure Help from another person eating meals?: A Little Help from another person taking care of personal grooming?: A Little Help from another person toileting, which includes using toliet, bedpan, or urinal?: A Lot Help from another person bathing (including washing, rinsing, drying)?: A Lot Help from another person to put on and taking off regular upper body clothing?: A Little Help from another person to put on and taking off regular lower body clothing?: A Little 6 Click Score: 16   End of Session Equipment Utilized During Treatment: Gait belt Nurse Communication: Mobility status  Activity Tolerance: Patient tolerated treatment well Patient left: in chair;with call bell/phone within reach;with chair alarm set;with family/visitor present  OT Visit Diagnosis: Unsteadiness on feet (R26.81);Muscle weakness (generalized) (M62.81);History of falling (Z91.81)                Time: 3149-7026 OT Time Calculation (min): 29 min Charges:  OT General Charges $OT Visit: 1 Visit OT Evaluation $OT Eval Moderate Complexity: 1 Mod OT Treatments $Self Care/Home Management : 8-22 mins  Malika Demario H. OTR/L Supplemental OT, Department of rehab services [phone]  Erandi Lemma R H. 07/16/2020, 3:13 PM

## 2020-07-16 NOTE — Progress Notes (Signed)
Received from ED and assisted into bed, oriented to room, educated on stroke.  Answered daughter's questions.

## 2020-07-16 NOTE — ED Notes (Signed)
Pt was siting on the edge of the bed when I arrived. Pt had a bowel movement. Cleaned pt up placed new purewick and put pt back in bed.

## 2020-07-16 NOTE — Progress Notes (Signed)
HD#0 Subjective:  Overnight Events: admitted overnight   Patient is resting in bed. She is alert/oriented to person and event leading up to hospitalization, but sleepy stating that she did not sleep well overnight. Otherwise, she denied additional symptoms today.   Objective:  Vital signs in last 24 hours: Vitals:   07/16/20 1030 07/16/20 1045 07/16/20 1100 07/16/20 1328  BP: (!) 184/99 (!) 168/99 140/83   Pulse: 100 96 (!) 106   Resp: 20  18   Temp:   98.2 F (36.8 C)   TempSrc:   Oral   SpO2: 100% 100% 98%   Weight:    45.4 kg  Height:    5\' 5"  (1.651 m)   Supplemental O2: Room Air SpO2: 98 %   Physical Exam:  Physical Exam Constitutional:      Comments: Sleepy and frail appearing female   HENT:     Head: Normocephalic and atraumatic.     Mouth/Throat:     Mouth: Mucous membranes are moist.     Pharynx: Oropharynx is clear.     Comments: No lesions noted Eyes:     Extraocular Movements: Extraocular movements intact.     Conjunctiva/sclera: Conjunctivae normal.     Pupils: Pupils are equal, round, and reactive to light.  Cardiovascular:     Rate and Rhythm: Normal rate and regular rhythm.     Pulses: Normal pulses.     Heart sounds: Normal heart sounds.  Pulmonary:     Effort: Pulmonary effort is normal.     Breath sounds: Normal breath sounds.  Abdominal:     General: Bowel sounds are normal.     Palpations: Abdomen is soft.     Tenderness: There is no abdominal tenderness.  Musculoskeletal:        General: Normal range of motion.     Cervical back: Normal range of motion. No rigidity.     Right lower leg: No edema.     Left lower leg: No edema.  Skin:    General: Skin is warm and dry.  Neurological:     Mental Status: She is alert and oriented to person, place, and time. Mental status is at baseline.     Sensory: No sensory deficit.     Motor: Weakness (RUE mildly weaker than left. Otherwise normal strength. ) present.  Psychiatric:        Mood  and Affect: Mood normal.     Filed Weights   07/16/20 1328  Weight: 45.4 kg    No intake or output data in the 24 hours ending 07/16/20 1400 Net IO Since Admission: No IO data has been entered for this period [07/16/20 1400]  Recent Labs    07/15/20 1229  GLUCAP 98     Pertinent Labs: CBC Latest Ref Rng & Units 07/16/2020 07/15/2020 07/15/2020  WBC 4.0 - 10.5 K/uL 9.5 - 8.3  Hemoglobin 12.0 - 15.0 g/dL 12.1 11.9(L) 10.8(L)  Hematocrit 36.0 - 46.0 % 37.8 35.0(L) 34.4(L)  Platelets 150 - 400 K/uL 393 - 369    CMP Latest Ref Rng & Units 07/16/2020 07/15/2020 07/15/2020  Glucose 70 - 99 mg/dL 84 93 101(H)  BUN 8 - 23 mg/dL 10 17 16   Creatinine 0.44 - 1.00 mg/dL 1.20(H) 1.30(H) 1.37(H)  Sodium 135 - 145 mmol/L 141 139 138  Potassium 3.5 - 5.1 mmol/L 3.5 3.7 4.8  Chloride 98 - 111 mmol/L 105 109 105  CO2 22 - 32 mmol/L 24 - 22  Calcium  8.9 - 10.3 mg/dL 9.4 - 8.9  Total Protein 6.5 - 8.1 g/dL - - 6.6  Total Bilirubin 0.3 - 1.2 mg/dL - - 1.1  Alkaline Phos 38 - 126 U/L - - 69  AST 15 - 41 U/L - - 35  ALT 0 - 44 U/L - - 8    Imaging:  Echocardiogram: 1. Left ventricular ejection fraction, by estimation, is 60 to 65%. The left ventricle has normal function. The left ventricle has no regional wall motion abnormalities. Indeterminate diastolic filling due to E-A fusion.  2. Right ventricular systolic function is normal. The right ventricular size is normal.  3. The mitral valve is normal in structure. No evidence of mitral valve regurgitation. No evidence of mitral stenosis.  4. The aortic valve is normal in structure. Aortic valve regurgitation is not visualized. Mild aortic valve sclerosis is present, with no evidence of aortic valve stenosis.  5. The inferior vena cava is normal in size with greater than 50% respiratory variability, suggesting right atrial pressure of 3 mmHg.   Assessment/Plan:   Active Problems:   Slurred speech   Patient Summary: Bianca [name] is a  79 y.o. with a pertinent PMH of orthostatic hypotension, high COPD, lung cancer, A. fib, who presented with acute focal/transient neurologic deficits and admitted for further evaluation and management of stroke versus TIA.    1. Acute transient encephalopathy secondary to TIA versus seizure Patient presented to [facility] with transient altered mental status with signs and symptoms concerning for cerebral infarct.  All imaging has been negative for acute stroke.  Although is difficult to rule out seizure, patient has not displayed any seizure-like activity since admission and there is no focal area on MRI that could be creating seizure-like activity.  Therefore, it is likely the patient experienced a transient ischemic attack.  Neurology has been consulted we appreciate their recommendations.  Echocardiogram does not show any signs of possible embolic etiology.  Stroke team elected to hold off on statin due to advanced age.  Furthermore patient was not started on dual antiplatelet due to currently taking Eliquis for her atrial fibrillation anticoagulation.  If patient develops additional transient neurologic symptoms, then we will get a EEG to further evaluate. -New Eliquis 2.5 mg twice daily -Continue telemetry -Continue PT/OT/speech -Continue permissive hypertension per neurology -We appreciate neurology's recommendations.   2. Atrial fibrillation Patient has been in sinus rhythm here in the hospital.  We will continue anticoagulation with Eliquis 2.5 mg twice daily  3. AKI Patient has mild improvement of her AKI from 1.37-1.2 this morning.  Likely has prerenal azotemia secondary to decreased p.o. intake.  Her urine sodium was elevated and she does have evidence of hemoglobin on her urinalysis which raises the concern for possible rhabdomyolysis.  Furthermore patient did have some leukocytes and white blood cells on urinalysis which could indicate a urinary tract infection.  However, she denies any  symptoms of dysuria at this time.  Furthermore, her AKI is mild and seems to be improving without significant intervention.  -We will continue to monitor her AKI closely. -Avoid nephrotoxic medications when possible.  Diet: Cardiac diet IVF: PO intake VTE: apixaban (ELIQUIS) tablet 2.5 mg Start: 07/16/20 1000 SCDs Start: 07/15/20 1552apixaban (ELIQUIS) tablet 2.5 mg  Code: Full PT/OT: Pending  Anticipated discharge to Rehab tomorrow pending PT evaluation and TOC placement.  Lawerance Cruel, D.O.  Internal Medicine Resident, PGY-2 [facility]  Pager: [phone] 2:00 PM, 07/16/2020  Please contact the on call pager after 5 pm and on weekends at [phone].

## 2020-07-16 NOTE — Evaluation (Signed)
Physical Therapy Evaluation Patient Details Name: Bianca [name] MRN: [id number] DOB: 1941/02/02 Today's Date: 07/16/2020   History of Present Illness  Pt is an 79 y/o female admitted secondary to AMS and weakness. MRI negative. PMH includes tobacco use, orthostatic hypotension, lung cancer s/p lobectomy, a fib, COPD, and HTN.  Clinical Impression  Pt admitted secondary to problem above with deficits below. Pt requiring min to mod A for bed mobility and min A to stand with RW. Pt with increased dizziness. Checked BP and BP at 80/52 mmHg. Pt returned to supine and pts BP at 124/92 mmHG. Feel pt would benefit from SNF level therapies at d/c. Will continue to follow acutely.     Follow Up Recommendations SNF;Supervision/Assistance - 24 hour    Equipment Recommendations  Wheelchair cushion (measurements PT);Wheelchair (measurements PT)    Recommendations for Other Services       Precautions / Restrictions Precautions Precautions: Fall Precaution Comments: watch BP Restrictions Weight Bearing Restrictions: No      Mobility  Bed Mobility Overal bed mobility: Needs Assistance Bed Mobility: Supine to Sit;Sit to Supine     Supine to sit: Mod assist Sit to supine: Min assist   General bed mobility comments: Mod A for trunk elevation to come to sitting. Min A for LE assist to return to supine.    Transfers Overall transfer level: Needs assistance Equipment used: Rolling walker (2 wheeled) Transfers: Sit to/from Stand Sit to Stand: Min assist         General transfer comment: Min A for lift assist and steadying to stand at EOB. Pt with dizziness and unable to take steps. Checked BP and BP at 80/52 mmHG. Returned to supine and BP at 124/92 mmHg.  Ambulation/Gait                Stairs            Wheelchair Mobility    Modified Rankin (Stroke Patients Only)       Balance Overall balance assessment: Needs assistance Sitting-balance support: No upper  extremity supported;Feet supported Sitting balance-Leahy Scale: Fair     Standing balance support: Bilateral upper extremity supported Standing balance-Leahy Scale: Poor Standing balance comment: Reliant on BUE support                             Pertinent Vitals/Pain Pain Assessment: Faces Faces Pain Scale: No hurt    Home Living Family/patient expects to be discharged to:: Private residence Living Arrangements: Children Available Help at Discharge: Family;Available 24 hours/day Type of Home: House Home Access: Stairs to enter Entrance Stairs-Rails: None Entrance Stairs-Number of Steps: 1 Home Layout: One level Home Equipment: Bedside commode;Walker - 4 wheels;Shower seat      Prior Function Level of Independence: Needs assistance   Gait / Transfers Assistance Needed: Family helps with stair management. Uses rollator for mobility most of the time  ADL's / Homemaking Assistance Needed: Requires assist for ADLs and IADLs.        Hand Dominance        Extremity/Trunk Assessment   Upper Extremity Assessment Upper Extremity Assessment: Defer to OT evaluation    Lower Extremity Assessment Lower Extremity Assessment: Generalized weakness    Cervical / Trunk Assessment Cervical / Trunk Assessment: Kyphotic  Communication   Communication: No difficulties  Cognition Arousal/Alertness: Lethargic Behavior During Therapy: WFL for tasks assessed/performed Overall Cognitive Status: Impaired/Different from baseline Area of Impairment: Problem solving;Awareness  Awareness: Emergent Problem Solving: Slow processing;Decreased initiation General Comments: Slow processing and very sleepy throughout. Easily arousable and agreeable to participate.      General Comments General comments (skin integrity, edema, etc.): Pt's daughter present during session. Educated about current deficits and SNF recommendations.    Exercises      Assessment/Plan    PT Assessment Patient needs continued PT services  PT Problem List Decreased strength;Decreased activity tolerance;Decreased balance;Decreased mobility;Decreased knowledge of use of DME;Decreased knowledge of precautions       PT Treatment Interventions Gait training;DME instruction;Functional mobility training;Therapeutic activities;Therapeutic exercise;Balance training;Patient/family education    PT Goals (Current goals can be found in the Care Plan section)  Acute Rehab PT Goals Patient Stated Goal: for pt to feel better PT Goal Formulation: With patient/family Time For Goal Achievement: 07/30/20 Potential to Achieve Goals: Good    Frequency Min 2X/week   Barriers to discharge        Co-evaluation               AM-PAC PT "6 Clicks" Mobility  Outcome Measure Help needed turning from your back to your side while in a flat bed without using bedrails?: A Little Help needed moving from lying on your back to sitting on the side of a flat bed without using bedrails?: A Lot Help needed moving to and from a bed to a chair (including a wheelchair)?: A Lot Help needed standing up from a chair using your arms (e.g., wheelchair or bedside chair)?: A Little Help needed to walk in hospital room?: A Lot Help needed climbing 3-5 steps with a railing? : Total 6 Click Score: 13    End of Session Equipment Utilized During Treatment: Gait belt Activity Tolerance: Treatment limited secondary to medical complications (Comment) (low BP) Patient left: in bed;with call bell/phone within reach;with nursing/sitter in room;with family/visitor present (on stretcher in ED) Nurse Communication: Mobility status PT Visit Diagnosis: History of falling (Z91.81);Muscle weakness (generalized) (M62.81)    Time: 2585-2778 PT Time Calculation (min) (ACUTE ONLY): 27 min   Charges:   PT Evaluation $PT Eval Moderate Complexity: 1 Mod PT Treatments $Therapeutic Activity: 8-22  mins        Lou Miner, DPT  Acute Rehabilitation Services  Pager: [phone] Office: [phone]   Rudean Hitt 07/16/2020, 10:48 AM

## 2020-07-16 NOTE — ED Notes (Signed)
Report given to Ty RN

## 2020-07-16 NOTE — Hospital Course (Addendum)
Bianca [name] is a 79 y.o. with a pertinent PMH of orthostatic hypotension, high COPD, lung cancer, A. fib, who presented with acute focal/transient neurologic deficits and admitted for further evaluation and management of TIA.    Acute transient encephalopathy Delirium Patient presented to [facility] with transient altered mental status with signs and symptoms concerning for cerebral infarct.  All imaging were negative for acute stroke.  Although is difficult to rule out seizure, patient did not displayed any seizure-like activity during admission and there is no focal area on MRI that could be creating seizure-like activity. Patient was thought to have transient ischemic attack which was likely caused her hypotensions. Neurology was consulted to evaluate patient and they recommended starting her on Lipitor 20 mg daily. Echocardiogram on 12/28 was normal and did not show any signs of possible embolic etiology. Patient's Eliquis was continued. On the 3rd night of her admission, she became delirius, requiring a low dose Haldo and a 1:1 sitter for safety. Her mental status continued to improve day by day until the day of discharge. PT/OT evaluated patient and recommended SNF placement.  Asymptomatic bacteriuria Urinary retention Patient's urinalysis showed small hemoglobin, large leukocytes with increased WBC on admission. Two days later, her urine culture grew >100,000 colonies/mL of Klebsiella pneumonia. Patient denied any burning or pain with urination, fever/chills, or stomach pain. She remained afebrile with normal WBC. In the setting of patient being asymptomatic, we held off on treatment. On 12/30, a bladder scan showed patient was retaining urine and an In and Out cath removed 900 cc of urine. She continued to retain urine (500 cc, 786 cc) on subsequent scans so a foley was placed the morning of 07/20/20. She will keep the foley in for 4 weeks to avoid further bladder stretch injury. She will need a  follow up with urology.   Orthostatic hypotension This is a chronic problem, which likely contributed to her presentation. We continued her home Florinef 0.1 mg twice and as needed midodrine. Fortunately, her blood pressure remained in the 130s-170s/80s-100s. During the admission. A TED hose was placed on her to help with BP support.   Age-related frailty Patient has had gradual decline in function status over the last few months. She has moderate malnutrition and chronic hypotension requiring Florinet and prn Midodrine. Patient will require 24 hr care due to declining functional status. Team discussed with family the importance goals of care discussions and expectations in her current frail state. Family plans to have conversation with patient and will follow up with PCP to get palliative care involvement.   Atrial fibrillation Patient was in sinus rhythm throughout her hospital stay. We continued anticoagulation with Eliquis 2.5 mg twice daily.    AKI Patient was found to have a creatinine of 1.37 on admission. Thought to be prerenal azotemia secondary to decreased p.o. intake. Patient was also found to have asymptomatic bacteriuria which likely contributed to her AKI. Her Aki resolved without an intervention. On discharge creatinine was 1.20.  HLD Chronic with a LDL of 99. Patient was started on Lipitor 20 mg daily.

## 2020-07-16 NOTE — Progress Notes (Signed)
STROKE TEAM PROGRESS NOTE   INTERVAL HISTORY Her daughter and RN are at the bedside.  Pt lying in bed, sleepy but easily arousable and cooperative on exam. She stated that she did not have good sleep at night and she denies any HA or pain elsewhere.MRI neg, MRA head and neck neg but seems to have slower flow on the left than on the right.   Vitals:   07/16/20 0945 07/16/20 1000 07/16/20 1030 07/16/20 1045  BP: (!) 162/87 (!) 144/106 (!) 184/99 (!) 168/99  Pulse: 88 (!) 104 100 96  Resp: 13 (!) 22 20   Temp:      TempSrc:      SpO2: 99% 94% 100% 100%   CBC:  Recent Labs  Lab 07/15/20 1239 07/15/20 1346 07/16/20 0516  WBC 8.3  --  9.5  NEUTROABS 5.8  --   --   HGB 10.8* 11.9* 12.1  HCT 34.4* 35.0* 37.8  MCV 80.9  --  80.6  PLT 369  --  102   Basic Metabolic Panel:  Recent Labs  Lab 07/15/20 1230 07/15/20 1346 07/16/20 0516  NA 138 139 141  K 4.8 3.7 3.5  CL 105 109 105  CO2 22  --  24  GLUCOSE 101* 93 84  BUN 16 17 10   CREATININE 1.37* 1.30* 1.20*  CALCIUM 8.9  --  9.4   Lipid Panel:  Recent Labs  Lab 07/15/20 1230  CHOL 167  TRIG 75  HDL 53  CHOLHDL 3.2  VLDL 15  LDLCALC 99   HgbA1c:  Recent Labs  Lab 07/15/20 1239  HGBA1C 5.2   Urine Drug Screen:  Recent Labs  Lab 07/15/20 1632  LABOPIA NONE DETECTED  COCAINSCRNUR NONE DETECTED  LABBENZ NONE DETECTED  AMPHETMU NONE DETECTED  THCU NONE DETECTED  LABBARB NONE DETECTED    Alcohol Level No results for input(s): ETH in the last 168 hours.  IMAGING past 24 hours CT Head Wo Contrast  Result Date: 07/15/2020 CLINICAL DATA:  Transient ischemic attack (TIA) EXAM: CT HEAD WITHOUT CONTRAST TECHNIQUE: Contiguous axial images were obtained from the base of the skull through the vertex without intravenous contrast. COMPARISON:  02/12/2020 and prior. FINDINGS: Brain: No acute infarct or intracranial hemorrhage. No mass lesion. No midline shift, ventriculomegaly or extra-axial fluid collection. Moderate  cerebral atrophy with ex vacuo dilatation. Advanced chronic microvascular ischemic changes. Vascular: No hyperdense vessel or unexpected calcification. Skull base atherosclerotic calcifications. Skull: Negative for fracture or focal lesion. Sinuses/Orbits: Normal orbits. Clear paranasal sinuses. No mastoid effusion. Other: None. IMPRESSION: No acute intracranial process. Moderate cerebral atrophy and advanced chronic microvascular ischemic changes. Electronically Signed   By: Primitivo Gauze M.D.   On: 07/15/2020 13:19   MR ANGIO HEAD WO CONTRAST  Result Date: 07/15/2020 CLINICAL DATA:  Altered mental status, episode of right-sided paralysis and abnormal speech EXAM: MRI HEAD WITHOUT CONTRAST MRA HEAD WITHOUT CONTRAST MRA NECK WITHOUT CONTRAST TECHNIQUE: Multiplanar, multiecho pulse sequences of the brain and surrounding structures were obtained without intravenous contrast. Angiographic images of the Circle of Willis were obtained using MRA technique without intravenous contrast. Angiographic images of the neck were obtained using MRA technique without intravenous contrast. Carotid stenosis measurements (when applicable) are obtained utilizing NASCET criteria, using the distal internal carotid diameter as the denominator. COMPARISON:  None. FINDINGS: MRI HEAD Brain: There is no acute infarction or intracranial hemorrhage. There is no intracranial mass, mass effect, or edema. There is no hydrocephalus or extra-axial fluid collection. Prominence of  the ventricles and sulci reflects generalized parenchymal volume loss. Patchy and confluent areas of T2 hyperintensity in the supratentorial white matter are nonspecific but probably reflect moderate chronic microvascular ischemic changes. Focus of susceptibility hypointensity in the left cerebellum is most compatible with chronic microhemorrhage. Vascular: Major vessel flow voids at the skull base are preserved. Skull and upper cervical spine: Normal marrow  signal is preserved. Sinuses/Orbits: Minor mucosal thickening.  Orbits are unremarkable. Other: Sella is unremarkable.  Mastoid air cells are clear. MRA HEAD Intracranial internal carotid arteries are patent. Middle and anterior cerebral arteries are patent. Intracranial vertebral arteries, basilar artery, posterior cerebral arteries are patent. Bilateral posterior communicating arteries are present. There is no significant stenosis or aneurysm identified. MRA NECK Common, internal, and external carotid arteries are patent. There is plaque at the left greater than right ICA origins without hemodynamically significant stenosis. Visualized codominant extracranial vertebral arteries are patent. IMPRESSION: No acute infarction, hemorrhage, or mass. Moderate chronic microvascular ischemic changes. No large vessel occlusion or hemodynamically significant stenosis. Electronically Signed   By: Macy Mis M.D.   On: 07/15/2020 18:19   MR ANGIO NECK WO CONTRAST  Result Date: 07/15/2020 CLINICAL DATA:  Altered mental status, episode of right-sided paralysis and abnormal speech EXAM: MRI HEAD WITHOUT CONTRAST MRA HEAD WITHOUT CONTRAST MRA NECK WITHOUT CONTRAST TECHNIQUE: Multiplanar, multiecho pulse sequences of the brain and surrounding structures were obtained without intravenous contrast. Angiographic images of the Circle of Willis were obtained using MRA technique without intravenous contrast. Angiographic images of the neck were obtained using MRA technique without intravenous contrast. Carotid stenosis measurements (when applicable) are obtained utilizing NASCET criteria, using the distal internal carotid diameter as the denominator. COMPARISON:  None. FINDINGS: MRI HEAD Brain: There is no acute infarction or intracranial hemorrhage. There is no intracranial mass, mass effect, or edema. There is no hydrocephalus or extra-axial fluid collection. Prominence of the ventricles and sulci reflects generalized  parenchymal volume loss. Patchy and confluent areas of T2 hyperintensity in the supratentorial white matter are nonspecific but probably reflect moderate chronic microvascular ischemic changes. Focus of susceptibility hypointensity in the left cerebellum is most compatible with chronic microhemorrhage. Vascular: Major vessel flow voids at the skull base are preserved. Skull and upper cervical spine: Normal marrow signal is preserved. Sinuses/Orbits: Minor mucosal thickening.  Orbits are unremarkable. Other: Sella is unremarkable.  Mastoid air cells are clear. MRA HEAD Intracranial internal carotid arteries are patent. Middle and anterior cerebral arteries are patent. Intracranial vertebral arteries, basilar artery, posterior cerebral arteries are patent. Bilateral posterior communicating arteries are present. There is no significant stenosis or aneurysm identified. MRA NECK Common, internal, and external carotid arteries are patent. There is plaque at the left greater than right ICA origins without hemodynamically significant stenosis. Visualized codominant extracranial vertebral arteries are patent. IMPRESSION: No acute infarction, hemorrhage, or mass. Moderate chronic microvascular ischemic changes. No large vessel occlusion or hemodynamically significant stenosis. Electronically Signed   By: Macy Mis M.D.   On: 07/15/2020 18:19   MR BRAIN WO CONTRAST  Result Date: 07/15/2020 CLINICAL DATA:  Altered mental status, episode of right-sided paralysis and abnormal speech EXAM: MRI HEAD WITHOUT CONTRAST MRA HEAD WITHOUT CONTRAST MRA NECK WITHOUT CONTRAST TECHNIQUE: Multiplanar, multiecho pulse sequences of the brain and surrounding structures were obtained without intravenous contrast. Angiographic images of the Circle of Willis were obtained using MRA technique without intravenous contrast. Angiographic images of the neck were obtained using MRA technique without intravenous contrast. Carotid stenosis  measurements (when applicable)  are obtained utilizing NASCET criteria, using the distal internal carotid diameter as the denominator. COMPARISON:  None. FINDINGS: MRI HEAD Brain: There is no acute infarction or intracranial hemorrhage. There is no intracranial mass, mass effect, or edema. There is no hydrocephalus or extra-axial fluid collection. Prominence of the ventricles and sulci reflects generalized parenchymal volume loss. Patchy and confluent areas of T2 hyperintensity in the supratentorial white matter are nonspecific but probably reflect moderate chronic microvascular ischemic changes. Focus of susceptibility hypointensity in the left cerebellum is most compatible with chronic microhemorrhage. Vascular: Major vessel flow voids at the skull base are preserved. Skull and upper cervical spine: Normal marrow signal is preserved. Sinuses/Orbits: Minor mucosal thickening.  Orbits are unremarkable. Other: Sella is unremarkable.  Mastoid air cells are clear. MRA HEAD Intracranial internal carotid arteries are patent. Middle and anterior cerebral arteries are patent. Intracranial vertebral arteries, basilar artery, posterior cerebral arteries are patent. Bilateral posterior communicating arteries are present. There is no significant stenosis or aneurysm identified. MRA NECK Common, internal, and external carotid arteries are patent. There is plaque at the left greater than right ICA origins without hemodynamically significant stenosis. Visualized codominant extracranial vertebral arteries are patent. IMPRESSION: No acute infarction, hemorrhage, or mass. Moderate chronic microvascular ischemic changes. No large vessel occlusion or hemodynamically significant stenosis. Electronically Signed   By: Macy Mis M.D.   On: 07/15/2020 18:19   ECHOCARDIOGRAM COMPLETE  Result Date: 07/16/2020    ECHOCARDIOGRAM REPORT   Patient Name:   Bianca [name] Date of Exam: 07/16/2020 Medical Rec #:  [id number]       Height:        65.0 in Accession #:    [id number]      Weight:       105.0 lb Date of Birth:  May 14, 1941        BSA:          1.504 m Patient Age:    27 years        BP:           189/69 mmHg Patient Gender: F               HR:           102 bpm. Exam Location:  Inpatient Procedure: 2D Echo, Cardiac Doppler and Color Doppler Indications:    TIA 435.9 / G45.9  History:        Patient has prior history of Echocardiogram examinations, most                 recent 01/22/2019. COPD; Risk Factors:Hypertension.  Sonographer:    Bernadene Person RDCS Referring Phys: [id number] ERIC C KATZ IMPRESSIONS  1. Left ventricular ejection fraction, by estimation, is 60 to 65%. The left ventricle has normal function. The left ventricle has no regional wall motion abnormalities. Indeterminate diastolic filling due to E-A fusion.  2. Right ventricular systolic function is normal. The right ventricular size is normal.  3. The mitral valve is normal in structure. No evidence of mitral valve regurgitation. No evidence of mitral stenosis.  4. The aortic valve is normal in structure. Aortic valve regurgitation is not visualized. Mild aortic valve sclerosis is present, with no evidence of aortic valve stenosis.  5. The inferior vena cava is normal in size with greater than 50% respiratory variability, suggesting right atrial pressure of 3 mmHg. FINDINGS  Left Ventricle: Left ventricular ejection fraction, by estimation, is 60 to 65%. The left ventricle has normal function. The left ventricle  has no regional wall motion abnormalities. The left ventricular internal cavity size was normal in size. There is  no left ventricular hypertrophy. Indeterminate diastolic filling due to E-A fusion. Right Ventricle: The right ventricular size is normal. No increase in right ventricular wall thickness. Right ventricular systolic function is normal. Left Atrium: Left atrial size was normal in size. Right Atrium: Right atrial size was normal in size. Pericardium: There is no  evidence of pericardial effusion. Mitral Valve: The mitral valve is normal in structure. No evidence of mitral valve regurgitation. No evidence of mitral valve stenosis. Tricuspid Valve: The tricuspid valve is normal in structure. Tricuspid valve regurgitation is not demonstrated. No evidence of tricuspid stenosis. Aortic Valve: The aortic valve is normal in structure. Aortic valve regurgitation is not visualized. Mild aortic valve sclerosis is present, with no evidence of aortic valve stenosis. Pulmonic Valve: The pulmonic valve was normal in structure. Pulmonic valve regurgitation is not visualized. No evidence of pulmonic stenosis. Aorta: The aortic root is normal in size and structure. Venous: The inferior vena cava is normal in size with greater than 50% respiratory variability, suggesting right atrial pressure of 3 mmHg. IAS/Shunts: No atrial level shunt detected by color flow Doppler.  LEFT VENTRICLE PLAX 2D LVIDd:         3.60 cm LVIDs:         2.60 cm LV PW:         1.00 cm LV IVS:        0.90 cm LVOT diam:     2.10 cm LV SV:         55 LV SV Index:   36 LVOT Area:     3.46 cm  RIGHT VENTRICLE RV S prime:     12.80 cm/s TAPSE (M-mode): 1.8 cm LEFT ATRIUM             Index       RIGHT ATRIUM          Index LA diam:        2.60 cm 1.73 cm/m  RA Area:     8.83 cm LA Vol (A2C):   22.3 ml 14.82 ml/m RA Volume:   12.20 ml 8.11 ml/m LA Vol (A4C):   23.4 ml 15.56 ml/m LA Biplane Vol: 25.0 ml 16.62 ml/m  AORTIC VALVE LVOT Vmax:   86.50 cm/s LVOT Vmean:  57.900 cm/s LVOT VTI:    0.158 m  AORTA Ao Root diam: 3.00 cm Ao Asc diam:  2.60 cm  SHUNTS Systemic VTI:  0.16 m Systemic Diam: 2.10 cm Mihai Croitoru MD Electronically signed by Sanda Klein MD Signature Date/Time: 07/16/2020/10:02:18 AM    Final     PHYSICAL EXAM  Temp:  [98 F (36.7 C)-98.3 F (36.8 C)] 98.2 F (36.8 C) (12/28 1100) Pulse Rate:  [81-115] 106 (12/28 1100) Resp:  [11-25] 18 (12/28 1100) BP: (137-218)/(69-106) 140/83 (12/28  1100) SpO2:  [90 %-100 %] 98 % (12/28 1100)  General - Well nourished, well developed, in no apparent distress, sleepy.  Ophthalmologic - fundi not visualized due to noncooperation.  Cardiovascular - Regular rhythm and rate.  Mental Status -  Level of arousal and orientation to time, place, and person were intact. Initially sleepy but wake up with stimulation and able to maintain wakefulness Language including expression, naming, repetition, comprehension was assessed and found intact. Fund of Knowledge was assessed and was intact.  Cranial Nerves II - XII - II - Visual field intact OU. III, IV, VI -  Extraocular movements intact. V - Facial sensation intact bilaterally. VII - Facial movement intact bilaterally. VIII - Hearing & vestibular intact bilaterally. X - Palate elevates symmetrically. XI - Chin turning & shoulder shrug intact bilaterally. XII - Tongue protrusion intact.  Motor Strength - The patient's strength was symmetrical in all extremities and pronator drift was absent.  Bulk was normal and fasciculations were absent.   Motor Tone - Muscle tone was assessed at the neck and appendages and was normal.  Reflexes - The patient's reflexes were symmetrical in all extremities and she had no pathological reflexes.  Sensory - Light touch, temperature/pinprick were assessed and were symmetrical.    Coordination - The patient had normal movements in the hands with no ataxia or dysmetria.  Tremor was absent.  Gait and Station - deferred.   ASSESSMENT/PLAN Bianca [name] is a 79 y.o. female with history of tobacco use (quit September 2021), lung cancer s/p LLL lobectomy at [facility] 06/11/2020, surgery for fractured hip s/p fall in early September 2021, atrial fibrillation on Eliquis, COPD, hypertension, CAD, and prior syncope  presenting with AMS, right-sided weakness, right facial droop, and slurred speech.   L brain TIA likely in the setting of pre-syncope secondary to  orthostatic hypotension   Code Stroke CT head No acute abnormality. Small vessel disease. Atrophy.   MRI  No acute infarct.   MRA head & neck No LVO or sign stenosis   2D Echo EF 60-65%. No source of embolus   LDL 99  HgbA1c 5.2  VTE prophylaxis - Eliquis  Eliquis (apixaban) daily 2.5 bid prior to admission, now on Eliquis (apixaban) daily 2.5 bid. Continue on discharge.  Therapy recommendations:  SNF  Disposition:  pending   Orthostatic hypotension Supine hypertension  Long standing issue  Follows with cardiology Dr. Lovena Le  On midodrine   Started Mestinon 02/2020 and OH improved  However, getting worse with hip fracture s/p sx and recent lobectomy 2/2 lung cancer  Put on florinef just prior to admission  Currently still has supine hypertension   Orthostatic vitals today x 2 -  Lying 124/92 and standing 80/52 - lying 169/88, sitting 158/88, standing 146/83  Recommend TED hose - ordered  Management per primary team  Atrial Fibrillation  Home anticoagulation:  Eliquis (apixaban) daily  . Continue Eliquis (apixaban) daily at discharge   Hyperlipidemia  Home meds:  No statin  LDL 99  Add lipitor 20  Continue statin at discharge  Tobacco abuse  Former smoker  quit Spet 2021  Smoking cessation counseling provided  Pt is willing to continue smoking cessation  Other Stroke Risk Factors  Advanced Age >/= 12   Coronary artery disease  Other Active Problems  lung cancer s/p LLL lobectomy at [facility] 06/11/2020  OR for fractured hip s/p fall in early September 2021  COPD  AKI Cre 1.37 -> 1.20  Hospital day # 0  Neurology will sign off. Please call with questions. Pt will follow up with stroke clinic NP at [facility] in about 4 weeks. Thanks for the consult.  Rosalin Hawking, MD PhD Stroke Neurology 07/16/2020 2:47 PM  To contact Stroke Continuity provider, please refer to [email or web address] After hours, contact General Neurology

## 2020-07-16 NOTE — Telephone Encounter (Signed)
If she is needing a referral, she will need an OV to discuss so I can document issues and make appropriate referral. Can be virtual, please schedule.

## 2020-07-16 NOTE — Progress Notes (Signed)
MEWS went from green to yellow d/t HR elevation, pt assessed, no change, charge nurse notified, MEWS protocol implemented

## 2020-07-17 ENCOUNTER — Ambulatory Visit: Payer: PPO | Admitting: Orthopaedic Surgery

## 2020-07-17 LAB — CBC
HCT: 33.4 % — ABNORMAL LOW (ref 36.0–46.0)
Hemoglobin: 10.5 g/dL — ABNORMAL LOW (ref 12.0–15.0)
MCH: 24.8 pg — ABNORMAL LOW (ref 26.0–34.0)
MCHC: 31.4 g/dL (ref 30.0–36.0)
MCV: 79 fL — ABNORMAL LOW (ref 80.0–100.0)
Platelets: 340 10*3/uL (ref 150–400)
RBC: 4.23 MIL/uL (ref 3.87–5.11)
RDW: 17.9 % — ABNORMAL HIGH (ref 11.5–15.5)
WBC: 8.3 10*3/uL (ref 4.0–10.5)
nRBC: 0 % (ref 0.0–0.2)

## 2020-07-17 LAB — BASIC METABOLIC PANEL
Anion gap: 12 (ref 5–15)
BUN: 20 mg/dL (ref 8–23)
CO2: 22 mmol/L (ref 22–32)
Calcium: 9 mg/dL (ref 8.9–10.3)
Chloride: 105 mmol/L (ref 98–111)
Creatinine, Ser: 1.29 mg/dL — ABNORMAL HIGH (ref 0.44–1.00)
GFR, Estimated: 42 mL/min — ABNORMAL LOW (ref >60)
Glucose, Bld: 90 mg/dL (ref 70–99)
Potassium: 3.4 mmol/L — ABNORMAL LOW (ref 3.5–5.1)
Sodium: 139 mmol/L (ref 135–145)

## 2020-07-17 MED ORDER — HALOPERIDOL LACTATE 5 MG/ML IJ SOLN
1.0000 mg | Freq: Once | INTRAMUSCULAR | Status: AC
  Administered 2020-07-17: 1 mg via INTRAVENOUS
  Filled 2020-07-17: qty 1

## 2020-07-17 MED ORDER — RAMELTEON 8 MG PO TABS
8.0000 mg | ORAL_TABLET | Freq: Every day | ORAL | Status: AC
  Administered 2020-07-17: 8 mg via ORAL
  Filled 2020-07-17: qty 1

## 2020-07-17 NOTE — Progress Notes (Signed)
Subjective:   Hospital day: 2  Overnight event: Patient became delirious overnight, requiring 1 mg IV Haldol and placement of one-to-one sitter.  This AM, patient was evaluated this morning with one-to-one sitter at bedside.  According to sitter, patient has been confused since last night.  Patient believed she was at church. However, patient was able to pass swallow screen this morning.  Patient was able to answer all questions, but denies being in pain.  Denies burning or pain with urination.  Patient was re-evaluated in the afternoon. She was more alert and able to tell me that she is at [facility]. She was updated on plans to get her to [facility] where she has had a good experience during her last stay.   Objective:  Vital signs in last 24 hours: Vitals:   07/16/20 2227 07/16/20 2342 07/17/20 0230 07/17/20 0349  BP: (!) 160/87 (!) 178/105 (!) 170/90 (!) 167/91  Pulse: (!) 101 97 97 (!) 103  Resp: 18 18 18 17   Temp: 98 F (36.7 C) 97.8 F (36.6 C) 98 F (36.7 C) 97.9 F (36.6 C)  TempSrc: Oral Oral Oral Oral  SpO2:  96% 97% 99%  Weight:      Height:        Physical Exam  General: Frail elderly woman laying in bed.  No acute distress. Head: Normocephalic. Atraumatic. CV: RRR. No murmurs, rubs, or gallops. No LE edema Pulmonary: Lungs CTAB. Normal effort. No wheezing or rales. Abdominal: Soft, nontender, nondistended. Normal bowel sounds. Extremities: Palpable pulses. Normal ROM. Skin: Warm and dry. No obvious rash or lesions. Neuro: Somnolent. Patient oriented to self, person and place but not time. Moves all extremities. Normal sensation.  No facial droop. No focal deficit. Psych: Normal mood and affect  Assessment/Plan: ILIYANA CONVEY is a 79 y.o. female with PMH of orthostatic hypotension, COPD, lung cancer, A. fib, who presented with acute focal/transient neurologic deficits and admitted for further evaluation and management of stroke versus TIA  Principal  Problem:   TIA (transient ischemic attack) Active Problems:   Orthostatic hypotension   Slurred speech  Acute transient encephalopathy Delirium Patient w/ hx of deliriums at night during past hospitalizations became delirious overnight requiring low-dose Haldol and 1:1 sitter. No evidence of seizure activity overnight. This morning, patient was still confused and somnolent on exam. Passed bedside swallow screen. Patient was re-evaluated in the afternoon and mental status was much more improved. Will continue to monitor closely. --Continue delirium precautions --Continue Eliquis 2.5 mg twice daily --Continue permissive hypertension --PT/OT recommending SNF/home health   --CSW sending insurance auth for [facility] --Will discontinue 1:1 sitter for now, low threshold to reorder. --Frequent neuro checks  Atrial fibrillation Patient continues to be in sinus rhythm with heart rate in the 70s to 90s.  On anticoagulation with Eliquis. --Continue Eliquis 2.5 mg twice daily  Orthostatic hypotension Chronic. Currently hypertensive to systolic in [id number]. On midodrine on at home and recently started on Florinef prior to admission. --Continue Florinef 0.1 mg twice daily --PRN midodrine 2.5 mg if sBP 120-160, 5 mg if <120 --Continue TED hose  AKI Resolving. Creatinine down to 1.29. We will continue to monitor closely. --Daily BMP  HLD Chronic. LDL of 99. Started on stated on Lipitor 20 mg daily. --Continue Lipitor 20 mg   Diet: Cardiac IVF:  VTE: Eliquis  CODE: Full Code  Prior to Admission Living Arrangement: Home Anticipated Discharge Location: Home Barriers to Discharge: Medical work up Dispo: Anticipated discharge  in approximately 1-2 day(s).   Signed: Lacinda Axon, MD 07/17/2020, 6:22 AM  Pager: [phone] Internal Medicine Teaching Service After 5pm on weekdays and 1pm on weekends: On Call pager: [phone]

## 2020-07-17 NOTE — Evaluation (Signed)
Clinical/Bedside Swallow Evaluation Patient Details  Name: Bianca [name] MRN: [id number] Date of Birth: 04/12/1941  Today's Date: 07/17/2020 Time: SLP Start Time (ACUTE ONLY): 0845 SLP Stop Time (ACUTE ONLY): 0905 SLP Time Calculation (min) (ACUTE ONLY): 20 min  Past Medical History:  Past Medical History:  Diagnosis Date  . Anxiety   . Arthritis   . Asthma   . COPD (chronic obstructive pulmonary disease) ([place])   . HOH (hard of hearing)   . Hypertension   . Wears dentures    top and partial bottom  . Wears glasses    Past Surgical History:  Past Surgical History:  Procedure Laterality Date  . APPENDECTOMY    . BREAST LUMPECTOMY WITH NEEDLE LOCALIZATION Right 04/21/2013   Procedure: BREAST LUMPECTOMY WITH NEEDLE LOCALIZATION;  Surgeon: Harl Bowie, MD;  Location: [place];  Service: General;  Laterality: Right;  . BREAST SURGERY  1985   cyst rt br  . MASTECTOMY W/ SENTINEL NODE BIOPSY Left 04/21/2013   Procedure: MASTECTOMY WITH SENTINEL LYMPH NODE BIOPSY;  Surgeon: Harl Bowie, MD;  Location: [place];  Service: General;  Laterality: Left;  . OTHER SURGICAL HISTORY     cysts removed from foot  . OTHER SURGICAL HISTORY     cysts removed from breast  . 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   Procedure: TOTAL HIP ARTHROPLASTY ANTERIOR APPROACH;  Surgeon: Mcarthur Rossetti, MD;  Location: [place];  Service: Orthopedics;  Laterality: Right;  . TUBAL LIGATION     HPI:  79 y.o. female with a PMH significant for COPD, Lung Cancer, orhtostatic hypotension, and A. Fib (on eliquis), who presents to the [facility] for slurred speech. Ms. Ehler daughter states that the patient was in her usual state of health this morning, when she began to have AMS, slurred speech with R facial droop,  slumped to the R, was unable to hold her drink, swallow her pills, and had a tremor. The event last approximately 10 minutes The patient does have a Hx of  slumping over, which has been attributed to her orthostatic hypotension. The patient has had this history of her tremors for several months. The patient did not fall over during the event, but does endorse a fall from this past Saturday, but did not hit her head. MRI head 07/15/20 negative for acute processes; CT chest pending; BSE generated to r/o dysphagia; pt passed Yale swallow screen on 07/15/20.  Assessment / Plan / Recommendation Clinical Impression  Pt exhibits a cognitive-based dysphagia characterized by min inattention with boluses, requiring min verbal/tactile cues during consumption to trigger swallow as pt would hold bolus in her mouth prior to swallowing; delay in the initiation of the swallow noted with all consistencies as well, which is characteristic of cognitive-based dysphagia.  No overt s/s of aspiration noted throughout limited assessment as pt refused increased volume of food/liquid. Pt's vocal quality good, but low intensity during BSE.  Volitional cough noted to be strong.  Recommend continue Regular consistency/thin liquid diet.  ST will s/o at this time as pt is tolerating current diet well, just has limited intake d/t cognition.  Thank you for this consult. SLP Visit Diagnosis: Dysphagia, unspecified (R13.10)    Aspiration Risk  Mild aspiration risk    Diet Recommendation   Regular/thin liquids  Medication Administration: Whole meds with puree    Other  Recommendations Oral Care Recommendations: Oral care BID   Follow up Recommendations 24 hour supervision/assistance  Frequency and Duration   Evaluation only         Prognosis Prognosis for Safe Diet Advancement: Good      Swallow Study   General Date of Onset: 07/15/20 HPI: 79 y.o. female with a PMH significant for COPD, Lung Cancer, orhtostatic hypotension, and A. Fib (on eliquis), who presents to the [facility] for slurred speech. Ms. Valentine daughter states that the patient was in her usual state of health  this morning, when she began to have AMS, slurred speech with R facial droop,  slumped to the R, was unable to hold her drink, swallow her pills, and had a tremor. The event last approximately 10 minutes The patient does have a Hx of slumping over, which has been attributed to her orthostatic hypotension. The patient has had this history of her tremors for several months. The patient did not fall over during the event, but does endorse a fall from this past Saturday, but did not hit her head. Type of Study: Bedside Swallow Evaluation Previous Swallow Assessment: Yale swallow screen; passed 07/15/20 Diet Prior to this Study: Regular;Thin liquids Temperature Spikes Noted: No Respiratory Status: Room air History of Recent Intubation: No Behavior/Cognition: Alert;Confused;Requires cueing Oral Cavity Assessment: Within Functional Limits Oral Care Completed by SLP: Yes (partial d/t pt min participation/refusal) Oral Cavity - Dentition: Dentures, top;Dentures, bottom Self-Feeding Abilities: Needs assist Patient Positioning: Upright in bed Baseline Vocal Quality: Low vocal intensity Volitional Cough: Strong Volitional Swallow: Unable to elicit    Oral/Motor/Sensory Function Overall Oral Motor/Sensory Function: Other (comment) (DTA; no drooling or facial hemiparesis noted)   Ice Chips Ice chips: Impaired Presentation: Spoon Oral Phase Functional Implications: Oral holding Pharyngeal Phase Impairments: Suspected delayed Swallow   Thin Liquid Thin Liquid: Impaired Presentation: Cup;Straw Pharyngeal  Phase Impairments: Suspected delayed Swallow    Nectar Thick Nectar Thick Liquid: Not tested   Honey Thick Honey Thick Liquid: Not tested   Puree Puree: Impaired Presentation: Spoon Oral Phase Functional Implications: Oral holding Pharyngeal Phase Impairments: Suspected delayed Swallow   Solid     Solid: Impaired Presentation: Spoon Pharyngeal Phase Impairments: Suspected delayed Swallow       Elvina Sidle, M.S., CCC-SLP 07/17/2020,11:15 AM

## 2020-07-17 NOTE — TOC Initial Note (Addendum)
Transition of Care [facility]) - Initial/Assessment Note    Patient Details  Name: Bianca [name] MRN: [id number] Date of Birth: [birth year]  [facility] CM/SW Contact:    Pollie Friar, RN Phone Number: 07/17/2020, 12:47 PM  Clinical Narrative:                 Pt is currently only oriented times 2. CM reached out to her daughters and went over the recommendations for SNF rehab. They were in agreement. They are interested in [facility] as pt has been there before. They are in agreement with having her faxed out in the [place] area also.  Daughters asked to speak with MD. Secure chat message sent to MD.  Pt currently has a sitter in the room. She will need to be sitter free for 24 hours prior to d/c to SNF. Pt has received both Pfizer covid vaccines. TOC following.  Expected Discharge Plan: Skilled Nursing Facility Barriers to Discharge: Continued Medical Work up   Patient Goals and CMS Choice   CMS Medicare.gov Compare Post Acute Care list provided to:: Patient Represenative (must comment) Choice offered to / list presented to : Adult Children  Expected Discharge Plan and Services Expected Discharge Plan: Skilled Nursing Facility In-house Referral: Clinical Social Work Discharge Planning Services: CM Consult Post Acute Care Choice: [place] Living arrangements for the past 2 months: [place]                                      Prior Living Arrangements/Services Living arrangements for the past 2 months: Single Family Home Lives with:: Adult Children Patient language and need for interpreter reviewed:: Yes Do you feel safe going back to the place where you live?: Yes      Need for Family Participation in Patient Care: Yes (Comment) Care giver support system in place?: Yes (comment)   Criminal Activity/Legal Involvement Pertinent to Current Situation/Hospitalization: No - Comment as needed  Activities of Daily Living Home  Assistive Devices/Equipment: Shower chair with back,Hearing aid,Eyeglasses,Dentures (specify type),Walker (specify type) (upper and lower dentures, walker with seat) ADL Screening (condition at time of admission) Patient's cognitive ability adequate to safely complete daily activities?: No Is the patient deaf or have difficulty hearing?: Yes Does the patient have difficulty seeing, even when wearing glasses/contacts?: Yes Does the patient have difficulty concentrating, remembering, or making decisions?: No Patient able to express need for assistance with ADLs?: Yes Does the patient have difficulty dressing or bathing?: No Independently performs ADLs?: Yes (appropriate for developmental age) Does the patient have difficulty walking or climbing stairs?: Yes Weakness of Legs: Both Weakness of Arms/Hands: Both  Permission Sought/Granted                  Emotional Assessment Appearance:: Appears stated age     Orientation: : Oriented to Self,Oriented to Situation   Psych Involvement: No (comment)  Admission diagnosis:  TIA (transient ischemic attack) [G45.9] Slurred speech [R47.81] Orthostatic hypotension [I95.1] Patient Active Problem List   Diagnosis Date Noted  . TIA (transient ischemic attack) 07/16/2020  . Slurred speech 07/15/2020  . S/P right hip fracture 03/26/2020  . 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 Orthostatic hypotension   . Syncope and collapse   . Protein-calorie malnutrition, severe 02/14/2020  . Hip fracture ([place]) 02/11/2020  . Femoral neck fracture ([place]) 02/11/2020  .  Orthostasis 06/19/2019  . AF (paroxysmal atrial fibrillation) ([place]) 03/16/2019  . Mitral regurgitation 03/16/2019  . Elevated troponin   . Delirium   . Atrial fibrillation with RVR ([place]) 01/21/2019  . Underweight due to inadequate caloric intake 12/06/2017  . Acute bronchitis with COPD ([place]) 03/29/2017  . Osteoporosis 07/02/2016  . Tobacco abuse 10/26/2013  . Breast  cancer of upper-outer quadrant of left female breast ([place]) 03/06/2013  . COPD (chronic obstructive pulmonary disease) ([place]) 06/09/2012  . Adjustment disorder 11/25/2010  . CHRONIC KIDNEY DISEASE STAGE III (MODER[place]) 11/13/2009  . Depression 05/04/2007  . Asthma, chronic, unspecified asthma severity, with acute exacerbation 05/03/2007  . HYPERTENSION, BENIGN ESSENTIAL 12/06/2006  . ALLERGIC  RHINITIS 12/06/2006   PCP:  Trinna Post, PA-C Pharmacy:   [place], [place] [facility] [place] [address] Phone: [phone] Fax: [phone]     Social Determinants of Health (SDOH) Interventions    Readmission Risk Interventions No flowsheet data found.

## 2020-07-17 NOTE — Progress Notes (Signed)
   07/17/20 1009  Clinical Encounter Type  Visited With Patient;Health care provider  Visit Type Initial  Consult/Referral To Chaplain  Spiritual Encounters  Spiritual Needs Literature  Chaplain responded to consult for Ms. Bartosiewicz.  When I arrived at the room Ms. Devonshire was sleeping and had the opportunity to talk with the NT. She stated Ms. Rutten will not be coherent to understand or read the AD paperwork and did not know who entered a consult for the [facility], so for now no AD has been given.   Chaplain Alynah Schone [place]  [phone]

## 2020-07-17 NOTE — NC FL2 (Signed)
[place] LEVEL OF CARE SCREENING TOOL     IDENTIFICATION  Patient Name: Bianca [name] Birthdate: [birth year] Sex: female Admission Date (Current Location): 07/15/2020  [facility] and [place] Number:  Engineering geologist and Address:  [facility]. [facility], [place] [address], [place], [place] [address]      Provider Number: [id number]  Attending Physician Name and Address:  Oda Kilts, MD  Relative Name and Phone Number:       Current Level of Care: Hospital Recommended Level of Care: [place] Prior Approval Number:    Date Approved/Denied:   PASRR Number: [id number]  Discharge Plan: SNF    Current Diagnoses: Patient Active Problem List   Diagnosis Date Noted  . TIA (transient ischemic attack) 07/16/2020  . Slurred speech 07/15/2020  . S/P right hip fracture 03/26/2020  . 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 Orthostatic hypotension   . Syncope and collapse   . Protein-calorie malnutrition, severe 02/14/2020  . Hip fracture ([place]) 02/11/2020  . Femoral neck fracture ([place]) 02/11/2020  . Orthostasis 06/19/2019  . AF (paroxysmal atrial fibrillation) ([place]) 03/16/2019  . Mitral regurgitation 03/16/2019  . Elevated troponin   . Delirium   . Atrial fibrillation with RVR ([place] ) 01/21/2019  . Underweight due to inadequate caloric intake 12/06/2017  . Acute bronchitis with COPD ([place]) 03/29/2017  . Osteoporosis 07/02/2016  . Tobacco abuse 10/26/2013  . Breast cancer of upper-outer quadrant of left female breast ([place]) 03/06/2013  . COPD (chronic obstructive pulmonary disease) ([place]) 06/09/2012  . Adjustment disorder 11/25/2010  . CHRONIC KIDNEY DISEASE STAGE III (MODERATE) 11/13/2009  . Depression 05/04/2007  . Asthma, chronic, unspecified asthma severity, with acute exacerbation 05/03/2007  . HYPERTENSION, BENIGN ESSENTIAL 12/06/2006  . ALLERGIC  RHINITIS 12/06/2006    Orientation RESPIRATION BLADDER  Height & Weight     Self,Situation  Normal Incontinent Weight: 45.4 kg Height:  5\' 5"  (165.1 cm)  BEHAVIORAL SYMPTOMS/MOOD NEUROLOGICAL BOWEL NUTRITION STATUS      Continent Diet (heart healthy with thin liquids)  AMBULATORY STATUS COMMUNICATION OF NEEDS Skin   Extensive Assist Verbally Normal                       Personal Care Assistance Level of Assistance  Bathing,Feeding,Dressing Bathing Assistance: Limited assistance Feeding assistance: Limited assistance Dressing Assistance: Limited assistance     Functional Limitations Info  Sight,Hearing,Speech Sight Info: Impaired Hearing Info: Adequate Speech Info: Adequate    SPECIAL CARE FACTORS FREQUENCY  PT (By licensed PT),OT (By licensed OT),Speech therapy     PT Frequency: 5x/wk OT Frequency: 5x/wk     Speech Therapy Frequency: 5x/wk      Contractures Contractures Info: Not present    Additional Factors Info  Code Status,Allergies Code Status Info: Full Allergies Info: alprazolam/ buspar/ mirtazapine/ sedation meds           Current Medications (07/17/2020):  This is the current hospital active medication list Current Facility-Administered Medications  Medication Dose Route Frequency Provider Last Rate Last Admin  . acetaminophen (TYLENOL) tablet 650 mg  650 mg Oral Q6H PRN Maudie Mercury, MD       Or  . acetaminophen (TYLENOL) suppository 650 mg  650 mg Rectal Q6H PRN Maudie Mercury, MD      . apixaban Arne Cleveland) tablet 2.5 mg  2.5 mg Oral BID Maudie Mercury, MD   2.5 mg at 07/17/20 1102  . atorvastatin (LIPITOR) tablet 20  mg  20 mg Oral Daily Rosalin Hawking, MD   20 mg at 07/17/20 1102  . fludrocortisone (FLORINEF) tablet 0.1 mg  0.1 mg Oral BID Maudie Mercury, MD   0.1 mg at 07/17/20 1102  . midodrine (PROAMATINE) tablet 2.5 mg  2.5 mg Oral Daily PRN Maudie Mercury, MD      . midodrine (PROAMATINE) tablet 5 mg  5 mg Oral Daily PRN Oda Kilts, MD   5 mg at 07/16/20 1611  . polyethylene glycol  (MIRALAX / GLYCOLAX) packet 17 g  17 g Oral Daily PRN Maudie Mercury, MD         Discharge Medications: Please see discharge summary for a list of discharge medications.  Relevant Imaging Results:  Relevant Lab Results:   Additional Information SS# [id number]  Pollie Friar, RN

## 2020-07-18 LAB — URINE CULTURE: Culture: >=100000 — AB

## 2020-07-18 LAB — BASIC METABOLIC PANEL
Anion gap: 10 (ref 5–15)
BUN: 21 mg/dL (ref 8–23)
CO2: 24 mmol/L (ref 22–32)
Calcium: 9.1 mg/dL (ref 8.9–10.3)
Chloride: 104 mmol/L (ref 98–111)
Creatinine, Ser: 1.2 mg/dL — ABNORMAL HIGH (ref 0.44–1.00)
GFR, Estimated: 45 mL/min — ABNORMAL LOW (ref >60)
Glucose, Bld: 95 mg/dL (ref 70–99)
Potassium: 3.5 mmol/L (ref 3.5–5.1)
Sodium: 138 mmol/L (ref 135–145)

## 2020-07-18 LAB — CBC
HCT: 36.1 % (ref 36.0–46.0)
Hemoglobin: 11.2 g/dL — ABNORMAL LOW (ref 12.0–15.0)
MCH: 24.7 pg — ABNORMAL LOW (ref 26.0–34.0)
MCHC: 31 g/dL (ref 30.0–36.0)
MCV: 79.7 fL — ABNORMAL LOW (ref 80.0–100.0)
Platelets: 339 10*3/uL (ref 150–400)
RBC: 4.53 MIL/uL (ref 3.87–5.11)
RDW: 17.8 % — ABNORMAL HIGH (ref 11.5–15.5)
WBC: 9.6 10*3/uL (ref 4.0–10.5)
nRBC: 0 % (ref 0.0–0.2)

## 2020-07-18 MED ORDER — PIPERACILLIN-TAZOBACTAM 3.375 G IVPB
3.3750 g | Freq: Three times a day (TID) | INTRAVENOUS | Status: DC
  Administered 2020-07-18: 3.375 g via INTRAVENOUS

## 2020-07-18 MED ORDER — RAMELTEON 8 MG PO TABS
8.0000 mg | ORAL_TABLET | Freq: Every day | ORAL | Status: AC
  Administered 2020-07-18: 8 mg via ORAL
  Filled 2020-07-18: qty 1

## 2020-07-18 NOTE — Discharge Summary (Signed)
Name: Bianca [name] MRN: [id number] DOB: 11/06/1940 79 y.o. PCP: Trinna Post, PA-C  Date of Admission: 07/15/2020 12:24 PM Date of Discharge: 07/21/2020 Attending Physician: Dr. Evette Doffing  Discharge Diagnosis: Principal Problem:   TIA (transient ischemic attack) Active Problems:   AF (paroxysmal atrial fibrillation) (HCC)   Protein-calorie malnutrition, severe   Orthostatic hypotension   Urinary retention    Discharge Medications: Allergies as of 07/21/2020      Reactions   Alprazolam Other (See Comments)   Combativeness and aggression   Buspar [buspirone] Other (See Comments)   Causes evident lethargy and is even more pronounced when taken in conjunction with Remeron   Mirtazapine Other (See Comments)   Causes evident lethargy    Other Other (See Comments)   Sedation- Causes agitation and aggression Changing patient's "usual" medication regimen usually causes "a lot of issues with her, physically"      Medication List    STOP taking these medications   busPIRone 15 MG tablet Commonly known as: BUSPAR   fexofenadine 180 MG tablet Commonly known as: ALLEGRA   fludrocortisone 0.1 MG tablet Commonly known as: FLORINEF   mirtazapine 15 MG tablet Commonly known as: Remeron   pyridostigmine 60 MG tablet Commonly known as: MESTINON   UNABLE TO FIND     TAKE these medications   acetaminophen 500 MG tablet Commonly known as: TYLENOL Take 1,000 mg by mouth every 6 (six) hours as needed (for headaches).   Advair Diskus 100-50 MCG/DOSE Aepb Generic drug: Fluticasone-Salmeterol INHALE 1 PUFF TWICE A DAY AS DIRECTED **RINSE MOUTH AFTER USE** What changed:   how much to take  how to take this  when to take this  additional instructions   atorvastatin 20 MG tablet Commonly known as: LIPITOR Take 1 tablet (20 mg total) by mouth daily. Start taking on: July 22, 2020   CALCIUM+D3 PO Take 1 tablet by mouth daily.   Eliquis 2.5 MG Tabs tablet Generic  drug: apixaban TAKE 1 TABLET BY MOUTH TWICE DAILY What changed: how much to take   ferrous gluconate 324 MG tablet Commonly known as: FERGON Take 1 tablet (324 mg total) by mouth 2 (two) times daily with a meal.   lactose free nutrition Liqd Take 237 mLs by mouth 2 (two) times daily between meals.   midodrine 5 MG tablet Commonly known as: PROAMATINE Take 1/2 tablet for systolic blood pressure between 120 - 160.  Take 1 tablet for systolic blood pressure less than 120. What changed:   how much to take  how to take this  when to take this  additional instructions   ProAir HFA 108 (90 Base) MCG/ACT inhaler Generic drug: albuterol INHALE 1-2 PUFFS INTO THE LUNGS EVERY 4 HOURS AS NEEDED What changed: See the new instructions.   vitamin C 500 MG tablet Commonly known as: ASCORBIC ACID Take 500 mg by mouth daily.   Vitamin D3 50 MCG (2000 UT) Tabs Take 2,000 Units by mouth daily.            Durable Medical Equipment  (From admission, onward)         Start     Ordered   07/21/20 1033  DME Tub Bench  Once        07/21/20 1042   07/21/20 1032  DME standard manual wheelchair with seat cushion  Once       Comments: Patient suffers from orthostatic hypotension which impairs their ability to perform daily activities like bathing and dressing  in the home.  A walker will not resolve issue with performing activities of daily living. A wheelchair will allow patient to safely perform daily activities. Patient can safely propel the wheelchair in the home or has a caregiver who can provide assistance. Length of need Lifetime. Accessories: elevating leg rests (ELRs), wheel locks, extensions and anti-tippers.   07/21/20 1042          Disposition and follow-up:   Bianca [name] was discharged from [facility] in Good condition.  At the hospital follow up visit please address:  1.  Follow-up:  A. Acute encephalopathy: Patient's symptoms resolved gradually so  it was thought to be due to a Transient Ischemic Attack. Please continue to assess mental status and monitor for signs of delirium. She will need to follow up with neurology in 4 weeks.     B. Orthostatic hypotension: Chronic problem. Her blood pressure was elevated throughout admission but was still orthostatic positive. Continue PT as tolerated.    C.Afib: She remained in normal sinus rhythm during hospitalization. Continue home Eliquis.    D. Asymptomatic bacteriuria/Urinary retention: Her urine culture grew Klebsiella species but was not symptomatic so she was not treated. She started retaining urine so a foley was placed on 1/1. She will need to keep the foley in for about 4 weeks and then follow up with urology.                               E. Age-related frailty: Patient and family will need to have goals of care discussion. Consider palliative care involvement to provide resources.   2.  Labs / imaging needed at time of follow-up: BMP  3.  Pending labs/ test needing follow-up: None  Follow-up Appointments:  Follow-up Information    [facility]. Schedule an appointment as soon as possible for a visit in 4 week(s).   Specialty: Neurology Contact information: [address] [place] [facility] Course by problem list: Bianca [name] is a 79 y.o. with a pertinent PMH of orthostatic hypotension, high COPD, lung cancer, A. fib, who presented with acute focal/transient neurologic deficits and admitted for further evaluation and management of TIA.    Acute transient encephalopathy Delirium Patient presented to [facility] with transient altered mental status with signs and symptoms concerning for cerebral infarct.  All imaging were negative for acute stroke.  Although is difficult to rule out seizure, patient did not displayed any seizure-like activity during admission and there is no focal area on MRI  that could be creating seizure-like activity. Patient was thought to have transient ischemic attack which was likely caused her hypotensions. Neurology was consulted to evaluate patient and they recommended starting her on Lipitor 20 mg daily. Echocardiogram on 12/28 was normal and did not show any signs of possible embolic etiology. Patient's Eliquis was continued. On the 3rd night of her admission, she became delirius, requiring a low dose Haldo and a 1:1 sitter for safety. Her mental status continued to improve day by day until the day of discharge. PT/OT evaluated patient and recommended SNF placement.  Asymptomatic bacteriuria Urinary retention Patient's urinalysis showed small hemoglobin, large leukocytes with increased WBC on admission. Two days later, her urine culture grew >100,000 colonies/mL of Klebsiella pneumonia. Patient denied any burning or pain with urination, fever/chills, or stomach  pain. She remained afebrile with normal WBC. In the setting of patient being asymptomatic, we held off on treatment. On 12/30, a bladder scan showed patient was retaining urine and an In and Out cath removed 900 cc of urine. She continued to retain urine (500 cc, 786 cc) on subsequent scans so a foley was placed the morning of 07/20/20. She will keep the foley in for 4 weeks to avoid further bladder stretch injury. She will need a follow up with urology.   Orthostatic hypotension This is a chronic problem, which likely contributed to her presentation. We continued her home Florinef 0.1 mg twice and as needed midodrine. Fortunately, her blood pressure remained in the 130s-170s/80s-100s. During the admission. A TED hose was placed on her to help with BP support.   Age-related frailty Patient has had gradual decline in function status over the last few months. She has moderate malnutrition and chronic hypotension requiring Florinet and prn Midodrine. Patient will require 24 hr care due to declining functional  status. Team discussed with family the importance goals of care discussions and expectations in her current frail state. Family plans to have conversation with patient and will follow up with PCP to get palliative care involvement.   Atrial fibrillation Patient was in sinus rhythm throughout her hospital stay. We continued anticoagulation with Eliquis 2.5 mg twice daily.    AKI Patient was found to have a creatinine of 1.37 on admission. Thought to be prerenal azotemia secondary to decreased p.o. intake. Patient was also found to have asymptomatic bacteriuria which likely contributed to her AKI. Her Aki resolved without an intervention. On discharge creatinine was 1.20.  HLD Chronic with a LDL of 99. Patient was started on Lipitor 20 mg daily.     Discharge Vitals:   BP (!) 147/77 (BP Location: Right Arm)   Pulse 80   Temp 98 F (36.7 C) (Oral)   Resp 18   Ht 5\' 5"  (1.651 m)   Wt 45.4 kg   LMP  (LMP Unknown)   SpO2 98%   BMI 16.66 kg/m   Pertinent Labs, Studies, and Procedures:  CBC Latest Ref Rng & Units 07/18/2020 07/17/2020 07/16/2020  WBC 4.0 - 10.5 K/uL 9.6 8.3 9.5  Hemoglobin 12.0 - 15.0 g/dL 11.2(L) 10.5(L) 12.1  Hematocrit 36.0 - 46.0 % 36.1 33.4(L) 37.8  Platelets 150 - 400 K/uL 339 340 393    CMP Latest Ref Rng & Units 07/18/2020 07/17/2020 07/16/2020  Glucose 70 - 99 mg/dL 95 90 84  BUN 8 - 23 mg/dL 21 20 10   Creatinine 0.44 - 1.00 mg/dL 1.20(H) 1.29(H) 1.20(H)  Sodium 135 - 145 mmol/L 138 139 141  Potassium 3.5 - 5.1 mmol/L 3.5 3.4(L) 3.5  Chloride 98 - 111 mmol/L 104 105 105  CO2 22 - 32 mmol/L 24 22 24   Calcium 8.9 - 10.3 mg/dL 9.1 9.0 9.4  Total Protein 6.5 - 8.1 g/dL - - -  Total Bilirubin 0.3 - 1.2 mg/dL - - -  Alkaline Phos 38 - 126 U/L - - -  AST 15 - 41 U/L - - -  ALT 0 - 44 U/L - - -    CT Head Wo Contrast  Result Date: 07/15/2020 CLINICAL DATA:  Transient ischemic attack (TIA) EXAM: CT HEAD WITHOUT CONTRAST TECHNIQUE: Contiguous axial images  were obtained from the base of the skull through the vertex without intravenous contrast. COMPARISON:  02/12/2020 and prior. FINDINGS: Brain: No acute infarct or intracranial hemorrhage. No mass lesion. No  midline shift, ventriculomegaly or extra-axial fluid collection. Moderate cerebral atrophy with ex vacuo dilatation. Advanced chronic microvascular ischemic changes. Vascular: No hyperdense vessel or unexpected calcification. Skull base atherosclerotic calcifications. Skull: Negative for fracture or focal lesion. Sinuses/Orbits: Normal orbits. Clear paranasal sinuses. No mastoid effusion. Other: None. IMPRESSION: No acute intracranial process. Moderate cerebral atrophy and advanced chronic microvascular ischemic changes. Electronically Signed   By: Primitivo Gauze M.D.   On: 07/15/2020 13:19   MR ANGIO HEAD WO CONTRAST  Result Date: 07/15/2020 CLINICAL DATA:  Altered mental status, episode of right-sided paralysis and abnormal speech EXAM: MRI HEAD WITHOUT CONTRAST MRA HEAD WITHOUT CONTRAST MRA NECK WITHOUT CONTRAST TECHNIQUE: Multiplanar, multiecho pulse sequences of the brain and surrounding structures were obtained without intravenous contrast. Angiographic images of the Circle of Willis were obtained using MRA technique without intravenous contrast. Angiographic images of the neck were obtained using MRA technique without intravenous contrast. Carotid stenosis measurements (when applicable) are obtained utilizing NASCET criteria, using the distal internal carotid diameter as the denominator. COMPARISON:  None. FINDINGS: MRI HEAD Brain: There is no acute infarction or intracranial hemorrhage. There is no intracranial mass, mass effect, or edema. There is no hydrocephalus or extra-axial fluid collection. Prominence of the ventricles and sulci reflects generalized parenchymal volume loss. Patchy and confluent areas of T2 hyperintensity in the supratentorial white matter are nonspecific but probably  reflect moderate chronic microvascular ischemic changes. Focus of susceptibility hypointensity in the left cerebellum is most compatible with chronic microhemorrhage. Vascular: Major vessel flow voids at the skull base are preserved. Skull and upper cervical spine: Normal marrow signal is preserved. Sinuses/Orbits: Minor mucosal thickening.  Orbits are unremarkable. Other: Sella is unremarkable.  Mastoid air cells are clear. MRA HEAD Intracranial internal carotid arteries are patent. Middle and anterior cerebral arteries are patent. Intracranial vertebral arteries, basilar artery, posterior cerebral arteries are patent. Bilateral posterior communicating arteries are present. There is no significant stenosis or aneurysm identified. MRA NECK Common, internal, and external carotid arteries are patent. There is plaque at the left greater than right ICA origins without hemodynamically significant stenosis. Visualized codominant extracranial vertebral arteries are patent. IMPRESSION: No acute infarction, hemorrhage, or mass. Moderate chronic microvascular ischemic changes. No large vessel occlusion or hemodynamically significant stenosis. Electronically Signed   By: Macy Mis M.D.   On: 07/15/2020 18:19   MR ANGIO NECK WO CONTRAST  Result Date: 07/15/2020 CLINICAL DATA:  Altered mental status, episode of right-sided paralysis and abnormal speech EXAM: MRI HEAD WITHOUT CONTRAST MRA HEAD WITHOUT CONTRAST MRA NECK WITHOUT CONTRAST TECHNIQUE: Multiplanar, multiecho pulse sequences of the brain and surrounding structures were obtained without intravenous contrast. Angiographic images of the Circle of Willis were obtained using MRA technique without intravenous contrast. Angiographic images of the neck were obtained using MRA technique without intravenous contrast. Carotid stenosis measurements (when applicable) are obtained utilizing NASCET criteria, using the distal internal carotid diameter as the denominator.  COMPARISON:  None. FINDINGS: MRI HEAD Brain: There is no acute infarction or intracranial hemorrhage. There is no intracranial mass, mass effect, or edema. There is no hydrocephalus or extra-axial fluid collection. Prominence of the ventricles and sulci reflects generalized parenchymal volume loss. Patchy and confluent areas of T2 hyperintensity in the supratentorial white matter are nonspecific but probably reflect moderate chronic microvascular ischemic changes. Focus of susceptibility hypointensity in the left cerebellum is most compatible with chronic microhemorrhage. Vascular: Major vessel flow voids at the skull base are preserved. Skull and upper cervical spine: Normal marrow signal is  preserved. Sinuses/Orbits: Minor mucosal thickening.  Orbits are unremarkable. Other: Sella is unremarkable.  Mastoid air cells are clear. MRA HEAD Intracranial internal carotid arteries are patent. Middle and anterior cerebral arteries are patent. Intracranial vertebral arteries, basilar artery, posterior cerebral arteries are patent. Bilateral posterior communicating arteries are present. There is no significant stenosis or aneurysm identified. MRA NECK Common, internal, and external carotid arteries are patent. There is plaque at the left greater than right ICA origins without hemodynamically significant stenosis. Visualized codominant extracranial vertebral arteries are patent. IMPRESSION: No acute infarction, hemorrhage, or mass. Moderate chronic microvascular ischemic changes. No large vessel occlusion or hemodynamically significant stenosis. Electronically Signed   By: Macy Mis M.D.   On: 07/15/2020 18:19   MR BRAIN WO CONTRAST  Result Date: 07/15/2020 CLINICAL DATA:  Altered mental status, episode of right-sided paralysis and abnormal speech EXAM: MRI HEAD WITHOUT CONTRAST MRA HEAD WITHOUT CONTRAST MRA NECK WITHOUT CONTRAST TECHNIQUE: Multiplanar, multiecho pulse sequences of the brain and surrounding  structures were obtained without intravenous contrast. Angiographic images of the Circle of Willis were obtained using MRA technique without intravenous contrast. Angiographic images of the neck were obtained using MRA technique without intravenous contrast. Carotid stenosis measurements (when applicable) are obtained utilizing NASCET criteria, using the distal internal carotid diameter as the denominator. COMPARISON:  None. FINDINGS: MRI HEAD Brain: There is no acute infarction or intracranial hemorrhage. There is no intracranial mass, mass effect, or edema. There is no hydrocephalus or extra-axial fluid collection. Prominence of the ventricles and sulci reflects generalized parenchymal volume loss. Patchy and confluent areas of T2 hyperintensity in the supratentorial white matter are nonspecific but probably reflect moderate chronic microvascular ischemic changes. Focus of susceptibility hypointensity in the left cerebellum is most compatible with chronic microhemorrhage. Vascular: Major vessel flow voids at the skull base are preserved. Skull and upper cervical spine: Normal marrow signal is preserved. Sinuses/Orbits: Minor mucosal thickening.  Orbits are unremarkable. Other: Sella is unremarkable.  Mastoid air cells are clear. MRA HEAD Intracranial internal carotid arteries are patent. Middle and anterior cerebral arteries are patent. Intracranial vertebral arteries, basilar artery, posterior cerebral arteries are patent. Bilateral posterior communicating arteries are present. There is no significant stenosis or aneurysm identified. MRA NECK Common, internal, and external carotid arteries are patent. There is plaque at the left greater than right ICA origins without hemodynamically significant stenosis. Visualized codominant extracranial vertebral arteries are patent. IMPRESSION: No acute infarction, hemorrhage, or mass. Moderate chronic microvascular ischemic changes. No large vessel occlusion or  hemodynamically significant stenosis. Electronically Signed   By: Macy Mis M.D.   On: 07/15/2020 18:19   ECHOCARDIOGRAM COMPLETE  Result Date: 07/16/2020    ECHOCARDIOGRAM REPORT   Patient Name:   Bianca [name] Date of Exam: 07/16/2020 Medical Rec #:  [id number]       Height:       65.0 in Accession #:    [id number]      Weight:       105.0 lb Date of Birth:  12/21/1940        BSA:          1.504 m Patient Age:    11 years        BP:           189/69 mmHg Patient Gender: F               HR:           102 bpm. Exam Location:  Inpatient Procedure:  2D Echo, Cardiac Doppler and Color Doppler Indications:    TIA 435.9 / G45.9  History:        Patient has prior history of Echocardiogram examinations, most                 recent 01/22/2019. COPD; Risk Factors:Hypertension.  Sonographer:    Bernadene Person RDCS Referring Phys: [id number] ERIC C KATZ IMPRESSIONS  1. Left ventricular ejection fraction, by estimation, is 60 to 65%. The left ventricle has normal function. The left ventricle has no regional wall motion abnormalities. Indeterminate diastolic filling due to E-A fusion.  2. Right ventricular systolic function is normal. The right ventricular size is normal.  3. The mitral valve is normal in structure. No evidence of mitral valve regurgitation. No evidence of mitral stenosis.  4. The aortic valve is normal in structure. Aortic valve regurgitation is not visualized. Mild aortic valve sclerosis is present, with no evidence of aortic valve stenosis.  5. The inferior vena cava is normal in size with greater than 50% respiratory variability, suggesting right atrial pressure of 3 mmHg. FINDINGS  Left Ventricle: Left ventricular ejection fraction, by estimation, is 60 to 65%. The left ventricle has normal function. The left ventricle has no regional wall motion abnormalities. The left ventricular internal cavity size was normal in size. There is  no left ventricular hypertrophy. Indeterminate diastolic filling due to  E-A fusion. Right Ventricle: The right ventricular size is normal. No increase in right ventricular wall thickness. Right ventricular systolic function is normal. Left Atrium: Left atrial size was normal in size. Right Atrium: Right atrial size was normal in size. Pericardium: There is no evidence of pericardial effusion. Mitral Valve: The mitral valve is normal in structure. No evidence of mitral valve regurgitation. No evidence of mitral valve stenosis. Tricuspid Valve: The tricuspid valve is normal in structure. Tricuspid valve regurgitation is not demonstrated. No evidence of tricuspid stenosis. Aortic Valve: The aortic valve is normal in structure. Aortic valve regurgitation is not visualized. Mild aortic valve sclerosis is present, with no evidence of aortic valve stenosis. Pulmonic Valve: The pulmonic valve was normal in structure. Pulmonic valve regurgitation is not visualized. No evidence of pulmonic stenosis. Aorta: The aortic root is normal in size and structure. Venous: The inferior vena cava is normal in size with greater than 50% respiratory variability, suggesting right atrial pressure of 3 mmHg. IAS/Shunts: No atrial level shunt detected by color flow Doppler.  LEFT VENTRICLE PLAX 2D LVIDd:         3.60 cm LVIDs:         2.60 cm LV PW:         1.00 cm LV IVS:        0.90 cm LVOT diam:     2.10 cm LV SV:         55 LV SV Index:   36 LVOT Area:     3.46 cm  RIGHT VENTRICLE RV S prime:     12.80 cm/s TAPSE (M-mode): 1.8 cm LEFT ATRIUM             Index       RIGHT ATRIUM          Index LA diam:        2.60 cm 1.73 cm/m  RA Area:     8.83 cm LA Vol (A2C):   22.3 ml 14.82 ml/m RA Volume:   12.20 ml 8.11 ml/m LA Vol (A4C):   23.4 ml 15.56 ml/m LA  Biplane Vol: 25.0 ml 16.62 ml/m  AORTIC VALVE LVOT Vmax:   86.50 cm/s LVOT Vmean:  57.900 cm/s LVOT VTI:    0.158 m  AORTA Ao Root diam: 3.00 cm Ao Asc diam:  2.60 cm  SHUNTS Systemic VTI:  0.16 m Systemic Diam: 2.10 cm Dani Gobble Croitoru MD Electronically  signed by Sanda Klein MD Signature Date/Time: 07/16/2020/10:02:18 AM    Final      Discharge Instructions: Discharge Instructions    Ambulatory referral to Neurology   Complete by: As directed    Follow up with stroke clinic NP (Jessica Vanschaick or Cecille Rubin, if both not available, consider Zachery Dauer, or A[facility]) at [facility] in about 4 weeks. Thanks.   Diet - low sodium heart healthy   Complete by: As directed    Increase activity slowly   Complete by: As directed       Signed: Maudie Mercury, MD 07/21/2020, 10:43 AM   Pager: [phone]

## 2020-07-18 NOTE — Progress Notes (Signed)
   Subjective:   Hospital day: 3  Overnight event: No acute events overnight  This morning, patient was evaluated at bedside laying comfortably in bed.  Patient denies any pain, fever/chills, shortness of breath or chest pain. Patient denies any burning or pain with urination.  Objective:  Vital signs in last 24 hours: Vitals:   07/17/20 1611 07/17/20 1950 07/17/20 2346 07/18/20 0459  BP: 131/78 (!) 170/96 (!) 139/54 (!) 175/83  Pulse: (!) 105 94 84 82  Resp: 16 17 16 16   Temp: 98.1 F (36.7 C) 97.9 F (36.6 C) 97.6 F (36.4 C) 97.8 F (36.6 C)  TempSrc: Oral Oral Oral Oral  SpO2: 95% 97% 96% 94%  Weight:      Height:        Physical Exam  General: Frail elderly woman laying comfortably in bed.  No acute distress. CV: RRR.  No M/R/G.  No lower extremity edema Pulmonary: Lungs CTAB.  Normal effort. Abdominal: Soft.  NT/ND.  Normal bowel sounds. Extremities: Palpable pulses. Neuro: Alert and oriented x4.  Moves all extremities.  Assessment/Plan: Bianca [name] is a 79 y.o. female with PMH of orthostatic hypotension, COPD, lung cancer, A. fib, who presented with acute focal/transient neurologic deficits and admitted for further evaluation and management of stroke versus TIA  Principal Problem:   TIA (transient ischemic attack) Active Problems:   CHRONIC KIDNEY DISEASE STAGE III (MODERATE)   Delirium   AF (paroxysmal atrial fibrillation) (HCC)   Orthostatic hypotension   Slurred speech  Acute transient encephalopathy Delirium No delirious episodes overnight. Patient's mental status much improved this morning. We will continue delirium precautions and monitor closely. In the setting of patient's deconditioned state, multiple recent hospitalization, [place] conversations with family needs to be initiated.  --Continue delirium precautions --Continue permissive hypertension --Pending SNF placement [facility]) --Will speak with family about GOC  Asymptomatic  bacteriuria Patient's urine culture grew >100,000 colonies/mL of Klebsiella pneumonia. Patient denies any burning or pain with urination, fever/chills, or stomach pain. She has remained afebrile with normal WBC. In the setting of patient being asymptomatic, will hold off on treatment for now. --Continue to monitor mental status  Atrial fibrillation Stable. In sinus rhythm.  --Continue Eliquis 2.5 mg BID  Orthostatic hypotension BP systolicBP in the [address].  --Continue Florinef 0.1 mg twice daily --Continue prn Midodrine --Continue TED hose  AKI, resolving Creatinine down to 1.20. Making appropriate urine output. --Daily BMP --Avoid nephrotoxic agents  HLD Chronic. LDL of 99.  --Continue lipitor 20 mg daily.  Diet: Cardiac IVF: None VTE: Eliquis  CODE: Full Code  Prior to Admission Living Arrangement: Home Anticipated Discharge Location: SNF Barriers to Discharge: Medical work up Dispo: Anticipated discharge in approximately 1-2 day(s).   Signed: Lacinda Axon, MD 07/18/2020, 5:51 AM  Pager: [phone] Internal Medicine Teaching Service After 5pm on weekdays and 1pm on weekends: On Call pager: [phone]

## 2020-07-18 NOTE — Progress Notes (Signed)
Physical Therapy Treatment Patient Details Name: Bianca [name] MRN: [id number] DOB: 06-Apr-1941 Today's Date: 07/18/2020    History of Present Illness Pt is an 79 y/o female admitted secondary to AMS and weakness. MRI negative. PMH includes tobacco use, orthostatic hypotension, lung cancer s/p lobectomy, a fib, COPD, and HTN.    PT Comments    Pt with positive orthostatic vitals see below.  She was asymptomatic, so we progressed with short distance gait to door of her room and back, sat and rested and repeated.  She was min guard assist overall for short distance mobility.  She normally uses a rollator, so needed some assist in turning which I would expect.  If family is available to provide 24/7 support any time she is on her feet she would be ok to return home with [facility] therapy follow up, but the BPs worry me, especially since she cannot feel that it is dropping.  I encouraged her that it would be safer to do rehab where she can be monitored closely by medical professionals.  She prefers to go home. Bring rollator next session and attempt hallway gait.    07/18/20 1651  Vital Signs  BP Location Right Arm  BP Method Automatic  Patient Position (if appropriate) Orthostatic Vitals  Orthostatic Lying   BP- Lying (!) 163/95  Pulse- Lying 88  Orthostatic Sitting  BP- Sitting 131/80  Pulse- Sitting 92  Orthostatic Standing at 0 minutes  BP- Standing at 0 minutes 102/66  Pulse- Standing at 0 minutes 98  Orthostatic Standing at 3 minutes  BP- Standing at 3 minutes 100/62  Pulse- Standing at 3 minutes 101  Oxygen Therapy  SpO2 98 %  O2 Device Room Air        Follow Up Recommendations  SNF     Equipment Recommendations  None recommended by PT    Recommendations for Other Services       Precautions / Restrictions Precautions Precautions: Fall Precaution Comments: watch BP, (+) orthostatics, but asymptomatic    Mobility  Bed Mobility Overal bed mobility: Needs  Assistance Bed Mobility: Supine to Sit     Supine to sit: Supervision     General bed mobility comments: supervision for safety, no reports of lightheadedness with transition to sit EOB  Transfers Overall transfer level: Needs assistance Equipment used: Rolling walker (2 wheeled) Transfers: Sit to/from Stand Sit to Stand: Min guard         General transfer comment: Attempted to let pt try to stand on her own, pulled on RW and tipped backward, min guard to finish powering up and stabilize  Ambulation/Gait Ambulation/Gait assistance: Min guard Gait Distance (Feet): 20 Feet (x2, <2 min seated rest between bouts) Assistive device: Rolling walker (2 wheeled) (normally uses rollator, will bring rollator next session.) Gait Pattern/deviations: Trunk flexed Gait velocity: decreased Gait velocity interpretation: 1.31 - 2.62 ft/sec, indicative of limited community ambulator General Gait Details: pt pushing RW too far away from her, but likely because she is used to using a rollator.   Stairs             Wheelchair Mobility    Modified Rankin (Stroke Patients Only) Modified Rankin (Stroke Patients Only) Pre-Morbid Rankin Score: Moderate disability Modified Rankin: Moderately severe disability     Balance Overall balance assessment: Needs assistance Sitting-balance support: Feet supported;No upper extremity supported Sitting balance-Leahy Scale: Good     Standing balance support: Bilateral upper extremity supported Standing balance-Leahy Scale: Poor  Cognition Arousal/Alertness: Awake/alert Behavior During Therapy: WFL for tasks assessed/performed Overall Cognitive Status: No family/caregiver present to determine baseline cognitive functioning                                 General Comments: Pt is very HOH, which I think impairs her understanding frequently.  It makes her appear to be slow to process, difficulty  with command following, but she does well with repetition, slower and lower tone      Exercises General Exercises - Upper Extremity Shoulder Flexion: AROM;Both;10 reps Elbow Flexion: AROM;Both;10 reps General Exercises - Lower Extremity Long Arc Quad: AROM;Both;10 reps Hip Flexion/Marching: AROM;Both;10 reps Toe Raises: AROM;Both;20 reps Heel Raises: AROM;Both;20 reps    General Comments        Pertinent Vitals/Pain Pain Assessment: No/denies pain Pain Score: 0-No pain    Home Living                      Prior Function            PT Goals (current goals can now be found in the care plan section) Acute Rehab PT Goals Patient Stated Goal: To get stronger Progress towards PT goals: Progressing toward goals    Frequency    Min 2X/week      PT Plan Current plan remains appropriate    Co-evaluation              AM-PAC PT "6 Clicks" Mobility   Outcome Measure  Help needed turning from your back to your side while in a flat bed without using bedrails?: None Help needed moving from lying on your back to sitting on the side of a flat bed without using bedrails?: None Help needed moving to and from a bed to a chair (including a wheelchair)?: A Little Help needed standing up from a chair using your arms (e.g., wheelchair or bedside chair)?: A Little Help needed to walk in hospital room?: A Little Help needed climbing 3-5 steps with a railing? : A Little 6 Click Score: 20    End of Session   Activity Tolerance: Patient limited by fatigue Patient left: in chair;with call bell/phone within reach;with chair alarm set   PT Visit Diagnosis: History of falling (Z91.81);Muscle weakness (generalized) (M62.81)     Time: 6659-9357 PT Time Calculation (min) (ACUTE ONLY): 21 min  Charges:  $Gait Training: 8-22 mins                     Verdene Lennert, PT, DPT  Acute Rehabilitation [phone] pager [phone]) [phone] office

## 2020-07-18 NOTE — TOC Progression Note (Signed)
Transition of Care [facility]) - Progression Note    Patient Details  Name: Bianca [name] MRN: [id number] Date of Birth: 1940-10-19  Transition of Care [facility]) CM/SW Contact  Pollie Friar, RN Phone Number: 07/18/2020, 4:25 PM  Clinical Narrative:    CM has provided the daughters the bed offers for the patient and they are not happy with the choices. CM has sent message to [facility] to see if they can offer. They would like [facility] as pt has been to the rehab there before but they wont have any beds for well over a week per admissions person.  TOC following.   Expected Discharge Plan: [place] Barriers to Discharge: Continued Medical Work up  Expected Discharge Plan and Services Expected Discharge Plan: [place] In-house Referral: Clinical Social Work Discharge Planning Services: CM Consult Post Acute Care Choice: [place] arrangements for the past 2 months: Single Family Home                                       Social Determinants of Health (SDOH) Interventions    Readmission Risk Interventions No flowsheet data found.

## 2020-07-19 LAB — SARS CORONAVIRUS 2 (TAT 6-24 HRS): SARS Coronavirus 2: NEGATIVE

## 2020-07-19 MED ORDER — RAMELTEON 8 MG PO TABS
8.0000 mg | ORAL_TABLET | Freq: Every day | ORAL | Status: AC
  Administered 2020-07-19: 8 mg via ORAL
  Filled 2020-07-19: qty 1

## 2020-07-19 NOTE — TOC Progression Note (Signed)
Transition of Care [facility]) - Progression Note    Patient Details  Name: Bianca [name] MRN: [id number] Date of Birth: March 20, 1941  Transition of Care [facility]) CM/SW Contact  Coralee P[place], [place] Phone Number: 07/19/2020, 5:38 PM  Clinical Narrative:    Josem Kaufmann Received SNF # [phone] Ambulance # [phone]   Expected Discharge Plan: [place] Barriers to Discharge: Continued Medical Work up  Expected Discharge Plan and Services Expected Discharge Plan: [place] In-house Referral: Clinical Social Work Discharge Planning Services: CM Consult Post Acute Care Choice: [place] arrangements for the past 2 months: Single Family Home                                       Social Determinants of Health (SDOH) Interventions    Readmission Risk Interventions No flowsheet data found.

## 2020-07-19 NOTE — Progress Notes (Addendum)
Subjective:   Hospital day: 4  Overnight event: Overnight, patient had a urinary retention.  Bladder scan showed 913 cc of urine.  In and out cath removed 1.1 L of urine.  This morning, patient was evaluated at the bedside laying comfortably in bed.  Patient states she was doing okay.  She denied any pain or burning with urination.  She denied any shortness of breath, chest pain or abdominal pain.  States she was hungry and was planning on eating her breakfast soon.  Objective:  Vital signs in last 24 hours: Vitals:   07/18/20 1710 07/18/20 2007 07/18/20 2331 07/19/20 0327  BP: (!) 154/91 (!) 186/89 (!) 157/83 (!) 169/79  Pulse: (!) 101 92 86 85  Resp: 16 18 14 18   Temp: (!) 97.5 F (36.4 C)  98.2 F (36.8 C) 98.3 F (36.8 C)  TempSrc: Oral Oral Oral Oral  SpO2: 98% 98% 97% 97%  Weight:      Height:        Physical Exam  General: Frail elderly woman laying comfortably in bed.  No acute distress CV: RRR. No m/r/g. No LE edema. Pulmonary: Lungs CTAB. No wheezes or rales Abdominal: Soft. NT/ND. Normal BS. Extremities: Normal ROM. Palpable pulses Neuro: A&Ox3. Moves all extremities.   Assessment/Plan: Bianca [name] is a 79 y.o. female with PMH of orthostatic hypotension, COPD, lung cancer, A. fib, who presented with acute focal/transient neurologic deficits and admitted for further evaluation and management of stroke versus TIA  Principal Problem:   TIA (transient ischemic attack) Active Problems:   CHRONIC KIDNEY DISEASE STAGE III (MODERATE)   Delirium   AF (paroxysmal atrial fibrillation) (HCC)   Orthostatic hypotension   Slurred speech  Acute transient encephalopathy Delirium, improved Patient continues to make improvements in mental status. No evidence of delirium overnight. Sleepy on exam but follows commands and answers questions appropriately. Patient has a bed at [facility] and now pending insurance authorization.  --Continue delirium  precautions --Continue permissive hypertension --Pending insurance auth for SNF placement  Asymptomatic bacteriuria Urinary retention Patient's urine culture grew >100,000 colonies/mL of Klebsiella pneumonia. Found to have urinary retention overnight with 1.1 L out by In and Out Cath. Patient denied any dysuria, fever or chills this morning. Will monitor with frequent bladder scans and put in foley if she continues to retain urine.  --Bladder scan q4hr --In and Out cath q6hr as needed --Continue to monitor mental status  Atrial fibrillation Stable. In sinus rhythm.  --Continue Eliquis 2.5 mg BID  Orthostatic hypotension Chronic. Patient continues to have positive orthostatic vitals. Patient high risk for falls. BP remains high at 140s-180s/80s-100s. --Continue Florinef plan 1 mg twice daily --Continue as needed midodrine --Continue TED hose   AKI, resolving Creatinine down to 1.2.  Making appropriate urine. --Avoid nephrotoxic agents  HLD Chronic. LDL of 99.  --Continue lipitor 20 mg daily.  Age-related frailty Patient has had gradual decline in function status over the last few months. She has moderate malnutrition and chronic hypotension requiring Florinet and prn Midodrine. Patient will require 24 hr care due to declining functional status. Team have discuss with family the importance goals of care discussion in her current state.  --Family plans to have conversation with patient --Family will follow up with PCP and possible involvement of palliative care.   Diet: Cardiac IVF: None VTE: Eliquis  CODE: Full Code  Prior to Admission Living Arrangement: Home Anticipated Discharge Location: SNF Barriers to Discharge: Medical work up Dispo: Anticipated discharge  in approximately 1-2 day(s).   Signed: Lacinda Axon, MD 07/19/2020, 6:04 AM  Pager: [phone] Internal Medicine Teaching Service After 5pm on weekdays and 1pm on weekends: On Call pager: [phone]

## 2020-07-19 NOTE — TOC Progression Note (Addendum)
Transition of Care [facility]) - Progression Note    Patient Details  Name: Bianca [name] MRN: [id number] Date of Birth: 07/13/1941  Transition of Care [facility]) CM/SW Contact  Pollie Friar, RN Phone Number: 07/19/2020, 11:41 AM  Clinical Narrative:    [facility] has offered a bed. CM has updated daughter: Bianca [name] and they are appreciative. Insurance auth started through [employer]. Covid ordered. TOC following.  1455: So her [employer] was to term out today. She was to start [place] tomorrow. CM updated the family and patient. She has decided to stay with [employer]. CM assisted her in calling [employer] and updating them and cancelling [employer]. Daughter, Bianca [name] aware and in agreement. CM called [employer] authorization line and updated them. They are going to go ahead and continue with the authorization process.   Expected Discharge Plan: [place] Barriers to Discharge: Continued Medical Work up  Expected Discharge Plan and Services Expected Discharge Plan: [place] In-house Referral: Clinical Social Work Discharge Planning Services: CM Consult Post Acute Care Choice: [place] arrangements for the past 2 months: Single Family Home                                       Social Determinants of Health (SDOH) Interventions    Readmission Risk Interventions No flowsheet data found.

## 2020-07-19 NOTE — Progress Notes (Signed)
Physical Therapy Treatment Patient Details Name: Bianca [name] MRN: [id number] DOB: Dec 03, 1940 Today's Date: 07/19/2020    History of Present Illness Pt is an 79 y/o female admitted secondary to AMS and weakness. MRI negative. PMH includes tobacco use, orthostatic hypotension, lung cancer s/p lobectomy, a fib, COPD, and HTN.    PT Comments    Pt limited by symptomatic orthostatic hypotension. When ambulating pt ceases to move forward, becomes less verbally responsive, and does not advance body or Rollator. PT provides maxA to guide pt to recliner. Pt BP does improve some with seated exercise and pt reports resolution of symptoms, pt denies dizziness and lightheadedness throughout session. Orthostatic vitals documented below. Pt will continue to benefit from acute PT POC to improve mobility quality and to reduce falls risk. PT continues to recommend SNF placement.   Follow Up Recommendations  SNF     Equipment Recommendations  None recommended by PT    Recommendations for Other Services       Precautions / Restrictions Precautions Precautions: Fall Precaution Comments: watch BP, (+) orthostatics Restrictions Weight Bearing Restrictions: No    Mobility  Bed Mobility Overal bed mobility: Needs Assistance Bed Mobility: Supine to Sit     Supine to sit: Supervision        Transfers Overall transfer level: Needs assistance Equipment used: 4-wheeled walker Transfers: Sit to/from Stand Sit to Stand: Min assist         General transfer comment: posterior lean  Ambulation/Gait Ambulation/Gait assistance: Min assist;Max assist (maxA for final 5' due to orthostatic symptoms and reduced verbal response) Gait Distance (Feet): 15 Feet Assistive device: 4-wheeled walker Gait Pattern/deviations: Step-to pattern Gait velocity: decreased Gait velocity interpretation: <1.31 ft/sec, indicative of household ambulator General Gait Details: pt with short step-to gait, reduced foot  clearance, increased trunk flexion, requires PT assist for 4 wheeled walker management   Stairs             Wheelchair Mobility    Modified Rankin (Stroke Patients Only) Modified Rankin (Stroke Patients Only) Pre-Morbid Rankin Score: Moderate disability Modified Rankin: Moderately severe disability     Balance Overall balance assessment: Needs assistance Sitting-balance support: Feet supported;No upper extremity supported Sitting balance-Leahy Scale: Good     Standing balance support: Bilateral upper extremity supported Standing balance-Leahy Scale: Poor Standing balance comment: reliant on UE support of Rollator                            Cognition Arousal/Alertness: Awake/alert Behavior During Therapy: WFL for tasks assessed/performed Overall Cognitive Status: No family/caregiver present to determine baseline cognitive functioning Area of Impairment: Problem solving                             Problem Solving: Slow processing        Exercises General Exercises - Lower Extremity Ankle Circles/Pumps: AROM;Both;20 reps;10 reps Gluteal Sets: AROM;Both;10 reps Heel Slides: AROM;Both;10 reps Straight Leg Raises: AROM;Both;10 reps    General Comments General comments (skin integrity, edema, etc.): orthostatic BP vitals: 178/97 supine, 97/57 sitting, 77/55 standing, 80/57 after seated exercise, 103/70 in recliner after ambulation, 113/77 after exercise seated in recliner      Pertinent Vitals/Pain Pain Assessment: No/denies pain    Home Living                      Prior Function  PT Goals (current goals can now be found in the care plan section) Acute Rehab PT Goals Patient Stated Goal: To get stronger Progress towards PT goals: Not progressing toward goals - comment (limited by orthostatic BP)    Frequency    Min 2X/week      PT Plan Current plan remains appropriate    Co-evaluation               AM-PAC PT "6 Clicks" Mobility   Outcome Measure  Help needed turning from your back to your side while in a flat bed without using bedrails?: A Little Help needed moving from lying on your back to sitting on the side of a flat bed without using bedrails?: A Little Help needed moving to and from a bed to a chair (including a wheelchair)?: A Little Help needed standing up from a chair using your arms (e.g., wheelchair or bedside chair)?: A Little Help needed to walk in hospital room?: A Lot Help needed climbing 3-5 steps with a railing? : A Lot 6 Click Score: 16    End of Session Equipment Utilized During Treatment: Gait belt Activity Tolerance: Treatment limited secondary to medical complications (Comment) (orthostatic) Patient left: in chair;with call bell/phone within reach;with chair alarm set Nurse Communication: Mobility status PT Visit Diagnosis: History of falling (Z91.81);Muscle weakness (generalized) (M62.81)     Time: 4825-0037 PT Time Calculation (min) (ACUTE ONLY): 26 min  Charges:  $Therapeutic Exercise: 8-22 mins $Therapeutic Activity: 8-22 mins                     Zenaida Niece, PT, DPT Acute Rehabilitation Pager: [phone]    Zenaida Niece 07/19/2020, 3:59 PM

## 2020-07-20 DIAGNOSIS — R339 Retention of urine, unspecified: Secondary | ICD-10-CM | POA: Diagnosis present

## 2020-07-20 MED ORDER — RAMELTEON 8 MG PO TABS
8.0000 mg | ORAL_TABLET | Freq: Every day | ORAL | Status: AC
  Administered 2020-07-20: 8 mg via ORAL
  Filled 2020-07-20: qty 1

## 2020-07-20 MED ORDER — CHLORHEXIDINE GLUCONATE CLOTH 2 % EX PADS
6.0000 | MEDICATED_PAD | Freq: Every day | CUTANEOUS | Status: DC
  Administered 2020-07-20 – 2020-07-21 (×2): 6 via TOPICAL

## 2020-07-20 NOTE — Telephone Encounter (Signed)
Noted  

## 2020-07-20 NOTE — Progress Notes (Addendum)
Subjective:   Hospital day: 5  Overnight event: Retained urine on repeat bladder scans and foley was placed  This AM, patient was evaluated at the bedside while eating her breakfast. She states she slept fine. She denied any pain with urination, abd pain, SOB, or CP. She was informed that we put in foley catheter overnight because her bladder has become weak and she was starting to retain urine. She will keep the follow in for 4 weeks to allow her bladder to heal then she will follow up with a urologist. Pending insurance authorization for SNF.  Objective:  Vital signs in last 24 hours: Vitals:   07/19/20 1617 07/19/20 2044 07/19/20 2314 07/20/20 0354  BP: (!) 148/90 (!) 185/96 (!) 177/97 (!) 158/83  Pulse: 96 86 85 72  Resp: 18 18 17 16   Temp: 98.8 F (37.1 C) 98 F (36.7 C) 97.9 F (36.6 C) 97.7 F (36.5 C)  TempSrc: Oral Oral Oral Oral  SpO2: 100% 100% 96% 99%  Weight:      Height:        Physical Exam General: Frail elderly woman sitting in bed eating breakfast. No acute distress. CV: RRR. No m/r/g. No LE edema Pulmonary: Lungs CTAB. No wheezes or rales.  Abdominal: Soft. NT/ND. Normal BS Extremities: Normal ROM. Palpable pulses Neuro: A&Ox3. Moves all extremities  Assessment/Plan: Bianca [name] is a 80 y.o. female with PMH of orthostatic hypotension, COPD, lung cancer, A. fib, who presented with acute focal/transient neurologic deficits and admitted for further evaluation and management of stroke versus TIA. Symptoms resolved and patient currently pending SNF placement.  Principal Problem:   TIA (transient ischemic attack) Active Problems:   AF (paroxysmal atrial fibrillation) (HCC)   Protein-calorie malnutrition, severe   Orthostatic hypotension   Urinary retention  Acute transient encephalopathy Delirium, improved Patient's mental status is significantly improved. Sitting up and eating breakfast this morning. We will continue to precautions due to her  history of delirium during hospitalization. Patient offered bed at [facility]. Now pending insurance authorization.  --Continue delirium precautions --Continue permissive hypertension --Follow-up insurance authorization  Urinary retention Retained 786 cc of urine overnight and foley was placed. Continue to assess urine output. --Continue foley --Will need foley in for 4 weeks and follow up with outpatient urology.   Atrial fibrillation Stable. In sinus rhythm.  --Continue Eliquis 2.5 mg BID  Orthostatic hypotension Chronic. BP has been elevated during admission. Systolic BP in the 798X to 170s. Patient continues to have positive orthostatic vitals. Will discontinue Florinef for now due to elevated BP. --Discontinue Florinef --Continue prn midodrine --Continue TED hose   AKI, resolving Creatinine down to 1.2. Making appropriate urine. --Avoid nephrotoxic agents  HLD Chronic. LDL of 99.  --Continue lipitor 20 mg daily.  Age-related frailty Patient has had gradual decline in function status over the last few months. She has moderate malnutrition and chronic hypotension requiring Florinet and prn Midodrine. Patient will require 24 hr care due to declining functional status. Team have discuss with family the importance goals of care discussion in her current state. Can consider inpatient palliative consult if family is in agreement to provide resources at discharge. --Family plans to have conversation with patient --Family will follow up with PCP and possible involvement of palliative care.   Diet: Cardiac IVF: None VTE: Eliquis  CODE: Full Code  Prior to Admission Living Arrangement: Home Anticipated Discharge Location: SNF Barriers to Discharge: Medical work up Dispo: Anticipated discharge in approximately 1-2 day(s).  Signed: Lacinda Axon, MD 07/20/2020, 6:05 AM  Pager: [phone] Internal Medicine Teaching Service After 5pm on weekdays and 1pm on weekends:  On Call pager: [phone]

## 2020-07-20 NOTE — Plan of Care (Signed)
  Problem: Education: Goal: Knowledge of disease or condition will improve Outcome: Progressing Goal: Knowledge of secondary prevention will improve Outcome: Progressing Goal: Knowledge of patient specific risk factors addressed and post discharge goals established will improve Outcome: Progressing Goal: Individualized Educational Video(s) Outcome: Progressing   Problem: Coping: Goal: Will verbalize positive feelings about self Outcome: Progressing Goal: Will identify appropriate support needs Outcome: Progressing   Problem: Health Behavior/Discharge Planning: Goal: Ability to manage health-related needs will improve Outcome: Progressing   Problem: Self-Care: Goal: Ability to participate in self-care as condition permits will improve Outcome: Progressing Goal: Verbalization of feelings and concerns over difficulty with self-care will improve Outcome: Progressing Goal: Ability to communicate needs accurately will improve Outcome: Progressing   Problem: Nutrition: Goal: Risk of aspiration will decrease Outcome: Progressing Goal: Dietary intake will improve Outcome: Progressing   Problem: Ischemic Stroke/TIA Tissue Perfusion: Goal: Complications of ischemic stroke/TIA will be minimized Outcome: Progressing   Problem: Education: Goal: Knowledge of General Education information will improve Description: Including pain rating scale, medication(s)/side effects and non-pharmacologic comfort measures Outcome: Progressing   Problem: Health Behavior/Discharge Planning: Goal: Ability to manage health-related needs will improve Outcome: Progressing   Problem: Clinical Measurements: Goal: Ability to maintain clinical measurements within normal limits will improve Outcome: Progressing Goal: Diagnostic test results will improve Outcome: Progressing Goal: Respiratory complications will improve Outcome: Progressing Goal: Cardiovascular complication will be avoided Outcome:  Progressing   Problem: Activity: Goal: Risk for activity intolerance will decrease Outcome: Progressing   Problem: Nutrition: Goal: Adequate nutrition will be maintained Outcome: Progressing   Problem: Coping: Goal: Level of anxiety will decrease Outcome: Progressing   Problem: Elimination: Goal: Will not experience complications related to bowel motility Outcome: Progressing Goal: Will not experience complications related to urinary retention Outcome: Progressing   Problem: Pain Managment: Goal: General experience of comfort will improve Outcome: Progressing   Problem: Safety: Goal: Ability to remain free from injury will improve Outcome: Progressing   Problem: Skin Integrity: Goal: Risk for impaired skin integrity will decrease Outcome: Progressing

## 2020-07-21 ENCOUNTER — Telehealth: Payer: Self-pay | Admitting: Cardiology

## 2020-07-21 DIAGNOSIS — K5900 Constipation, unspecified: Secondary | ICD-10-CM | POA: Diagnosis not present

## 2020-07-21 DIAGNOSIS — F411 Generalized anxiety disorder: Secondary | ICD-10-CM | POA: Diagnosis not present

## 2020-07-21 DIAGNOSIS — R2681 Unsteadiness on feet: Secondary | ICD-10-CM | POA: Diagnosis not present

## 2020-07-21 DIAGNOSIS — I480 Paroxysmal atrial fibrillation: Secondary | ICD-10-CM | POA: Diagnosis not present

## 2020-07-21 DIAGNOSIS — N1831 Chronic kidney disease, stage 3a: Secondary | ICD-10-CM | POA: Diagnosis not present

## 2020-07-21 DIAGNOSIS — Z7401 Bed confinement status: Secondary | ICD-10-CM | POA: Diagnosis not present

## 2020-07-21 DIAGNOSIS — F331 Major depressive disorder, recurrent, moderate: Secondary | ICD-10-CM | POA: Diagnosis not present

## 2020-07-21 DIAGNOSIS — N319 Neuromuscular dysfunction of bladder, unspecified: Secondary | ICD-10-CM | POA: Diagnosis not present

## 2020-07-21 DIAGNOSIS — R402411 Glasgow coma scale score 13-15, in the field [EMT or ambulance]: Secondary | ICD-10-CM | POA: Diagnosis not present

## 2020-07-21 DIAGNOSIS — E46 Unspecified protein-calorie malnutrition: Secondary | ICD-10-CM | POA: Diagnosis not present

## 2020-07-21 DIAGNOSIS — R0602 Shortness of breath: Secondary | ICD-10-CM | POA: Diagnosis not present

## 2020-07-21 DIAGNOSIS — R3914 Feeling of incomplete bladder emptying: Secondary | ICD-10-CM | POA: Diagnosis not present

## 2020-07-21 DIAGNOSIS — I10 Essential (primary) hypertension: Secondary | ICD-10-CM | POA: Diagnosis not present

## 2020-07-21 DIAGNOSIS — J441 Chronic obstructive pulmonary disease with (acute) exacerbation: Secondary | ICD-10-CM | POA: Diagnosis not present

## 2020-07-21 DIAGNOSIS — R350 Frequency of micturition: Secondary | ICD-10-CM | POA: Diagnosis not present

## 2020-07-21 DIAGNOSIS — I951 Orthostatic hypotension: Secondary | ICD-10-CM | POA: Diagnosis not present

## 2020-07-21 DIAGNOSIS — R339 Retention of urine, unspecified: Secondary | ICD-10-CM | POA: Diagnosis not present

## 2020-07-21 DIAGNOSIS — E785 Hyperlipidemia, unspecified: Secondary | ICD-10-CM | POA: Diagnosis not present

## 2020-07-21 DIAGNOSIS — G459 Transient cerebral ischemic attack, unspecified: Secondary | ICD-10-CM | POA: Diagnosis not present

## 2020-07-21 DIAGNOSIS — M2550 Pain in unspecified joint: Secondary | ICD-10-CM | POA: Diagnosis not present

## 2020-07-21 DIAGNOSIS — Z79899 Other long term (current) drug therapy: Secondary | ICD-10-CM | POA: Diagnosis not present

## 2020-07-21 DIAGNOSIS — Z85118 Personal history of other malignant neoplasm of bronchus and lung: Secondary | ICD-10-CM | POA: Diagnosis not present

## 2020-07-21 DIAGNOSIS — R8271 Bacteriuria: Secondary | ICD-10-CM | POA: Diagnosis not present

## 2020-07-21 DIAGNOSIS — E43 Unspecified severe protein-calorie malnutrition: Secondary | ICD-10-CM | POA: Diagnosis not present

## 2020-07-21 DIAGNOSIS — Z7901 Long term (current) use of anticoagulants: Secondary | ICD-10-CM | POA: Diagnosis not present

## 2020-07-21 DIAGNOSIS — Z87891 Personal history of nicotine dependence: Secondary | ICD-10-CM | POA: Diagnosis not present

## 2020-07-21 DIAGNOSIS — G9340 Encephalopathy, unspecified: Secondary | ICD-10-CM | POA: Diagnosis not present

## 2020-07-21 DIAGNOSIS — J449 Chronic obstructive pulmonary disease, unspecified: Secondary | ICD-10-CM | POA: Diagnosis not present

## 2020-07-21 DIAGNOSIS — F4321 Adjustment disorder with depressed mood: Secondary | ICD-10-CM | POA: Diagnosis not present

## 2020-07-21 MED ORDER — ATORVASTATIN CALCIUM 20 MG PO TABS: 20.0000 mg | ORAL_TABLET | Freq: Every day | ORAL | 0 refills | Status: DC

## 2020-07-21 NOTE — Discharge Instructions (Signed)
To Bianca [name],   It was a pleasure taking care of you during your stay at [facility]. You were evaluated for a stroke, but your work up was negative during your stay. We will discharge you to a skilled nursing facility to continue working up. Please continue to leave the Foley in for 4 weeks for your bladder to heal. After those 4 weeks, please follow up with a urologist. If you have worsening of your symptoms, fevers, or severe pain, please come back for reevaluation.

## 2020-07-21 NOTE — Telephone Encounter (Signed)
Patient's daughter called in reporting she informed the office that her mother was being taken to [facility] for possible stroke on 12/27. States she is concerned that she did not hear back. Her mother is being discharged today and called wanting to speak with Dr. Radford Pax. I informed her that Dr. Radford Pax is not on service this weekend. She seemed quite frustrated on the phone asking why no one had called her back from earlier in the week. I advised that the message was likely received, but that her mother's care would be directed by her inpatient MD team while admitted to [facility]. If they felt cardiology input was needed they would contact us. She asked that Dr. Radford Pax call her directly tomorrow. I informed her I would send message to Dr. Radford Pax to inform of her request.

## 2020-07-21 NOTE — Progress Notes (Signed)
Subjective:   O/N Events: None  [name] was seen and evaluated at bedside this AM. Patient states that she is doing well this morning. Had discussion with patient and daughter about her hospital course and being transferred to SNF today. Family agreeable to the plan. All questions and concerns addressed at bedside.   Objective:  Vital signs in last 24 hours: Vitals:   07/20/20 1524 07/20/20 2016 07/21/20 0040 07/21/20 0441  BP: 126/76 (!) 176/83 (!) 182/86 (!) 180/87  Pulse: 93 80 66 85  Resp: 16 18 18 18   Temp: 98.1 F (36.7 C) 98.7 F (37.1 C) 98.3 F (36.8 C) 98.1 F (36.7 C)  TempSrc: Oral Oral Oral Oral  SpO2: 98% 99% 97% 97%  Weight:      Height:        Physical Exam Physical Exam Constitutional:      Appearance: Normal appearance.  HENT:     Head: Normocephalic and atraumatic.  Cardiovascular:     Rate and Rhythm: Normal rate and regular rhythm.     Pulses: Normal pulses.     Heart sounds: Normal heart sounds. No murmur heard. No friction rub. No gallop.   Pulmonary:     Effort: Pulmonary effort is normal.     Breath sounds: Normal breath sounds. No wheezing, rhonchi or rales.  Chest:     Chest wall: No tenderness.  Abdominal:     General: Abdomen is flat. Bowel sounds are normal.     Palpations: Abdomen is soft.     Tenderness: There is no abdominal tenderness. There is no guarding.     Hernia: No hernia is present.  Neurological:     Mental Status: She is alert.     Assessment/Plan: Bianca [name] is a 80 y.o. female with PMH of orthostatic hypotension, COPD, lung cancer, A. fib, who presented with acute focal/transient neurologic deficits and admitted for further evaluation and management of stroke versus TIA. Symptoms resolved and patient currently pending SNF placement.  Principal Problem:   TIA (transient ischemic attack) Active Problems:   AF (paroxysmal atrial fibrillation) (HCC)   Protein-calorie malnutrition, severe   Orthostatic  hypotension   Urinary retention   Acute transient encephalopathy Delirium, improved Patient's mental status is significantly improved. Conversant at bedside this AM. We will continue to precautions due to her history of delirium during hospitalization. Patient offered bed at [facility]. --Continue delirium precautions --Continue permissive hypertension  Urinary retention --Continue foley --Will need foley in for 4 weeks and follow up with outpatient urology.   Atrial fibrillation Stable. In sinus rhythm.  --Continue Eliquis 2.5 mg BID  Orthostatic hypotension Chronic. BP have continued to be elevated during admission. Systolic BP in the 671I to 180s. Patient continues to have positive orthostatic vitals. Florinef D/C'd 1/2. --Continue prn midodrine --Continue TED hose   AKI: Creatinine down to 1.2.  --Avoid nephrotoxic agents  HLD Chronic. LDL of 99.  --Continue lipitor 20 mg daily.  Age-related frailty Patient has had gradual decline in function status over the last few months. She has moderate malnutrition and chronic hypotension requiring Florinet and prn Midodrine. Patient will require 24 hr care due to declining functional status. Team have discuss with family the importance goals of care discussion in her current state. Can consider inpatient palliative consult if family is in agreement to provide resources at discharge. --Family plans to have conversation with patient. --Family will follow up with PCP and possible involvement of palliative care.   Diet:  Cardiac IVF: None VTE: Eliquis  CODE: Full Code  Prior to Admission Living Arrangement: Home Anticipated Discharge Location: SNF Barriers to Discharge: Bed placement @ SNF.  Dispo: Anticipated discharge in approximately today.   Signed: Maudie Mercury, MD 07/21/2020, 6:25 AM  Pager: [phone] Internal Medicine Teaching Service After 5pm on weekdays and 1pm on weekends: On Call pager: [phone]

## 2020-07-21 NOTE — TOC Transition Note (Signed)
Transition of Care [facility] - CM/SW Discharge Note   Patient Details  Name: CAMBER NINH MRN: [id number] Date of Birth: 10-23-1940  Transition of Care [facility]) CM/SW Contact:  Coralee Pesa, [place] Phone Number: 07/21/2020, 9:59 AM   Clinical Narrative:    Nurse to call report to [phone] Rm# [id number]   Final next level of care: Skilled Nursing Facility Barriers to Discharge: Barriers Resolved   Patient Goals and CMS Choice   CMS Medicare.gov Compare Post Acute Care list provided to:: Patient Represenative (must comment) Choice offered to / list presented to : Adult Children  Discharge Placement              Patient chooses bed at: [facility] [place] Patient to be transferred to facility by: [place] Name of family member notified: [name] Patient and family notified of of transfer: 07/21/20  Discharge Plan and Services In-house Referral: Clinical Social Work Discharge Planning Services: [employer] Consult Post Acute Care Choice: [place]                               Social Determinants of Health (SDOH) Interventions     Readmission Risk Interventions No flowsheet data found.

## 2020-07-22 DIAGNOSIS — Z79899 Other long term (current) drug therapy: Secondary | ICD-10-CM | POA: Diagnosis not present

## 2020-07-22 NOTE — Telephone Encounter (Signed)
I attempted to call patient's daughter and no answer.  I reviewed the discharge summary of her hospitalization and no active cardiac issues were noted.  Nothing more to add at this time

## 2020-07-23 ENCOUNTER — Other Ambulatory Visit: Payer: Self-pay

## 2020-07-23 ENCOUNTER — Encounter: Payer: Self-pay | Admitting: Internal Medicine

## 2020-07-23 ENCOUNTER — Telehealth: Payer: Self-pay | Admitting: Physician Assistant

## 2020-07-23 ENCOUNTER — Ambulatory Visit: Payer: PPO | Admitting: Internal Medicine

## 2020-07-23 VITALS — BP 106/62 | HR 100 | Ht 65.0 in | Wt 103.0 lb

## 2020-07-23 DIAGNOSIS — I951 Orthostatic hypotension: Secondary | ICD-10-CM

## 2020-07-23 DIAGNOSIS — I480 Paroxysmal atrial fibrillation: Secondary | ICD-10-CM

## 2020-07-23 MED ORDER — PYRIDOSTIGMINE BROMIDE 60 MG PO TABS: 30.0000 mg | ORAL_TABLET | Freq: Two times a day (BID) | ORAL | 3 refills | Status: DC

## 2020-07-23 NOTE — Telephone Encounter (Signed)
Her daughter Bianca [name] through her own personal MyChart about her mother, Bianca [name], needing referrals etc. Patient has been in hospital and then released to nursing home. Unfortunately Bianca [name] is not on the DPR for me to release information to. We can however call the daughter Bianca [name] who is listed under emergency contacts to set up an appointment and get everything straightened out.

## 2020-07-23 NOTE — Progress Notes (Signed)
HPI Bianca [name] returns today for followup. She is a pleasant 80 yo woman with severe orthostatic hypotension, lung CA s/p resection, HTN, and failure to thrive. She had been on a combination of meds including midodrine, florinef and mestinon with improvement but did have periods of HTN which most recently resulting in her presenting to the ED with TIA symptoms and elevated bp's. She presented to the ED last week with a TIA and her mestinon was stopped as the bp was high. Since she got home she has felt fatigued. Her bp has been down. Her appetite is also down. She has not had frank syncope.  Allergies  Allergen Reactions  . Alprazolam Other (See Comments)    Combativeness and aggression  . Buspar [Buspirone] Other (See Comments)    Causes evident lethargy and is even more pronounced when taken in conjunction with Remeron  . Mirtazapine Other (See Comments)    Causes evident lethargy   . Other Other (See Comments)    Sedation- Causes agitation and aggression  Changing patient's "usual" medication regimen usually causes "a lot of issues with her, physically"     Current Outpatient Medications  Medication Sig Dispense Refill  . acetaminophen (TYLENOL) 500 MG tablet Take 1,000 mg by mouth every 6 (six) hours as needed (for headaches).    . ADVAIR DISKUS 100-50 MCG/DOSE AEPB INHALE 1 PUFF TWICE A DAY AS DIRECTED **RINSE MOUTH AFTER USE** (Patient taking differently: Inhale 1 puff into the lungs See admin instructions. INHALE 1 PUFF INTO THE LUNGS TWICE A DAY AS DIRECTED- **RINSE MOUTH AFTER USE**) 60 each 5  . atorvastatin (LIPITOR) 20 MG tablet Take 1 tablet (20 mg total) by mouth daily. 30 tablet 0  . Calcium Carb-Cholecalciferol (CALCIUM+D3 PO) Take 1 tablet by mouth daily.    . Cholecalciferol (VITAMIN D3) 50 MCG (2000 UT) TABS Take 2,000 Units by mouth daily.     Marland Kitchen ELIQUIS 2.5 MG TABS tablet TAKE 1 TABLET BY MOUTH TWICE DAILY (Patient taking differently: Take 2.5 mg by mouth 2 (two)  times daily.) 60 tablet 5  . ferrous gluconate (FERGON) 324 MG tablet Take 1 tablet (324 mg total) by mouth 2 (two) times daily with a meal.  3  . lactose free nutrition (BOOST) LIQD Take 237 mLs by mouth 2 (two) times daily between meals.    . midodrine (PROAMATINE) 5 MG tablet Take 1/2 tablet for systolic blood pressure between 120 - 160.  Take 1 tablet for systolic blood pressure less than 120. (Patient taking differently: Take 2.5-5 mg by mouth See admin instructions. Take 2.5 mg by mouth for systolic B/P between 353-614 and 5 mg for a systolic B/P less than 431) 270 tablet 3  . PROAIR HFA 108 (90 Base) MCG/ACT inhaler INHALE 1-2 PUFFS INTO THE LUNGS EVERY 4 HOURS AS NEEDED (Patient taking differently: Inhale 1-2 puffs into the lungs every 4 (four) hours as needed for wheezing or shortness of breath.) 8.5 g 3  . vitamin C (ASCORBIC ACID) 500 MG tablet Take 500 mg by mouth daily.      No current facility-administered medications for this visit.     Past Medical History:  Diagnosis Date  . Anxiety   . Arthritis   . Asthma   . COPD (chronic obstructive pulmonary disease) ([place])   . HOH (hard of hearing)   . Hypertension   . Wears dentures    top and partial bottom  . Wears glasses  ROS:   All systems reviewed and negative except as noted in the HPI.   Past Surgical History:  Procedure Laterality Date  . APPENDECTOMY    . BREAST LUMPECTOMY WITH NEEDLE LOCALIZATION Right 04/21/2013   Procedure: BREAST LUMPECTOMY WITH NEEDLE LOCALIZATION;  Surgeon: Harl Bowie, MD;  Location: [place];  Service: General;  Laterality: Right;  . BREAST SURGERY  1985   cyst rt br  . MASTECTOMY W/ SENTINEL NODE BIOPSY Left 04/21/2013   Procedure: MASTECTOMY WITH SENTINEL LYMPH NODE BIOPSY;  Surgeon: Harl Bowie, MD;  Location: [place];  Service: General;  Laterality: Left;  . OTHER SURGICAL HISTORY     cysts removed from foot  . OTHER SURGICAL HISTORY      cysts removed from breast  . 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   Procedure: TOTAL HIP ARTHROPLASTY ANTERIOR APPROACH;  Surgeon: Mcarthur Rossetti, MD;  Location: [place];  Service: Orthopedics;  Laterality: Right;  . TUBAL LIGATION       Family History  Problem Relation Age of Onset  . CAD Mother        possible small heart attack  . Thyroid disease Sister        she thinks hyperthyroidism     Social History   Socioeconomic History  . Marital status: Widowed    Spouse name: Not on file  . Number of children: 2  . Years of education: Not on file  . Highest education level: Some college, no degree  Occupational History  . Occupation: retired    Fish farm manager: RETIRED  Tobacco Use  . Smoking status: Former Smoker    Packs/day: 0.25    Types: Cigarettes  . Smokeless tobacco: Never Used  Substance and Sexual Activity  . Alcohol use: No  . Drug use: No  . Sexual activity: Not on file    Comment: 1-2 daily  Other Topics Concern  . Not on file  Social History Narrative   Has a living will   Social Determinants of Health   Financial Resource Strain: Low Risk   . Difficulty of Paying Living Expenses: Not hard at all  Food Insecurity: No Food Insecurity  . Worried About Charity fundraiser in the Last Year: Never true  . Ran Out of Food in the Last Year: Never true  Transportation Needs: No Transportation Needs  . Lack of Transportation (Medical): No  . Lack of Transportation (Non-Medical): No  Physical Activity: Inactive  . Days of Exercise per Week: 0 days  . Minutes of Exercise per Session: 0 min  Stress: No Stress Concern Present  . Feeling of Stress : Not at all  Social Connections: Socially Isolated  . Frequency of Communication with Friends and Family: More than three times a week  . Frequency of Social Gatherings with Friends and Family: More than three times a week  . Attends Religious Services: Never  . Active Member of Clubs or Organizations: No   . Attends Archivist Meetings: Never  . Marital Status: Widowed  Intimate Partner Violence: Not At Risk  . Fear of Current or Ex-Partner: No  . Emotionally Abused: No  . Physically Abused: No  . Sexually Abused: No     BP 106/62   Pulse 100   Ht 5\' 5"  (1.651 m)   Wt 103 lb (46.7 kg)   LMP  (LMP Unknown)   SpO2 96%   BMI 17.14 kg/m   Physical Exam:  Well  appearing NAD HEENT: Unremarkable Neck:  No JVD, no thyromegally Lymphatics:  No adenopathy Back:  No CVA tenderness Lungs:  Clear with no wheezes HEART:  Regular rate rhythm, no murmurs, no rubs, no clicks Abd:  soft, positive bowel sounds, no organomegally, no rebound, no guarding Ext:  2 plus pulses, no edema, no cyanosis, no clubbing Skin:  No rashes no nodules Neuro:  CN II through XII intact, motor grossly intact  Assess/Plan: 1. Autonomic dysfunction - we discussed the pro's and con's of adding back the mestinon. Her bp is a little too low and I have first asked her to restart the mestinon which appeared to benefit her. If this does not help then adding florinef back would be the next step.  2. Failure to thrive - her weight is down 6 lbs in 4 months. I have encouraged the patient to increase her acitivity.   I spent 40 minutes including50% face to face time with the patient and her family answering there extensive list of questions.   Carleene Overlie Hai Grabe,MD

## 2020-07-23 NOTE — Patient Instructions (Addendum)
Medication Instructions:  Your physician has recommended you make the following change in your medication:   1.   Continue mestinon 60 mg-  Take 1/2 tablet by mouth twice a day   Labwork: None ordered.  Testing/Procedures: None ordered.  Follow-Up: Your physician wants you to follow-up in: 6 weeks with Dr. Lovena Le.     September 10, 2020 at 11:45 am   Any Other Special Instructions Will Be Listed Below (If Applicable).  Will contact [facility] regarding a referral for in home skilled nursing.  If you need a refill on your cardiac medications before your next appointment, please call your pharmacy.

## 2020-07-24 DIAGNOSIS — G9340 Encephalopathy, unspecified: Secondary | ICD-10-CM | POA: Diagnosis not present

## 2020-07-24 DIAGNOSIS — N319 Neuromuscular dysfunction of bladder, unspecified: Secondary | ICD-10-CM | POA: Diagnosis not present

## 2020-07-24 DIAGNOSIS — Z7901 Long term (current) use of anticoagulants: Secondary | ICD-10-CM | POA: Diagnosis not present

## 2020-07-24 DIAGNOSIS — I480 Paroxysmal atrial fibrillation: Secondary | ICD-10-CM | POA: Diagnosis not present

## 2020-07-24 DIAGNOSIS — J449 Chronic obstructive pulmonary disease, unspecified: Secondary | ICD-10-CM | POA: Diagnosis not present

## 2020-07-24 DIAGNOSIS — E46 Unspecified protein-calorie malnutrition: Secondary | ICD-10-CM | POA: Diagnosis not present

## 2020-07-24 DIAGNOSIS — R2681 Unsteadiness on feet: Secondary | ICD-10-CM | POA: Diagnosis not present

## 2020-07-24 DIAGNOSIS — K5900 Constipation, unspecified: Secondary | ICD-10-CM | POA: Diagnosis not present

## 2020-07-26 DIAGNOSIS — F411 Generalized anxiety disorder: Secondary | ICD-10-CM | POA: Diagnosis not present

## 2020-07-26 DIAGNOSIS — F4321 Adjustment disorder with depressed mood: Secondary | ICD-10-CM | POA: Diagnosis not present

## 2020-07-26 DIAGNOSIS — F331 Major depressive disorder, recurrent, moderate: Secondary | ICD-10-CM | POA: Diagnosis not present

## 2020-07-29 ENCOUNTER — Telehealth: Payer: Self-pay | Admitting: Internal Medicine

## 2020-07-29 ENCOUNTER — Encounter: Payer: Self-pay | Admitting: Physician Assistant

## 2020-07-29 NOTE — Telephone Encounter (Signed)
Returned call to [employer] at [facility].  Per Kelly-Pt is getting midodrine 2.5 mg because they are unable to do a sliding scale at the nursing home.  Advised previously that daughter had increased midodrine to 5 mg PO TID after visit on 06/18/20 because of continued hypotension on 2.5 mg.  Claiborne Billings will discuss with [facility] physician and call back if any further help needed managing Pt's orthostatic hypotension.

## 2020-07-29 NOTE — Telephone Encounter (Signed)
Pt c/o BP issue: STAT if pt c/o blurred vision, one-sided weakness or slurred speech  1. What are your last 5 BP readings?  07/29/20- BP: 141/77 (sitting)      BP: 86/50 (standing)       BP: 92/50 (standing)     2. Are you having any other symptoms (ex. Dizziness, headache, blurred vision, passed out)? No   3. What is your BP issue? Kelly with [facility] is calling to provide recent BP's and to follow up with Dr. Tanna Furry nurse.

## 2020-07-29 NOTE — Telephone Encounter (Signed)
Returned call to daughter.  Advised daughter to provide education to staff at [facility] regarding care for mother.  Per daughter she is on a low salt diet and has limited access to fluids.  Advised to discuss with staff that Pt requires a high salt diet and liberal fluids.  Advised to discuss with staff how they are giving Pt her medication.  Pt will follow up with [facility] staff and call if they need any records sent.  Also advised daughter that per [facility]-daughter should call and speak with representative from "member services" to start a request for [facility] services.  Advised to call PCP's office to set up appointment to coordinate.  Daughter indicates understanding.

## 2020-07-29 NOTE — Telephone Encounter (Signed)
Patient is in [place]. Patients BP went from 130 to 107. Think medication needs to be increased or they are not giving it to her the right way. Please call back

## 2020-07-30 ENCOUNTER — Telehealth: Payer: Self-pay | Admitting: Physician Assistant

## 2020-07-30 DIAGNOSIS — Z79899 Other long term (current) drug therapy: Secondary | ICD-10-CM | POA: Diagnosis not present

## 2020-07-30 NOTE — Telephone Encounter (Signed)
Bianca [name] with [facility], PT now is in skilled nursing, Family reached out to [facility] and wants to see if they can get nursing assistance Bianca [name] wants Bianca [name] to be contacted [phone] Please call contact re seeing about getting additional nursing assistance.

## 2020-07-31 ENCOUNTER — Telehealth: Payer: Self-pay

## 2020-07-31 DIAGNOSIS — F411 Generalized anxiety disorder: Secondary | ICD-10-CM | POA: Diagnosis not present

## 2020-07-31 DIAGNOSIS — F331 Major depressive disorder, recurrent, moderate: Secondary | ICD-10-CM | POA: Diagnosis not present

## 2020-07-31 DIAGNOSIS — F4321 Adjustment disorder with depressed mood: Secondary | ICD-10-CM | POA: Diagnosis not present

## 2020-07-31 NOTE — Telephone Encounter (Signed)
Spoke with pt's daughter [name]) and she said that they are currently looking into different home health companies and will let you know which one they would like to go with. Just a [place]

## 2020-07-31 NOTE — Telephone Encounter (Signed)
Copied from [place] [id number]. Topic: General - Other >> Jul 31, 2020 11:05 AM Yvette Rack wrote: Reason for CRM: Patient daughter Ofilia Neas stated patient is in an assistant living facility and she has been having some issues. [name] stated she is patient's POA and she would like to request the HIPPA form so can have her mother sign it so that both she and her sister could be added to patient chart so they can receive information about the patient. Cb# [phone]

## 2020-08-01 DIAGNOSIS — I951 Orthostatic hypotension: Secondary | ICD-10-CM | POA: Diagnosis not present

## 2020-08-01 DIAGNOSIS — Z87891 Personal history of nicotine dependence: Secondary | ICD-10-CM | POA: Diagnosis not present

## 2020-08-01 DIAGNOSIS — N1831 Chronic kidney disease, stage 3a: Secondary | ICD-10-CM | POA: Diagnosis not present

## 2020-08-01 DIAGNOSIS — I480 Paroxysmal atrial fibrillation: Secondary | ICD-10-CM | POA: Diagnosis not present

## 2020-08-01 DIAGNOSIS — J441 Chronic obstructive pulmonary disease with (acute) exacerbation: Secondary | ICD-10-CM | POA: Diagnosis not present

## 2020-08-03 DIAGNOSIS — E46 Unspecified protein-calorie malnutrition: Secondary | ICD-10-CM | POA: Diagnosis not present

## 2020-08-03 DIAGNOSIS — N319 Neuromuscular dysfunction of bladder, unspecified: Secondary | ICD-10-CM | POA: Diagnosis not present

## 2020-08-03 DIAGNOSIS — R8271 Bacteriuria: Secondary | ICD-10-CM | POA: Diagnosis not present

## 2020-08-03 DIAGNOSIS — I10 Essential (primary) hypertension: Secondary | ICD-10-CM | POA: Diagnosis not present

## 2020-08-03 DIAGNOSIS — J449 Chronic obstructive pulmonary disease, unspecified: Secondary | ICD-10-CM | POA: Diagnosis not present

## 2020-08-06 NOTE — Telephone Encounter (Signed)
Both daughters was advised that they are on mothers DPR already.

## 2020-08-09 DIAGNOSIS — R350 Frequency of micturition: Secondary | ICD-10-CM | POA: Diagnosis not present

## 2020-08-09 DIAGNOSIS — R3914 Feeling of incomplete bladder emptying: Secondary | ICD-10-CM | POA: Diagnosis not present

## 2020-08-14 ENCOUNTER — Other Ambulatory Visit: Payer: Self-pay | Admitting: Internal Medicine

## 2020-08-14 DIAGNOSIS — N3020 Other chronic cystitis without hematuria: Secondary | ICD-10-CM | POA: Diagnosis not present

## 2020-08-14 DIAGNOSIS — R350 Frequency of micturition: Secondary | ICD-10-CM | POA: Diagnosis not present

## 2020-08-14 DIAGNOSIS — R3914 Feeling of incomplete bladder emptying: Secondary | ICD-10-CM | POA: Diagnosis not present

## 2020-08-14 DIAGNOSIS — R338 Other retention of urine: Secondary | ICD-10-CM | POA: Diagnosis not present

## 2020-08-20 ENCOUNTER — Other Ambulatory Visit: Payer: Self-pay

## 2020-08-20 ENCOUNTER — Encounter: Payer: Self-pay | Admitting: Adult Health

## 2020-08-20 ENCOUNTER — Ambulatory Visit: Payer: PPO | Admitting: Adult Health

## 2020-08-20 VITALS — BP 137/88 | HR 104 | Ht 65.0 in | Wt 105.0 lb

## 2020-08-20 DIAGNOSIS — R259 Unspecified abnormal involuntary movements: Secondary | ICD-10-CM

## 2020-08-20 DIAGNOSIS — I951 Orthostatic hypotension: Secondary | ICD-10-CM

## 2020-08-20 DIAGNOSIS — G4752 REM sleep behavior disorder: Secondary | ICD-10-CM | POA: Diagnosis not present

## 2020-08-20 DIAGNOSIS — R293 Abnormal posture: Secondary | ICD-10-CM | POA: Diagnosis not present

## 2020-08-20 DIAGNOSIS — G459 Transient cerebral ischemic attack, unspecified: Secondary | ICD-10-CM

## 2020-08-20 DIAGNOSIS — I480 Paroxysmal atrial fibrillation: Secondary | ICD-10-CM | POA: Diagnosis not present

## 2020-08-20 NOTE — Progress Notes (Signed)
[facility] [address] [place]. [place] [address] [phone]       HOSPITAL FOLLOW UP NOTE  [name] Date of Birth:  Jun 16, 1941 Medical Record Number:  [id number]   Reason for Referral:  hospital stroke follow up    SUBJECTIVE:   CHIEF COMPLAINT:  Chief Complaint  Patient presents with  . Follow-up    Rm 14 with daughter (Bianca [name]) Pt states she is well.    HPI:   Bianca [name] is a 80 y.o. female with history of tobacco use (quit September 2021), lung cancer s/p LLL lobectomy at [facility] 06/11/2020, surgery for fractured hip s/p fall in July 2021, atrial fibrillation on Eliquis, COPD, hypertension, CAD, and prior syncope   who presented to [facility] ED on 07/15/2020 with AMS, right-sided weakness, right facial droop, and slurred speech.   Personally reviewed hospitalization pertinent progress notes, lab work and imaging with summary provided.  Evaluated by Dr. Erlinda Hong with MRI negative for acute infarct and symptoms likely due to left brain TIA in setting of presyncope secondary to orthostatic hypotension.  MRA head/neck unremarkable.  History of atrial fibrillation on Eliquis 2.5 mg twice daily PTA and recommended continuation at discharge.  Orthostatic hypotension and supine hypertension longstanding issue followed by Dr. Lovena Le with recent medication adjustments prior to admission with recent worsening after hip fracture 01/2020 and lobectomy 05/2020.  LDL 99 and initiate atorvastatin 20 mg daily.  Recent tobacco use quit 03/2020 with smoking cessation counseling provided.  Other stroke risk factors include advanced age and CAD but no prior stroke history.  L brain TIA likely in the setting of pre-syncope secondary to orthostatic hypotension   Code Stroke CT head No acute abnormality. Small vessel disease. Atrophy.   MRI  No acute infarct.   MRA head & neck No LVO or sign stenosis   2D Echo EF 60-65%. No source of embolus   LDL 99  HgbA1c 5.2  VTE  prophylaxis - Eliquis  Eliquis (apixaban) daily 2.5 bid prior to admission, now on Eliquis (apixaban) daily 2.5 bid. Continue on discharge.  Therapy recommendations:  SNF  Disposition:   SNF   Today, 08/20/2020, Bianca [name] is being seen for hospital follow-up accompanied by her daughter, Bianca [name].  She has been doing well from a stroke standpoint and has since returned home from SNF last week.  She is currently awaiting to start [facility] therapies.  She denies any new or reoccurring stroke/TIA symptoms.  She has remained on Eliquis 2.5 mg twice daily without bleeding or bruising.  She has remained on atorvastatin 20 mg daily without myalgias.  Blood pressure today 137/88.  She has remained on midodrine 3 times daily for continued orthostatic hypotension.  Routinely follows with Dr. Lovena Le but recently seen by [facility] cardiologist for second opinion on 08/01/2020 with OV note personally reviewed with plans on follow-up around the end of February recommending at least a 10 pound weight gain and today's evaluation.  Per daughter, she believes today's visit was also to further evaluate orthostatic hypotension.  Advised daughter and patient that today's visit was for hospital follow-up but brief history regarding orthostatic hypotension and supine hypertension obtained.  This has been a chronic issue greater than 10 years but recently worsening over the past 6 to 12 months especially after the hip fracture s/p right total hip arthroplasty in 01/2020 and lobectomy in November. She been routinely follow by cardiology who has been managing and recently seen by [facility] cardiology  with ortho vitals reported at 146/60 standing and 88/54 standing. She denies any prior neurological work-up.  No further concerns at this time.   ROS:   14 system review of systems performed and negative with exception of those listed in HPI  PMH:  Past Medical History:  Diagnosis Date  . Anxiety   . Arthritis   . Asthma   .  COPD (chronic obstructive pulmonary disease) ([place])   . HOH (hard of hearing)   . Hypertension   . Wears dentures    top and partial bottom  . Wears glasses     PSH:  Past Surgical History:  Procedure Laterality Date  . APPENDECTOMY    . BREAST LUMPECTOMY WITH NEEDLE LOCALIZATION Right 04/21/2013   Procedure: BREAST LUMPECTOMY WITH NEEDLE LOCALIZATION;  Surgeon: Harl Bowie, MD;  Location: [place];  Service: General;  Laterality: Right;  . BREAST SURGERY  1985   cyst rt br  . MASTECTOMY W/ SENTINEL NODE BIOPSY Left 04/21/2013   Procedure: MASTECTOMY WITH SENTINEL LYMPH NODE BIOPSY;  Surgeon: Harl Bowie, MD;  Location: [facility];  Service: General;  Laterality: Left;  . OTHER SURGICAL HISTORY     cysts removed from foot  . OTHER SURGICAL HISTORY     cysts removed from breast  . 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   Procedure: TOTAL HIP ARTHROPLASTY ANTERIOR APPROACH;  Surgeon: Mcarthur Rossetti, MD;  Location: [facility];  Service: Orthopedics;  Laterality: Right;  . TUBAL LIGATION      Social History:  Social History   Socioeconomic History  . Marital status: Widowed    Spouse name: Not on file  . Number of children: 2  . Years of education: Not on file  . Highest education level: Some college, no degree  Occupational History  . Occupation: retired    Fish farm manager: RETIRED  Tobacco Use  . Smoking status: Former Smoker    Packs/day: 0.25    Types: Cigarettes  . Smokeless tobacco: Never Used  Substance and Sexual Activity  . Alcohol use: No  . Drug use: No  . Sexual activity: Not on file    Comment: 1-2 daily  Other Topics Concern  . Not on file  Social History Narrative   Has a living will   Social Determinants of Health   Financial Resource Strain: Low Risk   . Difficulty of Paying Living Expenses: Not hard at all  Food Insecurity: No Food Insecurity  . Worried About Charity fundraiser in the Last Year: Never  true  . Ran Out of Food in the Last Year: Never true  Transportation Needs: No Transportation Needs  . Lack of Transportation (Medical): No  . Lack of Transportation (Non-Medical): No  Physical Activity: Inactive  . Days of Exercise per Week: 0 days  . Minutes of Exercise per Session: 0 min  Stress: No Stress Concern Present  . Feeling of Stress : Not at all  Social Connections: Socially Isolated  . Frequency of Communication with Friends and Family: More than three times a week  . Frequency of Social Gatherings with Friends and Family: More than three times a week  . Attends Religious Services: Never  . Active Member of Clubs or Organizations: No  . Attends Archivist Meetings: Never  . Marital Status: Widowed  Intimate Partner Violence: Not At Risk  . Fear of Current or Ex-Partner: No  . Emotionally Abused: No  . Physically Abused: No  .  Sexually Abused: No    Family History:  Family History  Problem Relation Age of Onset  . CAD Mother        possible small heart attack  . Thyroid disease Sister        she thinks hyperthyroidism    Medications:   Current Outpatient Medications on File Prior to Visit  Medication Sig Dispense Refill  . acetaminophen (TYLENOL) 500 MG tablet Take 1,000 mg by mouth every 6 (six) hours as needed (for headaches).    . ADVAIR DISKUS 100-50 MCG/DOSE AEPB INHALE 1 PUFF TWICE A DAY AS DIRECTED **RINSE MOUTH AFTER USE** (Patient taking differently: Inhale 1 puff into the lungs See admin instructions. INHALE 1 PUFF INTO THE LUNGS TWICE A DAY AS DIRECTED- **RINSE MOUTH AFTER USE**) 60 each 5  . atorvastatin (LIPITOR) 20 MG tablet Take 1 tablet (20 mg total) by mouth daily. 30 tablet 0  . Calcium Carb-Cholecalciferol (CALCIUM+D3 PO) Take 1 tablet by mouth daily.    . Cholecalciferol (VITAMIN D3) 50 MCG (2000 UT) TABS Take 2,000 Units by mouth daily.     Marland Kitchen ELIQUIS 2.5 MG TABS tablet TAKE 1 TABLET BY MOUTH TWICE DAILY (Patient taking differently:  Take 2.5 mg by mouth with breakfast, with lunch, and with evening meal.) 60 tablet 5  . ferrous gluconate (FERGON) 324 MG tablet Take 1 tablet (324 mg total) by mouth 2 (two) times daily with a meal.  3  . lactose free nutrition (BOOST) LIQD Take 237 mLs by mouth 2 (two) times daily between meals.    . midodrine (PROAMATINE) 5 MG tablet Take 1/2 tablet for systolic blood pressure between 120 - 160.  Take 1 tablet for systolic blood pressure less than 120. (Patient taking differently: Take 2.5-5 mg by mouth See admin instructions. Take 2.5 mg by mouth for systolic B/P between 756-433 and 5 mg for a systolic B/P less than 295) 270 tablet 3  . PROAIR HFA 108 (90 Base) MCG/ACT inhaler INHALE 1-2 PUFFS INTO THE LUNGS EVERY 4 HOURS AS NEEDED (Patient taking differently: Inhale 1-2 puffs into the lungs every 4 (four) hours as needed for wheezing or shortness of breath.) 8.5 g 3  . pyridostigmine (MESTINON) 60 MG tablet Take 0.5 tablets (30 mg total) by mouth in the morning and at bedtime. 90 tablet 3  . vitamin C (ASCORBIC ACID) 500 MG tablet Take 500 mg by mouth daily.      No current facility-administered medications on file prior to visit.    Allergies:   Allergies  Allergen Reactions  . Alprazolam Other (See Comments)    Combativeness and aggression  . Buspar [Buspirone] Other (See Comments)    Causes evident lethargy and is even more pronounced when taken in conjunction with Remeron  . Mirtazapine Other (See Comments)    Causes evident lethargy   . Other Other (See Comments)    Sedation- Causes agitation and aggression  Changing patient's "usual" medication regimen usually causes "a lot of issues with her, physically"      OBJECTIVE:  Physical Exam  Vitals:   08/20/20 1409  BP: 137/88  Pulse: (!) 104  Weight: 105 lb (47.6 kg)  Height: 5\' 5"  (1.651 m)   Body mass index is 17.47 kg/m. No exam data present  General: Frail very pleasant elderly Caucasian female, seated, in no  evident distress Head: head normocephalic and atraumatic.   Neck: supple with no carotid or supraclavicular bruits Cardiovascular: regular rate and rhythm, no murmurs Musculoskeletal: no  deformity Skin:  no rash/petichiae Vascular:  Normal pulses all extremities   Neurologic Exam Mental Status: Awake and fully alert.   Fluent speech and language.  Oriented to place and time. Recent and remote memory intact. Attention span, concentration and fund of knowledge appropriate. Mood and affect appropriate.  Cranial Nerves: Fundoscopic exam reveals sharp disc margins. Pupils equal, briskly reactive to light. Extraocular movements full without nystagmus. Visual fields full to confrontation. Hearing intact. Facial sensation intact. Face, tongue, palate moves normally and symmetrically.  Motor: Normal bulk and tone. Normal strength in all tested extremity muscles Sensory.: intact to touch , pinprick , position and vibratory sensation.  Denies sensory impairment or numbness/tingling Coordination: Rapid alternating movements normal in all extremities. Finger-to-nose and heel-to-shin performed accurately bilaterally.  No evidence of cogwheel rigidity.  No evidence of resting or action tremors.  No evidence of bradykinesia. Gait and Station: Arises from chair without difficulty. Stance is slightly hunched.  Gait abnormality with short shuffled steps and use of Rollator walker Reflexes: 1+ and symmetric. Toes downgoing.     NIHSS  0 Modified Rankin  0      ASSESSMENT: Bianca [name] is a 80 y.o. year old female presented with AMS, right-sided weakness, right facial droop and slurred speech on 07/15/2020 likely left brain TIA in setting of presyncope secondary to orthostatic hypotension. Vascular risk factors include longstanding history of orthostatic hypotension with supine hypertension, atrial fibrillation on Eliquis, HLD, tobacco use and advanced age as well as recent history of lung cancer s/p LLL  lobectomy 05/2020 and hip fracture s/p surgical repair 03/2020.      PLAN:  1. TIA:  Continue Eliquis (apixaban) daily  and atorvastatin 20 mg daily for secondary stroke prevention.  Discussed secondary stroke prevention measures and importance of close PCP follow up for aggressive stroke risk factor management  2. Orthostatic hypotension: Unknown etiology present over the past 10 years with recent worsening over the past 6 to 12 months.  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  Recommend further evaluation with a DaTscan for further assessment. Will recommend f/u visit with Dr. Leonie Man for further evaluation 3. Atrial fibrillation: On Eliquis 2.5 mg twice daily per cardiology 4. HLD: LDL goal <70. Recent LDL 99.  On atorvastatin 20 mg daily.  Request follow-up with PCP in the next 1 to 2 months for repeat lipid panel as well as ongoing prescribing of atorvastatin.    Follow up with Dr. Leonie Man in 3 months or call earlier if needed  CC:  [place] provider: Dr. Nevada Crane, Wendee Beavers, PA-C    I spent a prolonged 60 minutes of face-to-face and non-face-to-face time with patient.  This included previsit chart review, lab review, study review, order entry, electronic health record documentation, patient education regarding recent TIA and etiology, importance of managing stroke risk factors, orthostatic hypotension and possible causes and answered all other questions to patient satisfaction   Frann Rider, AGNP-BC  [facility] [address] [place], [place] [address]  Phone [phone] Fax [phone] Note: This document was prepared with digital dictation and possible smart phrase technology. Any transcriptional errors that result from this process are unintentional.

## 2020-08-20 NOTE — Patient Instructions (Signed)
Continue Eliquis (apixaban) daily  and atorvastatin  for secondary stroke prevention  Continue to follow up with PCP regarding cholesterol and blood pressure management  Maintain strict control of hypertension with blood pressure goal below 130/90 and cholesterol with LDL cholesterol (bad cholesterol) goal below 70 mg/dL.         Thank you for coming to see [place] at [facility]. I hope we have been able to provide you high quality care today.  You may receive a patient satisfaction survey over the next few weeks. We would appreciate your feedback and comments so that we may continue to improve ourselves and the health of our patients.

## 2020-08-21 ENCOUNTER — Inpatient Hospital Stay: Payer: PPO | Admitting: Physician Assistant

## 2020-08-23 ENCOUNTER — Telehealth (INDEPENDENT_AMBULATORY_CARE_PROVIDER_SITE_OTHER): Payer: PPO | Admitting: Physician Assistant

## 2020-08-23 DIAGNOSIS — F4322 Adjustment disorder with anxiety: Secondary | ICD-10-CM

## 2020-08-23 DIAGNOSIS — G459 Transient cerebral ischemic attack, unspecified: Secondary | ICD-10-CM

## 2020-08-23 DIAGNOSIS — I951 Orthostatic hypotension: Secondary | ICD-10-CM

## 2020-08-23 NOTE — Progress Notes (Signed)
Established patient visit   Patient: Bianca [name]   DOB: 07-21-1940   80 y.o. Female  MRN: [id number] Visit Date: 08/23/2020  Today's healthcare provider: Trinna Post, PA-C   Chief Complaint  Patient presents with  . Hospitalization Follow-up  I,Bosten Newstrom M Herson Prichard,acting as a scribe for Trinna Post, PA-C.,have documented all relevant documentation on the behalf of Trinna Post, PA-C,as directed by  Trinna Post, PA-C while in the presence of Trinna Post, PA-C.  Subjective    HPI  Follow up Hospitalization  Patient was admitted to [facility] on 07/15/2020 and discharged on 07/21/2020 for TIA.   She was treated for TIA (transient ischemic attack) . Treatment for this included CT scan, labs, EKG,IV. Telephone follow up was done on none She reports fair compliance with treatment. She reports this condition is improved. She has a UTI and a catheter in place. She has had this in place since her hospitalization.  She was discharged to a SNF with a catheter from the hospital. She was noted to have asymptomatic bacteruria. She has since been to [facility] on 08/20/2020 where she did not have an infection at that point. She had a spontaneous voiding trial which she failed. Catheter was reinserted and another UTI was found. She is taking Keflex currently for a UTI. Patient denies any pain with urination. She is to return for another voiding trial.   Daughter Candice reports home health is supposed to be coming out after Tuesday of this week, 08/20/2020. The home health agency was [facility].   Patient and her daughter have not been contacted by physcial therapist.  Reports her daughter [name] has been cooking meals and daughter is cooking as well. She has been drinking some boost frequently and has gained some weight.  She has a shower chair but she has not gotten in the shower and has been taking a sponge bath. Her bathroom is not equipped with safety equipment.    Her Buspar was discontinued and mirtazapine in the hospital due to concern for sedation. It seems she was started on effexor at the SNF.   She has been to see Dr. Saralyn Pilar at [facility] for a second opinion regarding her orthostatic hypotensions. She thinks she will continue to see him.   She has also seen [facility] on 08/20/2020 after TIA. She was instructed to continue Eliquis and statin for stroke prevention. Concern for possible parkinson's, advised to see attending at follow up.    Wt Readings from Last 3 Encounters:  08/20/20 105 lb (47.6 kg)  07/23/20 103 lb (46.7 kg)  07/16/20 100 lb 1.4 oz (45.4 kg)    ----------------------------------------------------------------------------------------- -       Medications: Outpatient Medications Prior to Visit  Medication Sig  . acetaminophen (TYLENOL) 500 MG tablet Take 1,000 mg by mouth every 6 (six) hours as needed (for headaches).  . ADVAIR DISKUS 100-50 MCG/DOSE AEPB INHALE 1 PUFF TWICE A DAY AS DIRECTED **RINSE MOUTH AFTER USE** (Patient taking differently: Inhale 1 puff into the lungs See admin instructions. INHALE 1 PUFF INTO THE LUNGS TWICE A DAY AS DIRECTED- **RINSE MOUTH AFTER USE**)  . atorvastatin (LIPITOR) 20 MG tablet Take 1 tablet (20 mg total) by mouth daily.  . Calcium Carb-Cholecalciferol (CALCIUM+D3 PO) Take 1 tablet by mouth daily.  . Cholecalciferol (VITAMIN D3) 50 MCG (2000 UT) TABS Take 2,000 Units by mouth daily.   Marland Kitchen ELIQUIS 2.5 MG TABS tablet TAKE 1 TABLET  BY MOUTH TWICE DAILY (Patient taking differently: Take 2.5 mg by mouth with breakfast, with lunch, and with evening meal.)  . ferrous gluconate (FERGON) 324 MG tablet Take 1 tablet (324 mg total) by mouth 2 (two) times daily with a meal.  . lactose free nutrition (BOOST) LIQD Take 237 mLs by mouth 2 (two) times daily between meals.  . midodrine (PROAMATINE) 5 MG tablet Take 1/2 tablet for systolic blood pressure between 120 - 160.  Take 1  tablet for systolic blood pressure less than 120. (Patient taking differently: Take 2.5-5 mg by mouth See admin instructions. Take 2.5 mg by mouth for systolic B/P between 122-482 and 5 mg for a systolic B/P less than 500)  . PROAIR HFA 108 (90 Base) MCG/ACT inhaler INHALE 1-2 PUFFS INTO THE LUNGS EVERY 4 HOURS AS NEEDED (Patient taking differently: Inhale 1-2 puffs into the lungs every 4 (four) hours as needed for wheezing or shortness of breath.)  . vitamin C (ASCORBIC ACID) 500 MG tablet Take 500 mg by mouth daily.   Marland Kitchen pyridostigmine (MESTINON) 60 MG tablet Take 0.5 tablets (30 mg total) by mouth in the morning and at bedtime. (Patient not taking: Reported on 08/23/2020)   No facility-administered medications prior to visit.    Review of Systems  Constitutional: Negative.   Respiratory: Negative.   Cardiovascular: Negative.   Hematological: Negative.        Objective    LMP  (LMP Unknown)     Physical Exam Constitutional:      Appearance: Normal appearance.  Pulmonary:     Effort: Pulmonary effort is normal. No respiratory distress.  Neurological:     Mental Status: She is alert and oriented to person, place, and time. Mental status is at baseline.  Psychiatric:        Mood and Affect: Mood normal.        Behavior: Behavior normal.       No results found for any visits on 08/23/20.  Assessment & Plan    1. TIA (transient ischemic attack)  Continue eliquis and statin. Will check lipid panel at f/u. Counseled this is for stroke prevention.   - Ambulatory referral to [facility]  2. Adjustment disorder with anxious mood  Daughter reports her depression medications were stopped in the hospital and was unsure who restarted them but now patient is on effexor instead of buspar and remeron. Concerned for sedation and fall risk, advised she can trial off medication and see if she needs it at this point. Patient did trial off and had worsening of mood and wants to continue  effexor. Counseled patient on sedation risk/fall risk with continuing this medication but she may if she feels it greatly helps her mood.   3. Orthostatic hypotension  Followed by cardiology.    Return in about 6 months (around 02/20/2021) for chronic .      ITrinna Post, PA-C, have reviewed all documentation for this visit. The documentation on 08/27/20 for the exam, diagnosis, procedures, and orders are all accurate and complete.  The entirety of the information documented in the History of Present Illness, Review of Systems and Physical Exam were personally obtained by me. Portions of this information were initially documented by [facility] and reviewed by me for thoroughness and accuracy.     Paulene Floor  [facility] [phone] (phone) [phone] (fax)  [place]

## 2020-08-26 ENCOUNTER — Encounter: Payer: Self-pay | Admitting: Adult Health

## 2020-08-26 ENCOUNTER — Encounter: Payer: Self-pay | Admitting: Physician Assistant

## 2020-08-26 DIAGNOSIS — F3289 Other specified depressive episodes: Secondary | ICD-10-CM

## 2020-08-27 MED ORDER — BUSPIRONE HCL 7.5 MG PO TABS
7.5000 mg | ORAL_TABLET | Freq: Two times a day (BID) | ORAL | 0 refills | Status: AC
Start: 2020-08-27 — End: 2020-11-25

## 2020-08-27 NOTE — Progress Notes (Signed)
I agree with the above plan 

## 2020-08-29 DIAGNOSIS — R338 Other retention of urine: Secondary | ICD-10-CM | POA: Diagnosis not present

## 2020-09-02 DIAGNOSIS — R339 Retention of urine, unspecified: Secondary | ICD-10-CM | POA: Diagnosis not present

## 2020-09-03 DIAGNOSIS — Z9012 Acquired absence of left breast and nipple: Secondary | ICD-10-CM | POA: Diagnosis not present

## 2020-09-03 DIAGNOSIS — F419 Anxiety disorder, unspecified: Secondary | ICD-10-CM | POA: Diagnosis not present

## 2020-09-03 DIAGNOSIS — R339 Retention of urine, unspecified: Secondary | ICD-10-CM | POA: Diagnosis not present

## 2020-09-03 DIAGNOSIS — I2510 Atherosclerotic heart disease of native coronary artery without angina pectoris: Secondary | ICD-10-CM | POA: Diagnosis not present

## 2020-09-03 DIAGNOSIS — E43 Unspecified severe protein-calorie malnutrition: Secondary | ICD-10-CM | POA: Diagnosis not present

## 2020-09-03 DIAGNOSIS — G8191 Hemiplegia, unspecified affecting right dominant side: Secondary | ICD-10-CM | POA: Diagnosis not present

## 2020-09-03 DIAGNOSIS — I480 Paroxysmal atrial fibrillation: Secondary | ICD-10-CM | POA: Diagnosis not present

## 2020-09-03 DIAGNOSIS — Z96641 Presence of right artificial hip joint: Secondary | ICD-10-CM | POA: Diagnosis not present

## 2020-09-03 DIAGNOSIS — Z8673 Personal history of transient ischemic attack (TIA), and cerebral infarction without residual deficits: Secondary | ICD-10-CM | POA: Diagnosis not present

## 2020-09-03 DIAGNOSIS — I951 Orthostatic hypotension: Secondary | ICD-10-CM | POA: Diagnosis not present

## 2020-09-03 DIAGNOSIS — Z9181 History of falling: Secondary | ICD-10-CM | POA: Diagnosis not present

## 2020-09-03 DIAGNOSIS — E785 Hyperlipidemia, unspecified: Secondary | ICD-10-CM | POA: Diagnosis not present

## 2020-09-03 DIAGNOSIS — M1990 Unspecified osteoarthritis, unspecified site: Secondary | ICD-10-CM | POA: Diagnosis not present

## 2020-09-03 DIAGNOSIS — Z85118 Personal history of other malignant neoplasm of bronchus and lung: Secondary | ICD-10-CM | POA: Diagnosis not present

## 2020-09-03 DIAGNOSIS — Z902 Acquired absence of lung [part of]: Secondary | ICD-10-CM | POA: Diagnosis not present

## 2020-09-03 DIAGNOSIS — I10 Essential (primary) hypertension: Secondary | ICD-10-CM | POA: Diagnosis not present

## 2020-09-03 DIAGNOSIS — Z87891 Personal history of nicotine dependence: Secondary | ICD-10-CM | POA: Diagnosis not present

## 2020-09-03 DIAGNOSIS — H9190 Unspecified hearing loss, unspecified ear: Secondary | ICD-10-CM | POA: Diagnosis not present

## 2020-09-03 DIAGNOSIS — Z9049 Acquired absence of other specified parts of digestive tract: Secondary | ICD-10-CM | POA: Diagnosis not present

## 2020-09-03 DIAGNOSIS — J449 Chronic obstructive pulmonary disease, unspecified: Secondary | ICD-10-CM | POA: Diagnosis not present

## 2020-09-03 DIAGNOSIS — Z7951 Long term (current) use of inhaled steroids: Secondary | ICD-10-CM | POA: Diagnosis not present

## 2020-09-03 DIAGNOSIS — Z87440 Personal history of urinary (tract) infections: Secondary | ICD-10-CM | POA: Diagnosis not present

## 2020-09-06 DIAGNOSIS — R3914 Feeling of incomplete bladder emptying: Secondary | ICD-10-CM | POA: Diagnosis not present

## 2020-09-09 ENCOUNTER — Telehealth: Payer: Self-pay | Admitting: Physician Assistant

## 2020-09-09 DIAGNOSIS — I10 Essential (primary) hypertension: Secondary | ICD-10-CM | POA: Diagnosis not present

## 2020-09-09 DIAGNOSIS — G8191 Hemiplegia, unspecified affecting right dominant side: Secondary | ICD-10-CM | POA: Diagnosis not present

## 2020-09-09 DIAGNOSIS — Z9049 Acquired absence of other specified parts of digestive tract: Secondary | ICD-10-CM | POA: Diagnosis not present

## 2020-09-09 DIAGNOSIS — E43 Unspecified severe protein-calorie malnutrition: Secondary | ICD-10-CM | POA: Diagnosis not present

## 2020-09-09 DIAGNOSIS — R339 Retention of urine, unspecified: Secondary | ICD-10-CM | POA: Diagnosis not present

## 2020-09-09 DIAGNOSIS — H9190 Unspecified hearing loss, unspecified ear: Secondary | ICD-10-CM | POA: Diagnosis not present

## 2020-09-09 DIAGNOSIS — M1990 Unspecified osteoarthritis, unspecified site: Secondary | ICD-10-CM | POA: Diagnosis not present

## 2020-09-09 DIAGNOSIS — Z9181 History of falling: Secondary | ICD-10-CM | POA: Diagnosis not present

## 2020-09-09 DIAGNOSIS — Z87891 Personal history of nicotine dependence: Secondary | ICD-10-CM | POA: Diagnosis not present

## 2020-09-09 DIAGNOSIS — F419 Anxiety disorder, unspecified: Secondary | ICD-10-CM | POA: Diagnosis not present

## 2020-09-09 DIAGNOSIS — Z902 Acquired absence of lung [part of]: Secondary | ICD-10-CM | POA: Diagnosis not present

## 2020-09-09 DIAGNOSIS — Z9012 Acquired absence of left breast and nipple: Secondary | ICD-10-CM | POA: Diagnosis not present

## 2020-09-09 DIAGNOSIS — I951 Orthostatic hypotension: Secondary | ICD-10-CM | POA: Diagnosis not present

## 2020-09-09 DIAGNOSIS — J449 Chronic obstructive pulmonary disease, unspecified: Secondary | ICD-10-CM | POA: Diagnosis not present

## 2020-09-09 DIAGNOSIS — E785 Hyperlipidemia, unspecified: Secondary | ICD-10-CM | POA: Diagnosis not present

## 2020-09-09 DIAGNOSIS — Z7951 Long term (current) use of inhaled steroids: Secondary | ICD-10-CM | POA: Diagnosis not present

## 2020-09-09 DIAGNOSIS — Z8673 Personal history of transient ischemic attack (TIA), and cerebral infarction without residual deficits: Secondary | ICD-10-CM | POA: Diagnosis not present

## 2020-09-09 DIAGNOSIS — I2510 Atherosclerotic heart disease of native coronary artery without angina pectoris: Secondary | ICD-10-CM | POA: Diagnosis not present

## 2020-09-09 DIAGNOSIS — Z85118 Personal history of other malignant neoplasm of bronchus and lung: Secondary | ICD-10-CM | POA: Diagnosis not present

## 2020-09-09 DIAGNOSIS — Z96641 Presence of right artificial hip joint: Secondary | ICD-10-CM | POA: Diagnosis not present

## 2020-09-09 DIAGNOSIS — I480 Paroxysmal atrial fibrillation: Secondary | ICD-10-CM | POA: Diagnosis not present

## 2020-09-09 DIAGNOSIS — Z87440 Personal history of urinary (tract) infections: Secondary | ICD-10-CM | POA: Diagnosis not present

## 2020-09-09 NOTE — Progress Notes (Signed)
Established patient visit   Patient: Bianca [name]   DOB: April 27, 1941   80 y.o. Female  MRN: [id number] Visit Date: 09/10/2020  Today's healthcare provider: Trinna Post, PA-C   Chief Complaint  Patient presents with  . Dysuria   Subjective    HPI  Patient presents today for possible urinary tract infection for 2 day now. Patient has recently been discharged from a long hospitalization with a foley catheter. She has had a urinary tract intermittently since then, most recently documented 07/16/2020 Klebsiella pneumoniae. This was removed for a spontaneous voiding trial, however patient failed this. Currently she is in and out cathing twice daily.  Daughter reports that patient urine is cloudy, mucus, dark yellow and has odor. Denies fevers, chills, nausea, vomiting, back pain, abdominal pain.      Medications: Outpatient Medications Prior to Visit  Medication Sig  . acetaminophen (TYLENOL) 500 MG tablet Take 1,000 mg by mouth every 6 (six) hours as needed (for headaches).  . ADVAIR DISKUS 100-50 MCG/DOSE AEPB INHALE 1 PUFF TWICE A DAY AS DIRECTED **RINSE MOUTH AFTER USE** (Patient taking differently: Inhale 1 puff into the lungs See admin instructions. INHALE 1 PUFF INTO THE LUNGS TWICE A DAY AS DIRECTED- **RINSE MOUTH AFTER USE**)  . atorvastatin (LIPITOR) 20 MG tablet Take 1 tablet (20 mg total) by mouth daily.  . busPIRone (BUSPAR) 7.5 MG tablet Take 1 tablet (7.5 mg total) by mouth 2 (two) times daily.  . Calcium Carb-Cholecalciferol (CALCIUM+D3 PO) Take 1 tablet by mouth daily.  . Cholecalciferol (VITAMIN D3) 50 MCG (2000 UT) TABS Take 2,000 Units by mouth daily.   Marland Kitchen ELIQUIS 2.5 MG TABS tablet TAKE 1 TABLET BY MOUTH TWICE DAILY (Patient taking differently: Take 2.5 mg by mouth with breakfast, with lunch, and with evening meal.)  . ferrous gluconate (FERGON) 324 MG tablet Take 1 tablet (324 mg total) by mouth 2 (two) times daily with a meal.  . lactose free nutrition  (BOOST) LIQD Take 237 mLs by mouth 2 (two) times daily between meals.  . midodrine (PROAMATINE) 5 MG tablet Take 1/2 tablet for systolic blood pressure between 120 - 160.  Take 1 tablet for systolic blood pressure less than 120. (Patient taking differently: Take 2.5-5 mg by mouth See admin instructions. Take 2.5 mg by mouth for systolic B/P between 195-093 and 5 mg for a systolic B/P less than 267)  . PROAIR HFA 108 (90 Base) MCG/ACT inhaler INHALE 1-2 PUFFS INTO THE LUNGS EVERY 4 HOURS AS NEEDED (Patient taking differently: Inhale 1-2 puffs into the lungs every 4 (four) hours as needed for wheezing or shortness of breath.)  . pyridostigmine (MESTINON) 60 MG tablet Take 0.5 tablets (30 mg total) by mouth in the morning and at bedtime.  . vitamin C (ASCORBIC ACID) 500 MG tablet Take 500 mg by mouth daily.    No facility-administered medications prior to visit.    Review of Systems     Objective    BP 112/70 (BP Location: Right Arm, Patient Position: Sitting, Cuff Size: Normal)   Pulse (!) 117   Temp 97.6 F (36.4 C) (Oral)   Wt 105 lb 1.6 oz (47.7 kg)   LMP  (LMP Unknown)   SpO2 99%   BMI 17.49 kg/m     Physical Exam Constitutional:      General: She is not in acute distress.    Appearance: Normal appearance. She is well-developed and well-nourished. She is not diaphoretic.  Cardiovascular:  Rate and Rhythm: Normal rate and regular rhythm.  Pulmonary:     Effort: Pulmonary effort is normal. No respiratory distress.     Breath sounds: Normal breath sounds.  Abdominal:     General: Bowel sounds are normal. There is no distension.     Palpations: Abdomen is soft.     Tenderness: There is no abdominal tenderness. There is no CVA tenderness, guarding or rebound.  Skin:    General: Skin is warm and dry.  Neurological:     Mental Status: She is alert and oriented to person, place, and time.  Psychiatric:        Mood and Affect: Mood and affect and mood normal.        Behavior:  Behavior normal.       No results found for any visits on 09/10/20.  Assessment & Plan    1. Complicated UTI (urinary tract infection)  Unable to give urine sample below. Treat empirically as below. Have sent home with collection container to provide sample if symptoms not improving.   - ciprofloxacin (CIPRO) 250 MG tablet; Take 1 tablet (250 mg total) by mouth 2 (two) times daily.  Dispense: 6 tablet; Refill: 0   Return if symptoms worsen or fail to improve.      ITrinna Post, PA-C, have reviewed all documentation for this visit. The documentation on 09/11/20 for the exam, diagnosis, procedures, and orders are all accurate and complete.  The entirety of the information documented in the History of Present Illness, Review of Systems and Physical Exam were personally obtained by me. Portions of this information were initially documented by [facility] and reviewed by me for thoroughness and accuracy.     Paulene Floor  [facility] [phone] (phone) [phone] (fax)  [place]

## 2020-09-09 NOTE — Telephone Encounter (Signed)
[name] called asking for skilled nursing eval for UTI's and external cath.  CB#  [phone]

## 2020-09-09 NOTE — Telephone Encounter (Signed)
Are they requesting me to place a home health nursing referral to evaluate her catheter? I am little confused by the message.

## 2020-09-10 ENCOUNTER — Ambulatory Visit (INDEPENDENT_AMBULATORY_CARE_PROVIDER_SITE_OTHER): Payer: PPO | Admitting: Physician Assistant

## 2020-09-10 ENCOUNTER — Other Ambulatory Visit: Payer: Self-pay

## 2020-09-10 ENCOUNTER — Ambulatory Visit: Payer: PPO | Admitting: Internal Medicine

## 2020-09-10 ENCOUNTER — Encounter: Payer: Self-pay | Admitting: Physician Assistant

## 2020-09-10 VITALS — BP 112/70 | HR 117 | Temp 97.6°F | Wt 105.1 lb

## 2020-09-10 DIAGNOSIS — N390 Urinary tract infection, site not specified: Secondary | ICD-10-CM

## 2020-09-10 DIAGNOSIS — N3000 Acute cystitis without hematuria: Secondary | ICD-10-CM

## 2020-09-10 MED ORDER — CIPROFLOXACIN HCL 250 MG PO TABS: 250.0000 mg | ORAL_TABLET | Freq: Two times a day (BID) | ORAL | 0 refills | Status: DC

## 2020-09-10 NOTE — Telephone Encounter (Signed)
Called [facility]

## 2020-09-10 NOTE — Telephone Encounter (Signed)
Returned Tillie Rung call w/ [facility] home health and advised her via vm that the order was placed.

## 2020-09-10 NOTE — Patient Instructions (Signed)
Urinary Tract Infection, Adult  A urinary tract infection (UTI) is an infection of any part of the urinary tract. The urinary tract includes the kidneys, ureters, bladder, and urethra. These organs make, store, and get rid of urine in the body. An upper UTI affects the ureters and kidneys. A lower UTI affects the bladder and urethra. What are the causes? Most urinary tract infections are caused by bacteria in your genital area around your urethra, where urine leaves your body. These bacteria grow and cause inflammation of your urinary tract. What increases the risk? You are more likely to develop this condition if:  You have a urinary catheter that stays in place.  You are not able to control when you urinate or have a bowel movement (incontinence).  You are female and you: ? Use a spermicide or diaphragm for birth control. ? Have low estrogen levels. ? Are pregnant.  You have certain genes that increase your risk.  You are sexually active.  You take antibiotic medicines.  You have a condition that causes your flow of urine to slow down, such as: ? An enlarged prostate, if you are female. ? Blockage in your urethra. ? A kidney stone. ? A nerve condition that affects your bladder control (neurogenic bladder). ? Not getting enough to drink, or not urinating often.  You have certain medical conditions, such as: ? Diabetes. ? A weak disease-fighting system (immunesystem). ? Sickle cell disease. ? Gout. ? Spinal cord injury. What are the signs or symptoms? Symptoms of this condition include:  Needing to urinate right away (urgency).  Frequent urination. This may include small amounts of urine each time you urinate.  Pain or burning with urination.  Blood in the urine.  Urine that smells bad or unusual.  Trouble urinating.  Cloudy urine.  Vaginal discharge, if you are female.  Pain in the abdomen or the lower back. You may also have:  Vomiting or a decreased  appetite.  Confusion.  Irritability or tiredness.  A fever or chills.  Diarrhea. The first symptom in older adults may be confusion. In some cases, they may not have any symptoms until the infection has worsened. How is this diagnosed? This condition is diagnosed based on your medical history and a physical exam. You may also have other tests, including:  Urine tests.  Blood tests.  Tests for STIs (sexually transmitted infections). If you have had more than one UTI, a cystoscopy or imaging studies may be done to determine the cause of the infections. How is this treated? Treatment for this condition includes:  Antibiotic medicine.  Over-the-counter medicines to treat discomfort.  Drinking enough water to stay hydrated. If you have frequent infections or have other conditions such as a kidney stone, you may need to see a health care provider who specializes in the urinary tract (urologist). In rare cases, urinary tract infections can cause sepsis. Sepsis is a life-threatening condition that occurs when the body responds to an infection. Sepsis is treated in the hospital with IV antibiotics, fluids, and other medicines. Follow these instructions at home: Medicines  Take over-the-counter and prescription medicines only as told by your health care provider.  If you were prescribed an antibiotic medicine, take it as told by your health care provider. Do not stop using the antibiotic even if you start to feel better. General instructions  Make sure you: ? Empty your bladder often and completely. Do not hold urine for long periods of time. ? Empty your bladder after   sex. ? Wipe from front to back after urinating or having a bowel movement if you are female. Use each tissue only one time when you wipe.  Drink enough fluid to keep your urine pale yellow.  Keep all follow-up visits. This is important.   Contact a health care provider if:  Your symptoms do not get better after 1-2  days.  Your symptoms go away and then return. Get help right away if:  You have severe pain in your back or your lower abdomen.  You have a fever or chills.  You have nausea or vomiting. Summary  A urinary tract infection (UTI) is an infection of any part of the urinary tract, which includes the kidneys, ureters, bladder, and urethra.  Most urinary tract infections are caused by bacteria in your genital area.  Treatment for this condition often includes antibiotic medicines.  If you were prescribed an antibiotic medicine, take it as told by your health care provider. Do not stop using the antibiotic even if you start to feel better.  Keep all follow-up visits. This is important. This information is not intended to replace advice given to you by your health care provider. Make sure you discuss any questions you have with your health care provider. Document Revised: 02/16/2020 Document Reviewed: 02/16/2020 Elsevier Patient Education  2021 Elsevier Inc.  

## 2020-09-10 NOTE — Telephone Encounter (Signed)
Bianca [name] has made contact returning phone call Please contact at [phone]

## 2020-09-11 DIAGNOSIS — N390 Urinary tract infection, site not specified: Secondary | ICD-10-CM | POA: Diagnosis not present

## 2020-09-11 LAB — POCT URINALYSIS DIPSTICK
Bilirubin, UA: NEGATIVE
Blood, UA: NEGATIVE
Glucose, UA: NEGATIVE
Ketones, UA: NEGATIVE
Nitrite, UA: POSITIVE
Protein, UA: NEGATIVE
Spec Grav, UA: 1.015 (ref 1.010–1.025)
Urobilinogen, UA: 0.2 E.U./dL
pH, UA: 6.5 (ref 5.0–8.0)

## 2020-09-11 NOTE — Addendum Note (Signed)
Addended by: Dan Maker on: 09/11/2020 03:10 PM   Modules accepted: Orders

## 2020-09-12 ENCOUNTER — Encounter: Payer: Self-pay | Admitting: Physician Assistant

## 2020-09-13 LAB — URINE CULTURE

## 2020-09-17 ENCOUNTER — Other Ambulatory Visit: Payer: Self-pay | Admitting: Internal Medicine

## 2020-09-18 ENCOUNTER — Other Ambulatory Visit: Payer: Self-pay

## 2020-09-18 DIAGNOSIS — E785 Hyperlipidemia, unspecified: Secondary | ICD-10-CM | POA: Diagnosis not present

## 2020-09-18 DIAGNOSIS — G8191 Hemiplegia, unspecified affecting right dominant side: Secondary | ICD-10-CM | POA: Diagnosis not present

## 2020-09-18 DIAGNOSIS — R339 Retention of urine, unspecified: Secondary | ICD-10-CM | POA: Diagnosis not present

## 2020-09-18 DIAGNOSIS — Z87891 Personal history of nicotine dependence: Secondary | ICD-10-CM | POA: Diagnosis not present

## 2020-09-18 DIAGNOSIS — I10 Essential (primary) hypertension: Secondary | ICD-10-CM | POA: Diagnosis not present

## 2020-09-18 DIAGNOSIS — Z9049 Acquired absence of other specified parts of digestive tract: Secondary | ICD-10-CM | POA: Diagnosis not present

## 2020-09-18 DIAGNOSIS — J449 Chronic obstructive pulmonary disease, unspecified: Secondary | ICD-10-CM | POA: Diagnosis not present

## 2020-09-18 DIAGNOSIS — I2510 Atherosclerotic heart disease of native coronary artery without angina pectoris: Secondary | ICD-10-CM | POA: Diagnosis not present

## 2020-09-18 DIAGNOSIS — E43 Unspecified severe protein-calorie malnutrition: Secondary | ICD-10-CM | POA: Diagnosis not present

## 2020-09-18 DIAGNOSIS — Z87440 Personal history of urinary (tract) infections: Secondary | ICD-10-CM | POA: Diagnosis not present

## 2020-09-18 DIAGNOSIS — M1990 Unspecified osteoarthritis, unspecified site: Secondary | ICD-10-CM | POA: Diagnosis not present

## 2020-09-18 DIAGNOSIS — Z7951 Long term (current) use of inhaled steroids: Secondary | ICD-10-CM | POA: Diagnosis not present

## 2020-09-18 DIAGNOSIS — H9190 Unspecified hearing loss, unspecified ear: Secondary | ICD-10-CM | POA: Diagnosis not present

## 2020-09-18 DIAGNOSIS — I480 Paroxysmal atrial fibrillation: Secondary | ICD-10-CM | POA: Diagnosis not present

## 2020-09-18 DIAGNOSIS — Z8673 Personal history of transient ischemic attack (TIA), and cerebral infarction without residual deficits: Secondary | ICD-10-CM | POA: Diagnosis not present

## 2020-09-18 DIAGNOSIS — Z96641 Presence of right artificial hip joint: Secondary | ICD-10-CM | POA: Diagnosis not present

## 2020-09-18 DIAGNOSIS — Z85118 Personal history of other malignant neoplasm of bronchus and lung: Secondary | ICD-10-CM | POA: Diagnosis not present

## 2020-09-18 DIAGNOSIS — Z9181 History of falling: Secondary | ICD-10-CM | POA: Diagnosis not present

## 2020-09-18 DIAGNOSIS — Z902 Acquired absence of lung [part of]: Secondary | ICD-10-CM | POA: Diagnosis not present

## 2020-09-18 DIAGNOSIS — F419 Anxiety disorder, unspecified: Secondary | ICD-10-CM | POA: Diagnosis not present

## 2020-09-18 DIAGNOSIS — I951 Orthostatic hypotension: Secondary | ICD-10-CM | POA: Diagnosis not present

## 2020-09-18 DIAGNOSIS — Z9012 Acquired absence of left breast and nipple: Secondary | ICD-10-CM | POA: Diagnosis not present

## 2020-09-18 MED ORDER — ATORVASTATIN CALCIUM 20 MG PO TABS: 20.0000 mg | ORAL_TABLET | Freq: Every day | ORAL | 0 refills | Status: DC

## 2020-09-19 DIAGNOSIS — I951 Orthostatic hypotension: Secondary | ICD-10-CM | POA: Diagnosis not present

## 2020-09-19 DIAGNOSIS — I480 Paroxysmal atrial fibrillation: Secondary | ICD-10-CM | POA: Diagnosis not present

## 2020-09-19 DIAGNOSIS — J984 Other disorders of lung: Secondary | ICD-10-CM | POA: Diagnosis not present

## 2020-09-19 DIAGNOSIS — J441 Chronic obstructive pulmonary disease with (acute) exacerbation: Secondary | ICD-10-CM | POA: Diagnosis not present

## 2020-09-27 DIAGNOSIS — R339 Retention of urine, unspecified: Secondary | ICD-10-CM | POA: Diagnosis not present

## 2020-10-02 ENCOUNTER — Telehealth: Payer: Self-pay

## 2020-10-02 NOTE — Telephone Encounter (Signed)
Copied from [place] [id number]. Topic: General - Other >> Oct 02, 2020  2:54 PM Pawlus, Brayton Layman A wrote: Reason for CRM: Pt wanted some antibiotics called in due to possible UTI or infection. Please advise.

## 2020-10-02 NOTE — Telephone Encounter (Signed)
Last UTI we treated was from 09/11/2020. She would need a virtual visit at least to assess. She can do urine drop off with virtual visit.

## 2020-10-03 ENCOUNTER — Encounter: Payer: Self-pay | Admitting: Adult Health

## 2020-10-03 ENCOUNTER — Telehealth: Payer: Self-pay | Admitting: *Deleted

## 2020-10-03 NOTE — Telephone Encounter (Signed)
Patient has not heard about scheduling DAT scan. Called Lovena Le, nuclear med, [facility] and asked her to call patient to schedule DAT scan.

## 2020-10-03 NOTE — Telephone Encounter (Signed)
Patient's daughter [name]) was advised and scheduled appointment for tomorrow, 10/04/20 @ 11:20 AM.

## 2020-10-04 ENCOUNTER — Telehealth: Payer: Self-pay | Admitting: Physician Assistant

## 2020-10-04 DIAGNOSIS — N390 Urinary tract infection, site not specified: Secondary | ICD-10-CM | POA: Diagnosis not present

## 2020-10-10 DIAGNOSIS — I480 Paroxysmal atrial fibrillation: Secondary | ICD-10-CM | POA: Diagnosis not present

## 2020-10-10 DIAGNOSIS — Z85118 Personal history of other malignant neoplasm of bronchus and lung: Secondary | ICD-10-CM | POA: Diagnosis not present

## 2020-10-10 DIAGNOSIS — N139 Obstructive and reflux uropathy, unspecified: Secondary | ICD-10-CM | POA: Diagnosis not present

## 2020-10-10 DIAGNOSIS — N183 Chronic kidney disease, stage 3 unspecified: Secondary | ICD-10-CM | POA: Diagnosis not present

## 2020-10-10 DIAGNOSIS — I951 Orthostatic hypotension: Secondary | ICD-10-CM | POA: Diagnosis not present

## 2020-10-14 ENCOUNTER — Other Ambulatory Visit: Payer: Self-pay | Admitting: Nephrology

## 2020-10-14 DIAGNOSIS — Z09 Encounter for follow-up examination after completed treatment for conditions other than malignant neoplasm: Secondary | ICD-10-CM | POA: Diagnosis not present

## 2020-10-14 DIAGNOSIS — Z87440 Personal history of urinary (tract) infections: Secondary | ICD-10-CM | POA: Diagnosis not present

## 2020-10-14 DIAGNOSIS — N183 Chronic kidney disease, stage 3 unspecified: Secondary | ICD-10-CM

## 2020-10-14 NOTE — Telephone Encounter (Signed)
[facility] records were faxed on 10/04/20

## 2020-10-18 ENCOUNTER — Other Ambulatory Visit: Payer: Self-pay | Admitting: Physician Assistant

## 2020-10-18 NOTE — Telephone Encounter (Signed)
Future visit in 4 months

## 2020-10-23 ENCOUNTER — Ambulatory Visit: Payer: PPO | Admitting: Internal Medicine

## 2020-10-24 ENCOUNTER — Telehealth: Payer: Self-pay | Admitting: Internal Medicine

## 2020-10-24 ENCOUNTER — Telehealth: Payer: Self-pay | Admitting: Physician Assistant

## 2020-10-24 NOTE — Telephone Encounter (Signed)
Duffy Rhody, NP  is calling from [facility], is calling to report the pt BP was 190/90 and 198/100 heart rate stayed in the 70s next appt is 11/04/20. Patient was not symptomatic.  Theadora Rama is not with the pt at this time. CB- [phone] number

## 2020-10-24 NOTE — Telephone Encounter (Signed)
Spoke with the patient and she reports that she had not taken her BP med when her blood pressure was checked. She reports that she feels fine and her BP is "doing well". She denies any symptoms. Attempted to make a follow up appt with new provider, and she reports that she will call back and schedule. She may see someone else not within the practice.

## 2020-10-24 NOTE — Telephone Encounter (Signed)
Brandi from [facility] just wanted to make [place] aware that the patient BP was 198/90 and when she check it again it was 198/100. Velna Hatchet stated if you have any further question she can be reach on her cell phone at [phone] or office number [phone]

## 2020-10-24 NOTE — Telephone Encounter (Signed)
Returned call and advised it appeared Pt was now being followed by [facility].  No upcoming appointments scheduled with Dr. Lovena Le.  Brandi will call PCP.

## 2020-10-25 DIAGNOSIS — M1990 Unspecified osteoarthritis, unspecified site: Secondary | ICD-10-CM | POA: Diagnosis not present

## 2020-10-25 DIAGNOSIS — Z9181 History of falling: Secondary | ICD-10-CM | POA: Diagnosis not present

## 2020-10-25 DIAGNOSIS — Z87891 Personal history of nicotine dependence: Secondary | ICD-10-CM | POA: Diagnosis not present

## 2020-10-25 DIAGNOSIS — I480 Paroxysmal atrial fibrillation: Secondary | ICD-10-CM | POA: Diagnosis not present

## 2020-10-25 DIAGNOSIS — E43 Unspecified severe protein-calorie malnutrition: Secondary | ICD-10-CM | POA: Diagnosis not present

## 2020-10-25 DIAGNOSIS — I10 Essential (primary) hypertension: Secondary | ICD-10-CM | POA: Diagnosis not present

## 2020-10-25 DIAGNOSIS — E785 Hyperlipidemia, unspecified: Secondary | ICD-10-CM | POA: Diagnosis not present

## 2020-10-25 DIAGNOSIS — H9190 Unspecified hearing loss, unspecified ear: Secondary | ICD-10-CM | POA: Diagnosis not present

## 2020-10-25 DIAGNOSIS — Z85118 Personal history of other malignant neoplasm of bronchus and lung: Secondary | ICD-10-CM | POA: Diagnosis not present

## 2020-10-25 DIAGNOSIS — Z8673 Personal history of transient ischemic attack (TIA), and cerebral infarction without residual deficits: Secondary | ICD-10-CM | POA: Diagnosis not present

## 2020-10-25 DIAGNOSIS — Z87440 Personal history of urinary (tract) infections: Secondary | ICD-10-CM | POA: Diagnosis not present

## 2020-10-25 DIAGNOSIS — F419 Anxiety disorder, unspecified: Secondary | ICD-10-CM | POA: Diagnosis not present

## 2020-10-25 DIAGNOSIS — Z902 Acquired absence of lung [part of]: Secondary | ICD-10-CM | POA: Diagnosis not present

## 2020-10-25 DIAGNOSIS — Z96641 Presence of right artificial hip joint: Secondary | ICD-10-CM | POA: Diagnosis not present

## 2020-10-25 DIAGNOSIS — Z9049 Acquired absence of other specified parts of digestive tract: Secondary | ICD-10-CM | POA: Diagnosis not present

## 2020-10-25 DIAGNOSIS — G8191 Hemiplegia, unspecified affecting right dominant side: Secondary | ICD-10-CM | POA: Diagnosis not present

## 2020-10-25 DIAGNOSIS — I951 Orthostatic hypotension: Secondary | ICD-10-CM | POA: Diagnosis not present

## 2020-10-25 DIAGNOSIS — I2510 Atherosclerotic heart disease of native coronary artery without angina pectoris: Secondary | ICD-10-CM | POA: Diagnosis not present

## 2020-10-25 DIAGNOSIS — R339 Retention of urine, unspecified: Secondary | ICD-10-CM | POA: Diagnosis not present

## 2020-10-25 DIAGNOSIS — J449 Chronic obstructive pulmonary disease, unspecified: Secondary | ICD-10-CM | POA: Diagnosis not present

## 2020-10-25 DIAGNOSIS — Z9012 Acquired absence of left breast and nipple: Secondary | ICD-10-CM | POA: Diagnosis not present

## 2020-10-25 DIAGNOSIS — Z7951 Long term (current) use of inhaled steroids: Secondary | ICD-10-CM | POA: Diagnosis not present

## 2020-10-30 DIAGNOSIS — Z87891 Personal history of nicotine dependence: Secondary | ICD-10-CM | POA: Diagnosis not present

## 2020-10-30 DIAGNOSIS — C3492 Malignant neoplasm of unspecified part of left bronchus or lung: Secondary | ICD-10-CM | POA: Diagnosis not present

## 2020-10-30 DIAGNOSIS — Z902 Acquired absence of lung [part of]: Secondary | ICD-10-CM | POA: Diagnosis not present

## 2020-10-30 DIAGNOSIS — C3432 Malignant neoplasm of lower lobe, left bronchus or lung: Secondary | ICD-10-CM | POA: Diagnosis not present

## 2020-10-31 ENCOUNTER — Other Ambulatory Visit: Payer: Self-pay | Admitting: Physician Assistant

## 2020-10-31 DIAGNOSIS — J40 Bronchitis, not specified as acute or chronic: Secondary | ICD-10-CM

## 2020-10-31 DIAGNOSIS — R3914 Feeling of incomplete bladder emptying: Secondary | ICD-10-CM | POA: Diagnosis not present

## 2020-10-31 NOTE — Telephone Encounter (Signed)
Requested medication (s) are due for refill today: yes  Requested medication (s) are on the active medication list: yes  Last refill:  03/22/20 #60 5 refills  Future visit scheduled: yes  Notes to clinic:  not authorized to refill

## 2020-11-01 DIAGNOSIS — R339 Retention of urine, unspecified: Secondary | ICD-10-CM | POA: Diagnosis not present

## 2020-11-08 DIAGNOSIS — R3914 Feeling of incomplete bladder emptying: Secondary | ICD-10-CM | POA: Diagnosis not present

## 2020-11-08 DIAGNOSIS — N3020 Other chronic cystitis without hematuria: Secondary | ICD-10-CM | POA: Diagnosis not present

## 2020-11-18 DIAGNOSIS — Z03818 Encounter for observation for suspected exposure to other biological agents ruled out: Secondary | ICD-10-CM | POA: Diagnosis not present

## 2020-11-18 DIAGNOSIS — Z20822 Contact with and (suspected) exposure to covid-19: Secondary | ICD-10-CM | POA: Diagnosis not present

## 2020-11-20 ENCOUNTER — Emergency Department: Payer: PPO

## 2020-11-20 ENCOUNTER — Other Ambulatory Visit: Payer: Self-pay

## 2020-11-20 ENCOUNTER — Emergency Department
Admission: EM | Admit: 2020-11-20 | Discharge: 2020-11-20 | Disposition: A | Discharge status: Home / Self Care | Payer: PPO | Attending: Emergency Medicine | Admitting: Emergency Medicine

## 2020-11-20 DIAGNOSIS — R410 Disorientation, unspecified: Secondary | ICD-10-CM | POA: Diagnosis not present

## 2020-11-20 DIAGNOSIS — G319 Degenerative disease of nervous system, unspecified: Secondary | ICD-10-CM | POA: Diagnosis not present

## 2020-11-20 DIAGNOSIS — R4020 Unspecified coma: Secondary | ICD-10-CM | POA: Diagnosis not present

## 2020-11-20 DIAGNOSIS — R4182 Altered mental status, unspecified: Secondary | ICD-10-CM | POA: Diagnosis not present

## 2020-11-20 DIAGNOSIS — I6782 Cerebral ischemia: Secondary | ICD-10-CM | POA: Diagnosis not present

## 2020-11-20 DIAGNOSIS — R29818 Other symptoms and signs involving the nervous system: Secondary | ICD-10-CM | POA: Diagnosis not present

## 2020-11-20 DIAGNOSIS — Z7901 Long term (current) use of anticoagulants: Secondary | ICD-10-CM | POA: Diagnosis not present

## 2020-11-20 DIAGNOSIS — U071 COVID-19: Secondary | ICD-10-CM | POA: Diagnosis not present

## 2020-11-20 DIAGNOSIS — R55 Syncope and collapse: Secondary | ICD-10-CM | POA: Diagnosis not present

## 2020-11-20 DIAGNOSIS — I313 Pericardial effusion (noninflammatory): Secondary | ICD-10-CM | POA: Diagnosis not present

## 2020-11-20 DIAGNOSIS — J3489 Other specified disorders of nose and nasal sinuses: Secondary | ICD-10-CM | POA: Diagnosis not present

## 2020-11-20 DIAGNOSIS — R531 Weakness: Secondary | ICD-10-CM | POA: Diagnosis not present

## 2020-11-20 DIAGNOSIS — J90 Pleural effusion, not elsewhere classified: Secondary | ICD-10-CM | POA: Diagnosis not present

## 2020-11-20 DIAGNOSIS — R404 Transient alteration of awareness: Secondary | ICD-10-CM | POA: Diagnosis not present

## 2020-11-20 DIAGNOSIS — R079 Chest pain, unspecified: Secondary | ICD-10-CM | POA: Diagnosis not present

## 2020-11-20 DIAGNOSIS — R0902 Hypoxemia: Secondary | ICD-10-CM | POA: Diagnosis not present

## 2020-11-20 LAB — COMPREHENSIVE METABOLIC PANEL
ALT: 19 U/L (ref 0–44)
AST: 30 U/L (ref 15–41)
Albumin: 3.4 g/dL — ABNORMAL LOW (ref 3.5–5.0)
Alkaline Phosphatase: 65 U/L (ref 38–126)
Anion gap: 8 (ref 5–15)
BUN: 18 mg/dL (ref 8–23)
CO2: 23 mmol/L (ref 22–32)
Calcium: 8.7 mg/dL — ABNORMAL LOW (ref 8.9–10.3)
Chloride: 104 mmol/L (ref 98–111)
Creatinine, Ser: 1.31 mg/dL — ABNORMAL HIGH (ref 0.44–1.00)
GFR, Estimated: 41 mL/min — ABNORMAL LOW (ref >60)
Glucose, Bld: 115 mg/dL — ABNORMAL HIGH (ref 70–99)
Potassium: 3.6 mmol/L (ref 3.5–5.1)
Sodium: 135 mmol/L (ref 135–145)
Total Bilirubin: 0.6 mg/dL (ref 0.3–1.2)
Total Protein: 6.7 g/dL (ref 6.5–8.1)

## 2020-11-20 LAB — DIFFERENTIAL
Abs Immature Granulocytes: 0.02 10*3/uL (ref 0.00–0.07)
Basophils Absolute: 0 10*3/uL (ref 0.0–0.1)
Basophils Relative: 1 %
Eosinophils Absolute: 0 10*3/uL (ref 0.0–0.5)
Eosinophils Relative: 0 %
Immature Granulocytes: 0 %
Lymphocytes Relative: 22 %
Lymphs Abs: 1.3 10*3/uL (ref 0.7–4.0)
Monocytes Absolute: 0.8 10*3/uL (ref 0.1–1.0)
Monocytes Relative: 13 %
Neutro Abs: 3.9 10*3/uL (ref 1.7–7.7)
Neutrophils Relative %: 64 %

## 2020-11-20 LAB — APTT: aPTT: 35 seconds (ref 24–36)

## 2020-11-20 LAB — URINALYSIS, COMPLETE (UACMP) WITH MICROSCOPIC
Bacteria, UA: NONE SEEN
Bilirubin Urine: NEGATIVE
Glucose, UA: NEGATIVE mg/dL
Hgb urine dipstick: NEGATIVE
Ketones, ur: NEGATIVE mg/dL
Nitrite: POSITIVE — AB
Protein, ur: NEGATIVE mg/dL
Specific Gravity, Urine: 1.011 (ref 1.005–1.030)
pH: 6 (ref 5.0–8.0)

## 2020-11-20 LAB — CBC
HCT: 38.8 % (ref 36.0–46.0)
Hemoglobin: 12.6 g/dL (ref 12.0–15.0)
MCH: 27.4 pg (ref 26.0–34.0)
MCHC: 32.5 g/dL (ref 30.0–36.0)
MCV: 84.3 fL (ref 80.0–100.0)
Platelets: 263 10*3/uL (ref 150–400)
RBC: 4.6 MIL/uL (ref 3.87–5.11)
RDW: 15.7 % — ABNORMAL HIGH (ref 11.5–15.5)
WBC: 6.1 10*3/uL (ref 4.0–10.5)
nRBC: 0 % (ref 0.0–0.2)

## 2020-11-20 LAB — PROTIME-INR
INR: 1.4 — ABNORMAL HIGH (ref 0.8–1.2)
Prothrombin Time: 16.7 s — ABNORMAL HIGH (ref 11.4–15.2)

## 2020-11-20 LAB — CBG MONITORING, ED: Glucose-Capillary: 108 mg/dL — ABNORMAL HIGH (ref 70–99)

## 2020-11-20 LAB — TROPONIN I (HIGH SENSITIVITY): Troponin I (High Sensitivity): 15 ng/L (ref <18)

## 2020-11-20 MED ORDER — SODIUM CHLORIDE 0.9% FLUSH
3.0000 mL | Freq: Once | INTRAVENOUS | Status: AC
  Administered 2020-11-20: 3 mL via INTRAVENOUS

## 2020-11-20 MED ORDER — CEPHALEXIN 500 MG PO CAPS: 500.0000 mg | ORAL_CAPSULE | Freq: Three times a day (TID) | ORAL | 0 refills | Status: DC

## 2020-11-20 MED ORDER — IOHEXOL 350 MG/ML SOLN
75.0000 mL | Freq: Once | INTRAVENOUS | Status: AC | PRN
  Administered 2020-11-20: 60 mL via INTRAVENOUS

## 2020-11-20 NOTE — ED Provider Notes (Signed)
[facility] Emergency Department Provider Note  ____________________________________________   Event Date/Time   First MD Initiated Contact with Patient 11/20/20 1128     (approximate)  I have reviewed the triage vital signs and the nursing notes.   HISTORY  Chief Complaint Weakness, Altered Mental Status, and Loss of Consciousness    HPI Bianca [name] is a 80 y.o. female presents emergency department via [facility] for code stroke.  Neurologist is at patient's bedside.  EMS reports the patient had a bowel movement and vagal down and became unresponsive.  When she returned to consciousness her blood pressure was extremely high.  She has weakness along the right arm and right leg.  Patient is COVID-positive this time.  Unable to perform remainder of HPI due to patient's mental status at this time.    History reviewed. No pertinent past medical history.  There are no problems to display for this patient.     Prior to Admission medications   Medication Sig Start Date End Date Taking? Authorizing Provider  ADVAIR DISKUS 100-50 MCG/ACT AEPB 1 puff 2 (two) times daily. 11/01/20  Yes [provider]  atorvastatin (LIPITOR) 20 MG tablet Take 1 tablet by mouth daily. 10/19/20  Yes [provider]  busPIRone (BUSPAR) 7.5 MG tablet Take 7.5 mg by mouth 2 (two) times daily. 08/27/20  Yes [provider]  cephALEXin (KEFLEX) 500 MG capsule Take 1 capsule (500 mg total) by mouth 3 (three) times daily. 11/20/20  Yes Finneus Kaneshiro, Linden Dolin, PA-C  ELIQUIS 2.5 MG TABS tablet Take 2.5 mg by mouth 2 (two) times daily. 11/14/20  Yes [provider]  ivermectin (STROMECTOL) 3 MG TABS tablet Take 150 mcg/kg by mouth in the morning and at bedtime. For 7 days start 11/18/20   Yes [provider]  metoprolol succinate (TOPROL-XL) 25 MG 24 hr tablet Take 1 tablet by mouth daily. 10/21/20  Yes [provider]  midodrine (PROAMATINE) 5  MG tablet Take 5 mg by mouth 3 (three) times daily with meals. 10/31/20  Yes [provider]  pyridostigmine (MESTINON) 60 MG tablet Take 60 mg by mouth in the morning and at bedtime. 08/14/20  Yes [provider]  tamsulosin (FLOMAX) 0.4 MG CAPS capsule Take 0.4 mg by mouth daily. 11/08/20  Yes [provider]  estradiol (ESTRACE) 0.1 MG/GM vaginal cream SMARTSIG:Topical Patient not taking: No sig reported 09/06/20   [provider]  fludrocortisone (FLORINEF) 0.1 MG tablet Take 100 mcg by mouth 2 (two) times daily. Patient not taking: No sig reported 07/11/20   [provider]    Allergies Patient has no known allergies.  History reviewed. No pertinent family history.  Social History    Review of Systems Unable to perform due to patient's mental status during code stroke  Constitutional: No fever/chills Eyes: No visual changes. ENT: No sore throat. Respiratory: Denies cough Cardiovascular: Denies chest pain Gastrointestinal: Denies abdominal pain Genitourinary: Negative for dysuria. Musculoskeletal: Negative for back pain. Skin: Negative for rash. Psychiatric: no mood changes,     ____________________________________________   PHYSICAL EXAM:  VITAL SIGNS: ED Triage Vitals [11/20/20 1124]  Enc Vitals Group     BP      Pulse Rate 95     Resp 19     Temp 97.7 F (36.5 C)     Temp Source Oral     SpO2      Weight      Height  Head Circumference      Peak Flow      Pain Score      Pain Loc      Pain Edu?      Excl. in [place]?     Constitutional: Alert, is oriented to time place.  Nontoxic, able answer questions appropriately although slightly  eyes: Conjunctivae are normal. perrl Head: Atraumatic. Nose: No congestion/rhinnorhea. Mouth/Throat: Mucous membranes are moist.   Neck:  supple no lymphadenopathy noted Cardiovascular: Normal rate, regular rhythm. Heart sounds are normal Respiratory: Normal respiratory  effort.  No retractions, lungs c t a  Abd: soft nontender bs normal all 4 quad GU: deferred Musculoskeletal: FROM all extremities, warm and well perfused Neurologic:  Normal speech and language.  Cranial nerves II through XII are grossly intact, negative pronator, negative finger-to-nose, same sensation bilaterally Skin:  Skin is warm, dry and intact. No rash noted. Psychiatric: Mood and affect are normal. Speech and behavior are normal.  ____________________________________________   LABS (all labs ordered are listed, but only abnormal results are displayed)  Labs Reviewed  PROTIME-INR - Abnormal; Notable for the following components:      Result Value   Prothrombin Time 16.7 (*)    INR 1.4 (*)    All other components within normal limits  CBC - Abnormal; Notable for the following components:   RDW 15.7 (*)    All other components within normal limits  COMPREHENSIVE METABOLIC PANEL - Abnormal; Notable for the following components:   Glucose, Bld 115 (*)    Creatinine, Ser 1.31 (*)    Calcium 8.7 (*)    Albumin 3.4 (*)    GFR, Estimated 41 (*)    All other components within normal limits  URINALYSIS, COMPLETE (UACMP) WITH MICROSCOPIC - Abnormal; Notable for the following components:   Color, Urine YELLOW (*)    APPearance HAZY (*)    Nitrite POSITIVE (*)    Leukocytes,Ua MODERATE (*)    All other components within normal limits  CBG MONITORING, ED - Abnormal; Notable for the following components:   Glucose-Capillary 108 (*)    All other components within normal limits  URINE CULTURE  APTT  DIFFERENTIAL  I-STAT CREATININE, ED  TROPONIN I (HIGH SENSITIVITY)   ____________________________________________   ____________________________________________  RADIOLOGY  CT of the head cxr Ct pe  ____________________________________________   PROCEDURES  Procedure(s) performed: No  Procedures    ____________________________________________   INITIAL IMPRESSION  / ASSESSMENT AND PLAN / ED COURSE  Pertinent labs & imaging results that were available during my care of the patient were reviewed by me and considered in my medical decision making (see chart for details).   Patient is a 80 year-old female presents today ER via EMS for code stroke.  See HPI physical exam shows her to be stable at this time  DDx: CVA, subarachnoid, covid encephalopathy, reoccurrence of symptoms due to old strokes by stressors  Labs and imaging ordered Consult ongoing with neurologist   CT of the head is negative.  EMS interventions been called for chest pain.  Patient was recently diagnosed with COVID.  Review of chart that needs to be merged shows patient has a history of delirium, hypertension, small cell lung cancer.  Chest x-ray, CT for PE  Patient's labs are reassuring, CBC, comprehensive metabolic panel, PT/PTT are all normal, troponins normal  Chest x-ray shows increasing pleural effusion CT for PE does not show an embolism but does show a new pulmonary nodule  Patient is able  to ambulate to the bathroom without difficulty.  O2 saturations have remained normal without O2 therapy  I did discuss the use of ivermectin with the daughters.  Explained this may be making her more susceptible to syncope.  Feel that they should stop this medications as it could cause torsades.  They are resistant to this idea, I explained to them that they should discuss this with Dr. Jimmye Norman as he wrote prescription.   She is to continue her Z-Pak that she was given.  Return emergency department worsening.  I will call them with the urinalysis result.  ----------------------------------------- 5:05 PM on 11/20/2020 -----------------------------------------  , The patient's daughter to notify her of the urinalysis results.  I did send a prescription for Keflex 3 times daily.  Urine culture was added.  Follow-up with your regular doctor.  We did discuss pulmonary nodules and follow-up.   Strict instructions to return if worsening  Bianca [name] was evaluated in Emergency Department on 11/20/2020 for the symptoms described in the history of present illness. She was evaluated in the context of the global COVID-19 pandemic, which necessitated consideration that the patient might be at risk for infection with the SARS-CoV-2 virus that causes COVID-19. Institutional protocols and algorithms that pertain to the evaluation of patients at risk for COVID-19 are in a state of rapid change based on information released by regulatory bodies including the CDC and federal and state organizations. These policies and algorithms were followed during the patient's care in the ED.    As part of my medical decision making, I reviewed the following data within the [place] History obtained from family, Nursing notes reviewed and incorporated, Labs reviewed , EKG interpreted NSR, Old chart reviewed, Radiograph reviewed , A consult was requested and obtained from this/these consultant(s) Neurology, Evaluated by EM attending , Notes from prior ED visits and [place] Controlled Substance Database  ____________________________________________   FINAL CLINICAL IMPRESSION(S) / ED DIAGNOSES  Final diagnoses:  Near syncope  Weakness      NEW MEDICATIONS STARTED DURING THIS VISIT:  Discharge Medication List as of 11/20/2020  3:42 PM       Note:  This document was prepared using Dragon voice recognition software and may include unintentional dictation errors.    Versie Starks, PA-C 11/20/20 1707    Vanessa [place], MD 11/21/20 1002

## 2020-11-20 NOTE — Discharge Instructions (Signed)
Your mother has a new nodule noted in her lungs.  I would have her follow-up with her lung cancer physician.  For the ambulatory pulmonary nodule clinic here at [facility].  We recommend he stop the ivermectin as the side effects on this medication can cause more frequent syncope episodes in which she is passing out.  I would advise you discuss this with Dr. Jimmye Norman prior to restarting her medication.

## 2020-11-20 NOTE — Consult Note (Signed)
Neurology Consultation Reason for Consult: Code stroke for right-sided weakness Requesting Physician: Marjean Donna  CC: Syncopal event  History is obtained from: EMS, chart review and daughter via telephone  HPI: Bianca [name] is a 80 y.o. 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  Her daughter and primary caregiver was diagnosed with COVID-19 on Saturday.  The patient subsequently became symptomatic with some generalized weakness and mild shortness of breath and tested positive on Monday.  She was started on ivermectin by Dr. Juanda Chance and has otherwise been doing well.  However given her primary caregiver has been ill, outside help was hired to help care for the patient.  She was last well at 9 AM but when she walked over to the bathroom with her normal shuffling gait.  She had a bowel movement and syncopized which is not atypical for her.  However she was taking some time to come around and the caregiver was relatively new to her, EMS was activated.  On EMS arrival she was noted to have some right-sided weakness with mild drift of the right upper extremity as well as some mild weakness of the right lower extremity, in addition to being somewhat confused, for which code stroke was activated.  Additionally EMS noted that her systolic blood pressures were in the 200s.  Daughter notes that she has been on as needed losartan which she takes only if her systolic blood pressures are sustained above 180.  On review of the chart she had a similar presentation in 07/15/2020, with 5 to 10 minutes of right-sided weakness after a syncopal event at which  time MRI brain was negative for acute intracranial process and a full stroke work-up was completed.  EMS noted that her symptoms were rapidly resolving in route.  By the time of my evaluation the patient was normal though she initially reported some left-sided sensory loss in the left upper extremity which was normal on repeat testing.  Daughter reports that in addition to her event and late December with transient right-sided weakness, she also had similar weakness 2 to 3 months ago after another prolonged syncopal event.  "L brainTIAlikely in the setting of pre-syncope secondary to orthostatic hypotension  Code Stroke CT head No acute abnormality. Small vessel disease. Atrophy.   MRINo acute infarct.   MRA head & neckNo LVO or significant stenosis  2D EchoEF60-65%. No source of embolus  LDL99, for which statin was started  HgbA1c5.2  Eliquis (apixaban) daily2.5 bidprior to admission, continued on discharge."  LKW: 9 AM tPA given?: No, on Eliquis last dose this morning  Premorbid modified rankin scale:      2 - Slight disability. Able to look after own affairs without assistance, but unable to carry out all previous activities.  ROS: All other systems are reviewed and negative except as noted above in HPI  History reviewed. No pertinent past medical history. Please see above and linked chart and key details above--  Bianca [name] Female, 80 y.o., Apr 23, 1941 MRN: [id number]    Family History:       Family History  Problem Relation Age of Onset  . CAD Mother        possible small heart attack  .  Thyroid disease Sister        she thinks hyperthyroidism     Social History:  has no history on file for tobacco use, alcohol use, and drug use. Prior smoking history quit in 03/2020, please see linked chart for full details  Exam: Current vital signs: BP (!) 175/78 (BP Location: Right Arm)   Pulse 95   Temp 97.7 F (36.5 C) (Oral)   Resp 19   Ht 5\' 9"   (1.753 m)   Wt 48.5 kg   SpO2 95%   BMI 15.80 kg/m  Vital signs in last 24 hours: Temp:  [97.7 F (36.5 C)] 97.7 F (36.5 C) (05/04 1124) Pulse Rate:  [95] 95 (05/04 1124) Resp:  [19] 19 (05/04 1124) BP: (175)/(78) 175/78 (05/04 1124) SpO2:  [95 %] 95 % (05/04 1124) Weight:  [48.5 kg] 48.5 kg (05/04 1158)   Physical Exam  Constitutional: Appears chronically ill  Psych: Affect appropriate to situation, calm and cooperative Eyes: No scleral injection HENT: No oropharyngeal obstruction.  MSK: no joint deformities, diffuse muscle atrophy Cardiovascular: Normal rate and regular rhythm, systolic blood pressures in the 170s.  Respiratory: No grossly audible wheezing, fairly comfortable on room air GI: Soft.  No distension. There is no tenderness.  Skin: Warm dry and intact visible skin  Neuro: Mental Status: Patient is awake, alert, oriented to person, place, month, year, and situation. Patient is able to give limited history, she is a bit confused on the details of what happened this morning No signs of aphasia or neglect within limits of being slightly hard of hearing/vision issues Cranial Nerves: II: Visual Fields are full. Pupils are equal, round, and reactive to light. III,IV, VI: EOMI without ptosis or diploplia.  Mildly saccadic pursuits V: Facial sensation is symmetric to light touch VII: Facial movement is symmetric.  VIII: hearing is baseline hard of hearing X: Uvula elevates symmetrically XI: Shoulder shrug is symmetric. XII: tongue is midline without atrophy or fasciculations.  Motor: Bulk is notable for diffuse atrophy.  Tone is normal.  No drift of the bilateral upper extremities.  Able to maintain bilateral lower extremities antigravity for at least 5 seconds.  Using all extremities equally in casual movements and moving all of them spontaneously and freely Sensory: Initially reported some reduced sensation in the left upper extremity but this was resolved on  repeat testing Deep Tendon Reflexes: 2+ and symmetric in the biceps and patellae.  Cerebellar: FNF and HKS are intact bilaterally  NIHSS total 1 Score breakdown: Reduced sensation on the left arm -- 1 point     I have reviewed labs in epic and the results pertinent to this consultation are: Creatinine 1.3 (which is her baseline), CBC within normal limits other than mildly elevated RDW at 15.7, INR 1.4 consistent with Eliquis taken this morning with a mildly elevated PTT at 16.7   I have reviewed the images obtained: Personally reviewed head CT.  Stable from prior (also personally reviewed), with significant atrophy and moderate chronic microvascular disease, slightly worse in the left cerebral hemisphere compared to the right   Impression: Given recurrent stereotyped symptoms in the setting of syncope without large vessel stenosis and some mild asymmetry in the chronic microvascular disease on her brain imaging, I suspect that this patient's presentation is secondary to cerebral hypoperfusion.  Additionally she is already medically maximized in terms of secondary stroke prevention on Eliquis and statin.    Recommendations: -No further inpatient neurological work-up -Medical clearance given COVID-19 infection per  ED team  Lesleigh Noe MD-PhD [facility] [phone]

## 2020-11-20 NOTE — ED Notes (Signed)
Pt at CT

## 2020-11-20 NOTE — ED Notes (Signed)
Pt presents to ED via POV with concerns for code stroke. Per EMS pt was at home with her caregiver when she shuffled (normal for her) to the restroom to have a BM where EMS states she then had a syncopal episode that lasted for about 10-15 minutes. Pt then stated she had some right sided weakness that started after this syncopal episode. Per EMS and caregiver is not unusual for pt to have a syncopal episode after having a BM. Pt presents A&Ox4. Pt has no pronator drift and has equal hand grips at this time.   Per EMS and family last known well was 0900 before the syncopal episode happened. Pt was not hypotensive when EMS arrived, pt has a significant HX of orthostatic hypotension.    Pt recently DX'ed with COVID on this past Monday

## 2020-11-20 NOTE — ED Notes (Signed)
Pt was able to stand and ambulate. Plan is to discharge pt.

## 2020-11-20 NOTE — Progress Notes (Signed)
CODE STROKE- PHARMACY COMMUNICATION  Time CODE STROKE called/page received: 1110 (to ED via EMS; ETA 94mn)  Time response to CODE STROKE was made (in person): 1115  Time Stroke Kit retrieved from [facility](per protocol):1120  Pt was ineligible for tPA; LKW ~0900, pt took eliquis (afib) @0900  AM of arrival per EMS/facility report. Code stroke called off ~1145.   Name of Provider/Nurse contacted:MD SRISHTI BHAGAT  No past medical history on file. Prior to Admission medications   Not on File    BLorna Dibble,PharmD Clinical Pharmacist  11/20/2020  11:57 AM

## 2020-11-21 ENCOUNTER — Encounter: Payer: Self-pay | Admitting: Physician Assistant

## 2020-11-22 ENCOUNTER — Telehealth: Payer: Self-pay

## 2020-11-22 MED ORDER — ATORVASTATIN CALCIUM 20 MG PO TABS: 1.0000 | ORAL_TABLET | Freq: Every day | ORAL | 1 refills | Status: DC

## 2020-11-22 NOTE — Telephone Encounter (Signed)
[facility] faxed refill request for the following medications:  atorvastatin (LIPITOR) 20 MG tablet   Please advise.

## 2020-11-23 LAB — URINE CULTURE: Culture: >=100000 — AB

## 2020-11-24 NOTE — Progress Notes (Signed)
ED Antimicrobial Stewardship Positive Culture Follow Up   Bianca [name] is an 80 y.o. female who presented to [facility] on 11/20/2020 with a chief complaint of  Chief Complaint  Patient presents with  . Weakness  . Altered Mental Status  . Loss of Consciousness    Recent Results (from the past 720 hour(s))  Urine Culture     Status: Abnormal   Collection Time: 11/20/20  4:00 PM   Specimen: Urine, Random  Result Value Ref Range Status   Specimen Description   Final    URINE, RANDOM Performed at [facility], [address], [place], [place] [address]    Special Requests   Final    NONE Performed at [facility], [address] [place], [place] [address]    Culture (A)  Final    >=100,000 COLONIES/mL KLEBSIELLA PNEUMONIAE Confirmed Extended Spectrum Beta-Lactamase Producer (ESBL).  In bloodstream infections from ESBL organisms, carbapenems are preferred over piperacillin/tazobactam. They are shown to have a lower risk of mortality.    Report Status 11/23/2020 FINAL  Final   Organism ID, Bacteria KLEBSIELLA PNEUMONIAE (A)  Final      Susceptibility   Klebsiella pneumoniae - MIC*    AMPICILLIN >=32 RESISTANT Resistant     CEFAZOLIN >=64 RESISTANT Resistant     CEFEPIME >=32 RESISTANT Resistant     CEFTRIAXONE >=64 RESISTANT Resistant     CIPROFLOXACIN 1 SENSITIVE Sensitive     GENTAMICIN <=1 SENSITIVE Sensitive     IMIPENEM <=0.25 SENSITIVE Sensitive     NITROFURANTOIN 64 INTERMEDIATE Intermediate     TRIMETH/SULFA >=320 RESISTANT Resistant     AMPICILLIN/SULBACTAM 16 INTERMEDIATE Intermediate     PIP/TAZO <=4 SENSITIVE Sensitive     * >=100,000 COLONIES/mL KLEBSIELLA PNEUMONIAE    [x]  Treated with Cephalexin, organism resistant to prescribed antimicrobial.   Klebsiella Pneumoniae ESBL.  New antibiotic prescription: Fosfomycin 3 gm packet every other day x 3 doses total.   ED Provider: Valora Piccolo  11/24/20 Called patient [phone]) to get  preferred pharmacy: [facility] [phone]. This pharmacy is closed today. Will call in tomorrow am 11/25/20. Discussed with patient that new antibiotic will be ordered and to discontinue current antibiotic. Explained if symptoms do not improve in 48 hours or so then contact current provider for reassessment.    Francessca Friis A 11/24/2020, 3:45 PM Clinical Pharmacist

## 2020-11-25 ENCOUNTER — Encounter: Payer: Self-pay | Admitting: *Deleted

## 2020-11-25 NOTE — Progress Notes (Signed)
ED Antimicrobial Stewardship Positive Culture Follow Up   Bianca [name] is an 80 y.o. female who presented to [facility] on 11/20/2020 with a chief complaint of  Chief Complaint  Patient presents with  . Weakness  . Altered Mental Status  . Loss of Consciousness    Recent Results (from the past 720 hour(s))  Urine Culture     Status: Abnormal   Collection Time: 11/20/20  4:00 PM   Specimen: Urine, Random  Result Value Ref Range Status   Specimen Description   Final    URINE, RANDOM Performed at [facility], [address] [place], [place] [address]    Special Requests   Final    NONE Performed at [facility], [address] [place],  [address]    Culture (A)  Final    >=100,000 COLONIES/mL KLEBSIELLA PNEUMONIAE Confirmed Extended Spectrum Beta-Lactamase Producer (ESBL).  In bloodstream infections from ESBL organisms, carbapenems are preferred over piperacillin/tazobactam. They are shown to have a lower risk of mortality.    Report Status 11/23/2020 FINAL  Final   Organism ID, Bacteria KLEBSIELLA PNEUMONIAE (A)  Final      Susceptibility   Klebsiella pneumoniae - MIC*    AMPICILLIN >=32 RESISTANT Resistant     CEFAZOLIN >=64 RESISTANT Resistant     CEFEPIME >=32 RESISTANT Resistant     CEFTRIAXONE >=64 RESISTANT Resistant     CIPROFLOXACIN 1 SENSITIVE Sensitive     GENTAMICIN <=1 SENSITIVE Sensitive     IMIPENEM <=0.25 SENSITIVE Sensitive     NITROFURANTOIN 64 INTERMEDIATE Intermediate     TRIMETH/SULFA >=320 RESISTANT Resistant     AMPICILLIN/SULBACTAM 16 INTERMEDIATE Intermediate     PIP/TAZO <=4 SENSITIVE Sensitive     * >=100,000 COLONIES/mL KLEBSIELLA PNEUMONIAE    [x]  Treated with Cephalexin, organism resistant to prescribed antimicrobial.   Klebsiella Pneumoniae ESBL.  New antibiotic prescription: Fosfomycin 3 gm packet every other day x 3 doses total.   ED Provider: Valora Piccolo  11/24/20 Called patient [phone]) to get  preferred pharmacy: [facility] [phone]. This pharmacy is closed today. Will call in tomorrow am 11/25/20. Discussed with patient that new antibiotic will be ordered and to discontinue current antibiotic. Explained if symptoms do not improve in 48 hours or so then contact current provider for reassessment.   11/25/20 10:50am Called in Prescription for above antibiotic to [facility] [phone].   [name] A 11/25/2020, 12:07 PM Clinical Pharmacist

## 2020-12-05 ENCOUNTER — Ambulatory Visit (INDEPENDENT_AMBULATORY_CARE_PROVIDER_SITE_OTHER): Payer: PPO | Admitting: Family Medicine

## 2020-12-05 ENCOUNTER — Encounter: Payer: Self-pay | Admitting: Family Medicine

## 2020-12-05 ENCOUNTER — Other Ambulatory Visit: Payer: Self-pay

## 2020-12-05 VITALS — BP 100/56 | HR 62 | Temp 97.9°F | Resp 16 | Wt 107.0 lb

## 2020-12-05 DIAGNOSIS — N390 Urinary tract infection, site not specified: Secondary | ICD-10-CM

## 2020-12-05 DIAGNOSIS — Z720 Tobacco use: Secondary | ICD-10-CM

## 2020-12-05 DIAGNOSIS — I951 Orthostatic hypotension: Secondary | ICD-10-CM

## 2020-12-05 DIAGNOSIS — M810 Age-related osteoporosis without current pathological fracture: Secondary | ICD-10-CM | POA: Diagnosis not present

## 2020-12-05 DIAGNOSIS — N1831 Chronic kidney disease, stage 3a: Secondary | ICD-10-CM

## 2020-12-05 DIAGNOSIS — E441 Mild protein-calorie malnutrition: Secondary | ICD-10-CM

## 2020-12-05 DIAGNOSIS — N952 Postmenopausal atrophic vaginitis: Secondary | ICD-10-CM | POA: Diagnosis not present

## 2020-12-05 LAB — POCT URINALYSIS DIPSTICK
Bilirubin, UA: NEGATIVE
Blood, UA: NEGATIVE
Glucose, UA: NEGATIVE
Ketones, UA: NEGATIVE
Nitrite, UA: POSITIVE
Protein, UA: NEGATIVE
Spec Grav, UA: 1.01 (ref 1.010–1.025)
Urobilinogen, UA: 0.2 E.U./dL
pH, UA: 6 (ref 5.0–8.0)

## 2020-12-05 MED ORDER — NITROFURANTOIN MACROCRYSTAL 50 MG PO CAPS: 50.0000 mg | ORAL_CAPSULE | Freq: Two times a day (BID) | ORAL | 0 refills | Status: AC

## 2020-12-05 MED ORDER — PREMARIN 0.625 MG/GM VA CREA: 1.0000 | TOPICAL_CREAM | Freq: Every day | VAGINAL | 12 refills | Status: DC

## 2020-12-05 NOTE — Patient Instructions (Signed)
Push fluids. Try Cranberry Juice daily. Try Probiotic daily.

## 2020-12-05 NOTE — Progress Notes (Signed)
Established patient visit   Patient: Bianca [name]   DOB: July 18, 1941   80 y.o. Female  MRN: [id number] Visit Date: 12/05/2020  Today's healthcare provider: Wilhemena Durie, MD   Chief Complaint  Patient presents with  . Urinary Tract Infection   Subjective    Urinary Tract Infection  This is a recurrent problem. The current episode started in the past 7 days. The problem has been gradually worsening. The patient is experiencing no pain. There has been no fever. Associated symptoms include frequency and urgency. She has tried nothing for the symptoms. Her past medical history is significant for recurrent UTIs.    Patient is a pleasant and malnourished female in no acute distress.  She has chronic recurrent urinary symptoms.  She has had no recent fall or recent injuries due to her syncopal episodes      Medications: Outpatient Medications Prior to Visit  Medication Sig  . acetaminophen (TYLENOL) 500 MG tablet Take 1,000 mg by mouth every 6 (six) hours as needed (for headaches).  . ADVAIR DISKUS 100-50 MCG/ACT AEPB 1 puff 2 (two) times daily.  Marland Kitchen ADVAIR DISKUS 100-50 MCG/DOSE AEPB Inhale 1 puff into the lungs in the morning and at bedtime. RINSE MOUTH AFTER USE  . atorvastatin (LIPITOR) 20 MG tablet Take 1 tablet (20 mg total) by mouth daily.  . busPIRone (BUSPAR) 7.5 MG tablet Take 7.5 mg by mouth 2 (two) times daily.  . Calcium Carb-Cholecalciferol (CALCIUM+D3 PO) Take 1 tablet by mouth daily.  . cephALEXin (KEFLEX) 500 MG capsule Take 1 capsule (500 mg total) by mouth 3 (three) times daily.  . Cholecalciferol (VITAMIN D3) 50 MCG (2000 UT) TABS Take 2,000 Units by mouth daily.   . ciprofloxacin (CIPRO) 250 MG tablet Take 1 tablet (250 mg total) by mouth 2 (two) times daily.  Marland Kitchen ELIQUIS 2.5 MG TABS tablet TAKE 1 TABLET BY MOUTH TWICE DAILY (Patient taking differently: Take 2.5 mg by mouth with breakfast, with lunch, and with evening meal.)  . ELIQUIS 2.5 MG TABS tablet Take  2.5 mg by mouth 2 (two) times daily.  Marland Kitchen estradiol (ESTRACE) 0.1 MG/GM vaginal cream SMARTSIG:Topical (Patient not taking: No sig reported)  . ferrous gluconate (FERGON) 324 MG tablet Take 1 tablet (324 mg total) by mouth 2 (two) times daily with a meal.  . fludrocortisone (FLORINEF) 0.1 MG tablet Take 100 mcg by mouth 2 (two) times daily. (Patient not taking: No sig reported)  . ivermectin (STROMECTOL) 3 MG TABS tablet Take 150 mcg/kg by mouth in the morning and at bedtime. For 7 days start 11/18/20  . lactose free nutrition (BOOST) LIQD Take 237 mLs by mouth 2 (two) times daily between meals.  . metoprolol succinate (TOPROL-XL) 25 MG 24 hr tablet Take 1 tablet by mouth daily.  . midodrine (PROAMATINE) 5 MG tablet Take 1/2 tablet for systolic blood pressure between 120 - 160.  Take 1 tablet for systolic blood pressure less than 120. (Patient taking differently: Take 2.5-5 mg by mouth See admin instructions. Take 2.5 mg by mouth for systolic B/P between 606-301 and 5 mg for a systolic B/P less than 601)  . midodrine (PROAMATINE) 5 MG tablet Take 5 mg by mouth 3 (three) times daily with meals.  Marland Kitchen PROAIR HFA 108 (90 Base) MCG/ACT inhaler INHALE 1-2 PUFFS INTO THE LUNGS EVERY 4 HOURS AS NEEDED (Patient taking differently: Inhale 1-2 puffs into the lungs every 4 (four) hours as needed for wheezing or shortness of  breath.)  . pyridostigmine (MESTINON) 60 MG tablet Take 0.5 tablets (30 mg total) by mouth in the morning and at bedtime.  . pyridostigmine (MESTINON) 60 MG tablet Take 60 mg by mouth in the morning and at bedtime.  . tamsulosin (FLOMAX) 0.4 MG CAPS capsule Take 0.4 mg by mouth daily.  . vitamin C (ASCORBIC ACID) 500 MG tablet Take 500 mg by mouth daily.    No facility-administered medications prior to visit.    Review of Systems  Genitourinary: Positive for frequency and urgency.        Objective    BP (!) 100/56   Pulse 62   Temp 97.9 F (36.6 C)   Resp 16   Wt 107 lb (48.5 kg)    LMP  (LMP Unknown)   BMI 15.80 kg/m  BP Readings from Last 3 Encounters:  12/05/20 (!) 100/56  11/20/20 (!) 154/94  09/10/20 112/70   Wt Readings from Last 3 Encounters:  12/05/20 107 lb (48.5 kg)  11/20/20 107 lb (48.5 kg)  09/10/20 105 lb 1.6 oz (47.7 kg)       Physical Exam Vitals reviewed.  Constitutional:      General: She is not in acute distress.    Appearance: Normal appearance. She is well-developed. She is not diaphoretic.  Cardiovascular:     Rate and Rhythm: Normal rate and regular rhythm.  Pulmonary:     Effort: Pulmonary effort is normal. No respiratory distress.     Breath sounds: Normal breath sounds.  Abdominal:     General: Bowel sounds are normal. There is no distension.     Palpations: Abdomen is soft.     Tenderness: There is no abdominal tenderness. There is no guarding or rebound.  Skin:    General: Skin is warm and dry.  Neurological:     Mental Status: She is alert and oriented to person, place, and time.  Psychiatric:        Mood and Affect: Mood normal.        Behavior: Behavior normal.       No results found for any visits on 12/05/20.  Assessment & Plan     1. Urinary tract infection without hematuria, site unspecified Treat with Macrodantin low-dose.More than 50% 25 minute visit spent in counseling or coordination of care  - Urine Culture - POCT urinalysis dipstick - nitrofurantoin (MACRODANTIN) 50 MG capsule; Take 1 capsule (50 mg total) by mouth 2 (two) times daily for 5 days.  Dispense: 10 capsule; Refill: 0  2. Atrophic vaginitis Treat for potential/likely atrophic urethritis with Premarin vaginal cream a dab around the urethra bedtime. More than 50% 25 minute visit spent in counseling or coordination of care  - conjugated estrogens (PREMARIN) vaginal cream; Place 1 Applicatorful vaginally daily.  Dispense: 42.5 g; Refill: 12  3. Stage 3a chronic kidney disease ([place])   4. Tobacco abuse   5. Orthostatic  hypotension Patient on Florinef and midodrine  6. Malnutrition of mild degree ([place]) Patient encouraged to eat.  7. Osteoporosis, unspecified osteoporosis type, unspecified pathological fracture presence Fracture risk due to the orthostasis and possible fall   No follow-ups on file.      I, Wilhemena Durie, MD, have reviewed all documentation for this visit. The documentation on 12/10/20 for the exam, diagnosis, procedures, and orders are all accurate and complete.    Nyle Limb Cranford Mon, MD  [facility] [phone] (phone) [phone] (fax)  [place]

## 2020-12-06 DIAGNOSIS — R339 Retention of urine, unspecified: Secondary | ICD-10-CM | POA: Diagnosis not present

## 2020-12-13 ENCOUNTER — Other Ambulatory Visit: Payer: Self-pay | Admitting: Internal Medicine

## 2020-12-13 LAB — URINE CULTURE

## 2020-12-17 NOTE — Telephone Encounter (Signed)
Pt now being followed by [facility].

## 2020-12-20 ENCOUNTER — Telehealth: Payer: Self-pay | Admitting: Family Medicine

## 2020-12-20 NOTE — Telephone Encounter (Signed)
Pts daughter Bianca [name] [facility]) looked at the lab results wanting to know if this is a reoccurring UTI? Please advise CB- [phone]

## 2020-12-21 DIAGNOSIS — N3001 Acute cystitis with hematuria: Secondary | ICD-10-CM | POA: Diagnosis not present

## 2021-01-03 DIAGNOSIS — F0390 Unspecified dementia without behavioral disturbance: Secondary | ICD-10-CM | POA: Diagnosis not present

## 2021-01-03 DIAGNOSIS — J90 Pleural effusion, not elsewhere classified: Secondary | ICD-10-CM | POA: Diagnosis not present

## 2021-01-03 DIAGNOSIS — R4182 Altered mental status, unspecified: Secondary | ICD-10-CM | POA: Diagnosis not present

## 2021-01-03 DIAGNOSIS — R7989 Other specified abnormal findings of blood chemistry: Secondary | ICD-10-CM | POA: Diagnosis not present

## 2021-01-03 DIAGNOSIS — K5730 Diverticulosis of large intestine without perforation or abscess without bleeding: Secondary | ICD-10-CM | POA: Diagnosis not present

## 2021-01-03 DIAGNOSIS — J449 Chronic obstructive pulmonary disease, unspecified: Secondary | ICD-10-CM | POA: Diagnosis not present

## 2021-01-03 DIAGNOSIS — N390 Urinary tract infection, site not specified: Secondary | ICD-10-CM | POA: Diagnosis not present

## 2021-01-03 DIAGNOSIS — N3289 Other specified disorders of bladder: Secondary | ICD-10-CM | POA: Diagnosis not present

## 2021-01-03 DIAGNOSIS — N261 Atrophy of kidney (terminal): Secondary | ICD-10-CM | POA: Diagnosis not present

## 2021-01-03 DIAGNOSIS — R9431 Abnormal electrocardiogram [ECG] [EKG]: Secondary | ICD-10-CM | POA: Diagnosis not present

## 2021-01-03 DIAGNOSIS — J441 Chronic obstructive pulmonary disease with (acute) exacerbation: Secondary | ICD-10-CM | POA: Diagnosis not present

## 2021-01-03 DIAGNOSIS — Z87440 Personal history of urinary (tract) infections: Secondary | ICD-10-CM | POA: Diagnosis not present

## 2021-01-04 DIAGNOSIS — F0390 Unspecified dementia without behavioral disturbance: Secondary | ICD-10-CM | POA: Diagnosis not present

## 2021-01-04 DIAGNOSIS — J90 Pleural effusion, not elsewhere classified: Secondary | ICD-10-CM | POA: Diagnosis not present

## 2021-01-04 DIAGNOSIS — N3289 Other specified disorders of bladder: Secondary | ICD-10-CM | POA: Diagnosis not present

## 2021-01-04 DIAGNOSIS — N261 Atrophy of kidney (terminal): Secondary | ICD-10-CM | POA: Diagnosis not present

## 2021-01-04 DIAGNOSIS — R9431 Abnormal electrocardiogram [ECG] [EKG]: Secondary | ICD-10-CM | POA: Diagnosis not present

## 2021-01-04 DIAGNOSIS — J441 Chronic obstructive pulmonary disease with (acute) exacerbation: Secondary | ICD-10-CM | POA: Diagnosis not present

## 2021-01-04 DIAGNOSIS — K5730 Diverticulosis of large intestine without perforation or abscess without bleeding: Secondary | ICD-10-CM | POA: Diagnosis not present

## 2021-01-04 DIAGNOSIS — R4182 Altered mental status, unspecified: Secondary | ICD-10-CM | POA: Diagnosis not present

## 2021-01-04 DIAGNOSIS — J449 Chronic obstructive pulmonary disease, unspecified: Secondary | ICD-10-CM | POA: Diagnosis not present

## 2021-01-04 DIAGNOSIS — N390 Urinary tract infection, site not specified: Secondary | ICD-10-CM | POA: Diagnosis not present

## 2021-01-06 DIAGNOSIS — I2510 Atherosclerotic heart disease of native coronary artery without angina pectoris: Secondary | ICD-10-CM | POA: Insufficient documentation

## 2021-01-06 DIAGNOSIS — I480 Paroxysmal atrial fibrillation: Secondary | ICD-10-CM | POA: Diagnosis not present

## 2021-01-06 DIAGNOSIS — I951 Orthostatic hypotension: Secondary | ICD-10-CM | POA: Diagnosis not present

## 2021-01-07 ENCOUNTER — Telehealth: Payer: Self-pay

## 2021-01-07 NOTE — Telephone Encounter (Signed)
Copied from [place] [id number]. Topic: Referral - Request for Referral >> Jan 06, 2021  3:51 PM Tessa Lerner A wrote: Has patient seen PCP for this complaint? Yes.    *If NO, is insurance requiring patient see PCP for this issue before PCP can refer them?  Referral for which specialty: Infectious Disease Specialist   Preferred provider/office: Patient has no preference  Reason for referral: Patient has frequent Urinary Tract Infections

## 2021-01-07 NOTE — Telephone Encounter (Signed)
Noted  

## 2021-01-07 NOTE — Telephone Encounter (Signed)
Pt's daughter, Bianca [name] returned the call.   I gave her the message from Dr. Brita Romp 01/07/2021 at 1:15 PM needing the urologist versus the ID. She has an appt tomorrow with the urologist so if Candice doesn't feel she is making head way with the urologist regarding the repeat UTIs she's going to call back.  I forwarded her message to Dr. Brita Romp at [facility].

## 2021-01-07 NOTE — Telephone Encounter (Signed)
Lmtcb okay for [facility] triage to advise. KW

## 2021-01-07 NOTE — Telephone Encounter (Signed)
Needs urology not ID for recurrent UTIs, but ok to place if referral to Urology if patient agrees.

## 2021-01-09 DIAGNOSIS — R3914 Feeling of incomplete bladder emptying: Secondary | ICD-10-CM | POA: Diagnosis not present

## 2021-01-09 DIAGNOSIS — N3020 Other chronic cystitis without hematuria: Secondary | ICD-10-CM | POA: Diagnosis not present

## 2021-01-10 ENCOUNTER — Encounter: Payer: Self-pay | Admitting: Family Medicine

## 2021-01-13 ENCOUNTER — Other Ambulatory Visit: Payer: Self-pay | Admitting: Internal Medicine

## 2021-01-13 NOTE — Telephone Encounter (Signed)
Age 80, weight 48.5kg, SCr 1.23 on 01/03/21 Last visit 07/23/20, afib

## 2021-01-13 NOTE — Telephone Encounter (Signed)
See previous phone note re: Urology not ID for recurrent UTIs

## 2021-01-16 ENCOUNTER — Other Ambulatory Visit: Payer: Self-pay | Admitting: Family Medicine

## 2021-01-16 DIAGNOSIS — N390 Urinary tract infection, site not specified: Secondary | ICD-10-CM

## 2021-01-27 ENCOUNTER — Telehealth: Payer: Self-pay

## 2021-01-27 NOTE — Chronic Care Management (AMB) (Signed)
    Chronic Care Management Pharmacy Assistant   Name: Bianca [name]  MRN: [id number] DOB: 04/17/1941  Reason for Encounter: Patient Assistance Coordination   01/27/2021- Patient assistance applications filled out for Eliquis with Bianca [name] Patient assistance program and for Advair with Bianca [name] patient assistance program. Applications placed in the mail to patient.    Medications: Outpatient Encounter Medications as of 01/27/2021  Medication Sig   acetaminophen (TYLENOL) 500 MG tablet Take 1,000 mg by mouth every 6 (six) hours as needed (for headaches).   ADVAIR DISKUS 100-50 MCG/ACT AEPB 1 puff 2 (two) times daily.   ADVAIR DISKUS 100-50 MCG/DOSE AEPB Inhale 1 puff into the lungs in the morning and at bedtime. RINSE MOUTH AFTER USE   apixaban (ELIQUIS) 2.5 MG TABS tablet TAKE 1 TABLET BY MOUTH TWICE DAILY   atorvastatin (LIPITOR) 20 MG tablet Take 1 tablet (20 mg total) by mouth daily.   busPIRone (BUSPAR) 7.5 MG tablet Take 7.5 mg by mouth 2 (two) times daily.   Calcium Carb-Cholecalciferol (CALCIUM+D3 PO) Take 1 tablet by mouth daily.   Cholecalciferol (VITAMIN D3) 50 MCG (2000 UT) TABS Take 2,000 Units by mouth daily.    conjugated estrogens (PREMARIN) vaginal cream Place 1 Applicatorful vaginally daily.   ferrous gluconate (FERGON) 324 MG tablet Take 1 tablet (324 mg total) by mouth 2 (two) times daily with a meal.   fludrocortisone (FLORINEF) 0.1 MG tablet Take 100 mcg by mouth 2 (two) times daily. (Patient not taking: No sig reported)   ivermectin (STROMECTOL) 3 MG TABS tablet Take 150 mcg/kg by mouth in the morning and at bedtime. For 7 days start 11/18/20   lactose free nutrition (BOOST) LIQD Take 237 mLs by mouth 2 (two) times daily between meals.   metoprolol succinate (TOPROL-XL) 25 MG 24 hr tablet Take 1 tablet by mouth daily.   midodrine (PROAMATINE) 5 MG tablet Take 1/2 tablet for systolic blood pressure between 120 - 160.  Take 1 tablet for systolic blood pressure less than  120. (Patient taking differently: Take 2.5-5 mg by mouth See admin instructions. Take 2.5 mg by mouth for systolic B/P between 672-094 and 5 mg for a systolic B/P less than 709)   midodrine (PROAMATINE) 5 MG tablet Take 5 mg by mouth 3 (three) times daily with meals.   PROAIR HFA 108 (90 Base) MCG/ACT inhaler INHALE 1-2 PUFFS INTO THE LUNGS EVERY 4 HOURS AS NEEDED (Patient taking differently: Inhale 1-2 puffs into the lungs every 4 (four) hours as needed for wheezing or shortness of breath.)   pyridostigmine (MESTINON) 60 MG tablet Take 0.5 tablets (30 mg total) by mouth in the morning and at bedtime.   pyridostigmine (MESTINON) 60 MG tablet Take 60 mg by mouth in the morning and at bedtime.   tamsulosin (FLOMAX) 0.4 MG CAPS capsule Take 0.4 mg by mouth daily.   vitamin C (ASCORBIC ACID) 500 MG tablet Take 500 mg by mouth daily.    No facility-administered encounter medications on file as of 01/27/2021.    Care Gaps: COVID-19 Vaccine Zoster Vaccines- Shingrix (1 of 2) TETANUS/TDAP (Every 10 Years)- Last completed: Dec 25, 2008 DEXA SCAN (Every 2 Years)-Last completed: Jun 20, 2018   Star Rating Drugs: Atorvastatin 20 mg- Last filled 11/22/2020 for 90 day supply with [facility].  SIG: Bianca [name], Bianca [name] Pharmacist Assistant [phone]

## 2021-01-31 DIAGNOSIS — R339 Retention of urine, unspecified: Secondary | ICD-10-CM | POA: Diagnosis not present

## 2021-02-17 ENCOUNTER — Other Ambulatory Visit: Payer: Self-pay | Admitting: *Deleted

## 2021-02-17 ENCOUNTER — Encounter: Payer: Self-pay | Admitting: *Deleted

## 2021-02-17 DIAGNOSIS — R911 Solitary pulmonary nodule: Secondary | ICD-10-CM

## 2021-02-17 NOTE — Progress Notes (Signed)
Orders placed for follow up CT scan for lung nodule follow up

## 2021-02-20 ENCOUNTER — Ambulatory Visit: Payer: Self-pay | Admitting: Physician Assistant

## 2021-02-28 DIAGNOSIS — R339 Retention of urine, unspecified: Secondary | ICD-10-CM | POA: Diagnosis not present

## 2021-03-03 ENCOUNTER — Ambulatory Visit: Payer: PPO

## 2021-03-03 ENCOUNTER — Telehealth: Payer: Self-pay

## 2021-03-03 NOTE — Telephone Encounter (Signed)
Scheduled virtual appt for patient to be evaluated by Daneil Dan this Friday. KW

## 2021-03-03 NOTE — Telephone Encounter (Signed)
Copied from [place] [id number]. Topic: General - Other >> Mar 03, 2021  1:32 PM Lennox Solders wrote: Reason for CRM: Pt daughter candice is calling and would like order sent to [facility] for medium transport chair. Pt has health team adv and medicare part A and B

## 2021-03-03 NOTE — Telephone Encounter (Signed)
Please review request below, I have written order on script and placed it in your box for you to sign if you agree to request, please include diagnosis. Thanks. KW

## 2021-03-03 NOTE — Telephone Encounter (Signed)
Noted  

## 2021-03-03 NOTE — Telephone Encounter (Signed)
For new DME, medicare requires a face-to-face visit with documentation of need.  This can be video or in person.

## 2021-03-05 ENCOUNTER — Ambulatory Visit: Payer: PPO | Admitting: Oncology

## 2021-03-07 ENCOUNTER — Telehealth (INDEPENDENT_AMBULATORY_CARE_PROVIDER_SITE_OTHER): Payer: PPO | Admitting: Family Medicine

## 2021-03-07 ENCOUNTER — Other Ambulatory Visit: Payer: Self-pay

## 2021-03-07 NOTE — Progress Notes (Signed)
Patient would like to be seen next Friday because she has not done the virtual before and daughter  nor caregiver is there to assist her. Patient was not assisted and has been rescheduled

## 2021-03-12 DIAGNOSIS — R3914 Feeling of incomplete bladder emptying: Secondary | ICD-10-CM | POA: Diagnosis not present

## 2021-03-12 DIAGNOSIS — R8271 Bacteriuria: Secondary | ICD-10-CM | POA: Diagnosis not present

## 2021-03-12 DIAGNOSIS — N3020 Other chronic cystitis without hematuria: Secondary | ICD-10-CM | POA: Diagnosis not present

## 2021-03-14 ENCOUNTER — Telehealth (INDEPENDENT_AMBULATORY_CARE_PROVIDER_SITE_OTHER): Payer: Self-pay | Admitting: Family Medicine

## 2021-03-14 ENCOUNTER — Other Ambulatory Visit: Payer: Self-pay

## 2021-03-14 DIAGNOSIS — Z5329 Procedure and treatment not carried out because of patient's decision for other reasons: Secondary | ICD-10-CM

## 2021-03-14 NOTE — Progress Notes (Signed)
MyChart Video Visit    Virtual Visit via Video Note   This visit type was conducted due to national recommendations for restrictions regarding the COVID-19 Pandemic (e.g. social distancing) in an effort to limit this patient's exposure and mitigate transmission in our community. This patient is at least at moderate risk for complications without adequate follow up. This format is felt to be most appropriate for this patient at this time. Physical exam was limited by quality of the video and audio technology used for the visit.   Patient location:  Provider location:   I discussed the limitations of evaluation and management by telemedicine and the availability of in person appointments. The patient expressed understanding and agreed to proceed.  Patient: Bianca [name]   DOB: Dec 29, 1940   80 y.o. Female  MRN: [id number] Visit Date: 03/14/2021  Today's healthcare provider: Gwyneth Sprout, FNP   No chief complaint on file.  Subjective    HPI  Contacted patient at 1:00 pm no answered received. Phone continuously rang. No voicemail picked up. Called again at 1:18 pm. At 1:20 contacted on cellular device and received voicemail. Mailbox full and could not accept messages.  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) 03/16/2019   Mitral regurgitation 03/16/2019   Elevated troponin    Delirium    Atrial fibrillation with RVR ([place]) 01/21/2019   Underweight due to inadequate caloric intake 12/06/2017   Acute bronchitis with COPD ([place]) 03/29/2017   Osteoporosis  07/02/2016   Tobacco abuse 10/26/2013   Breast cancer of upper-outer quadrant of left female breast ([place]) 03/06/2013   COPD (chronic obstructive pulmonary disease) ([place]) 06/09/2012   Adjustment disorder 11/25/2010   CHRONIC KIDNEY DISEASE STAGE III (MODERATE) 11/13/2009   Depression 05/04/2007   Asthma, chronic, unspecified asthma severity, with acute exacerbation 05/03/2007   HYPERTENSION, BENIGN ESSENTIAL 12/06/2006   ALLERGIC  RHINITIS 12/06/2006   Past Medical History:  Diagnosis Date   Anxiety    Arthritis    Asthma    COPD (chronic obstructive pulmonary disease) (HCC)    HOH (hard of hearing)    Hypertension    Wears dentures    top and partial bottom   Wears glasses    Allergies  Allergen Reactions   Alprazolam Other (See Comments)    Combativeness and aggression   Buspar [Buspirone] Other (See Comments)    Causes evident lethargy and is even more pronounced when taken in conjunction with Remeron   Mirtazapine Other (See Comments)    Causes evident lethargy    Other Other (See Comments)    Sedation- Causes agitation and aggression  Changing patient's "usual" medication regimen usually causes "a lot of issues with her, physically"      Medications: Outpatient Medications Prior to Visit  Medication Sig   acetaminophen (TYLENOL) 500 MG tablet Take 1,000 mg by mouth every 6 (six) hours as needed (for headaches).   ADVAIR DISKUS 100-50 MCG/ACT AEPB 1 puff 2 (two) times daily.   ADVAIR DISKUS 100-50 MCG/DOSE AEPB Inhale 1 puff into the lungs in the morning and  at bedtime. RINSE MOUTH AFTER USE   apixaban (ELIQUIS) 2.5 MG TABS tablet TAKE 1 TABLET BY MOUTH TWICE DAILY   atorvastatin (LIPITOR) 20 MG tablet Take 1 tablet (20 mg total) by mouth daily.   busPIRone (BUSPAR) 7.5 MG tablet Take 7.5 mg by mouth 2 (two) times daily.   Calcium Carb-Cholecalciferol (CALCIUM+D3 PO) Take 1 tablet by mouth daily.   Cholecalciferol (VITAMIN D3) 50 MCG (2000 UT) TABS Take 2,000 Units  by mouth daily.    conjugated estrogens (PREMARIN) vaginal cream Place 1 Applicatorful vaginally daily.   ferrous gluconate (FERGON) 324 MG tablet Take 1 tablet (324 mg total) by mouth 2 (two) times daily with a meal.   fludrocortisone (FLORINEF) 0.1 MG tablet Take 100 mcg by mouth 2 (two) times daily. (Patient not taking: No sig reported)   ivermectin (STROMECTOL) 3 MG TABS tablet Take 150 mcg/kg by mouth in the morning and at bedtime. For 7 days start 11/18/20   lactose free nutrition (BOOST) LIQD Take 237 mLs by mouth 2 (two) times daily between meals.   metoprolol succinate (TOPROL-XL) 25 MG 24 hr tablet Take 1 tablet by mouth daily.   midodrine (PROAMATINE) 5 MG tablet Take 1/2 tablet for systolic blood pressure between 120 - 160.  Take 1 tablet for systolic blood pressure less than 120. (Patient taking differently: Take 2.5-5 mg by mouth See admin instructions. Take 2.5 mg by mouth for systolic B/P between 480-165 and 5 mg for a systolic B/P less than 537)   midodrine (PROAMATINE) 5 MG tablet Take 5 mg by mouth 3 (three) times daily with meals.   PROAIR HFA 108 (90 Base) MCG/ACT inhaler INHALE 1-2 PUFFS INTO THE LUNGS EVERY 4 HOURS AS NEEDED (Patient taking differently: Inhale 1-2 puffs into the lungs every 4 (four) hours as needed for wheezing or shortness of breath.)   pyridostigmine (MESTINON) 60 MG tablet Take 0.5 tablets (30 mg total) by mouth in the morning and at bedtime.   pyridostigmine (MESTINON) 60 MG tablet Take 60 mg by mouth in the morning and at bedtime.   tamsulosin (FLOMAX) 0.4 MG CAPS capsule Take 0.4 mg by mouth daily.   vitamin C (ASCORBIC ACID) 500 MG tablet Take 500 mg by mouth daily.    No facility-administered medications prior to visit.    Review of Systems  Last CBC Lab Results  Component Value Date   WBC 6.1 11/20/2020   HGB 12.6 11/20/2020   HCT 38.8 11/20/2020   MCV 84.3 11/20/2020   MCH 27.4 11/20/2020   RDW 15.7 (H) 11/20/2020   PLT 263 48/27/0786    Last metabolic panel Lab Results  Component Value Date   GLUCOSE 115 (H) 11/20/2020   NA 135 11/20/2020   K 3.6 11/20/2020   CL 104 11/20/2020   CO2 23 11/20/2020   BUN 18 11/20/2020   CREATININE 1.31 (H) 11/20/2020   GFRNONAA 41 (L) 11/20/2020   GFRAA 58 (L) 03/22/2020   CALCIUM 8.7 (L) 11/20/2020   PHOS 4.0 02/20/2020   PROT 6.7 11/20/2020   ALBUMIN 3.4 (L) 11/20/2020   LABGLOB 2.5 03/22/2020   AGRATIO 1.6 03/22/2020   BILITOT 0.6 11/20/2020   ALKPHOS 65 11/20/2020   AST 30 11/20/2020   ALT 19 11/20/2020   ANIONGAP 8 11/20/2020   Last lipids Lab Results  Component Value Date   CHOL 167 07/15/2020   HDL 53 07/15/2020   LDLCALC 99 07/15/2020   LDLDIRECT 94.8 04/24/2010   TRIG 75 07/15/2020  CHOLHDL 3.2 07/15/2020   Last hemoglobin A1c Lab Results  Component Value Date   HGBA1C 5.2 07/15/2020   Last thyroid functions Lab Results  Component Value Date   TSH 2.700 01/30/2020   Last vitamin D Lab Results  Component Value Date   VD25OH 35.94 02/17/2020       Objective    LMP  (LMP Unknown)  BP Readings from Last 3 Encounters:  12/05/20 (!) 100/56  11/20/20 (!) 154/94  09/10/20 112/70   Wt Readings from Last 3 Encounters:  12/05/20 107 lb (48.5 kg)  11/20/20 107 lb (48.5 kg)  09/10/20 105 lb 1.6 oz (47.7 kg)      Physical Exam     Assessment & Plan       No follow-ups on file.     I discussed the assessment and treatment plan with the patient. The patient was provided an opportunity to ask questions and all were answered. The patient agreed with the plan and demonstrated an understanding of the instructions.   The patient was advised to call back or seek an in-person evaluation if the symptoms worsen or if the condition fails to improve as anticipated.  I provided  minutes of non-face-to-face time during this encounter.    Gwyneth Sprout, [place] [phone] (phone) [phone] (fax)  [place]

## 2021-03-17 DIAGNOSIS — Z5329 Procedure and treatment not carried out because of patient's decision for other reasons: Secondary | ICD-10-CM | POA: Insufficient documentation

## 2021-03-17 DIAGNOSIS — Z91199 Patient's noncompliance with other medical treatment and regimen due to unspecified reason: Secondary | ICD-10-CM | POA: Insufficient documentation

## 2021-03-31 ENCOUNTER — Encounter: Payer: Self-pay | Admitting: Family Medicine

## 2021-03-31 ENCOUNTER — Telehealth (INDEPENDENT_AMBULATORY_CARE_PROVIDER_SITE_OTHER): Payer: PPO | Admitting: Family Medicine

## 2021-03-31 DIAGNOSIS — Z789 Other specified health status: Secondary | ICD-10-CM

## 2021-03-31 DIAGNOSIS — R54 Age-related physical debility: Secondary | ICD-10-CM | POA: Diagnosis not present

## 2021-03-31 NOTE — Progress Notes (Addendum)
MyChart Video Visit    Virtual Visit via Video Note   This visit type was conducted due to national recommendations for restrictions regarding the COVID-19 Pandemic (e.g. social distancing) in an effort to limit this patient's exposure and mitigate transmission in our community. This patient is at least at moderate risk for complications without adequate follow up. This format is felt to be most appropriate for this patient at this time. Physical exam was limited by quality of the video and audio technology used for the visit.    Patient location: home Provider location: [place] involved in the visit: patient, provider  I discussed the limitations of evaluation and management by telemedicine and the availability of in person appointments. The patient expressed understanding and agreed to proceed.  Patient: Bianca [name]   DOB: 12-Mar-1941   80 y.o. Female  MRN: [id number] Visit Date: 03/31/2021  Today's healthcare provider: Lavon Paganini, MD   Chief Complaint  Patient presents with   Extremity Weakness   Subjective    Extremity Weakness    Last had home health PT 47m ago Did better when she was doing PT and weakness has worsened since she stopped it again Her cardiologist told her to get back into it Dresses, showers, feeds herself Not independent in cooking - caretaker does this Would like to get back to baseline to get back to independent living  Fall risk and leg weakness   Medications: Outpatient Medications Prior to Visit  Medication Sig   acetaminophen (TYLENOL) 500 MG tablet Take 1,000 mg by mouth every 6 (six) hours as needed (for headaches).   ADVAIR DISKUS 100-50 MCG/ACT AEPB 1 puff 2 (two) times daily.   apixaban (ELIQUIS) 2.5 MG TABS tablet TAKE 1 TABLET BY MOUTH TWICE DAILY   atorvastatin (LIPITOR) 20 MG tablet Take 1 tablet (20 mg total) by mouth daily.   busPIRone (BUSPAR) 7.5 MG tablet Take 7.5 mg by mouth as needed.    Calcium Carb-Cholecalciferol (CALCIUM+D3 PO) Take 1 tablet by mouth daily.   Cholecalciferol (VITAMIN D3) 50 MCG (2000 UT) TABS Take 2,000 Units by mouth daily.    ferrous gluconate (FERGON) 324 MG tablet Take 1 tablet (324 mg total) by mouth 2 (two) times daily with a meal.   lactose free nutrition (BOOST) LIQD Take 237 mLs by mouth 2 (two) times daily between meals.   metoprolol succinate (TOPROL-XL) 25 MG 24 hr tablet Take 1 tablet by mouth daily.   midodrine (PROAMATINE) 5 MG tablet Take 1/2 tablet for systolic blood pressure between 120 - 160.  Take 1 tablet for systolic blood pressure less than 120. (Patient taking differently: Take 2.5-5 mg by mouth See admin instructions. Take 2.5 mg by mouth for systolic B/P between 809-983 and 5 mg for a systolic B/P less than 382)   PROAIR HFA 108 (90 Base) MCG/ACT inhaler INHALE 1-2 PUFFS INTO THE LUNGS EVERY 4 HOURS AS NEEDED (Patient taking differently: Inhale 1-2 puffs into the lungs every 4 (four) hours as needed for wheezing or shortness of breath.)   pyridostigmine (MESTINON) 60 MG tablet Take 0.5 tablets (30 mg total) by mouth in the morning and at bedtime.   tamsulosin (FLOMAX) 0.4 MG CAPS capsule Take 0.4 mg by mouth daily.   ADVAIR DISKUS 100-50 MCG/DOSE AEPB Inhale 1 puff into the lungs in the morning and at bedtime. RINSE MOUTH AFTER USE   midodrine (PROAMATINE) 5 MG tablet Take 5 mg by mouth 3 (three) times daily with  meals.   vitamin C (ASCORBIC ACID) 500 MG tablet Take 500 mg by mouth daily.    [DISCONTINUED] conjugated estrogens (PREMARIN) vaginal cream Place 1 Applicatorful vaginally daily. (Patient not taking: Reported on 03/31/2021)   [DISCONTINUED] fludrocortisone (FLORINEF) 0.1 MG tablet Take 100 mcg by mouth 2 (two) times daily. (Patient not taking: No sig reported)   [DISCONTINUED] ivermectin (STROMECTOL) 3 MG TABS tablet Take 150 mcg/kg by mouth in the morning and at bedtime. For 7 days start 11/18/20 (Patient not taking: Reported on  03/31/2021)   [DISCONTINUED] pyridostigmine (MESTINON) 60 MG tablet Take 60 mg by mouth in the morning and at bedtime.   No facility-administered medications prior to visit.    Review of Systems  Constitutional:  Negative for activity change and appetite change.  Respiratory:  Negative for shortness of breath.   Cardiovascular:  Positive for leg swelling. Negative for chest pain and palpitations.  Musculoskeletal:  Positive for extremity weakness.  Skin:  Negative for rash and wound.  Neurological:  Positive for weakness.     Objective    LMP  (LMP Unknown)    Physical Exam Constitutional:      General: She is not in acute distress.    Appearance: Normal appearance.  HENT:     Head: Normocephalic.  Pulmonary:     Effort: Pulmonary effort is normal. No respiratory distress.  Neurological:     Mental Status: She is alert and oriented to person, place, and time. Mental status is at baseline.       Assessment & Plan     1. Age-related physical debility 2. Decreased activities of daily living (ADL) - needs to resume [facility] PT for strengthening and safety - may benefit from wheelchair eval - needs leg strengthening, needs fall precautions - Ambulatory referral to [place]   Return in about 2 months (around 05/31/2021) for establish care With new PCP.     I discussed the assessment and treatment plan with the patient. The patient was provided an opportunity to ask questions and all were answered. The patient agreed with the plan and demonstrated an understanding of the instructions.   The patient was advised to call back or seek an in-person evaluation if the symptoms worsen or if the condition fails to improve as anticipated.  I, Lavon Paganini, MD, have reviewed all documentation for this visit. The documentation on 03/31/21 for the exam, diagnosis, procedures, and orders are all accurate and complete.   Maude Gloor, Dionne Bucy, MD, MPH [facility]

## 2021-04-03 ENCOUNTER — Other Ambulatory Visit: Payer: Self-pay

## 2021-04-03 DIAGNOSIS — Z96641 Presence of right artificial hip joint: Secondary | ICD-10-CM

## 2021-04-07 DIAGNOSIS — R059 Cough, unspecified: Secondary | ICD-10-CM | POA: Diagnosis not present

## 2021-04-09 DIAGNOSIS — R3914 Feeling of incomplete bladder emptying: Secondary | ICD-10-CM | POA: Diagnosis not present

## 2021-04-09 DIAGNOSIS — N3020 Other chronic cystitis without hematuria: Secondary | ICD-10-CM | POA: Diagnosis not present

## 2021-04-11 ENCOUNTER — Ambulatory Visit: Payer: PPO | Admitting: Family Medicine

## 2021-04-17 DIAGNOSIS — R059 Cough, unspecified: Secondary | ICD-10-CM | POA: Diagnosis not present

## 2021-04-29 ENCOUNTER — Telehealth: Payer: Self-pay

## 2021-04-29 DIAGNOSIS — N1831 Chronic kidney disease, stage 3a: Secondary | ICD-10-CM | POA: Diagnosis not present

## 2021-04-29 DIAGNOSIS — I480 Paroxysmal atrial fibrillation: Secondary | ICD-10-CM | POA: Diagnosis not present

## 2021-04-29 DIAGNOSIS — Z87891 Personal history of nicotine dependence: Secondary | ICD-10-CM | POA: Diagnosis not present

## 2021-04-29 DIAGNOSIS — Z9012 Acquired absence of left breast and nipple: Secondary | ICD-10-CM | POA: Diagnosis not present

## 2021-04-29 DIAGNOSIS — I951 Orthostatic hypotension: Secondary | ICD-10-CM | POA: Diagnosis not present

## 2021-04-29 DIAGNOSIS — J984 Other disorders of lung: Secondary | ICD-10-CM | POA: Diagnosis not present

## 2021-04-29 DIAGNOSIS — I2510 Atherosclerotic heart disease of native coronary artery without angina pectoris: Secondary | ICD-10-CM | POA: Diagnosis not present

## 2021-04-29 DIAGNOSIS — Z23 Encounter for immunization: Secondary | ICD-10-CM | POA: Diagnosis not present

## 2021-04-29 DIAGNOSIS — J441 Chronic obstructive pulmonary disease with (acute) exacerbation: Secondary | ICD-10-CM | POA: Diagnosis not present

## 2021-04-29 NOTE — Telephone Encounter (Signed)
Copied from [place] [id number]. Topic: Referral - Status >> Apr 29, 2021 12:45 PM Yvette Rack wrote: Reason for CRM: Pt daughter stated they have not heard from anyone regarding the referral request for home health services.

## 2021-04-30 NOTE — Telephone Encounter (Signed)
Copied from [place] [phone]. Topic: Referral - Status >> Apr 29, 2021 12:45 PM Yvette Rack wrote: Reason for CRM: Pt daughter stated they have not heard from anyone regarding the referral request for home health services.

## 2021-05-02 NOTE — Telephone Encounter (Signed)
Forwarded message to [employer].

## 2021-05-02 NOTE — Telephone Encounter (Signed)
Note was updated. Let's see if referral team can resubmit for [facility]

## 2021-05-14 DIAGNOSIS — R269 Unspecified abnormalities of gait and mobility: Secondary | ICD-10-CM | POA: Diagnosis not present

## 2021-05-14 DIAGNOSIS — R419 Unspecified symptoms and signs involving cognitive functions and awareness: Secondary | ICD-10-CM | POA: Diagnosis not present

## 2021-05-14 DIAGNOSIS — W19XXXA Unspecified fall, initial encounter: Secondary | ICD-10-CM | POA: Insufficient documentation

## 2021-05-14 DIAGNOSIS — R443 Hallucinations, unspecified: Secondary | ICD-10-CM | POA: Diagnosis not present

## 2021-05-14 DIAGNOSIS — I4891 Unspecified atrial fibrillation: Secondary | ICD-10-CM | POA: Diagnosis not present

## 2021-05-14 DIAGNOSIS — R296 Repeated falls: Secondary | ICD-10-CM | POA: Insufficient documentation

## 2021-05-14 DIAGNOSIS — I951 Orthostatic hypotension: Secondary | ICD-10-CM | POA: Diagnosis not present

## 2021-05-14 DIAGNOSIS — R42 Dizziness and giddiness: Secondary | ICD-10-CM | POA: Diagnosis not present

## 2021-05-14 DIAGNOSIS — G4752 REM sleep behavior disorder: Secondary | ICD-10-CM | POA: Diagnosis not present

## 2021-05-14 IMAGING — CT CT ANGIO CHEST
2 of 6 series · 18 of 46 positions shown · IV contrast (APPLIED)
Comparison: None.

CLINICAL DATA: Syncope.  PE suspected.

EXAM:
CT ANGIOGRAPHY CHEST WITH CONTRAST
TECHNIQUE: Multidetector CT imaging of the chest was performed using the
standard protocol during bolus administration of intravenous
contrast. Multiplanar CT image reconstructions and MIPs were
obtained to evaluate the vascular anatomy.
CONTRAST:  60mL OMNIPAQUE IOHEXOL 350 MG/ML SOLN

[Series 5: thins · axial · 0.58mm/px · z∈[-885,-583]mm · 15 of 332 slices shown]
[im 15/332  lung]
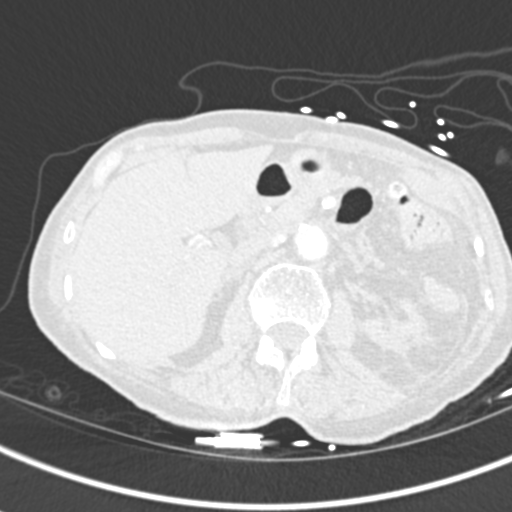
[im 44/332  soft-tissue]
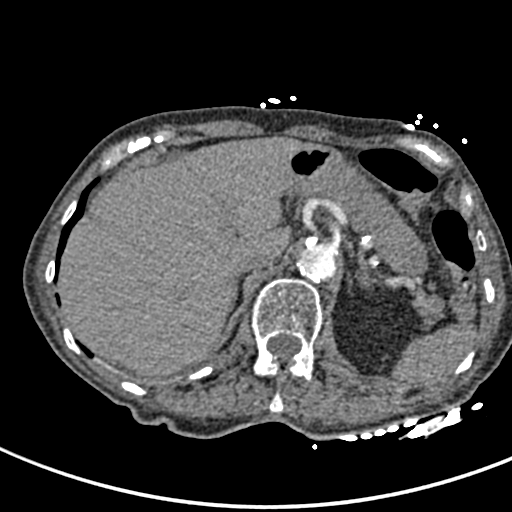
[im 58/332  lung]
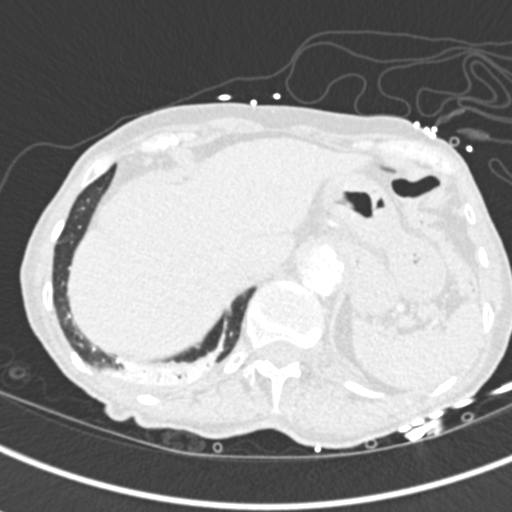
[im 87/332  soft-tissue]
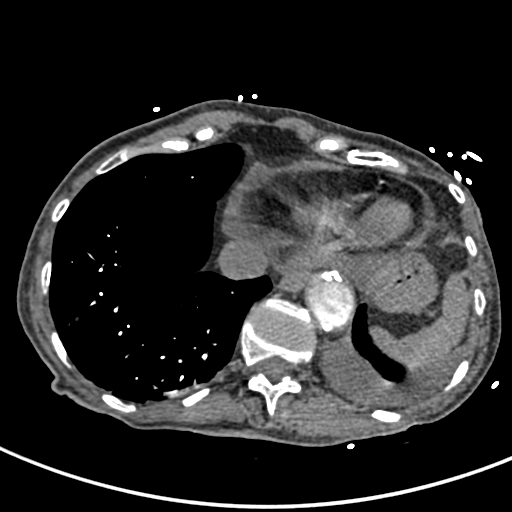
[im 101/332  lung]
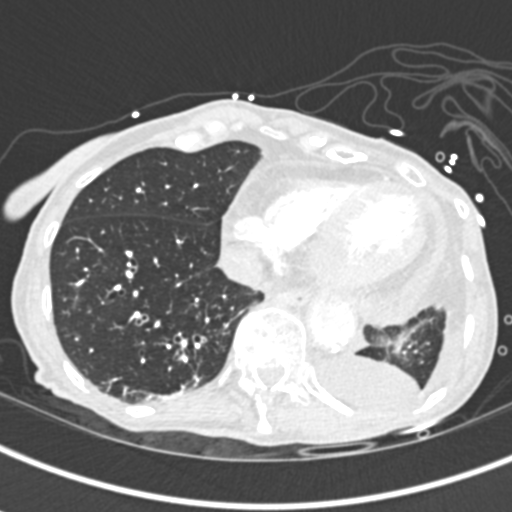
[im 130/332  soft-tissue]
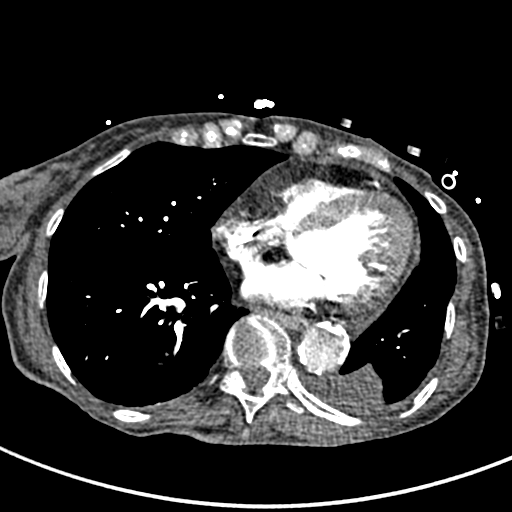
[im 144/332  lung]
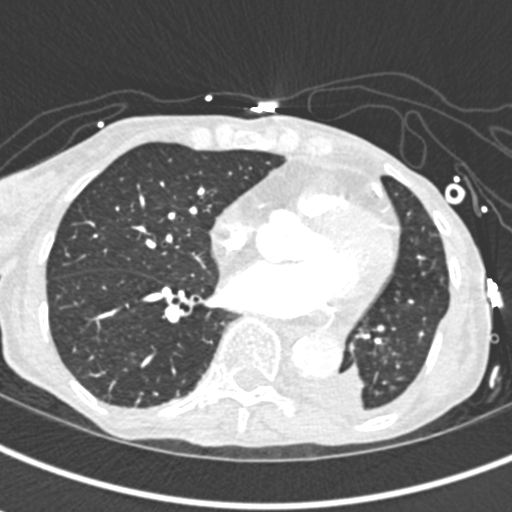
[im 173/332  soft-tissue]
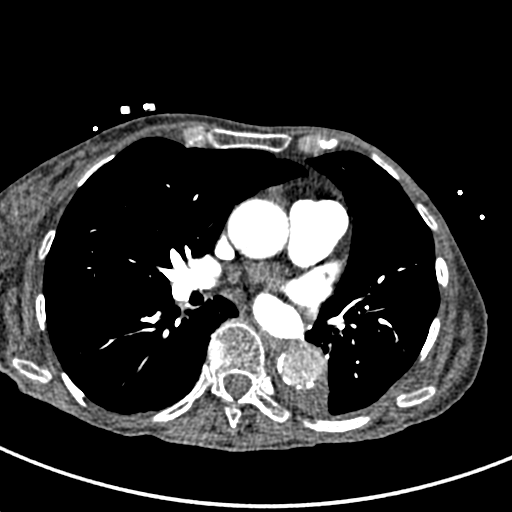
[im 188/332  lung]
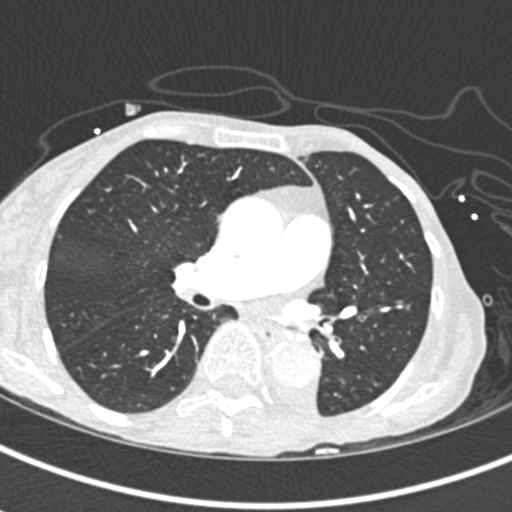
[im 202/332  soft-tissue]
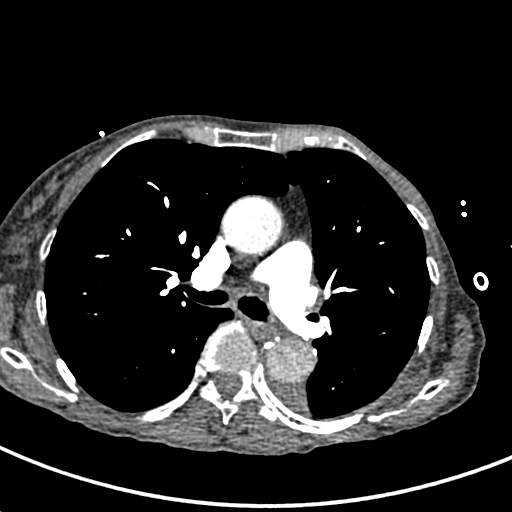
[im 231/332  lung]
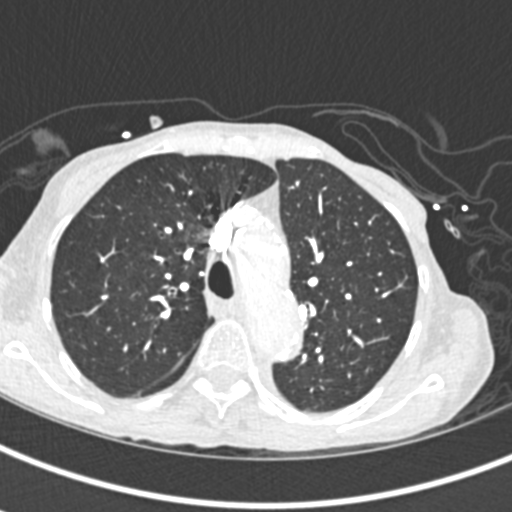
[im 245/332  soft-tissue]
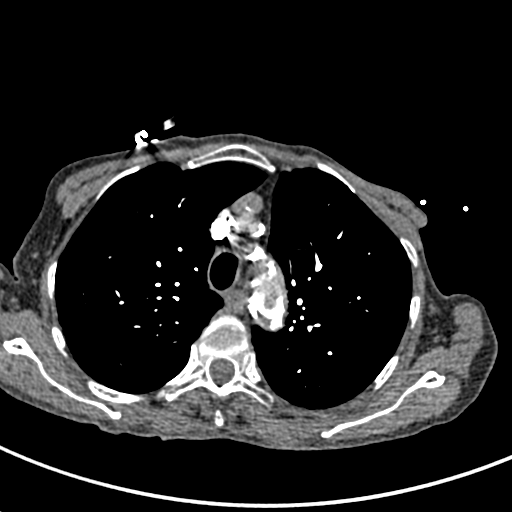
[im 274/332  lung]
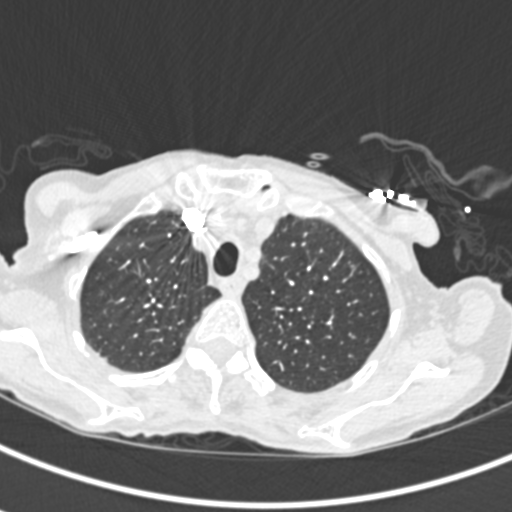
[im 288/332  soft-tissue]
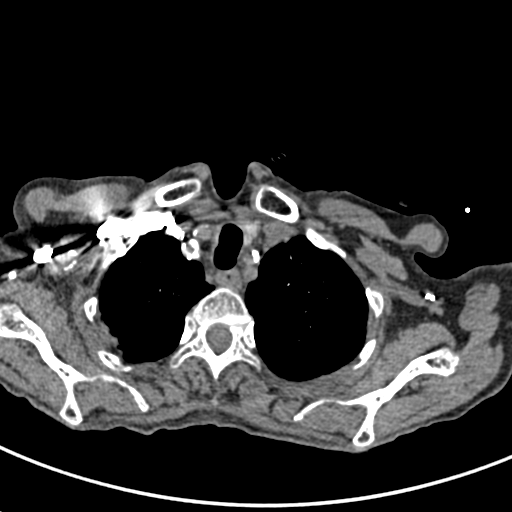
[im 317/332  lung]
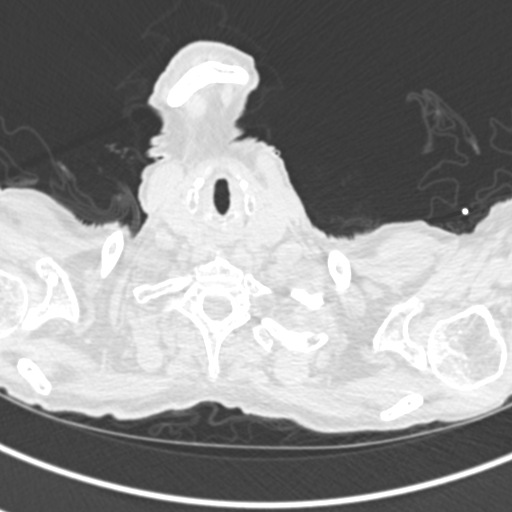

[Series 7: coronal mpr · coronal · 0.60mm/px · 3 of 69 slices shown]
[im 18/69  soft-tissue]
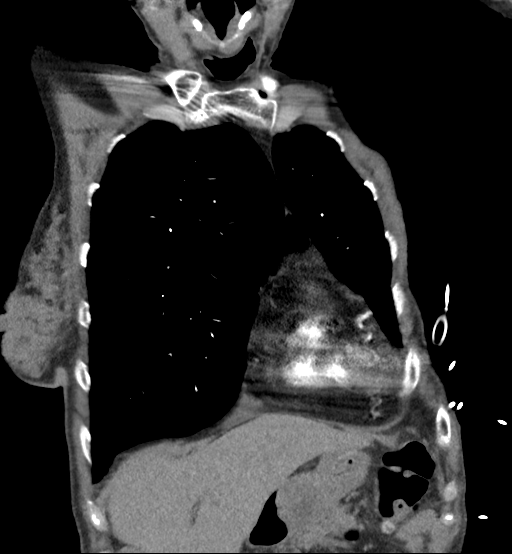
[im 35/69  soft-tissue]
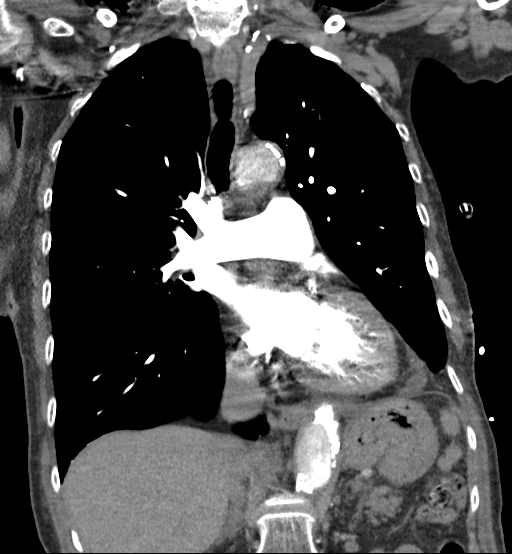
[im 52/69  soft-tissue]
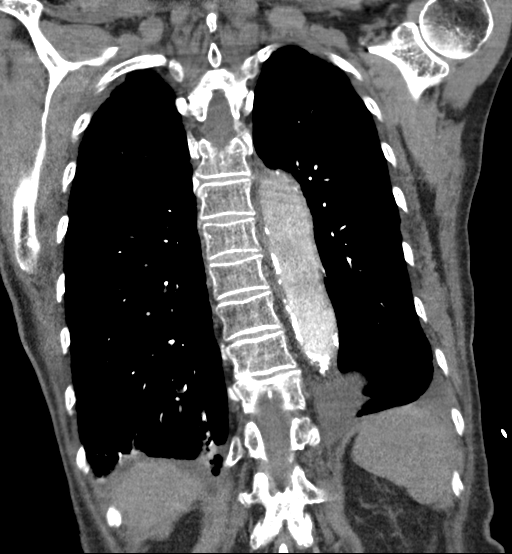

[18 of 46 positions shown; findings below may reference images not displayed]

FINDINGS: Cardiovascular: Heart size upper normal. Small pericardial effusion.
Coronary artery calcification is evident. Atherosclerotic
calcification is noted in the wall of the thoracic aorta. There is
no filling defect within the opacified pulmonary arteries to suggest
the presence of an acute pulmonary embolus.

Mediastinum/Nodes: No mediastinal lymphadenopathy. There is no hilar
lymphadenopathy. The esophagus has normal imaging features. There is
no axillary lymphadenopathy.

Lungs/Pleura: 12 mm nodule posterior right lower lobe ([DATE]
have rim calcification and measures water density anteriorly. This
is immediately adjacent to a plaque-like area of
atelectasis/scarring.

Ill-defined tree-in-bud nodularity seen in the lateral right lower
lobe. Tree-in-bud disease also noted left lower lung with loculated
inferior left pleural effusion. 5 mm nodule noted posterior left
lung base on 60/6.

Left lower lobectomy.

Upper Abdomen: Unremarkable.

Musculoskeletal: T12 compression fracture noted.

Review of the MIP images confirms the above findings.
IMPRESSION: 1. No CT evidence for acute pulmonary embolus.
2. Left lower lobectomy with loculated inferior left effusion.
3. Tree-in-bud disease in the lower lobes bilaterally. Imaging
features likely related to infectious/inflammatory etiology and
atypical infection would be a consideration.
4. 12 mm nodule posterior right lower lobe may have rim
calcification and measures water density anteriorly. This is
indeterminate. Comparison to prior imaging recommended to assess
stability. In the absence of prior imaging, follow-up CT in 3 months
recommended.
5. 5 mm nodule posterior left lung base. This could be reassessed at
the time of follow-up.
6. Aortic Atherosclerosis ([id number]).

## 2021-05-14 IMAGING — CT CT HEAD CODE STROKE
3 series · 15 of 47 positions shown, 18 images · non-contrast
Comparison: 07/15/2020.

CLINICAL DATA: Code stroke.  Neuro deficit, acute stroke suspected

EXAM:
CT HEAD WITHOUT CONTRAST
TECHNIQUE: Contiguous axial images were obtained from the base of the skull
through the vertex without intravenous contrast.

[Series 3: head wo · axial · 0.43mm/px · z∈[-191,-66]mm · 9 of 31 slices shown, 12 images]
[im 3/31  brain]
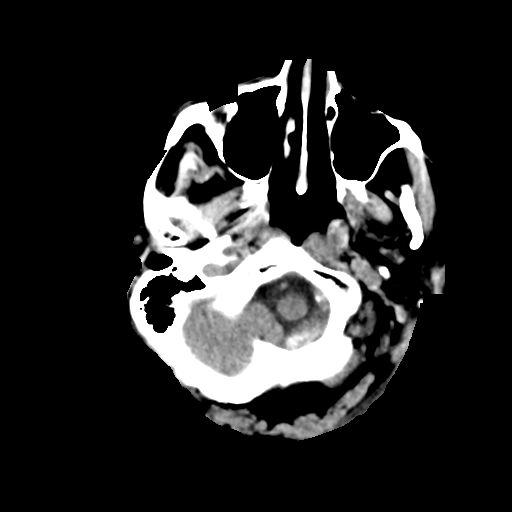
[im 3/31  bone]
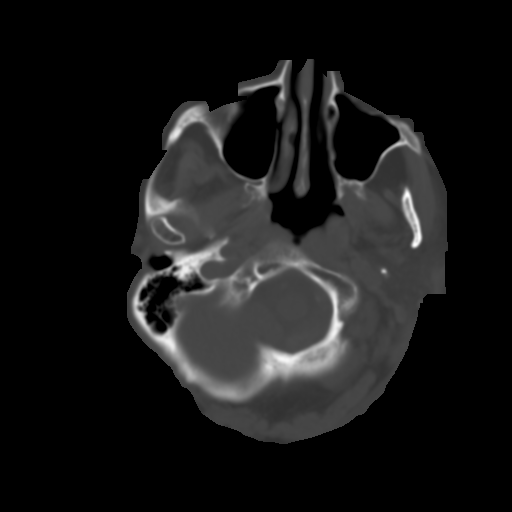
[im 6/31  brain]
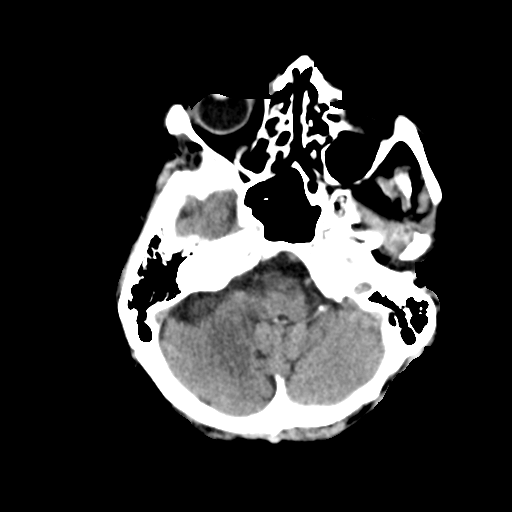
[im 9/31  brain]
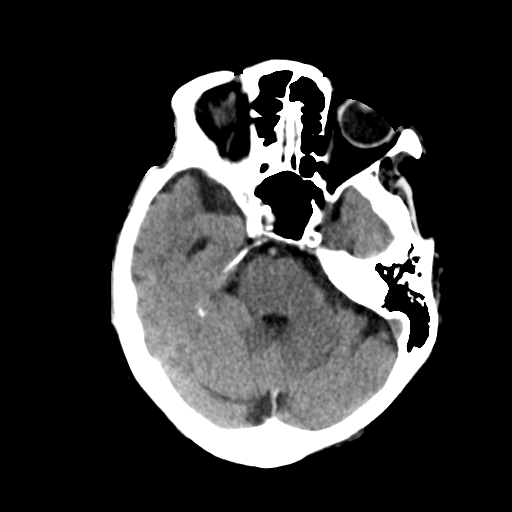
[im 12/31  brain]
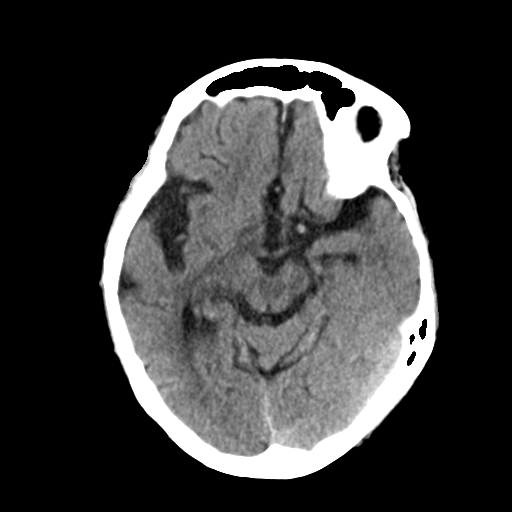
[im 16/31  brain]
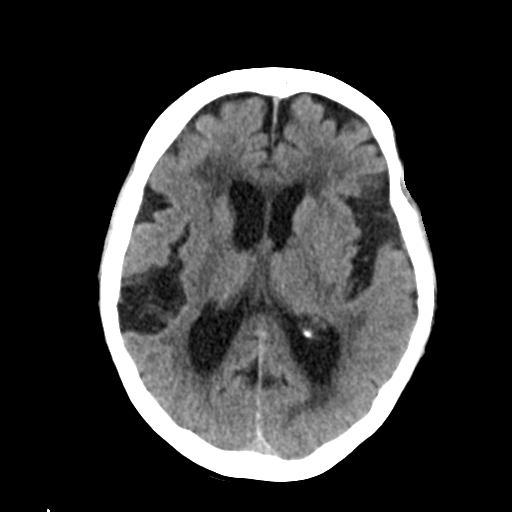
[im 16/31  bone]
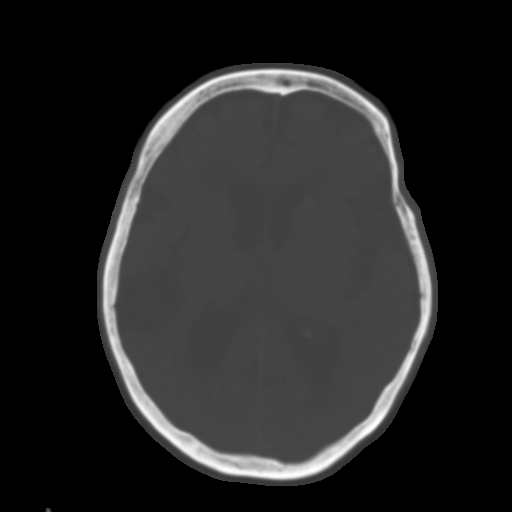
[im 19/31  brain]
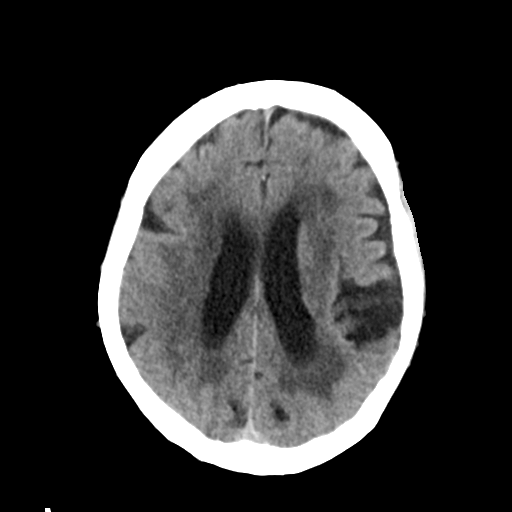
[im 22/31  brain]
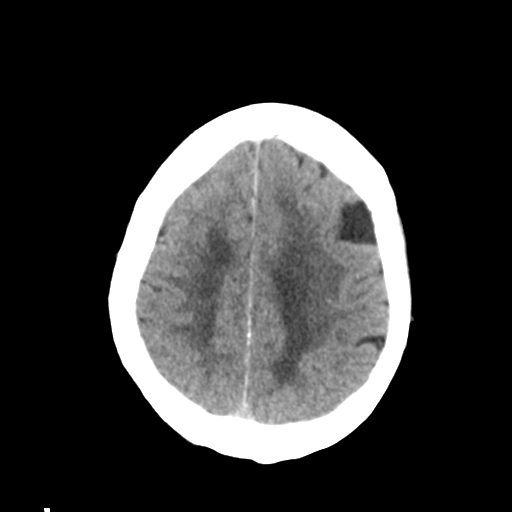
[im 25/31  brain]
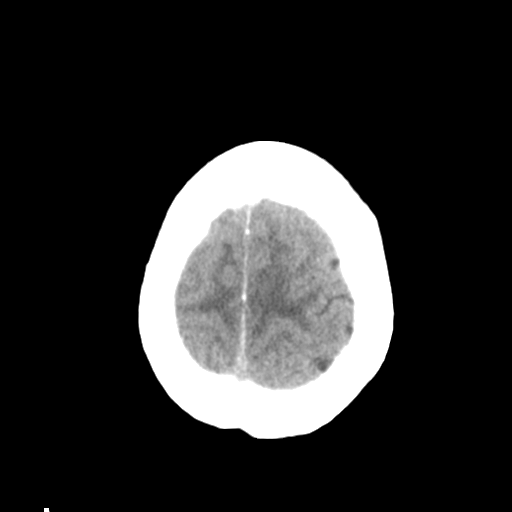
[im 28/31  brain]
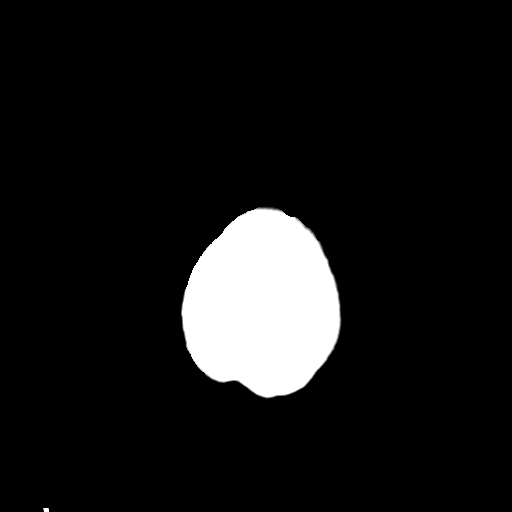
[im 28/31  bone]
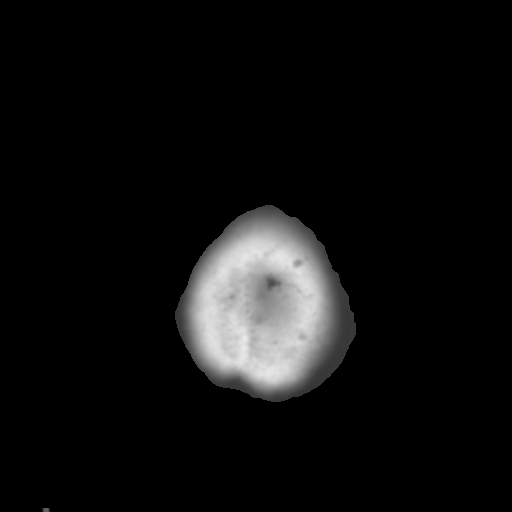

[Series 5: coronal soft tissue · coronal · 0.31mm/px · 3 of 64 slices shown]
[im 22/64  brain]
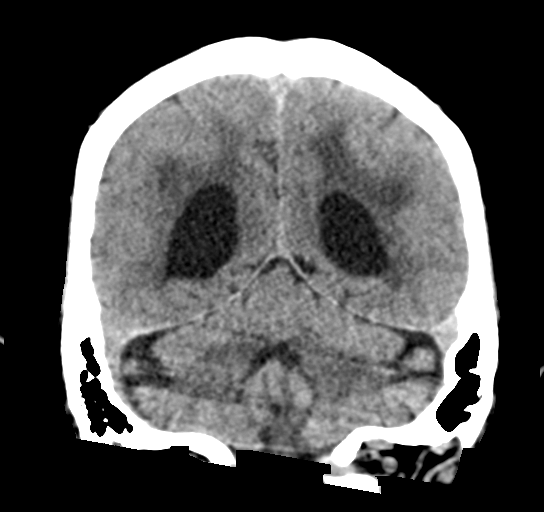
[im 29/64  brain]
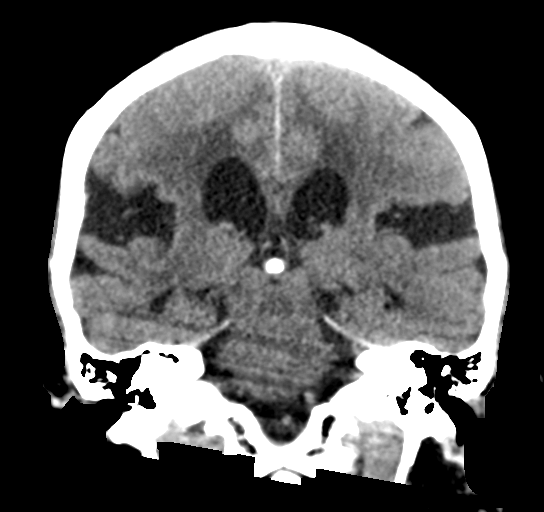
[im 36/64  brain]
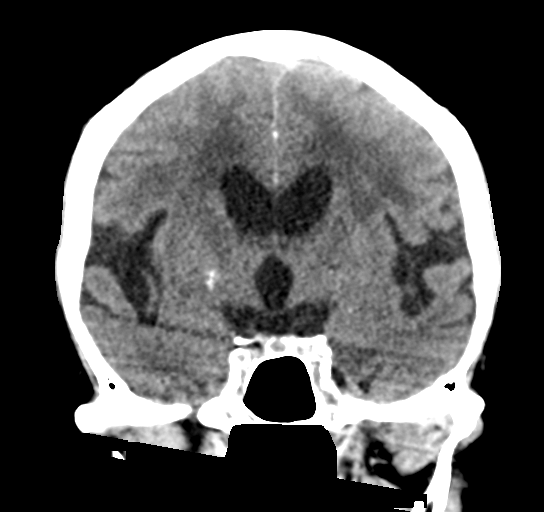

[Series 6: sagittal soft tissue · sagittal · 0.31mm/px · 3 of 53 slices shown]
[im 20/53  brain]
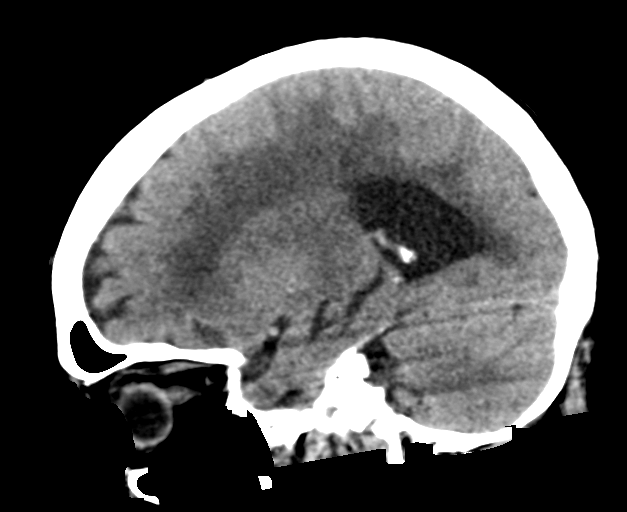
[im 27/53  brain]
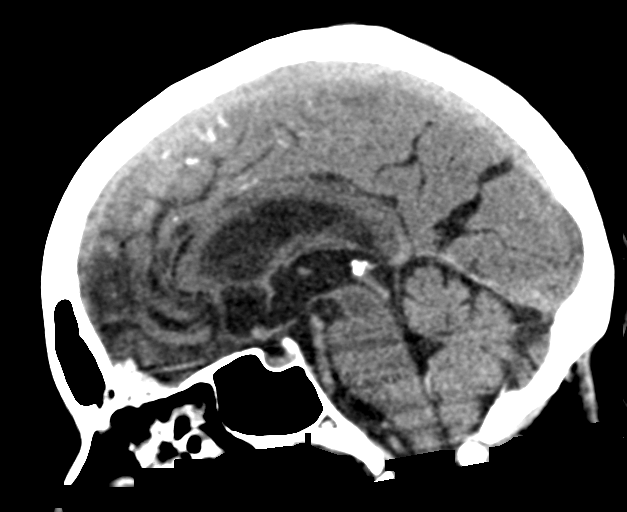
[im 33/53  brain]
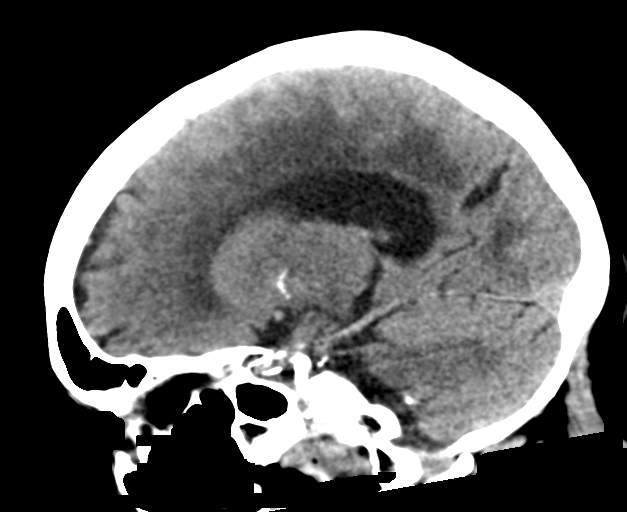

[15 of 47 positions shown; findings below may reference images not displayed]

FINDINGS: Brain: No evidence of acute large vascular territory infarction,
acute hemorrhage, hydrocephalus, extra-axial collection or mass
lesion/mass effect. Similar moderate to advanced patchy white matter
hypoattenuation, likely related to chronic microvascular ischemic
disease. Similar atrophy with ex vacuo ventricular dilation.

Vascular: No hyperdense vessel identified. Calcific atherosclerosis.

Skull: No acute fracture.

Sinuses/Orbits: Mild scattered paranasal sinus mucosal thickening.
No acute orbital abnormality.

Other: No mastoid effusions.

ASPECTS ([place] Stroke Program Early CT Score) Total score (0-10
with 10 being normal): 10.
IMPRESSION: 1. No evidence of acute large vascular territory infarct or acute
hemorrhage. ASPECTS is 10.
2. Moderate to advanced chronic microvascular ischemic disease,
similar to prior.

am to provider Dr. [place] Via telephone, who verbally acknowledged
these results.

## 2021-05-19 ENCOUNTER — Other Ambulatory Visit: Payer: Self-pay | Admitting: Internal Medicine

## 2021-05-19 ENCOUNTER — Other Ambulatory Visit: Payer: Self-pay | Admitting: Family Medicine

## 2021-05-19 NOTE — Telephone Encounter (Signed)
Requested Prescriptions  Pending Prescriptions Disp Refills  . atorvastatin (LIPITOR) 20 MG tablet [Pharmacy Med Name: ATORVASTATIN CALCIUM 20 MG TAB] 90 tablet 0    Sig: TAKE 1 TABLET BY MOUTH DAILY.     Cardiovascular:  Antilipid - Statins Passed - 05/19/2021 12:06 PM      Passed - Total Cholesterol in normal range and within 360 days    Cholesterol, Total  Date Value Ref Range Status  10/30/2019 150 100 - 199 mg/dL Final   Cholesterol  Date Value Ref Range Status  07/15/2020 167 0 - 200 mg/dL Final         Passed - LDL in normal range and within 360 days    LDL Chol Calc (NIH)  Date Value Ref Range Status  10/30/2019 86 0 - 99 mg/dL Final   LDL Cholesterol  Date Value Ref Range Status  07/15/2020 99 0 - 99 mg/dL Final    Comment:           Total Cholesterol/HDL:CHD Risk Coronary Heart Disease Risk Table                     Men   Women  1/2 Average Risk   3.4   3.3  Average Risk       5.0   4.4  2 X Average Risk   9.6   7.1  3 X Average Risk  23.4   11.0        Use the calculated Patient Ratio above and the CHD Risk Table to determine the patient's CHD Risk.        ATP III CLASSIFICATION (LDL):  <100     mg/dL   Optimal  100-129  mg/dL   Near or Above                    Optimal  130-159  mg/dL   Borderline  160-189  mg/dL   High  >190     mg/dL   Very High Performed at [facility] [address], [place], [place] [address]    Direct LDL  Date Value Ref Range Status  04/24/2010 94.8 mg/dL Final    Comment:    See lab report for associated comment(s)         Passed - HDL in normal range and within 360 days    HDL  Date Value Ref Range Status  07/15/2020 53 >40 mg/dL Final  10/30/2019 48 >39 mg/dL Final         Passed - Triglycerides in normal range and within 360 days    Triglycerides  Date Value Ref Range Status  07/15/2020 75 <150 mg/dL Final         Passed - Patient is not pregnant      Passed - Valid encounter within last 12 months     Recent Outpatient Visits          1 month ago Age-related physical debility   [facility] [place], Dionne Bucy, MD   2 months ago No-show for appointment   [facility] Gwyneth Sprout, FNP   2 months ago Erroneous encounter - disregard   [facility] Tally Joe T, FNP   5 months ago Urinary tract infection without hematuria, site unspecified   [facility] Jerrol Banana., MD   8 months ago Complicated UTI (urinary tract infection)   [facility] [place], [place], [place]  Future Appointments            In 2 weeks Gwyneth Sprout, [place], [place]   In 3 weeks Chalmers Cater, Satira Mccallum, PA-C [place], [facility]

## 2021-06-03 ENCOUNTER — Other Ambulatory Visit: Payer: Self-pay

## 2021-06-03 ENCOUNTER — Telehealth (INDEPENDENT_AMBULATORY_CARE_PROVIDER_SITE_OTHER): Payer: PPO | Admitting: Family Medicine

## 2021-06-03 ENCOUNTER — Encounter: Payer: Self-pay | Admitting: Family Medicine

## 2021-06-03 DIAGNOSIS — Z91199 Patient's noncompliance with other medical treatment and regimen due to unspecified reason: Secondary | ICD-10-CM

## 2021-06-03 NOTE — Progress Notes (Signed)
MyChart Video Visit    Virtual Visit via Video Note   This visit type was conducted due to national recommendations for restrictions regarding the COVID-19 Pandemic (e.g. social distancing) in an effort to limit this patient's exposure and mitigate transmission in our community. This patient is at least at moderate risk for complications without adequate follow up. This format is felt to be most appropriate for this patient at this time. Physical exam was limited by quality of the video and audio technology used for the visit.   Patient location: Patient did not answer for virtual video call x3 attempts Provider location: [facility]  I discussed the limitations of evaluation and management by telemedicine and the availability of in person appointments. The patient expressed understanding and agreed to proceed.  Patient: Bianca [name]   DOB: 04-20-1941   80 y.o. Female  MRN: [id number] Visit Date: 06/03/2021  Today's healthcare provider: Gwyneth Sprout, FNP   No chief complaint on file.  Subjective    HPI  Follow up for 1. Age-related physical debility 2. Decreased activities of daily living (ADL)  The patient was last seen for this 2 months ago. Changes made at last visit include advising patient to resume [facility] PT for strengthening and safety.  She reports  compliance with treatment. She feels that condition is . She  having side effects.   -----------------------------------------------------------------------------------------    Medications: Outpatient Medications Prior to Visit  Medication Sig   acetaminophen (TYLENOL) 500 MG tablet Take 1,000 mg by mouth every 6 (six) hours as needed (for headaches).   ADVAIR DISKUS 100-50 MCG/ACT AEPB 1 puff 2 (two) times daily.   ADVAIR DISKUS 100-50 MCG/DOSE AEPB Inhale 1 puff into the lungs in the morning and at bedtime. RINSE MOUTH AFTER USE   apixaban (ELIQUIS) 2.5 MG TABS tablet TAKE 1 TABLET BY MOUTH TWICE DAILY   atorvastatin  (LIPITOR) 20 MG tablet TAKE 1 TABLET BY MOUTH DAILY.   busPIRone (BUSPAR) 7.5 MG tablet Take 7.5 mg by mouth as needed.   Calcium Carb-Cholecalciferol (CALCIUM+D3 PO) Take 1 tablet by mouth daily.   Cholecalciferol (VITAMIN D3) 50 MCG (2000 UT) TABS Take 2,000 Units by mouth daily.    ferrous gluconate (FERGON) 324 MG tablet Take 1 tablet (324 mg total) by mouth 2 (two) times daily with a meal.   lactose free nutrition (BOOST) LIQD Take 237 mLs by mouth 2 (two) times daily between meals.   metoprolol succinate (TOPROL-XL) 25 MG 24 hr tablet Take 1 tablet by mouth daily.   midodrine (PROAMATINE) 5 MG tablet Take 1/2 tablet for systolic blood pressure between 120 - 160.  Take 1 tablet for systolic blood pressure less than 120. (Patient taking differently: Take 2.5-5 mg by mouth See admin instructions. Take 2.5 mg by mouth for systolic B/P between 332-951 and 5 mg for a systolic B/P less than 884)   midodrine (PROAMATINE) 5 MG tablet Take 5 mg by mouth 3 (three) times daily with meals.   PROAIR HFA 108 (90 Base) MCG/ACT inhaler INHALE 1-2 PUFFS INTO THE LUNGS EVERY 4 HOURS AS NEEDED (Patient taking differently: Inhale 1-2 puffs into the lungs every 4 (four) hours as needed for wheezing or shortness of breath.)   pyridostigmine (MESTINON) 60 MG tablet Take 0.5 tablets (30 mg total) by mouth in the morning and at bedtime.   tamsulosin (FLOMAX) 0.4 MG CAPS capsule Take 0.4 mg by mouth daily.   vitamin C (ASCORBIC ACID) 500 MG tablet Take 500  mg by mouth daily.    No facility-administered medications prior to visit.    Review of Systems    Objective    LMP  (LMP Unknown)    Physical Exam     Assessment & Plan       No follow-ups on file.     I discussed the assessment and treatment plan with the patient. The patient was provided an opportunity to ask questions and all were answered. The patient agreed with the plan and demonstrated an understanding of the instructions.   The patient  was advised to call back or seek an in-person evaluation if the symptoms worsen or if the condition fails to improve as anticipated.  I provided  minutes of non-face-to-face time during this encounter.    Gwyneth Sprout, [place] [phone] (phone) [phone] (fax)  [place]

## 2021-06-07 ENCOUNTER — Encounter: Payer: Self-pay | Admitting: Adult Health

## 2021-06-09 NOTE — Telephone Encounter (Signed)
She was previously seen 08/2020 for initial hospital follow-up but apparently was advised to further discuss chronic orthostatic hypotension during that visit. There was a concern for possible Parkinson's disease at that visit and order placed for DAT scan but this was not completed nor followed up as requested (requested f/u with Dr. Leonie Man at that time for further evaluation). Looks like based on recent [facility] neurology note, they referred her to PD multidisciplinary clinic which would be recommend to proceed with. If daughter would like her to f/u in this clinic to further eval for possible Parkinson's disease, PCP can place referral to be seen by Dr. Rexene Alberts for further evaluation as this is not stroke related. Thank you

## 2021-06-09 NOTE — Progress Notes (Signed)
PCP:  Gwyneth Sprout, FNP Primary Cardiologist: Cristopher Peru, MD Electrophysiologist: Cristopher Peru, MD   Bianca [name] is a 80 y.o. female seen today for Cristopher Peru, MD for routine electrophysiology followup.  Since last being seen in our clinic the patient reports doing about the same. Two daughters are present today. She has orthostatic type symptoms most days. BP ranges low 100s to 150s. They feel like she needs a little extra to get through her most active parts of the day. She has tried abdominal binder intermittently, no thigh sleeves. They have midodrine ordered for additional half tablet as needed but have not ever implemented.  she denies chest pain, palpitations, dyspnea, PND, orthopnea, nausea, vomiting, syncope, edema, weight gain, or early satiety. Working on getting PT through PCP  Past Medical History:  Diagnosis Date   Anxiety    Arthritis    Asthma    COPD (chronic obstructive pulmonary disease) ([place])    HOH (hard of hearing)    Hypertension    Wears dentures    top and partial bottom   Wears glasses    Past Surgical History:  Procedure Laterality Date   APPENDECTOMY     BREAST LUMPECTOMY WITH NEEDLE LOCALIZATION Right 04/21/2013   Procedure: BREAST LUMPECTOMY WITH NEEDLE LOCALIZATION;  Surgeon: Harl Bowie, MD;  Location: [place];  Service: General;  Laterality: Right;   BREAST SURGERY  1985   cyst rt br   MASTECTOMY W/ SENTINEL NODE BIOPSY Left 04/21/2013   Procedure: MASTECTOMY WITH SENTINEL LYMPH NODE BIOPSY;  Surgeon: Harl Bowie, MD;  Location: [place];  Service: General;  Laterality: Left;   OTHER SURGICAL HISTORY     cysts removed from foot   OTHER SURGICAL HISTORY     cysts removed from breast   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   Procedure: TOTAL HIP ARTHROPLASTY ANTERIOR APPROACH;  Surgeon: Mcarthur Rossetti, MD;  Location: [facility];  Service: Orthopedics;  Laterality: Right;   TUBAL LIGATION       Current Outpatient Medications  Medication Sig Dispense Refill   acetaminophen (TYLENOL) 500 MG tablet Take 1,000 mg by mouth every 6 (six) hours as needed (for headaches).     ADVAIR DISKUS 100-50 MCG/ACT AEPB 1 puff 2 (two) times daily.     ADVAIR DISKUS 100-50 MCG/DOSE AEPB Inhale 1 puff into the lungs in the morning and at bedtime. RINSE MOUTH AFTER USE 60 each 5   apixaban (ELIQUIS) 2.5 MG TABS tablet TAKE 1 TABLET BY MOUTH TWICE DAILY 60 tablet 5   atorvastatin (LIPITOR) 20 MG tablet TAKE 1 TABLET BY MOUTH DAILY. 90 tablet 0   busPIRone (BUSPAR) 7.5 MG tablet Take 7.5 mg by mouth as needed.     Calcium Carb-Cholecalciferol (CALCIUM+D3 PO) Take 1 tablet by mouth daily.     Cholecalciferol (VITAMIN D3) 50 MCG (2000 UT) TABS Take 2,000 Units by mouth daily.      ferrous gluconate (FERGON) 324 MG tablet Take 1 tablet (324 mg total) by mouth 2 (two) times daily with a meal.  3   lactose free nutrition (BOOST) LIQD Take 237 mLs by mouth 2 (two) times daily between meals.     metoprolol succinate (TOPROL-XL) 25 MG 24 hr tablet Take 1 tablet by mouth daily.     midodrine (PROAMATINE) 5 MG tablet Take 5 mg by mouth 3 (three) times daily with meals.     PROAIR HFA 108 (90 Base) MCG/ACT inhaler INHALE  1-2 PUFFS INTO THE LUNGS EVERY 4 HOURS AS NEEDED 8.5 g 3   pyridostigmine (MESTINON) 60 MG tablet Take 0.5 tablets (30 mg total) by mouth in the morning and at bedtime. 90 tablet 3   tamsulosin (FLOMAX) 0.4 MG CAPS capsule Take 0.4 mg by mouth daily.     vitamin C (ASCORBIC ACID) 500 MG tablet Take 500 mg by mouth daily.      midodrine (PROAMATINE) 5 MG tablet Take 1/2 tablet for systolic blood pressure between 120 - 160.  Take 1 tablet for systolic blood pressure less than 120. (Patient not taking: Reported on 06/10/2021) 270 tablet 3   No current facility-administered medications for this visit.    Allergies  Allergen Reactions   Alprazolam Other (See Comments)    Combativeness and  aggression   Buspar [Buspirone] Other (See Comments)    Causes evident lethargy and is even more pronounced when taken in conjunction with Remeron   Mirtazapine Other (See Comments)    Causes evident lethargy    Other Other (See Comments)    Sedation- Causes agitation and aggression  Changing patient's "usual" medication regimen usually causes "a lot of issues with her, physically"    Social History   Socioeconomic History   Marital status: Widowed    Spouse name: Not on file   Number of children: 2   Years of education: Not on file   Highest education level: Some college, no degree  Occupational History   Occupation: retired    Fish farm manager: RETIRED  Tobacco Use   Smoking status: Former    Packs/day: 0.25    Types: Cigarettes   Smokeless tobacco: Never  Vaping Use   Vaping Use: Never used  Substance and Sexual Activity   Alcohol use: No   Drug use: No   Sexual activity: Not on file    Comment: 1-2 daily  Other Topics Concern   Not on file  Social History Narrative   ** Merged History Encounter **       Has a living will    Social Determinants of Radio broadcast assistant Strain: Not on file  Food Insecurity: Not on file  Transportation Needs: Not on file  Physical Activity: Not on file  Stress: Not on file  Social Connections: Not on file  Intimate Partner Violence: Not on file     Review of Systems: All other systems reviewed and are otherwise negative except as noted above.  Physical Exam: Vitals:   06/10/21 1215  BP: 134/62  Pulse: 62  SpO2: 97%  Weight: 114 lb 9.6 oz (52 kg)  Height: 5\' 6"  (1.676 m)    GEN- The patient is well appearing, alert and oriented x 3 today.   HEENT: normocephalic, atraumatic; sclera clear, conjunctiva pink; hearing intact; oropharynx clear; neck supple, no JVP Lymph- no cervical lymphadenopathy Lungs- Clear to ausculation bilaterally, normal work of breathing.  No wheezes, rales, rhonchi Heart- Regular rate and  rhythm, no murmurs, rubs or gallops, PMI not laterally displaced GI- soft, non-tender, non-distended, bowel sounds present, no hepatosplenomegaly Extremities- no clubbing, cyanosis, or edema; DP/PT/radial pulses 2+ bilaterally MS- no significant deformity or atrophy Skin- warm and dry, no rash or lesion Psych- euthymic mood, full affect Neuro- strength and sensation are intact  EKG is ordered. Personal review of EKG from today shows NSR at 73 bpm,   Additional studies reviewed include: Previous EP office notes.   Assessment and Plan:  1. Autonomic dysfunction - BP overall stable on  mestinon 30 mg BID and miododrine 5 mg TID with extra 2.5 mg prn.   Encouraged thigh sleeps or abdominal binder/spanx Encouraged them to use her extra prn midodrine during her most active period of the day.  Previously failed florinef.  2. Failure to thrive - Weight is stable  Encouraged activity as tolerated.  Awaiting PT  Follow up with Dr. Lovena Le in 6 months   Sanskriti Friar, PA-C  06/10/21 12:35 PM

## 2021-06-10 ENCOUNTER — Telehealth: Payer: Self-pay | Admitting: Family Medicine

## 2021-06-10 ENCOUNTER — Ambulatory Visit: Payer: PPO | Admitting: Student

## 2021-06-10 ENCOUNTER — Encounter: Payer: Self-pay | Admitting: Student

## 2021-06-10 ENCOUNTER — Other Ambulatory Visit: Payer: Self-pay

## 2021-06-10 VITALS — BP 134/62 | HR 62 | Ht 66.0 in | Wt 114.6 lb

## 2021-06-10 DIAGNOSIS — I951 Orthostatic hypotension: Secondary | ICD-10-CM

## 2021-06-10 DIAGNOSIS — I480 Paroxysmal atrial fibrillation: Secondary | ICD-10-CM | POA: Diagnosis not present

## 2021-06-10 MED ORDER — MIDODRINE HCL 5 MG PO TABS: 5.0000 mg | ORAL_TABLET | Freq: Three times a day (TID) | ORAL | 3 refills | Status: DC

## 2021-06-10 NOTE — Patient Instructions (Signed)
Medication Instructions:  Your physician recommends that you continue on your current medications as directed. Please refer to the Current Medication list given to you today.  *If you need a refill on your cardiac medications before your next appointment, please call your pharmacy*   Lab Work: None If you have labs (blood work) drawn today and your tests are completely normal, you will receive your results only by: [place] (if you have MyChart) OR A paper copy in the mail If you have any lab test that is abnormal or we need to change your treatment, we will call you to review the results.   Follow-Up: At [facility], you and your health needs are our priority.  As part of our continuing mission to provide you with exceptional heart care, we have created designated Provider Care Teams.  These Care Teams include your primary Cardiologist (physician) and Advanced Practice Providers (APPs -  Physician Assistants and Nurse Practitioners) who all work together to provide you with the care you need, when you need it.   Your next appointment:   6 month(s)  The format for your next appointment:   In Person  Provider:   You may see Cristopher Peru, MD or one of the following Advanced Practice Providers on your designated Care Team:   Tommye Standard, [place] "[facility]" [place], [place]

## 2021-06-10 NOTE — Telephone Encounter (Signed)
Merleen Nicely from [facility] is requesting an updated order for PT and home health aid. Services were not started back in [place] call back number [phone]

## 2021-06-11 NOTE — Telephone Encounter (Signed)
I dont see a previous order, I see at there office visit with Dr. Brita Romp on 03/31/21 it was mentioned for patient to resume [place] and PT and follow up with you, patient no showed with her video visit with you on 06/03/21. KW

## 2021-06-16 ENCOUNTER — Telehealth: Payer: Self-pay | Admitting: Family Medicine

## 2021-06-16 NOTE — Telephone Encounter (Signed)
Bianca [name] from [facility] is needing a different diagnosis for PT. She would like a call back please.

## 2021-06-16 NOTE — Telephone Encounter (Signed)
Per Merleen Nicely she will need new referral put in for home health. The last was put in for PT and home health aid

## 2021-06-16 NOTE — Telephone Encounter (Signed)
Spoke with Merleen Nicely again who states that she will need a written order for home health and to fax to the number below.  Attn Merleen Nicely [phone]. Will leave order in your box up front for you to sign. KW

## 2021-06-16 NOTE — Telephone Encounter (Signed)
Spoke with Bianca [name] who states when she pulled back patients chart it showed that she was a non-admit, orders not needed at this time. KW

## 2021-06-18 ENCOUNTER — Other Ambulatory Visit: Payer: Self-pay

## 2021-06-18 DIAGNOSIS — Z96641 Presence of right artificial hip joint: Secondary | ICD-10-CM

## 2021-06-18 NOTE — Telephone Encounter (Signed)
Status post total replacement of right hip  - Primary [phone]  Is this diagnosis code good to use? I saw it linked to  encounter from yesterday. KW

## 2021-06-18 NOTE — Telephone Encounter (Signed)
Bianca [name] has called back again. They can not start services until they get a new order,Thanks

## 2021-06-19 NOTE — Telephone Encounter (Signed)
Referral Notes Number of Notes: 3 . Type Date User Summary Attachment  General 06/19/2021 11:17 AM Joella Prince called back stating that services will not start until 06/28/21 due to staffing -  Note   Merleen Nicely called back stating that services will not start until 06/28/21 due to staffing         . Type Date User Summary Attachment  General 06/18/2021 12:04 PM Parke Poisson Per Bernadene Bell from [facility] services will start no later than 06/22/21. She will call if there are any issues -  Note   Per Bernadene Bell from [facility] services will start no later than 06/22/21. She will call if there are any issues         . Type Date User Summary Attachment  Provider Comments 06/18/2021 10:39 AM Dimas, Mikael Spray, [place] Provider Comments -  Note   Please evaluate Bianca [name] for admission to [facility].   Disciplines requested: Physical Therapy and Home Health Aide   Services to provide: Strengthening Exercises   Physician to follow patient's care (the person listed here will be responsible for signing ongoing orders): PCP   Requested Start of Care Date: Within 2-3 days   I certify that this patient is under my care and that I, or a Nurse Practitioner or Physician's Assistant working with me, had a face-to-face encounter that meets the physician face-to-face requirements with patient on 03/31/21. The encounter with the patient was in whole, or in part for the following medical condition(s) which is the primary reason for home health care (List medical condition). Strengthening and safety   Special Instructions:

## 2021-06-20 NOTE — Telephone Encounter (Signed)
Written verbal order has been faxed to [facility] approving home health.KW

## 2021-06-20 NOTE — Telephone Encounter (Signed)
Signed and faxed

## 2021-06-25 DIAGNOSIS — W01198A Fall on same level from slipping, tripping and stumbling with subsequent striking against other object, initial encounter: Secondary | ICD-10-CM | POA: Diagnosis not present

## 2021-06-25 DIAGNOSIS — C3492 Malignant neoplasm of unspecified part of left bronchus or lung: Secondary | ICD-10-CM | POA: Diagnosis not present

## 2021-06-25 DIAGNOSIS — S22050A Wedge compression fracture of T5-T6 vertebra, initial encounter for closed fracture: Secondary | ICD-10-CM | POA: Diagnosis not present

## 2021-06-25 DIAGNOSIS — Z08 Encounter for follow-up examination after completed treatment for malignant neoplasm: Secondary | ICD-10-CM | POA: Diagnosis not present

## 2021-06-25 DIAGNOSIS — Z902 Acquired absence of lung [part of]: Secondary | ICD-10-CM | POA: Diagnosis not present

## 2021-06-25 DIAGNOSIS — Z87891 Personal history of nicotine dependence: Secondary | ICD-10-CM | POA: Diagnosis not present

## 2021-06-25 DIAGNOSIS — Z85118 Personal history of other malignant neoplasm of bronchus and lung: Secondary | ICD-10-CM | POA: Diagnosis not present

## 2021-06-25 DIAGNOSIS — R911 Solitary pulmonary nodule: Secondary | ICD-10-CM | POA: Diagnosis not present

## 2021-06-25 DIAGNOSIS — J90 Pleural effusion, not elsewhere classified: Secondary | ICD-10-CM | POA: Diagnosis not present

## 2021-06-25 DIAGNOSIS — S22000A Wedge compression fracture of unspecified thoracic vertebra, initial encounter for closed fracture: Secondary | ICD-10-CM | POA: Diagnosis not present

## 2021-06-26 ENCOUNTER — Telehealth: Payer: Self-pay | Admitting: Family Medicine

## 2021-06-26 DIAGNOSIS — Z9181 History of falling: Secondary | ICD-10-CM | POA: Diagnosis not present

## 2021-06-26 DIAGNOSIS — J45909 Unspecified asthma, uncomplicated: Secondary | ICD-10-CM | POA: Diagnosis not present

## 2021-06-26 DIAGNOSIS — D6869 Other thrombophilia: Secondary | ICD-10-CM | POA: Diagnosis not present

## 2021-06-26 DIAGNOSIS — Z7901 Long term (current) use of anticoagulants: Secondary | ICD-10-CM | POA: Diagnosis not present

## 2021-06-26 DIAGNOSIS — F419 Anxiety disorder, unspecified: Secondary | ICD-10-CM | POA: Diagnosis not present

## 2021-06-26 DIAGNOSIS — I480 Paroxysmal atrial fibrillation: Secondary | ICD-10-CM | POA: Diagnosis not present

## 2021-06-26 DIAGNOSIS — I2510 Atherosclerotic heart disease of native coronary artery without angina pectoris: Secondary | ICD-10-CM | POA: Diagnosis not present

## 2021-06-26 DIAGNOSIS — Z85118 Personal history of other malignant neoplasm of bronchus and lung: Secondary | ICD-10-CM | POA: Diagnosis not present

## 2021-06-26 DIAGNOSIS — E785 Hyperlipidemia, unspecified: Secondary | ICD-10-CM | POA: Diagnosis not present

## 2021-06-26 DIAGNOSIS — N1831 Chronic kidney disease, stage 3a: Secondary | ICD-10-CM | POA: Diagnosis not present

## 2021-06-26 DIAGNOSIS — E43 Unspecified severe protein-calorie malnutrition: Secondary | ICD-10-CM | POA: Diagnosis not present

## 2021-06-26 DIAGNOSIS — F32A Depression, unspecified: Secondary | ICD-10-CM | POA: Diagnosis not present

## 2021-06-26 DIAGNOSIS — M810 Age-related osteoporosis without current pathological fracture: Secondary | ICD-10-CM | POA: Diagnosis not present

## 2021-06-26 DIAGNOSIS — R339 Retention of urine, unspecified: Secondary | ICD-10-CM | POA: Diagnosis not present

## 2021-06-26 DIAGNOSIS — I129 Hypertensive chronic kidney disease with stage 1 through stage 4 chronic kidney disease, or unspecified chronic kidney disease: Secondary | ICD-10-CM | POA: Diagnosis not present

## 2021-06-26 DIAGNOSIS — H9193 Unspecified hearing loss, bilateral: Secondary | ICD-10-CM | POA: Diagnosis not present

## 2021-06-26 DIAGNOSIS — Z9012 Acquired absence of left breast and nipple: Secondary | ICD-10-CM | POA: Diagnosis not present

## 2021-06-26 DIAGNOSIS — J439 Emphysema, unspecified: Secondary | ICD-10-CM | POA: Diagnosis not present

## 2021-06-26 DIAGNOSIS — I10 Essential (primary) hypertension: Secondary | ICD-10-CM | POA: Diagnosis not present

## 2021-06-26 DIAGNOSIS — Z87440 Personal history of urinary (tract) infections: Secondary | ICD-10-CM | POA: Diagnosis not present

## 2021-06-26 DIAGNOSIS — Z515 Encounter for palliative care: Secondary | ICD-10-CM | POA: Diagnosis not present

## 2021-06-26 DIAGNOSIS — I951 Orthostatic hypotension: Secondary | ICD-10-CM | POA: Diagnosis not present

## 2021-06-26 DIAGNOSIS — G8191 Hemiplegia, unspecified affecting right dominant side: Secondary | ICD-10-CM | POA: Diagnosis not present

## 2021-06-26 DIAGNOSIS — Z96641 Presence of right artificial hip joint: Secondary | ICD-10-CM | POA: Diagnosis not present

## 2021-06-26 DIAGNOSIS — M1990 Unspecified osteoarthritis, unspecified site: Secondary | ICD-10-CM | POA: Diagnosis not present

## 2021-06-26 DIAGNOSIS — Z87891 Personal history of nicotine dependence: Secondary | ICD-10-CM | POA: Diagnosis not present

## 2021-06-26 DIAGNOSIS — Z7951 Long term (current) use of inhaled steroids: Secondary | ICD-10-CM | POA: Diagnosis not present

## 2021-06-26 NOTE — Telephone Encounter (Signed)
[place] Is requesting [facility] PT. Frequency- 1  1, 2 W 2, 1 every 2 weeks times 2 [phone] ok to leave verbal on VM

## 2021-06-26 NOTE — Telephone Encounter (Signed)
Please review.  KP

## 2021-07-01 ENCOUNTER — Telehealth: Payer: Self-pay

## 2021-07-01 DIAGNOSIS — I129 Hypertensive chronic kidney disease with stage 1 through stage 4 chronic kidney disease, or unspecified chronic kidney disease: Secondary | ICD-10-CM | POA: Diagnosis not present

## 2021-07-01 DIAGNOSIS — I2510 Atherosclerotic heart disease of native coronary artery without angina pectoris: Secondary | ICD-10-CM | POA: Diagnosis not present

## 2021-07-01 DIAGNOSIS — G8191 Hemiplegia, unspecified affecting right dominant side: Secondary | ICD-10-CM | POA: Diagnosis not present

## 2021-07-01 DIAGNOSIS — F32A Depression, unspecified: Secondary | ICD-10-CM | POA: Diagnosis not present

## 2021-07-01 DIAGNOSIS — I480 Paroxysmal atrial fibrillation: Secondary | ICD-10-CM | POA: Diagnosis not present

## 2021-07-01 DIAGNOSIS — J439 Emphysema, unspecified: Secondary | ICD-10-CM | POA: Diagnosis not present

## 2021-07-01 DIAGNOSIS — F419 Anxiety disorder, unspecified: Secondary | ICD-10-CM | POA: Diagnosis not present

## 2021-07-01 DIAGNOSIS — H9193 Unspecified hearing loss, bilateral: Secondary | ICD-10-CM | POA: Diagnosis not present

## 2021-07-01 DIAGNOSIS — N1831 Chronic kidney disease, stage 3a: Secondary | ICD-10-CM | POA: Diagnosis not present

## 2021-07-01 DIAGNOSIS — E43 Unspecified severe protein-calorie malnutrition: Secondary | ICD-10-CM | POA: Diagnosis not present

## 2021-07-01 DIAGNOSIS — Z7901 Long term (current) use of anticoagulants: Secondary | ICD-10-CM | POA: Diagnosis not present

## 2021-07-01 DIAGNOSIS — Z9012 Acquired absence of left breast and nipple: Secondary | ICD-10-CM | POA: Diagnosis not present

## 2021-07-01 DIAGNOSIS — Z96641 Presence of right artificial hip joint: Secondary | ICD-10-CM | POA: Diagnosis not present

## 2021-07-01 DIAGNOSIS — Z87440 Personal history of urinary (tract) infections: Secondary | ICD-10-CM | POA: Diagnosis not present

## 2021-07-01 DIAGNOSIS — J45909 Unspecified asthma, uncomplicated: Secondary | ICD-10-CM | POA: Diagnosis not present

## 2021-07-01 DIAGNOSIS — M810 Age-related osteoporosis without current pathological fracture: Secondary | ICD-10-CM | POA: Diagnosis not present

## 2021-07-01 DIAGNOSIS — Z85118 Personal history of other malignant neoplasm of bronchus and lung: Secondary | ICD-10-CM | POA: Diagnosis not present

## 2021-07-01 DIAGNOSIS — I951 Orthostatic hypotension: Secondary | ICD-10-CM | POA: Diagnosis not present

## 2021-07-01 DIAGNOSIS — Z87891 Personal history of nicotine dependence: Secondary | ICD-10-CM | POA: Diagnosis not present

## 2021-07-01 DIAGNOSIS — Z7951 Long term (current) use of inhaled steroids: Secondary | ICD-10-CM | POA: Diagnosis not present

## 2021-07-01 DIAGNOSIS — Z9181 History of falling: Secondary | ICD-10-CM | POA: Diagnosis not present

## 2021-07-01 DIAGNOSIS — R339 Retention of urine, unspecified: Secondary | ICD-10-CM | POA: Diagnosis not present

## 2021-07-01 DIAGNOSIS — E785 Hyperlipidemia, unspecified: Secondary | ICD-10-CM | POA: Diagnosis not present

## 2021-07-01 DIAGNOSIS — M1990 Unspecified osteoarthritis, unspecified site: Secondary | ICD-10-CM | POA: Diagnosis not present

## 2021-07-01 NOTE — Telephone Encounter (Signed)
Verbal approval was left on nurse voicemailbox. KW

## 2021-07-01 NOTE — Telephone Encounter (Signed)
Copied from [employer] [id number]. Topic: General - Other >> Jul 01, 2021  1:57 PM Alanda Slim E wrote: Reason for CRM: Patients PT called and needs diagnoses code for the hip fracture from the patients provider / please advise

## 2021-07-02 NOTE — Telephone Encounter (Signed)
Okay to give PT diagnoses code [phone]? Status post total; replacement of right hip. We didn't see patient for hip fracture he  had a appt scheduled with you on 06/03/21 and was no show and last saw Dr. Brita Romp on 03/31/21 for extremity weakness. KW

## 2021-07-03 NOTE — Telephone Encounter (Signed)
Nurse was advised. KW 

## 2021-07-08 ENCOUNTER — Other Ambulatory Visit: Payer: Self-pay | Admitting: Internal Medicine

## 2021-07-08 NOTE — Telephone Encounter (Signed)
Pt last saw Oda Kilts, [place] 06/10/21, last labs 01/03/21 Creat 1.23, age 80, weight 52kg, based on specified criteria pt is on appropriate dosage of Eliquis 2.5mg  BID for afib.  Will refill rx.

## 2021-07-21 DIAGNOSIS — F32A Depression, unspecified: Secondary | ICD-10-CM | POA: Diagnosis not present

## 2021-07-21 DIAGNOSIS — Z87891 Personal history of nicotine dependence: Secondary | ICD-10-CM | POA: Diagnosis not present

## 2021-07-21 DIAGNOSIS — Z85118 Personal history of other malignant neoplasm of bronchus and lung: Secondary | ICD-10-CM | POA: Diagnosis not present

## 2021-07-21 DIAGNOSIS — N1831 Chronic kidney disease, stage 3a: Secondary | ICD-10-CM | POA: Diagnosis not present

## 2021-07-21 DIAGNOSIS — E43 Unspecified severe protein-calorie malnutrition: Secondary | ICD-10-CM | POA: Diagnosis not present

## 2021-07-21 DIAGNOSIS — I129 Hypertensive chronic kidney disease with stage 1 through stage 4 chronic kidney disease, or unspecified chronic kidney disease: Secondary | ICD-10-CM | POA: Diagnosis not present

## 2021-07-21 DIAGNOSIS — M810 Age-related osteoporosis without current pathological fracture: Secondary | ICD-10-CM | POA: Diagnosis not present

## 2021-07-21 DIAGNOSIS — I480 Paroxysmal atrial fibrillation: Secondary | ICD-10-CM | POA: Diagnosis not present

## 2021-07-21 DIAGNOSIS — G8191 Hemiplegia, unspecified affecting right dominant side: Secondary | ICD-10-CM | POA: Diagnosis not present

## 2021-07-21 DIAGNOSIS — E785 Hyperlipidemia, unspecified: Secondary | ICD-10-CM | POA: Diagnosis not present

## 2021-07-21 DIAGNOSIS — I2510 Atherosclerotic heart disease of native coronary artery without angina pectoris: Secondary | ICD-10-CM | POA: Diagnosis not present

## 2021-07-21 DIAGNOSIS — J45909 Unspecified asthma, uncomplicated: Secondary | ICD-10-CM | POA: Diagnosis not present

## 2021-07-21 DIAGNOSIS — I951 Orthostatic hypotension: Secondary | ICD-10-CM | POA: Diagnosis not present

## 2021-07-21 DIAGNOSIS — Z9181 History of falling: Secondary | ICD-10-CM | POA: Diagnosis not present

## 2021-07-21 DIAGNOSIS — Z7951 Long term (current) use of inhaled steroids: Secondary | ICD-10-CM | POA: Diagnosis not present

## 2021-07-21 DIAGNOSIS — R339 Retention of urine, unspecified: Secondary | ICD-10-CM | POA: Diagnosis not present

## 2021-07-21 DIAGNOSIS — Z96641 Presence of right artificial hip joint: Secondary | ICD-10-CM | POA: Diagnosis not present

## 2021-07-21 DIAGNOSIS — H9193 Unspecified hearing loss, bilateral: Secondary | ICD-10-CM | POA: Diagnosis not present

## 2021-07-21 DIAGNOSIS — Z87440 Personal history of urinary (tract) infections: Secondary | ICD-10-CM | POA: Diagnosis not present

## 2021-07-21 DIAGNOSIS — J439 Emphysema, unspecified: Secondary | ICD-10-CM | POA: Diagnosis not present

## 2021-07-21 DIAGNOSIS — Z9012 Acquired absence of left breast and nipple: Secondary | ICD-10-CM | POA: Diagnosis not present

## 2021-07-21 DIAGNOSIS — F419 Anxiety disorder, unspecified: Secondary | ICD-10-CM | POA: Diagnosis not present

## 2021-07-21 DIAGNOSIS — M1990 Unspecified osteoarthritis, unspecified site: Secondary | ICD-10-CM | POA: Diagnosis not present

## 2021-07-21 DIAGNOSIS — Z7901 Long term (current) use of anticoagulants: Secondary | ICD-10-CM | POA: Diagnosis not present

## 2021-07-22 ENCOUNTER — Telehealth: Payer: Self-pay | Admitting: Family Medicine

## 2021-07-22 NOTE — Telephone Encounter (Signed)
Princess with [facility] aware.

## 2021-07-22 NOTE — Telephone Encounter (Signed)
Princess from [facility] PT called stating the patient missed her 07/11/21 appointment for PT. Patient would like to reschedule this for Tuesday. They need a verbal order to add visit.

## 2021-08-04 ENCOUNTER — Telehealth: Payer: Self-pay | Admitting: Family Medicine

## 2021-08-04 NOTE — Telephone Encounter (Signed)
Copied from [place] [id number]. Topic: Medicare AWV >> Aug 04, 2021 12:09 PM Cher Nakai R wrote: Reason for CRM:  Left message for patient to call back and schedule Medicare Annual Wellness Visit (AWV) in office.   If not able to come in office, please offer to do virtually or by telephone.   Last AWV:11/15/2019  Please schedule at anytime with [facility] Health Advisor.  If any questions, please contact me at [phone]

## 2021-08-07 ENCOUNTER — Other Ambulatory Visit: Payer: Self-pay

## 2021-08-07 ENCOUNTER — Other Ambulatory Visit: Payer: Self-pay | Admitting: Internal Medicine

## 2021-08-07 ENCOUNTER — Telehealth: Payer: Self-pay

## 2021-08-07 MED ORDER — PYRIDOSTIGMINE BROMIDE 60 MG PO TABS: 30.0000 mg | ORAL_TABLET | Freq: Two times a day (BID) | ORAL | 3 refills | Status: DC

## 2021-08-07 NOTE — Telephone Encounter (Signed)
Hi Bianca [name] I got this medication that was in the refill pool and not sure whether to refill or not. Please advised. Thank You.

## 2021-08-15 DIAGNOSIS — R3914 Feeling of incomplete bladder emptying: Secondary | ICD-10-CM | POA: Diagnosis not present

## 2021-08-15 DIAGNOSIS — N3020 Other chronic cystitis without hematuria: Secondary | ICD-10-CM | POA: Diagnosis not present

## 2021-08-15 DIAGNOSIS — N390 Urinary tract infection, site not specified: Secondary | ICD-10-CM | POA: Diagnosis not present

## 2021-08-19 ENCOUNTER — Other Ambulatory Visit: Payer: Self-pay | Admitting: Family Medicine

## 2021-08-21 ENCOUNTER — Telehealth: Payer: Self-pay

## 2021-08-21 DIAGNOSIS — E785 Hyperlipidemia, unspecified: Secondary | ICD-10-CM | POA: Diagnosis not present

## 2021-08-21 DIAGNOSIS — I951 Orthostatic hypotension: Secondary | ICD-10-CM | POA: Diagnosis not present

## 2021-08-21 DIAGNOSIS — I480 Paroxysmal atrial fibrillation: Secondary | ICD-10-CM | POA: Diagnosis not present

## 2021-08-21 DIAGNOSIS — J45909 Unspecified asthma, uncomplicated: Secondary | ICD-10-CM | POA: Diagnosis not present

## 2021-08-21 DIAGNOSIS — Z7901 Long term (current) use of anticoagulants: Secondary | ICD-10-CM | POA: Diagnosis not present

## 2021-08-21 DIAGNOSIS — N1831 Chronic kidney disease, stage 3a: Secondary | ICD-10-CM | POA: Diagnosis not present

## 2021-08-21 DIAGNOSIS — H9193 Unspecified hearing loss, bilateral: Secondary | ICD-10-CM | POA: Diagnosis not present

## 2021-08-21 DIAGNOSIS — Z85118 Personal history of other malignant neoplasm of bronchus and lung: Secondary | ICD-10-CM | POA: Diagnosis not present

## 2021-08-21 DIAGNOSIS — Z9181 History of falling: Secondary | ICD-10-CM | POA: Diagnosis not present

## 2021-08-21 DIAGNOSIS — G8191 Hemiplegia, unspecified affecting right dominant side: Secondary | ICD-10-CM | POA: Diagnosis not present

## 2021-08-21 DIAGNOSIS — Z7951 Long term (current) use of inhaled steroids: Secondary | ICD-10-CM | POA: Diagnosis not present

## 2021-08-21 DIAGNOSIS — I129 Hypertensive chronic kidney disease with stage 1 through stage 4 chronic kidney disease, or unspecified chronic kidney disease: Secondary | ICD-10-CM | POA: Diagnosis not present

## 2021-08-21 DIAGNOSIS — M810 Age-related osteoporosis without current pathological fracture: Secondary | ICD-10-CM | POA: Diagnosis not present

## 2021-08-21 DIAGNOSIS — Z87440 Personal history of urinary (tract) infections: Secondary | ICD-10-CM | POA: Diagnosis not present

## 2021-08-21 DIAGNOSIS — Z96641 Presence of right artificial hip joint: Secondary | ICD-10-CM | POA: Diagnosis not present

## 2021-08-21 DIAGNOSIS — I2510 Atherosclerotic heart disease of native coronary artery without angina pectoris: Secondary | ICD-10-CM | POA: Diagnosis not present

## 2021-08-21 DIAGNOSIS — F419 Anxiety disorder, unspecified: Secondary | ICD-10-CM | POA: Diagnosis not present

## 2021-08-21 DIAGNOSIS — Z87891 Personal history of nicotine dependence: Secondary | ICD-10-CM | POA: Diagnosis not present

## 2021-08-21 DIAGNOSIS — F32A Depression, unspecified: Secondary | ICD-10-CM | POA: Diagnosis not present

## 2021-08-21 DIAGNOSIS — J439 Emphysema, unspecified: Secondary | ICD-10-CM | POA: Diagnosis not present

## 2021-08-21 DIAGNOSIS — E43 Unspecified severe protein-calorie malnutrition: Secondary | ICD-10-CM | POA: Diagnosis not present

## 2021-08-21 DIAGNOSIS — R339 Retention of urine, unspecified: Secondary | ICD-10-CM | POA: Diagnosis not present

## 2021-08-21 DIAGNOSIS — M1990 Unspecified osteoarthritis, unspecified site: Secondary | ICD-10-CM | POA: Diagnosis not present

## 2021-08-21 DIAGNOSIS — Z9012 Acquired absence of left breast and nipple: Secondary | ICD-10-CM | POA: Diagnosis not present

## 2021-08-21 NOTE — Telephone Encounter (Signed)
Copied from [place] [id number]. Topic: Quick Communication - Home Health Verbal Orders >> Aug 21, 2021  1:23 PM Pawlus, Brayton Layman A wrote: Caller/Agency: [facility] home [facility]  Callback Number: [phone] Requesting: PT  Frequency: 1x8  Caller also requested a list of the pts medications be faxed over to [facility] home health, fax [phone]

## 2021-08-21 NOTE — Telephone Encounter (Signed)
I will fax medication list as requested below, okay to approve PT requested by [facility]. KW

## 2021-08-22 NOTE — Telephone Encounter (Signed)
Nurse advised and medication list faxed. KW

## 2021-08-26 ENCOUNTER — Telehealth: Payer: Self-pay | Admitting: Internal Medicine

## 2021-08-26 DIAGNOSIS — D692 Other nonthrombocytopenic purpura: Secondary | ICD-10-CM | POA: Diagnosis not present

## 2021-08-26 DIAGNOSIS — Z515 Encounter for palliative care: Secondary | ICD-10-CM | POA: Diagnosis not present

## 2021-08-26 DIAGNOSIS — Z9181 History of falling: Secondary | ICD-10-CM | POA: Diagnosis not present

## 2021-08-26 DIAGNOSIS — J449 Chronic obstructive pulmonary disease, unspecified: Secondary | ICD-10-CM | POA: Diagnosis not present

## 2021-08-26 DIAGNOSIS — I951 Orthostatic hypotension: Secondary | ICD-10-CM | POA: Diagnosis not present

## 2021-08-26 DIAGNOSIS — I480 Paroxysmal atrial fibrillation: Secondary | ICD-10-CM | POA: Diagnosis not present

## 2021-08-26 DIAGNOSIS — I700 Atherosclerosis of aorta: Secondary | ICD-10-CM | POA: Diagnosis not present

## 2021-08-26 DIAGNOSIS — I129 Hypertensive chronic kidney disease with stage 1 through stage 4 chronic kidney disease, or unspecified chronic kidney disease: Secondary | ICD-10-CM | POA: Diagnosis not present

## 2021-08-26 DIAGNOSIS — N1832 Chronic kidney disease, stage 3b: Secondary | ICD-10-CM | POA: Diagnosis not present

## 2021-08-26 DIAGNOSIS — Z85118 Personal history of other malignant neoplasm of bronchus and lung: Secondary | ICD-10-CM | POA: Diagnosis not present

## 2021-08-26 DIAGNOSIS — D6869 Other thrombophilia: Secondary | ICD-10-CM | POA: Diagnosis not present

## 2021-08-26 NOTE — Telephone Encounter (Signed)
Call placed to [facility] with [place].  Advised that Pt's orthostatic changes were quite significant, and Dr. Lovena Le had declined to make med changes in the past based on a sitting BP.  Brandy did not check a standing BP.  Theadora Rama indicates understanding and advised she "wanted to update [place]".  Call placed to [facility] and updated no changes needed.  She was grateful for call and in agreement.  No action needed.

## 2021-08-26 NOTE — Telephone Encounter (Signed)
Spoke with daughter, [name].  Called to clarify if pt was still seeing Dr. Saralyn Pilar for heart/BP issues as well.  Daughter states pt is not.  They did recheck BP about 5-10 mins ago and it was down to 159/78.  Pt remains asymptomatic.  Advised I will send message to Dr. Lovena Le and his nurse for review.

## 2021-08-26 NOTE — Telephone Encounter (Signed)
° °  Pt c/o BP issue: STAT if pt c/o blurred vision, one-sided weakness or slurred speech  1. What are your last 5 BP readings? 192/118, 200/118  2. Are you having any other symptoms (ex. Dizziness, headache, blurred vision, passed out)? Asymptomatic   3. What is your BP issue? Brandy NP with [facility] called, she said she saw pt today with High BP, initial reading is 192/118 and repeat reading 200/118, she said pt is asymptomatic. She said, pt took midodrine 1 hour prior seeing her today, she advised pt to hold her afternoon dose and continue to monitor BP and if BP is still high later tonight, she also advised to hold the evening dose. She added that pt's caregiver will be monitoring pt's BP. She wanted to know if Dr. Lovena Le has additional recommendation, she said to call her back

## 2021-09-02 NOTE — Telephone Encounter (Signed)
done

## 2021-09-08 ENCOUNTER — Other Ambulatory Visit: Payer: Self-pay | Admitting: Family Medicine

## 2021-09-09 NOTE — Telephone Encounter (Signed)
Requested medication (s) are due for refill today: yes  Requested medication (s) are on the active medication list: yes  Last refill:  11/01/20  Future visit scheduled: no  Notes to clinic:  historical med. Please advise     Requested Prescriptions  Pending Prescriptions Disp Refills   ADVAIR DISKUS 100-50 MCG/ACT AEPB [Pharmacy Med Name: ADVAIR DISKUS 100-50 MCG/ACT INH AE] 60 each     Sig: INHALE ONE PUFF TWICE DAILY AS DIRECTED.*RINSE MOUTH AFTER USE*     Pulmonology:  Combination Products Passed - 09/08/2021  1:16 PM      Passed - Valid encounter within last 12 months    Recent Outpatient Visits           3 months ago No-show for appointment   [facility] Gwyneth Sprout, FNP   5 months ago Age-related physical debility   [facility] Humphrey, Dionne Bucy, MD   5 months ago No-show for appointment   [facility] Gwyneth Sprout, FNP   6 months ago Erroneous encounter - disregard   [facility] Tally Joe T, FNP   9 months ago Urinary tract infection without hematuria, site unspecified   [facility] Jerrol Banana., MD

## 2021-09-12 DIAGNOSIS — Z7901 Long term (current) use of anticoagulants: Secondary | ICD-10-CM | POA: Diagnosis not present

## 2021-09-12 DIAGNOSIS — I480 Paroxysmal atrial fibrillation: Secondary | ICD-10-CM | POA: Diagnosis not present

## 2021-09-12 DIAGNOSIS — M1990 Unspecified osteoarthritis, unspecified site: Secondary | ICD-10-CM | POA: Diagnosis not present

## 2021-09-12 DIAGNOSIS — E43 Unspecified severe protein-calorie malnutrition: Secondary | ICD-10-CM | POA: Diagnosis not present

## 2021-09-12 DIAGNOSIS — J441 Chronic obstructive pulmonary disease with (acute) exacerbation: Secondary | ICD-10-CM | POA: Diagnosis not present

## 2021-09-12 DIAGNOSIS — Z902 Acquired absence of lung [part of]: Secondary | ICD-10-CM | POA: Diagnosis not present

## 2021-09-12 DIAGNOSIS — I951 Orthostatic hypotension: Secondary | ICD-10-CM | POA: Diagnosis not present

## 2021-09-12 DIAGNOSIS — J45909 Unspecified asthma, uncomplicated: Secondary | ICD-10-CM | POA: Diagnosis not present

## 2021-09-12 DIAGNOSIS — E785 Hyperlipidemia, unspecified: Secondary | ICD-10-CM | POA: Diagnosis not present

## 2021-09-12 DIAGNOSIS — F419 Anxiety disorder, unspecified: Secondary | ICD-10-CM | POA: Diagnosis not present

## 2021-09-12 DIAGNOSIS — Z87440 Personal history of urinary (tract) infections: Secondary | ICD-10-CM | POA: Diagnosis not present

## 2021-09-12 DIAGNOSIS — I129 Hypertensive chronic kidney disease with stage 1 through stage 4 chronic kidney disease, or unspecified chronic kidney disease: Secondary | ICD-10-CM | POA: Diagnosis not present

## 2021-09-12 DIAGNOSIS — G8191 Hemiplegia, unspecified affecting right dominant side: Secondary | ICD-10-CM | POA: Diagnosis not present

## 2021-09-12 DIAGNOSIS — Z7951 Long term (current) use of inhaled steroids: Secondary | ICD-10-CM | POA: Diagnosis not present

## 2021-09-12 DIAGNOSIS — Z9012 Acquired absence of left breast and nipple: Secondary | ICD-10-CM | POA: Diagnosis not present

## 2021-09-12 DIAGNOSIS — N1831 Chronic kidney disease, stage 3a: Secondary | ICD-10-CM | POA: Diagnosis not present

## 2021-09-12 DIAGNOSIS — Z96641 Presence of right artificial hip joint: Secondary | ICD-10-CM | POA: Diagnosis not present

## 2021-09-12 DIAGNOSIS — I2510 Atherosclerotic heart disease of native coronary artery without angina pectoris: Secondary | ICD-10-CM | POA: Diagnosis not present

## 2021-09-12 DIAGNOSIS — Z7902 Long term (current) use of antithrombotics/antiplatelets: Secondary | ICD-10-CM | POA: Diagnosis not present

## 2021-09-12 DIAGNOSIS — Z87891 Personal history of nicotine dependence: Secondary | ICD-10-CM | POA: Diagnosis not present

## 2021-09-12 DIAGNOSIS — M810 Age-related osteoporosis without current pathological fracture: Secondary | ICD-10-CM | POA: Diagnosis not present

## 2021-09-12 DIAGNOSIS — F32A Depression, unspecified: Secondary | ICD-10-CM | POA: Diagnosis not present

## 2021-09-12 DIAGNOSIS — H9193 Unspecified hearing loss, bilateral: Secondary | ICD-10-CM | POA: Diagnosis not present

## 2021-09-12 DIAGNOSIS — J439 Emphysema, unspecified: Secondary | ICD-10-CM | POA: Diagnosis not present

## 2021-09-18 DIAGNOSIS — Z7951 Long term (current) use of inhaled steroids: Secondary | ICD-10-CM | POA: Diagnosis not present

## 2021-09-18 DIAGNOSIS — Z902 Acquired absence of lung [part of]: Secondary | ICD-10-CM | POA: Diagnosis not present

## 2021-09-18 DIAGNOSIS — Z87440 Personal history of urinary (tract) infections: Secondary | ICD-10-CM | POA: Diagnosis not present

## 2021-09-18 DIAGNOSIS — F32A Depression, unspecified: Secondary | ICD-10-CM | POA: Diagnosis not present

## 2021-09-18 DIAGNOSIS — I2510 Atherosclerotic heart disease of native coronary artery without angina pectoris: Secondary | ICD-10-CM | POA: Diagnosis not present

## 2021-09-18 DIAGNOSIS — Z7901 Long term (current) use of anticoagulants: Secondary | ICD-10-CM | POA: Diagnosis not present

## 2021-09-18 DIAGNOSIS — M1990 Unspecified osteoarthritis, unspecified site: Secondary | ICD-10-CM | POA: Diagnosis not present

## 2021-09-18 DIAGNOSIS — Z87891 Personal history of nicotine dependence: Secondary | ICD-10-CM | POA: Diagnosis not present

## 2021-09-18 DIAGNOSIS — E43 Unspecified severe protein-calorie malnutrition: Secondary | ICD-10-CM | POA: Diagnosis not present

## 2021-09-18 DIAGNOSIS — Z96641 Presence of right artificial hip joint: Secondary | ICD-10-CM | POA: Diagnosis not present

## 2021-09-18 DIAGNOSIS — J441 Chronic obstructive pulmonary disease with (acute) exacerbation: Secondary | ICD-10-CM | POA: Diagnosis not present

## 2021-09-18 DIAGNOSIS — N1831 Chronic kidney disease, stage 3a: Secondary | ICD-10-CM | POA: Diagnosis not present

## 2021-09-18 DIAGNOSIS — I951 Orthostatic hypotension: Secondary | ICD-10-CM | POA: Diagnosis not present

## 2021-09-18 DIAGNOSIS — I129 Hypertensive chronic kidney disease with stage 1 through stage 4 chronic kidney disease, or unspecified chronic kidney disease: Secondary | ICD-10-CM | POA: Diagnosis not present

## 2021-09-18 DIAGNOSIS — E785 Hyperlipidemia, unspecified: Secondary | ICD-10-CM | POA: Diagnosis not present

## 2021-09-18 DIAGNOSIS — Z9012 Acquired absence of left breast and nipple: Secondary | ICD-10-CM | POA: Diagnosis not present

## 2021-09-18 DIAGNOSIS — F419 Anxiety disorder, unspecified: Secondary | ICD-10-CM | POA: Diagnosis not present

## 2021-09-18 DIAGNOSIS — H9193 Unspecified hearing loss, bilateral: Secondary | ICD-10-CM | POA: Diagnosis not present

## 2021-09-18 DIAGNOSIS — I480 Paroxysmal atrial fibrillation: Secondary | ICD-10-CM | POA: Diagnosis not present

## 2021-09-18 DIAGNOSIS — Z7902 Long term (current) use of antithrombotics/antiplatelets: Secondary | ICD-10-CM | POA: Diagnosis not present

## 2021-09-18 DIAGNOSIS — M810 Age-related osteoporosis without current pathological fracture: Secondary | ICD-10-CM | POA: Diagnosis not present

## 2021-09-18 DIAGNOSIS — G8191 Hemiplegia, unspecified affecting right dominant side: Secondary | ICD-10-CM | POA: Diagnosis not present

## 2021-09-18 DIAGNOSIS — J45909 Unspecified asthma, uncomplicated: Secondary | ICD-10-CM | POA: Diagnosis not present

## 2021-09-18 DIAGNOSIS — J439 Emphysema, unspecified: Secondary | ICD-10-CM | POA: Diagnosis not present

## 2021-10-08 ENCOUNTER — Other Ambulatory Visit: Payer: Self-pay | Admitting: Student

## 2021-10-15 ENCOUNTER — Telehealth: Payer: Self-pay

## 2021-10-15 DIAGNOSIS — N1832 Chronic kidney disease, stage 3b: Secondary | ICD-10-CM | POA: Diagnosis not present

## 2021-10-15 DIAGNOSIS — I129 Hypertensive chronic kidney disease with stage 1 through stage 4 chronic kidney disease, or unspecified chronic kidney disease: Secondary | ICD-10-CM | POA: Diagnosis not present

## 2021-10-15 DIAGNOSIS — J449 Chronic obstructive pulmonary disease, unspecified: Secondary | ICD-10-CM | POA: Diagnosis not present

## 2021-10-15 DIAGNOSIS — Z7951 Long term (current) use of inhaled steroids: Secondary | ICD-10-CM | POA: Diagnosis not present

## 2021-10-15 DIAGNOSIS — Z85118 Personal history of other malignant neoplasm of bronchus and lung: Secondary | ICD-10-CM | POA: Diagnosis not present

## 2021-10-15 DIAGNOSIS — Z515 Encounter for palliative care: Secondary | ICD-10-CM | POA: Diagnosis not present

## 2021-10-15 NOTE — Telephone Encounter (Signed)
Copied from [place] [phone] [id number]. Topic: General - Other ?>> Oct 15, 2021 10:56 AM Camille Bal, Gerlene Burdock wrote: ?Reason for CRM: pts daugter Candace, called in says insurance will only cover generic Advair but Dr Rollene Rotunda wants her on the regular. Please call back with further assistance. ?

## 2021-10-16 ENCOUNTER — Other Ambulatory Visit: Payer: Self-pay | Admitting: Family Medicine

## 2021-10-16 ENCOUNTER — Telehealth: Payer: Self-pay | Admitting: Family Medicine

## 2021-10-16 MED ORDER — FLUTICASONE-SALMETEROL 100-50 MCG/ACT IN AEPB: 1.0000 | INHALATION_SPRAY | Freq: Two times a day (BID) | RESPIRATORY_TRACT | 3 refills | Status: DC

## 2021-10-16 NOTE — Telephone Encounter (Addendum)
Claiborne Billings, a nurse with [employer] [facility] sent a nurse practitioner to the pt's home yesterday.  They are a part of [employer] and the [employer].  ?Claiborne Billings would like an order for a transport chair (not a wheelchair) sent to medical supply company. ?  ?[employer]. Cb [phone] ?

## 2021-10-16 NOTE — Telephone Encounter (Signed)
Patients daughter has been advised. KW ?

## 2021-10-17 NOTE — Telephone Encounter (Signed)
New DME requires a face-to-face visit for reviewing appropriateness and necessity.  Recommend scheduling patient with new PCP for a face-to-face to meet requirements before sending order to DME company. ?

## 2021-10-17 NOTE — Telephone Encounter (Signed)
Patient advised that she needs an appointment for transport chair. Patient reports she will call back to schedule appt.  ?

## 2021-10-21 DIAGNOSIS — Z7951 Long term (current) use of inhaled steroids: Secondary | ICD-10-CM | POA: Diagnosis not present

## 2021-10-21 DIAGNOSIS — G8191 Hemiplegia, unspecified affecting right dominant side: Secondary | ICD-10-CM | POA: Diagnosis not present

## 2021-10-21 DIAGNOSIS — I480 Paroxysmal atrial fibrillation: Secondary | ICD-10-CM | POA: Diagnosis not present

## 2021-10-21 DIAGNOSIS — E785 Hyperlipidemia, unspecified: Secondary | ICD-10-CM | POA: Diagnosis not present

## 2021-10-21 DIAGNOSIS — I2510 Atherosclerotic heart disease of native coronary artery without angina pectoris: Secondary | ICD-10-CM | POA: Diagnosis not present

## 2021-10-21 DIAGNOSIS — Z87440 Personal history of urinary (tract) infections: Secondary | ICD-10-CM | POA: Diagnosis not present

## 2021-10-21 DIAGNOSIS — Z87891 Personal history of nicotine dependence: Secondary | ICD-10-CM | POA: Diagnosis not present

## 2021-10-21 DIAGNOSIS — J45909 Unspecified asthma, uncomplicated: Secondary | ICD-10-CM | POA: Diagnosis not present

## 2021-10-21 DIAGNOSIS — H9193 Unspecified hearing loss, bilateral: Secondary | ICD-10-CM | POA: Diagnosis not present

## 2021-10-21 DIAGNOSIS — M810 Age-related osteoporosis without current pathological fracture: Secondary | ICD-10-CM | POA: Diagnosis not present

## 2021-10-21 DIAGNOSIS — E43 Unspecified severe protein-calorie malnutrition: Secondary | ICD-10-CM | POA: Diagnosis not present

## 2021-10-21 DIAGNOSIS — Z9012 Acquired absence of left breast and nipple: Secondary | ICD-10-CM | POA: Diagnosis not present

## 2021-10-21 DIAGNOSIS — F419 Anxiety disorder, unspecified: Secondary | ICD-10-CM | POA: Diagnosis not present

## 2021-10-21 DIAGNOSIS — J441 Chronic obstructive pulmonary disease with (acute) exacerbation: Secondary | ICD-10-CM | POA: Diagnosis not present

## 2021-10-21 DIAGNOSIS — Z7902 Long term (current) use of antithrombotics/antiplatelets: Secondary | ICD-10-CM | POA: Diagnosis not present

## 2021-10-21 DIAGNOSIS — N1831 Chronic kidney disease, stage 3a: Secondary | ICD-10-CM | POA: Diagnosis not present

## 2021-10-21 DIAGNOSIS — Z96641 Presence of right artificial hip joint: Secondary | ICD-10-CM | POA: Diagnosis not present

## 2021-10-21 DIAGNOSIS — J439 Emphysema, unspecified: Secondary | ICD-10-CM | POA: Diagnosis not present

## 2021-10-21 DIAGNOSIS — I129 Hypertensive chronic kidney disease with stage 1 through stage 4 chronic kidney disease, or unspecified chronic kidney disease: Secondary | ICD-10-CM | POA: Diagnosis not present

## 2021-10-21 DIAGNOSIS — Z7901 Long term (current) use of anticoagulants: Secondary | ICD-10-CM | POA: Diagnosis not present

## 2021-10-21 DIAGNOSIS — I951 Orthostatic hypotension: Secondary | ICD-10-CM | POA: Diagnosis not present

## 2021-10-21 DIAGNOSIS — Z902 Acquired absence of lung [part of]: Secondary | ICD-10-CM | POA: Diagnosis not present

## 2021-10-21 DIAGNOSIS — M1990 Unspecified osteoarthritis, unspecified site: Secondary | ICD-10-CM | POA: Diagnosis not present

## 2021-10-21 DIAGNOSIS — F32A Depression, unspecified: Secondary | ICD-10-CM | POA: Diagnosis not present

## 2021-11-06 DIAGNOSIS — F419 Anxiety disorder, unspecified: Secondary | ICD-10-CM | POA: Diagnosis not present

## 2021-11-06 DIAGNOSIS — Z902 Acquired absence of lung [part of]: Secondary | ICD-10-CM | POA: Diagnosis not present

## 2021-11-06 DIAGNOSIS — F32A Depression, unspecified: Secondary | ICD-10-CM | POA: Diagnosis not present

## 2021-11-06 DIAGNOSIS — J45909 Unspecified asthma, uncomplicated: Secondary | ICD-10-CM | POA: Diagnosis not present

## 2021-11-06 DIAGNOSIS — I480 Paroxysmal atrial fibrillation: Secondary | ICD-10-CM | POA: Diagnosis not present

## 2021-11-06 DIAGNOSIS — M810 Age-related osteoporosis without current pathological fracture: Secondary | ICD-10-CM | POA: Diagnosis not present

## 2021-11-06 DIAGNOSIS — I2510 Atherosclerotic heart disease of native coronary artery without angina pectoris: Secondary | ICD-10-CM | POA: Diagnosis not present

## 2021-11-06 DIAGNOSIS — Z9181 History of falling: Secondary | ICD-10-CM | POA: Diagnosis not present

## 2021-11-06 DIAGNOSIS — Z7951 Long term (current) use of inhaled steroids: Secondary | ICD-10-CM | POA: Diagnosis not present

## 2021-11-06 DIAGNOSIS — Z7901 Long term (current) use of anticoagulants: Secondary | ICD-10-CM | POA: Diagnosis not present

## 2021-11-06 DIAGNOSIS — Z87891 Personal history of nicotine dependence: Secondary | ICD-10-CM | POA: Diagnosis not present

## 2021-11-06 DIAGNOSIS — Z9012 Acquired absence of left breast and nipple: Secondary | ICD-10-CM | POA: Diagnosis not present

## 2021-11-06 DIAGNOSIS — E43 Unspecified severe protein-calorie malnutrition: Secondary | ICD-10-CM | POA: Diagnosis not present

## 2021-11-06 DIAGNOSIS — H9193 Unspecified hearing loss, bilateral: Secondary | ICD-10-CM | POA: Diagnosis not present

## 2021-11-06 DIAGNOSIS — G8191 Hemiplegia, unspecified affecting right dominant side: Secondary | ICD-10-CM | POA: Diagnosis not present

## 2021-11-06 DIAGNOSIS — N1831 Chronic kidney disease, stage 3a: Secondary | ICD-10-CM | POA: Diagnosis not present

## 2021-11-06 DIAGNOSIS — J439 Emphysema, unspecified: Secondary | ICD-10-CM | POA: Diagnosis not present

## 2021-11-06 DIAGNOSIS — I951 Orthostatic hypotension: Secondary | ICD-10-CM | POA: Diagnosis not present

## 2021-11-06 DIAGNOSIS — E785 Hyperlipidemia, unspecified: Secondary | ICD-10-CM | POA: Diagnosis not present

## 2021-11-06 DIAGNOSIS — Z96641 Presence of right artificial hip joint: Secondary | ICD-10-CM | POA: Diagnosis not present

## 2021-11-06 DIAGNOSIS — Z87440 Personal history of urinary (tract) infections: Secondary | ICD-10-CM | POA: Diagnosis not present

## 2021-11-06 DIAGNOSIS — M1990 Unspecified osteoarthritis, unspecified site: Secondary | ICD-10-CM | POA: Diagnosis not present

## 2021-11-06 DIAGNOSIS — I129 Hypertensive chronic kidney disease with stage 1 through stage 4 chronic kidney disease, or unspecified chronic kidney disease: Secondary | ICD-10-CM | POA: Diagnosis not present

## 2021-11-10 ENCOUNTER — Ambulatory Visit (INDEPENDENT_AMBULATORY_CARE_PROVIDER_SITE_OTHER): Payer: PPO

## 2021-11-10 VITALS — Wt 114.0 lb

## 2021-11-10 DIAGNOSIS — C50919 Malignant neoplasm of unspecified site of unspecified female breast: Secondary | ICD-10-CM | POA: Insufficient documentation

## 2021-11-10 DIAGNOSIS — Z0000 Encounter for general adult medical examination without abnormal findings: Secondary | ICD-10-CM | POA: Diagnosis not present

## 2021-11-10 NOTE — Patient Instructions (Signed)
Bianca [name] , ?Thank you for taking time to come for your Medicare Wellness Visit. I appreciate your ongoing commitment to your health goals. Please review the following plan we discussed and let me know if I can assist you in the future.  ? ?Screening recommendations/referrals: ?Colonoscopy: aged out ?Mammogram: aged out ?Bone Density: declined referral  ?Recommended yearly ophthalmology/optometry visit for glaucoma screening and checkup ?Recommended yearly dental visit for hygiene and checkup ? ?Vaccinations: ?Influenza vaccine: 04/10/20 ?Pneumococcal vaccine: 05/03/19 ?Tdap vaccine: 03/27/09, due ?Shingles vaccine: Zostavax 10/06/08   ?Covid-19:n/d ? ?Advanced directives: yes ? ?Conditions/risks identified: none ? ?Next appointment: Follow up in one year for your annual wellness visit 11/12/22 @ 11:30am by phone ? ? ?Preventive Care 14 Years and Older, Female ?Preventive care refers to lifestyle choices and visits with your health care provider that can promote health and wellness. ?What does preventive care include? ?A yearly physical exam. This is also called an annual well check. ?Dental exams once or twice a year. ?Routine eye exams. Ask your health care provider how often you should have your eyes checked. ?Personal lifestyle choices, including: ?Daily care of your teeth and gums. ?Regular physical activity. ?Eating a healthy diet. ?Avoiding tobacco and drug use. ?Limiting alcohol use. ?Practicing safe sex. ?Taking low-dose aspirin every day. ?Taking vitamin and mineral supplements as recommended by your health care provider. ?What happens during an annual well check? ?The services and screenings done by your health care provider during your annual well check will depend on your age, overall health, lifestyle risk factors, and family history of disease. ?Counseling  ?Your health care provider may ask you questions about your: ?Alcohol use. ?Tobacco use. ?Drug use. ?Emotional well-being. ?Home and relationship  well-being. ?Sexual activity. ?Eating habits. ?History of falls. ?Memory and ability to understand (cognition). ?Work and work Statistician. ?Reproductive health. ?Screening  ?You may have the following tests or measurements: ?Height, weight, and BMI. ?Blood pressure. ?Lipid and cholesterol levels. These may be checked every 5 years, or more frequently if you are over 28 years old. ?Skin check. ?Lung cancer screening. You may have this screening every year starting at age 42 if you have a 30-pack-year history of smoking and currently smoke or have quit within the past 15 years. ?Fecal occult blood test (FOBT) of the stool. You may have this test every year starting at age 35. ?Flexible sigmoidoscopy or colonoscopy. You may have a sigmoidoscopy every 5 years or a colonoscopy every 10 years starting at age 54. ?Hepatitis C blood test. ?Hepatitis B blood test. ?Sexually transmitted disease (STD) testing. ?Diabetes screening. This is done by checking your blood sugar (glucose) after you have not eaten for a while (fasting). You may have this done every 1-3 years. ?Bone density scan. This is done to screen for osteoporosis. You may have this done starting at age 33. ?Mammogram. This may be done every 1-2 years. Talk to your health care provider about how often you should have regular mammograms. ?Talk with your health care provider about your test results, treatment options, and if necessary, the need for more tests. ?Vaccines  ?Your health care provider may recommend certain vaccines, such as: ?Influenza vaccine. This is recommended every year. ?Tetanus, diphtheria, and acellular pertussis (Tdap, Td) vaccine. You may need a Td booster every 10 years. ?Zoster vaccine. You may need this after age 59. ?Pneumococcal 13-valent conjugate (PCV13) vaccine. One dose is recommended after age 22. ?Pneumococcal polysaccharide (PPSV23) vaccine. One dose is recommended after age 57. ?Talk to  your health care provider about which  screenings and vaccines you need and how often you need them. ?This information is not intended to replace advice given to you by your health care provider. Make sure you discuss any questions you have with your health care provider. ?Document Released: 08/02/2015 Document Revised: 03/25/2016 Document Reviewed: 05/07/2015 ?Elsevier Interactive Patient Education ? 2017 [employer]. ? ?Fall Prevention in the Home ?Falls can cause injuries. They can happen to people of all ages. There are many things you can do to make your home safe and to help prevent falls. ?What can I do on the outside of my home? ?Regularly fix the edges of walkways and driveways and fix any cracks. ?Remove anything that might make you trip as you walk through a door, such as a raised step or threshold. ?Trim any bushes or trees on the path to your home. ?Use bright outdoor lighting. ?Clear any walking paths of anything that might make someone trip, such as rocks or tools. ?Regularly check to see if handrails are loose or broken. Make sure that both sides of any steps have handrails. ?Any raised decks and porches should have guardrails on the edges. ?Have any leaves, snow, or ice cleared regularly. ?Use sand or salt on walking paths during winter. ?Clean up any spills in your garage right away. This includes oil or grease spills. ?What can I do in the bathroom? ?Use night lights. ?Install grab bars by the toilet and in the tub and shower. Do not use towel bars as grab bars. ?Use non-skid mats or decals in the tub or shower. ?If you need to sit down in the shower, use a plastic, non-slip stool. ?Keep the floor dry. Clean up any water that spills on the floor as soon as it happens. ?Remove soap buildup in the tub or shower regularly. ?Attach bath mats securely with double-sided non-slip rug tape. ?Do not have throw rugs and other things on the floor that can make you trip. ?What can I do in the bedroom? ?Use night lights. ?Make sure that you have a  light by your bed that is easy to reach. ?Do not use any sheets or blankets that are too big for your bed. They should not hang down onto the floor. ?Have a firm chair that has side arms. You can use this for support while you get dressed. ?Do not have throw rugs and other things on the floor that can make you trip. ?What can I do in the kitchen? ?Clean up any spills right away. ?Avoid walking on wet floors. ?Keep items that you use a lot in easy-to-reach places. ?If you need to reach something above you, use a strong step stool that has a grab bar. ?Keep electrical cords out of the way. ?Do not use floor polish or wax that makes floors slippery. If you must use wax, use non-skid floor wax. ?Do not have throw rugs and other things on the floor that can make you trip. ?What can I do with my stairs? ?Do not leave any items on the stairs. ?Make sure that there are handrails on both sides of the stairs and use them. Fix handrails that are broken or loose. Make sure that handrails are as long as the stairways. ?Check any carpeting to make sure that it is firmly attached to the stairs. Fix any carpet that is loose or worn. ?Avoid having throw rugs at the top or bottom of the stairs. If you do have throw rugs, attach  them to the floor with carpet tape. ?Make sure that you have a light switch at the top of the stairs and the bottom of the stairs. If you do not have them, ask someone to add them for you. ?What else can I do to help prevent falls? ?Wear shoes that: ?Do not have high heels. ?Have rubber bottoms. ?Are comfortable and fit you well. ?Are closed at the toe. Do not wear sandals. ?If you use a stepladder: ?Make sure that it is fully opened. Do not climb a closed stepladder. ?Make sure that both sides of the stepladder are locked into place. ?Ask someone to hold it for you, if possible. ?Clearly mark and make sure that you can see: ?Any grab bars or handrails. ?First and last steps. ?Where the edge of each step  is. ?Use tools that help you move around (mobility aids) if they are needed. These include: ?Canes. ?Walkers. ?Scooters. ?Crutches. ?Turn on the lights when you go into a dark area. Replace any light bulbs as soon

## 2021-11-10 NOTE — Progress Notes (Signed)
?Virtual Visit via Telephone Note ? ?I connected with  Bianca [name] on 11/10/21 at  1:15 PM EDT by telephone and verified that I am speaking with the correct person using two identifiers. ? ?Location: ?Patient: home ?Provider: [facility] ?Persons participating in the virtual visit: patient/Nurse Health Advisor ?  ?I discussed the limitations, risks, security and privacy concerns of performing an evaluation and management service by telephone and the availability of in person appointments. The patient expressed understanding and agreed to proceed. ? ?Interactive audio and video telecommunications were attempted between this nurse and patient, however failed, due to patient having technical difficulties OR patient did not have access to video capability.  We continued and completed visit with audio only. ? ?Some vital signs may be absent or patient reported.  ? ?Bianca David, LPN ? ?Subjective:  ? Bianca [name] is a 81 y.o. female who presents for Medicare Annual (Subsequent) preventive examination. ? ?Review of Systems    ? ?  ? ?   ?Objective:  ?  ?There were no vitals filed for this visit. ?There is no height or weight on file to calculate BMI. ? ? ?  07/16/2020  ? 11:30 AM 02/13/2020  ?  1:00 AM 11/15/2019  ?  2:37 PM 01/21/2019  ?  3:34 PM 04/19/2017  ? 11:38 AM 04/17/2013  ? 12:42 PM  ?Advanced Directives  ?Does Patient Have a Medical Advance Directive? Yes Yes Yes No Yes Patient has advance directive, copy not in chart  ?Type of Paramedic of Attorney Living will [place];Living will  [place];Living will Living will;Healthcare Power of Attorney  ?Does patient want to make changes to medical advance directive? No - Patient declined No - Patient declined      ?Copy of [place] in Chart? No - copy requested  No - copy requested  No - copy requested   ?Would patient like information on creating a medical advance directive?    Yes (ED -  Information included in AVS)    ? ? ?Current Medications (verified) ?Outpatient Encounter Medications as of 11/10/2021  ?Medication Sig  ? acetaminophen (TYLENOL) 500 MG tablet Take 1,000 mg by mouth every 6 (six) hours as needed (for headaches).  ? atorvastatin (LIPITOR) 20 MG tablet TAKE 1 TABLET BY MOUTH DAILY.  ? busPIRone (BUSPAR) 7.5 MG tablet Take 7.5 mg by mouth as needed.  ? Calcium Carb-Cholecalciferol (CALCIUM+D3 PO) Take 1 tablet by mouth daily.  ? Cholecalciferol (VITAMIN D3) 50 MCG (2000 UT) TABS Take 2,000 Units by mouth daily.   ? ELIQUIS 2.5 MG TABS tablet TAKE 1 TABLET BY MOUTH TWICE DAILY  ? ferrous gluconate (FERGON) 324 MG tablet Take 1 tablet (324 mg total) by mouth 2 (two) times daily with a meal.  ? fluticasone-salmeterol (ADVAIR) 100-50 MCG/ACT AEPB Inhale 1 puff into the lungs 2 (two) times daily.  ? lactose free nutrition (BOOST) LIQD Take 237 mLs by mouth 2 (two) times daily between meals.  ? metoprolol succinate (TOPROL-XL) 25 MG 24 hr tablet Take 1 tablet by mouth daily.  ? midodrine (PROAMATINE) 5 MG tablet Take 1 tablet (5 mg total) by mouth 3 (three) times daily with meals. May take an extra 1/2 tablet daily as needed.  ? nitrofurantoin, macrocrystal-monohydrate, (MACROBID) 100 MG capsule Take 100 mg by mouth at bedtime.  ? PROAIR HFA 108 (90 Base) MCG/ACT inhaler INHALE 1-2 PUFFS INTO THE LUNGS EVERY 4 HOURS AS NEEDED  ?  pyridostigmine (MESTINON) 60 MG tablet Take 0.5 tablets (30 mg total) by mouth in the morning and at bedtime.  ? tamsulosin (FLOMAX) 0.4 MG CAPS capsule Take 0.4 mg by mouth daily.  ? trimethoprim (TRIMPEX) 100 MG tablet Take 100 mg by mouth daily.  ? vitamin C (ASCORBIC ACID) 500 MG tablet Take 500 mg by mouth daily.   ? ?No facility-administered encounter medications on file as of 11/10/2021.  ? ? ?Allergies (verified) ?Alprazolam, Buspar [buspirone], Mirtazapine, and Other  ? ?History: ?Past Medical History:  ?Diagnosis Date  ? Anxiety   ? Arthritis   ? Asthma   ?  COPD (chronic obstructive pulmonary disease) ([place])   ? HOH (hard of hearing)   ? Hypertension   ? Wears dentures   ? top and partial bottom  ? Wears glasses   ? ?Past Surgical History:  ?Procedure Laterality Date  ? APPENDECTOMY    ? BREAST LUMPECTOMY WITH NEEDLE LOCALIZATION Right 04/21/2013  ? Procedure: BREAST LUMPECTOMY WITH NEEDLE LOCALIZATION;  Surgeon: Harl Bowie, MD;  Location: [place];  Service: General;  Laterality: Right;  ? [place]  ? cyst rt br  ? MASTECTOMY W/ SENTINEL NODE BIOPSY Left 04/21/2013  ? Procedure: MASTECTOMY WITH SENTINEL LYMPH NODE BIOPSY;  Surgeon: Harl Bowie, MD;  Location: [place];  Service: General;  Laterality: Left;  ? OTHER SURGICAL HISTORY    ? cysts removed from foot  ? OTHER SURGICAL HISTORY    ? cysts removed from breast  ? 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 Procedure: TOTAL HIP ARTHROPLASTY ANTERIOR APPROACH;  Surgeon: Mcarthur Rossetti, MD;  Location: [place];  Service: Orthopedics;  Laterality: Right;  ? TUBAL LIGATION    ? ?Family History  ?Problem Relation Age of Onset  ? CAD Mother   ?     possible small heart attack  ? Thyroid disease Sister   ?     she thinks hyperthyroidism  ? ?Social History  ? ?Socioeconomic History  ? Marital status: Widowed  ?  Spouse name: Not on file  ? Number of children: 2  ? Years of education: Not on file  ? Highest education level: Some college, no degree  ?Occupational History  ? Occupation: retired  ?  Employer: RETIRED  ?Tobacco Use  ? Smoking status: Former  ?  Packs/day: 0.25  ?  Types: Cigarettes  ? Smokeless tobacco: Never  ?Vaping Use  ? Vaping Use: Never used  ?Substance and Sexual Activity  ? Alcohol use: No  ? Drug use: No  ? Sexual activity: Not on file  ?  Comment: 1-2 daily  ?Other Topics Concern  ? Not on file  ?Social History Narrative  ? ** Merged History Encounter **  ?    ? Has a living will ?  ? ?Social Determinants of Health  ? ?Financial Resource  Strain: Not on file  ?Food Insecurity: Not on file  ?Transportation Needs: Not on file  ?Physical Activity: Not on file  ?Stress: Not on file  ?Social Connections: Not on file  ? ? ?Tobacco Counseling ?Counseling given: Not Answered ? ? ?Clinical Intake: ? ?Pre-visit preparation completed: Yes ? ?Pain : No/denies pain ? ?  ? ?Nutritional Risks: None ?Diabetes: No ? ?How often do you need to have someone help you when you read instructions, pamphlets, or other written materials from your doctor or pharmacy?: 1 - Never ? ?Diabetic?no ? ?Interpreter Needed?: No ? ?Information  entered by :: Kirke Shaggy LPN ? ? ?Activities of Daily Living ? ?  03/31/2021  ?  1:42 PM  ?In your present state of health, do you have any difficulty performing the following activities:  ?Hearing? 0  ?Vision? 0  ?Difficulty concentrating or making decisions? 0  ?Walking or climbing stairs? 1  ?Dressing or bathing? 0  ?Doing errands, shopping? 1  ? ? ?Patient Care Team: ?Gwyneth Sprout, FNP as PCP - General (Family Medicine) ?Evans Lance, MD as PCP - Cardiology (Cardiology) ?Evans Lance, MD as PCP - Electrophysiology (Cardiology) ?Trinna Post, PA-C (Inactive) (Physician Assistant) ? ?Indicate any recent Medical Services you may have received from other than [facility] providers in the past year (date may be approximate). ? ?   ?Assessment:  ? This is a routine wellness examination for [place]. ? ?Hearing/Vision screen ?No results found. ? ?Dietary issues and exercise activities discussed: ?  ? ? Goals Addressed   ?None ?  ? ?Depression Screen ? ?  03/31/2021  ?  1:43 PM 02/05/2020  ?  1:56 PM 11/15/2019  ?  2:34 PM 10/20/2019  ?  2:32 PM 12/26/2018  ?  9:36 AM 12/15/2018  ?  9:01 AM 07/27/2018  ? 12:52 PM  ?PHQ 2/9 Scores  ?PHQ - 2 Score 2 1 0 0 0 6 0  ?PHQ- 9 Score 5   1 0 12   ?  ?Fall Risk ? ?  03/31/2021  ?  1:47 PM 11/15/2019  ?  2:37 PM 10/20/2019  ?  2:32 PM 02/17/2019  ? 10:05 AM 07/27/2018  ? 12:49 PM  ?Fall Risk   ?Falls in the past year? 0 1  1  0  ?Comment    Emmi Telephone Survey: data to providers prior to load   ?Number falls in past yr: 0 0 0    ?Comment    Emmi Telephone Survey Actual Response =    ?Injury with Fall? 0 0 0    ?Risk

## 2021-11-11 ENCOUNTER — Telehealth: Payer: Self-pay | Admitting: Internal Medicine

## 2021-11-11 NOTE — Telephone Encounter (Signed)
STAT if patient feels like he/she is going to faint  ? ?Are you dizzy now? no ? ?Do you feel faint or have you passed out? Not now. Patient said she felt lightheaded and could have passed out this morning ? ?Do you have any other symptoms? Patient's daughter states dizziness occurs when the patient stands up ? ?Have you checked your HR and BP (record if available)?  ?5:00 am: 85/40  ?8:00 am: 140/80 ?12:30 pm: 197/101 (post midodrine,  taken at 12:30) ? ? ? ?

## 2021-11-11 NOTE — Telephone Encounter (Signed)
Returned call to daughter. ? ?Per daughter Pt seems to be having more lightheadedness/dizziness in the morning. ? ?Daughter states Pt is only drinking water. ? ?Per Dr. Taylor-encouraged Pt to drink more fluids with salt.  Broth, gatorade, etc. ? ?Daughter aware and will try this. ? ?6 mo f/u scheduled. ?

## 2021-11-18 DIAGNOSIS — E43 Unspecified severe protein-calorie malnutrition: Secondary | ICD-10-CM | POA: Diagnosis not present

## 2021-11-18 DIAGNOSIS — Z9012 Acquired absence of left breast and nipple: Secondary | ICD-10-CM | POA: Diagnosis not present

## 2021-11-18 DIAGNOSIS — Z7901 Long term (current) use of anticoagulants: Secondary | ICD-10-CM | POA: Diagnosis not present

## 2021-11-18 DIAGNOSIS — M810 Age-related osteoporosis without current pathological fracture: Secondary | ICD-10-CM | POA: Diagnosis not present

## 2021-11-18 DIAGNOSIS — I129 Hypertensive chronic kidney disease with stage 1 through stage 4 chronic kidney disease, or unspecified chronic kidney disease: Secondary | ICD-10-CM | POA: Diagnosis not present

## 2021-11-18 DIAGNOSIS — Z87891 Personal history of nicotine dependence: Secondary | ICD-10-CM | POA: Diagnosis not present

## 2021-11-18 DIAGNOSIS — J45909 Unspecified asthma, uncomplicated: Secondary | ICD-10-CM | POA: Diagnosis not present

## 2021-11-18 DIAGNOSIS — I951 Orthostatic hypotension: Secondary | ICD-10-CM | POA: Diagnosis not present

## 2021-11-18 DIAGNOSIS — Z96641 Presence of right artificial hip joint: Secondary | ICD-10-CM | POA: Diagnosis not present

## 2021-11-18 DIAGNOSIS — Z7951 Long term (current) use of inhaled steroids: Secondary | ICD-10-CM | POA: Diagnosis not present

## 2021-11-18 DIAGNOSIS — H9193 Unspecified hearing loss, bilateral: Secondary | ICD-10-CM | POA: Diagnosis not present

## 2021-11-18 DIAGNOSIS — Z87440 Personal history of urinary (tract) infections: Secondary | ICD-10-CM | POA: Diagnosis not present

## 2021-11-18 DIAGNOSIS — I2510 Atherosclerotic heart disease of native coronary artery without angina pectoris: Secondary | ICD-10-CM | POA: Diagnosis not present

## 2021-11-18 DIAGNOSIS — J439 Emphysema, unspecified: Secondary | ICD-10-CM | POA: Diagnosis not present

## 2021-11-18 DIAGNOSIS — Z902 Acquired absence of lung [part of]: Secondary | ICD-10-CM | POA: Diagnosis not present

## 2021-11-18 DIAGNOSIS — E785 Hyperlipidemia, unspecified: Secondary | ICD-10-CM | POA: Diagnosis not present

## 2021-11-18 DIAGNOSIS — N1831 Chronic kidney disease, stage 3a: Secondary | ICD-10-CM | POA: Diagnosis not present

## 2021-11-18 DIAGNOSIS — F419 Anxiety disorder, unspecified: Secondary | ICD-10-CM | POA: Diagnosis not present

## 2021-11-18 DIAGNOSIS — M1990 Unspecified osteoarthritis, unspecified site: Secondary | ICD-10-CM | POA: Diagnosis not present

## 2021-11-18 DIAGNOSIS — G8191 Hemiplegia, unspecified affecting right dominant side: Secondary | ICD-10-CM | POA: Diagnosis not present

## 2021-11-18 DIAGNOSIS — F32A Depression, unspecified: Secondary | ICD-10-CM | POA: Diagnosis not present

## 2021-11-18 DIAGNOSIS — Z9181 History of falling: Secondary | ICD-10-CM | POA: Diagnosis not present

## 2021-11-18 DIAGNOSIS — I480 Paroxysmal atrial fibrillation: Secondary | ICD-10-CM | POA: Diagnosis not present

## 2021-12-04 ENCOUNTER — Other Ambulatory Visit: Payer: Self-pay | Admitting: Internal Medicine

## 2021-12-04 ENCOUNTER — Telehealth: Payer: Self-pay | Admitting: Internal Medicine

## 2021-12-04 DIAGNOSIS — I480 Paroxysmal atrial fibrillation: Secondary | ICD-10-CM

## 2021-12-04 MED ORDER — APIXABAN 2.5 MG PO TABS: 2.5000 mg | ORAL_TABLET | Freq: Two times a day (BID) | ORAL | 1 refills | Status: DC

## 2021-12-04 NOTE — Telephone Encounter (Signed)
Patient has fallen several times recently. She states that it is due to her blood pressure dropping. She has not passed out or injured herself. She does take her BP regularly but does not have any readings with her. She states she has not had any symptoms prior to fallings. Denies dizziness or lightheadedness. Advised patient she can take an extra 1/2 tablet of midodrine as needed. Encouraged patient to increase fluid and salt intake. Advised to make sure she changes positions slowly. She has FU scheduled 5/31.

## 2021-12-04 NOTE — Telephone Encounter (Signed)
STAT if patient feels like he/she is going to faint   Are you dizzy now? no  Do you feel faint or have you passed out? Patient has lost her balance and has fallen a couple times but has not passed out    Do you have any other symptoms? Sore tailbone (from falling)  Have you checked your HR and BP (record if available)? No  Patient wanted to check with Dr. Lovena Le to see if she can take an extra dose of the midodrine (PROAMATINE) 5 MG tablet

## 2021-12-04 NOTE — Telephone Encounter (Signed)
Prescription refill request for Eliquis received. Indication: Afib  Last office visit: 06/10/21 Bianca [name]) Scr: 1.23 (01/03/21)  Age: 81 Weight: 51.7kg  Appropriate dose and refill sent to requested pharmacy.

## 2021-12-04 NOTE — Telephone Encounter (Signed)
Prescription refill request for Eliquis received. Indication: PAF Last office visit: 06/10/21  Claudina Lick PA-C Scr: 1.23 on 01/03/21 Age: 81 Weight: 52kg  Based on above findings Eliquis 2.5mg  twice daily is the appropriate dose.  Refill approved.  Pt has appt to see MD 12/17/21.  Requested labs be done at that time.

## 2021-12-05 ENCOUNTER — Ambulatory Visit: Payer: PPO | Admitting: Physician Assistant

## 2021-12-11 DIAGNOSIS — N3020 Other chronic cystitis without hematuria: Secondary | ICD-10-CM | POA: Diagnosis not present

## 2021-12-11 DIAGNOSIS — R350 Frequency of micturition: Secondary | ICD-10-CM | POA: Diagnosis not present

## 2021-12-11 DIAGNOSIS — R3914 Feeling of incomplete bladder emptying: Secondary | ICD-10-CM | POA: Diagnosis not present

## 2021-12-16 NOTE — Progress Notes (Unsigned)
Cardiology Office Note Date:  12/17/2021  Patient ID:  Bianca, [name] [birth year], MRN [id number] PCP:  Gwyneth Sprout, FNP  Cardiologist:  Dr. Lovena Le    Chief Complaint:  annual visit  History of Present Illness: Bianca [name] is a 81 y.o. female with history of orthostatic hypotension/autonomic dysfunction, HTN, AFib, COPD, asthma, anxiety, smoker.  She sawDr. Lovena Le, 09/2019, she was doing well, maintaining SR on flecainide, advised to check BP prior to taking midodrine with elevated BP  In the interim has been found with a lung mass  She was admitted to [facility] 02/11/2020 after a fall (uncertain mechanism, pt did not think she fainted)and hip fracture.  She had some post anesthesia delirium, though otherwise unremarkable post op course.  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 Despite compression strategies remained markedly orthostatic, mestinon was added to her midodrine which had to be reduced 2/2 supine HTN. AM cortisol level was normal, carotid US without obstructive disease. Orthostatic BPs improved She was discharged 02/23/2020  I saw her Sept 2021 She is accompanied by her daughter. She reports her fall as possibly syncope, she had been seated on the porch and had gotten up to go inside, her daughter ahead of her to keep the dogs inside, and she fell of the side of he deck, not terribly high only a foot or so off the ground.  She does not recall "waking up" necessarily, but assumes she fainted. She has done quite well since her discharge, is tolerating PT well, and her daughter mentions she has even stood for several minutes without getting dizzy. She does not have any cardiac awareness, no palpitations or CP. She does not wear the support stocking or abdominal binder, even though her daughter notes they helped her quite a bit. She has not  had near syncope or syncope since home on the current regime. She has not smoked since her discharge.  She is pending thoracoscopy and biopsy at [facility], scheduled for 04/11/20. They have instructed her to stop her Eliquis on the 18th. She denies any bleeding or signs of bleeding No changes were made   left lower lobectomy 04/11/2020 for lung cancer  She saw Dr. Lovena Le  06/18/20 no syncope,  with some instructions on midodrine use/management  07/23/20: was post TIA, ER visit with high BP's mestinon was stopped there though at her visit with him, fatigued, BP low again and mestinon restarted  She has seen Dr. Lorelei Pont a few times since then, most recently 04/29/21, on midodrine, mestinon AND as well as Toprol for HTN She had been in the ER with UTI associated hallucinations. Her BP better, orthostatic hypotension felt multifactorial in a patient who is frail, malnourished, underlying COPD, status post recent left lower lobectomy She was pending a neuro evaluation with some question of Parkinson's Encouarged compression socks Planned o see her in 4 mo  Saw A. Tillery, PA-C 06/10/21, reported daily orthostatic symptoms, encouraged compression wear, discussed midodrine titration for symptoms, mestinon continued, (discussed prior failure of florinef).  Discussed failure to thrive, pending P T  TODAY She is accompanied by her caregiver Bianca [name] and her daughter Bianca [name] is on the phone. She states Dr. Josefa Half and he stopped her losartan and started the Toprol.   Generally has good periods from her orthostatic symptoms and periods of increased symptoms. They all sound very familiar  with her strategies and care. Heer usual times of incerased symptoms and triggers. Good symptom recognition Good oral hydration and salt replacement She wears thigh sleeves.  She has had a good 3 weeks, ambulates typically with a walker Just got a wheelchair for longer out of the house stuff. She is getting some  therapy it seems intermittently, seems the staff is a bit unpredictable She has a recumbent bike that she uses regularly  No CP, palpitations Some mild DOE since her lung surgery    Past Medical History:  Diagnosis Date   Anxiety    Arthritis    Asthma    COPD (chronic obstructive pulmonary disease) ([place])    HOH (hard of hearing)    Hypertension    Wears dentures    top and partial bottom   Wears glasses     Past Surgical History:  Procedure Laterality Date   APPENDECTOMY     BREAST LUMPECTOMY WITH NEEDLE LOCALIZATION Right 04/21/2013   Procedure: BREAST LUMPECTOMY WITH NEEDLE LOCALIZATION;  Surgeon: Harl Bowie, MD;  Location: [place];  Service: General;  Laterality: Right;   BREAST SURGERY  1985   cyst rt br   MASTECTOMY W/ SENTINEL NODE BIOPSY Left 04/21/2013   Procedure: MASTECTOMY WITH SENTINEL LYMPH NODE BIOPSY;  Surgeon: Harl Bowie, MD;  Location: [place];  Service: General;  Laterality: Left;   OTHER SURGICAL HISTORY     cysts removed from foot   OTHER SURGICAL HISTORY     cysts removed from breast   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   Procedure: TOTAL HIP ARTHROPLASTY ANTERIOR APPROACH;  Surgeon: Mcarthur Rossetti, MD;  Location: [place];  Service: Orthopedics;  Laterality: Right;   TUBAL LIGATION      Current Outpatient Medications  Medication Sig Dispense Refill   acetaminophen (TYLENOL) 500 MG tablet Take 1,000 mg by mouth every 6 (six) hours as needed (for headaches).     apixaban (ELIQUIS) 2.5 MG TABS tablet Take 1 tablet (2.5 mg total) by mouth 2 (two) times daily. 60 tablet 1   atorvastatin (LIPITOR) 20 MG tablet TAKE 1 TABLET BY MOUTH DAILY. 90 tablet 3   busPIRone (BUSPAR) 7.5 MG tablet Take 7.5 mg by mouth as needed.     Calcium Carb-Cholecalciferol (CALCIUM+D3 PO) Take 1 tablet by mouth daily.     Cholecalciferol (VITAMIN D3) 50 MCG (2000 UT) TABS Take 2,000 Units by mouth daily.     ferrous  gluconate (FERGON) 324 MG tablet Take 1 tablet (324 mg total) by mouth 2 (two) times daily with a meal.  3   fluticasone-salmeterol (ADVAIR) 100-50 MCG/ACT AEPB Inhale 1 puff into the lungs 2 (two) times daily. 1 each 3   lactose free nutrition (BOOST) LIQD Take 237 mLs by mouth 2 (two) times daily between meals.     metoprolol succinate (TOPROL-XL) 25 MG 24 hr tablet Take 1 tablet by mouth daily.     midodrine (PROAMATINE) 5 MG tablet Take 1 tablet (5 mg total) by mouth 3 (three) times daily with meals. May take an extra 1/2 tablet daily as needed. 105 tablet 3   nitrofurantoin, macrocrystal-monohydrate, (MACROBID) 100 MG capsule Take 100 mg by mouth at bedtime.     PROAIR HFA 108 (90 Base) MCG/ACT inhaler INHALE 1-2 PUFFS INTO THE LUNGS EVERY 4 HOURS AS NEEDED 8.5 g 3   pyridostigmine (MESTINON) 60 MG tablet Take 0.5 tablets (30 mg total) by mouth in the morning and at  bedtime. 90 tablet 3   trimethoprim (TRIMPEX) 100 MG tablet Take 100 mg by mouth daily.     vitamin C (ASCORBIC ACID) 500 MG tablet Take 500 mg by mouth daily.      No current facility-administered medications for this visit.    Allergies:   Alprazolam, Buspar [buspirone], Mirtazapine, and Other   Social History:  The patient  reports that she has quit smoking. Her smoking use included cigarettes. She smoked an average of .25 packs per day. She has never used smokeless tobacco. She reports that she does not drink alcohol and does not use drugs.   Family History:  The patient's family history includes CAD in her mother; Thyroid disease in her sister.  ROS:  Please see the history of present illness.  All other systems are reviewed and otherwise negative.   PHYSICAL EXAM:  VS:  BP (!) 196/82   Pulse 73   Ht 5\' 6"  (1.676 m)   Wt 119 lb (54 kg)   LMP  (LMP Unknown)   SpO2 94%   BMI 19.21 kg/m BMI: Body mass index is 19.21 kg/m. Very thin elderly female, in no acute distress, somewhat chronically ill appearing HEENT:  normocephalic, atraumatic  Neck: no JVD, carotid bruits or masses Cardiac: RRR; no significant murmurs, no rubs, or gallops Lungs: CTA b/l, no wheezing, rhonchi or rales  Abd: soft, nontender MS: no deformity, advanced atrophy even for her age Ext: trace pedal edema  Skin: warm and dry, no rash Neuro:  No gross deficits appreciated Psych: euthymic mood, full affect   EKG: not done today  07/16/2020: TTE 1. Left ventricular ejection fraction, by estimation, is 60 to 65%. The  left ventricle has normal function. The left ventricle has no regional  wall motion abnormalities. Indeterminate diastolic filling due to E-A  fusion.   2. Right ventricular systolic function is normal. The right ventricular  size is normal.   3. The mitral valve is normal in structure. No evidence of mitral valve  regurgitation. No evidence of mitral stenosis.   4. The aortic valve is normal in structure. Aortic valve regurgitation is  not visualized. Mild aortic valve sclerosis is present, with no evidence  of aortic valve stenosis.   5. The inferior vena cava is normal in size with greater than 50%  respiratory variability, suggesting right atrial pressure of 3 mmHg.   Echocardiogram 01/22/2019: Impressions: 1. The left ventricle has normal systolic function with an ejection  fraction of 60-65%. The cavity size was normal. There is moderate  concentric left ventricular hypertrophy. Left ventricular diastolic  Doppler parameters are consistent with  pseudonormalization. Elevated left atrial and left ventricular  end-diastolic pressures.   2. The right ventricle has normal systolic function. The cavity was  normal. There is no increase in right ventricular wall thickness. Right  ventricular systolic pressure is moderately elevated.   3. Mitral valve regurgitation is moderate by color flow Doppler.   4. The aortic valve is tricuspid. Moderate thickening of the aortic  valve. Moderate calcification of the  aortic valve. Aortic valve  regurgitation was not assessed by color flow Doppler.   5. The inferior vena cava was dilated in size with <50% respiratory  variability.   Recent Labs: No results found for requested labs within last 8760 hours.  No results found for requested labs within last 8760 hours.   CrCl cannot be calculated (Patient's most recent lab result is older than the maximum 21 days allowed.).  Wt Readings from Last 3 Encounters:  12/17/21 119 lb (54 kg)  11/10/21 114 lb (51.7 kg)  06/10/21 114 lb 9.6 oz (52 kg)     Other studies reviewed: Additional studies/records reviewed today include: summarized above  ASSESSMENT AND PLAN:  1. Orthostatic hypotension     Discussed safety, symptom recognition, adequate hydration and abdominal binder when OOB     They are really doing a good job, discussed an additional 1/2 tab (7.5mg ) when symptoms are flared up      She is on Toprol (losartan previously) discussed this, a recheck on her BP here by myself 146/78  Her daughter inquires about a specialist,  2. HTN     Mixed feelings on her Toprol, for now, no changes since she seems to be doing pretty well  3. Paroxysmal Afib     CHA2DS2Vasc is 4, on Eliquis, appropriately dosed for age and weight     No symptoms of AFib     Disposition: we can see her back in 72mo, I will inquire with Dr. Lovena Le about referring to Dr. Caryl Comes for his input/insight.   Current medicines are reviewed at length with the patient today.  The patient did not have any concerns regarding medicines.  Venetia Night, PA-C 12/17/2021 4:36 PM     [address] [place] [address] [phone] (office)  [phone] (fax)

## 2021-12-17 ENCOUNTER — Ambulatory Visit: Payer: PPO | Admitting: Physician Assistant

## 2021-12-17 ENCOUNTER — Encounter: Payer: Self-pay | Admitting: Physician Assistant

## 2021-12-17 VITALS — BP 196/82 | HR 73 | Ht 66.0 in | Wt 119.0 lb

## 2021-12-17 DIAGNOSIS — I951 Orthostatic hypotension: Secondary | ICD-10-CM

## 2021-12-17 DIAGNOSIS — I480 Paroxysmal atrial fibrillation: Secondary | ICD-10-CM | POA: Diagnosis not present

## 2021-12-17 DIAGNOSIS — I10 Essential (primary) hypertension: Secondary | ICD-10-CM | POA: Diagnosis not present

## 2021-12-17 NOTE — Patient Instructions (Signed)
Medication Instructions:   Your physician recommends that you continue on your current medications as directed. Please refer to the Current Medication list given to you today.   *If you need a refill on your cardiac medications before your next appointment, please call your pharmacy*   Lab Work: [place]    If you have labs (blood work) drawn today and your tests are completely normal, you will receive your results only by: [place] (if you have MyChart) OR A paper copy in the mail If you have any lab test that is abnormal or we need to change your treatment, we will call you to review the results.   Testing/Procedures: NONE ORDERED  TODAY    Follow-Up: At [facility], you and your health needs are our priority.  As part of our continuing mission to provide you with exceptional heart care, we have created designated Provider Care Teams.  These Care Teams include your primary Cardiologist (physician) and Advanced Practice Providers (APPs -  Physician Assistants and Nurse Practitioners) who all work together to provide you with the care you need, when you need it.  We recommend signing up for the patient portal called "MyChart".  Sign up information is provided on this After Visit Summary.  MyChart is used to connect with patients for Virtual Visits (Telemedicine).  Patients are able to view lab/test results, encounter notes, upcoming appointments, etc.  Non-urgent messages can be sent to your provider as well.   To learn more about what you can do with MyChart, go to [email or web address].    Your next appointment:   6 month(s)  The format for your next appointment:   In Person  Provider:   Cristopher Peru, MD   Other Instructions   Important Information About Sugar

## 2021-12-18 ENCOUNTER — Encounter: Payer: Self-pay | Admitting: Internal Medicine

## 2021-12-18 DIAGNOSIS — Z9181 History of falling: Secondary | ICD-10-CM | POA: Diagnosis not present

## 2021-12-18 DIAGNOSIS — J439 Emphysema, unspecified: Secondary | ICD-10-CM | POA: Diagnosis not present

## 2021-12-18 DIAGNOSIS — I480 Paroxysmal atrial fibrillation: Secondary | ICD-10-CM

## 2021-12-18 DIAGNOSIS — I951 Orthostatic hypotension: Secondary | ICD-10-CM | POA: Diagnosis not present

## 2021-12-18 DIAGNOSIS — H9193 Unspecified hearing loss, bilateral: Secondary | ICD-10-CM | POA: Diagnosis not present

## 2021-12-18 DIAGNOSIS — N1831 Chronic kidney disease, stage 3a: Secondary | ICD-10-CM | POA: Diagnosis not present

## 2021-12-18 DIAGNOSIS — F32A Depression, unspecified: Secondary | ICD-10-CM | POA: Diagnosis not present

## 2021-12-18 DIAGNOSIS — I2510 Atherosclerotic heart disease of native coronary artery without angina pectoris: Secondary | ICD-10-CM | POA: Diagnosis not present

## 2021-12-18 DIAGNOSIS — Z87891 Personal history of nicotine dependence: Secondary | ICD-10-CM | POA: Diagnosis not present

## 2021-12-18 DIAGNOSIS — Z7901 Long term (current) use of anticoagulants: Secondary | ICD-10-CM | POA: Diagnosis not present

## 2021-12-18 DIAGNOSIS — M1990 Unspecified osteoarthritis, unspecified site: Secondary | ICD-10-CM | POA: Diagnosis not present

## 2021-12-18 DIAGNOSIS — E785 Hyperlipidemia, unspecified: Secondary | ICD-10-CM | POA: Diagnosis not present

## 2021-12-18 DIAGNOSIS — Z9012 Acquired absence of left breast and nipple: Secondary | ICD-10-CM | POA: Diagnosis not present

## 2021-12-18 DIAGNOSIS — J45909 Unspecified asthma, uncomplicated: Secondary | ICD-10-CM | POA: Diagnosis not present

## 2021-12-18 DIAGNOSIS — Z96641 Presence of right artificial hip joint: Secondary | ICD-10-CM | POA: Diagnosis not present

## 2021-12-18 DIAGNOSIS — M810 Age-related osteoporosis without current pathological fracture: Secondary | ICD-10-CM | POA: Diagnosis not present

## 2021-12-18 DIAGNOSIS — Z902 Acquired absence of lung [part of]: Secondary | ICD-10-CM | POA: Diagnosis not present

## 2021-12-18 DIAGNOSIS — Z7951 Long term (current) use of inhaled steroids: Secondary | ICD-10-CM | POA: Diagnosis not present

## 2021-12-18 DIAGNOSIS — G8191 Hemiplegia, unspecified affecting right dominant side: Secondary | ICD-10-CM | POA: Diagnosis not present

## 2021-12-18 DIAGNOSIS — I129 Hypertensive chronic kidney disease with stage 1 through stage 4 chronic kidney disease, or unspecified chronic kidney disease: Secondary | ICD-10-CM | POA: Diagnosis not present

## 2021-12-18 DIAGNOSIS — Z87440 Personal history of urinary (tract) infections: Secondary | ICD-10-CM | POA: Diagnosis not present

## 2021-12-18 DIAGNOSIS — E43 Unspecified severe protein-calorie malnutrition: Secondary | ICD-10-CM | POA: Diagnosis not present

## 2021-12-18 DIAGNOSIS — F419 Anxiety disorder, unspecified: Secondary | ICD-10-CM | POA: Diagnosis not present

## 2022-01-02 ENCOUNTER — Other Ambulatory Visit: Payer: Self-pay | Admitting: Student

## 2022-01-06 DIAGNOSIS — F330 Major depressive disorder, recurrent, mild: Secondary | ICD-10-CM | POA: Diagnosis not present

## 2022-01-06 DIAGNOSIS — D6869 Other thrombophilia: Secondary | ICD-10-CM | POA: Diagnosis not present

## 2022-01-06 DIAGNOSIS — J449 Chronic obstructive pulmonary disease, unspecified: Secondary | ICD-10-CM | POA: Diagnosis not present

## 2022-01-06 DIAGNOSIS — I480 Paroxysmal atrial fibrillation: Secondary | ICD-10-CM | POA: Diagnosis not present

## 2022-01-06 DIAGNOSIS — I951 Orthostatic hypotension: Secondary | ICD-10-CM | POA: Diagnosis not present

## 2022-01-06 DIAGNOSIS — N1832 Chronic kidney disease, stage 3b: Secondary | ICD-10-CM | POA: Diagnosis not present

## 2022-01-06 DIAGNOSIS — Z7951 Long term (current) use of inhaled steroids: Secondary | ICD-10-CM | POA: Diagnosis not present

## 2022-01-06 DIAGNOSIS — Z515 Encounter for palliative care: Secondary | ICD-10-CM | POA: Diagnosis not present

## 2022-01-06 DIAGNOSIS — D692 Other nonthrombocytopenic purpura: Secondary | ICD-10-CM | POA: Diagnosis not present

## 2022-01-06 DIAGNOSIS — I129 Hypertensive chronic kidney disease with stage 1 through stage 4 chronic kidney disease, or unspecified chronic kidney disease: Secondary | ICD-10-CM | POA: Diagnosis not present

## 2022-01-22 DIAGNOSIS — Z87891 Personal history of nicotine dependence: Secondary | ICD-10-CM | POA: Diagnosis not present

## 2022-01-22 DIAGNOSIS — Z902 Acquired absence of lung [part of]: Secondary | ICD-10-CM | POA: Diagnosis not present

## 2022-01-22 DIAGNOSIS — E43 Unspecified severe protein-calorie malnutrition: Secondary | ICD-10-CM | POA: Diagnosis not present

## 2022-01-22 DIAGNOSIS — I2510 Atherosclerotic heart disease of native coronary artery without angina pectoris: Secondary | ICD-10-CM | POA: Diagnosis not present

## 2022-01-22 DIAGNOSIS — Z7901 Long term (current) use of anticoagulants: Secondary | ICD-10-CM | POA: Diagnosis not present

## 2022-01-22 DIAGNOSIS — G8191 Hemiplegia, unspecified affecting right dominant side: Secondary | ICD-10-CM | POA: Diagnosis not present

## 2022-01-22 DIAGNOSIS — M1990 Unspecified osteoarthritis, unspecified site: Secondary | ICD-10-CM | POA: Diagnosis not present

## 2022-01-22 DIAGNOSIS — F32A Depression, unspecified: Secondary | ICD-10-CM | POA: Diagnosis not present

## 2022-01-22 DIAGNOSIS — Z7951 Long term (current) use of inhaled steroids: Secondary | ICD-10-CM | POA: Diagnosis not present

## 2022-01-22 DIAGNOSIS — Z87440 Personal history of urinary (tract) infections: Secondary | ICD-10-CM | POA: Diagnosis not present

## 2022-01-22 DIAGNOSIS — I951 Orthostatic hypotension: Secondary | ICD-10-CM | POA: Diagnosis not present

## 2022-01-22 DIAGNOSIS — F419 Anxiety disorder, unspecified: Secondary | ICD-10-CM | POA: Diagnosis not present

## 2022-01-22 DIAGNOSIS — J45909 Unspecified asthma, uncomplicated: Secondary | ICD-10-CM | POA: Diagnosis not present

## 2022-01-22 DIAGNOSIS — Z9012 Acquired absence of left breast and nipple: Secondary | ICD-10-CM | POA: Diagnosis not present

## 2022-01-22 DIAGNOSIS — N1831 Chronic kidney disease, stage 3a: Secondary | ICD-10-CM | POA: Diagnosis not present

## 2022-01-22 DIAGNOSIS — I129 Hypertensive chronic kidney disease with stage 1 through stage 4 chronic kidney disease, or unspecified chronic kidney disease: Secondary | ICD-10-CM | POA: Diagnosis not present

## 2022-01-22 DIAGNOSIS — Z9181 History of falling: Secondary | ICD-10-CM | POA: Diagnosis not present

## 2022-01-22 DIAGNOSIS — J439 Emphysema, unspecified: Secondary | ICD-10-CM | POA: Diagnosis not present

## 2022-01-22 DIAGNOSIS — H9193 Unspecified hearing loss, bilateral: Secondary | ICD-10-CM | POA: Diagnosis not present

## 2022-01-22 DIAGNOSIS — Z96641 Presence of right artificial hip joint: Secondary | ICD-10-CM | POA: Diagnosis not present

## 2022-01-22 DIAGNOSIS — M810 Age-related osteoporosis without current pathological fracture: Secondary | ICD-10-CM | POA: Diagnosis not present

## 2022-01-22 DIAGNOSIS — E785 Hyperlipidemia, unspecified: Secondary | ICD-10-CM | POA: Diagnosis not present

## 2022-01-22 DIAGNOSIS — I480 Paroxysmal atrial fibrillation: Secondary | ICD-10-CM | POA: Diagnosis not present

## 2022-02-19 DIAGNOSIS — M810 Age-related osteoporosis without current pathological fracture: Secondary | ICD-10-CM | POA: Diagnosis not present

## 2022-02-19 DIAGNOSIS — Z9012 Acquired absence of left breast and nipple: Secondary | ICD-10-CM | POA: Diagnosis not present

## 2022-02-19 DIAGNOSIS — I480 Paroxysmal atrial fibrillation: Secondary | ICD-10-CM | POA: Diagnosis not present

## 2022-02-19 DIAGNOSIS — I2510 Atherosclerotic heart disease of native coronary artery without angina pectoris: Secondary | ICD-10-CM | POA: Diagnosis not present

## 2022-02-19 DIAGNOSIS — E43 Unspecified severe protein-calorie malnutrition: Secondary | ICD-10-CM | POA: Diagnosis not present

## 2022-02-19 DIAGNOSIS — H9193 Unspecified hearing loss, bilateral: Secondary | ICD-10-CM | POA: Diagnosis not present

## 2022-02-19 DIAGNOSIS — G8191 Hemiplegia, unspecified affecting right dominant side: Secondary | ICD-10-CM | POA: Diagnosis not present

## 2022-02-19 DIAGNOSIS — E785 Hyperlipidemia, unspecified: Secondary | ICD-10-CM | POA: Diagnosis not present

## 2022-02-19 DIAGNOSIS — Z9181 History of falling: Secondary | ICD-10-CM | POA: Diagnosis not present

## 2022-02-19 DIAGNOSIS — Z96641 Presence of right artificial hip joint: Secondary | ICD-10-CM | POA: Diagnosis not present

## 2022-02-19 DIAGNOSIS — Z87440 Personal history of urinary (tract) infections: Secondary | ICD-10-CM | POA: Diagnosis not present

## 2022-02-19 DIAGNOSIS — Z902 Acquired absence of lung [part of]: Secondary | ICD-10-CM | POA: Diagnosis not present

## 2022-02-19 DIAGNOSIS — F32A Depression, unspecified: Secondary | ICD-10-CM | POA: Diagnosis not present

## 2022-02-19 DIAGNOSIS — Z7951 Long term (current) use of inhaled steroids: Secondary | ICD-10-CM | POA: Diagnosis not present

## 2022-02-19 DIAGNOSIS — F419 Anxiety disorder, unspecified: Secondary | ICD-10-CM | POA: Diagnosis not present

## 2022-02-19 DIAGNOSIS — I129 Hypertensive chronic kidney disease with stage 1 through stage 4 chronic kidney disease, or unspecified chronic kidney disease: Secondary | ICD-10-CM | POA: Diagnosis not present

## 2022-02-19 DIAGNOSIS — J439 Emphysema, unspecified: Secondary | ICD-10-CM | POA: Diagnosis not present

## 2022-02-19 DIAGNOSIS — J45909 Unspecified asthma, uncomplicated: Secondary | ICD-10-CM | POA: Diagnosis not present

## 2022-02-19 DIAGNOSIS — N1831 Chronic kidney disease, stage 3a: Secondary | ICD-10-CM | POA: Diagnosis not present

## 2022-02-19 DIAGNOSIS — Z87891 Personal history of nicotine dependence: Secondary | ICD-10-CM | POA: Diagnosis not present

## 2022-02-19 DIAGNOSIS — I951 Orthostatic hypotension: Secondary | ICD-10-CM | POA: Diagnosis not present

## 2022-02-19 DIAGNOSIS — M1990 Unspecified osteoarthritis, unspecified site: Secondary | ICD-10-CM | POA: Diagnosis not present

## 2022-02-19 DIAGNOSIS — Z7901 Long term (current) use of anticoagulants: Secondary | ICD-10-CM | POA: Diagnosis not present

## 2022-02-20 ENCOUNTER — Telehealth: Payer: Self-pay | Admitting: *Deleted

## 2022-02-20 DIAGNOSIS — H8110 Benign paroxysmal vertigo, unspecified ear: Secondary | ICD-10-CM

## 2022-02-20 NOTE — Telephone Encounter (Signed)
Please advise. No recent office visit. Lmtcb regarding referral for patient.

## 2022-02-20 NOTE — Telephone Encounter (Signed)
Copied from [place] [id number]. Topic: General - Other >> Feb 19, 2022  4:59 PM Ja-Kwan M wrote: Reason for CRM: Tillie Rung with [facility] reports that pt will be discharged from PT due to insurance will no longer cover. Tillie Rung stated she would like to request a referral to ENT as pt equilibrium seems to be off. Cb# [phone]

## 2022-02-23 NOTE — Telephone Encounter (Signed)
Spoke to patients daughter [name] regarding referral. She requested to refer to ENT first.

## 2022-03-03 DIAGNOSIS — H2513 Age-related nuclear cataract, bilateral: Secondary | ICD-10-CM | POA: Diagnosis not present

## 2022-03-03 DIAGNOSIS — H2511 Age-related nuclear cataract, right eye: Secondary | ICD-10-CM | POA: Diagnosis not present

## 2022-03-03 DIAGNOSIS — H25013 Cortical age-related cataract, bilateral: Secondary | ICD-10-CM | POA: Diagnosis not present

## 2022-03-03 DIAGNOSIS — H18413 Arcus senilis, bilateral: Secondary | ICD-10-CM | POA: Diagnosis not present

## 2022-03-03 DIAGNOSIS — H25043 Posterior subcapsular polar age-related cataract, bilateral: Secondary | ICD-10-CM | POA: Diagnosis not present

## 2022-03-11 DIAGNOSIS — H2511 Age-related nuclear cataract, right eye: Secondary | ICD-10-CM | POA: Diagnosis not present

## 2022-03-12 DIAGNOSIS — H25012 Cortical age-related cataract, left eye: Secondary | ICD-10-CM | POA: Diagnosis not present

## 2022-03-12 DIAGNOSIS — H25042 Posterior subcapsular polar age-related cataract, left eye: Secondary | ICD-10-CM | POA: Diagnosis not present

## 2022-03-12 DIAGNOSIS — H2512 Age-related nuclear cataract, left eye: Secondary | ICD-10-CM | POA: Diagnosis not present

## 2022-03-30 ENCOUNTER — Encounter: Payer: Self-pay | Admitting: Internal Medicine

## 2022-04-02 DIAGNOSIS — D692 Other nonthrombocytopenic purpura: Secondary | ICD-10-CM | POA: Diagnosis not present

## 2022-04-02 DIAGNOSIS — N1832 Chronic kidney disease, stage 3b: Secondary | ICD-10-CM | POA: Diagnosis not present

## 2022-04-02 DIAGNOSIS — Z515 Encounter for palliative care: Secondary | ICD-10-CM | POA: Diagnosis not present

## 2022-04-02 DIAGNOSIS — I129 Hypertensive chronic kidney disease with stage 1 through stage 4 chronic kidney disease, or unspecified chronic kidney disease: Secondary | ICD-10-CM | POA: Diagnosis not present

## 2022-04-02 DIAGNOSIS — I480 Paroxysmal atrial fibrillation: Secondary | ICD-10-CM | POA: Diagnosis not present

## 2022-04-02 DIAGNOSIS — F330 Major depressive disorder, recurrent, mild: Secondary | ICD-10-CM | POA: Diagnosis not present

## 2022-04-06 ENCOUNTER — Other Ambulatory Visit: Payer: Self-pay | Admitting: Internal Medicine

## 2022-04-14 MED ORDER — MIDODRINE HCL 5 MG PO TABS: 10.0000 mg | ORAL_TABLET | Freq: Two times a day (BID) | ORAL | 5 refills | Status: DC | PRN

## 2022-04-27 ENCOUNTER — Other Ambulatory Visit: Payer: Self-pay | Admitting: Internal Medicine

## 2022-04-28 ENCOUNTER — Other Ambulatory Visit: Payer: Self-pay

## 2022-04-28 MED ORDER — MIDODRINE HCL 5 MG PO TABS: 10.0000 mg | ORAL_TABLET | Freq: Two times a day (BID) | ORAL | 7 refills | Status: DC | PRN

## 2022-04-28 NOTE — Telephone Encounter (Signed)
Pt's medication was sent to pt's pharmacy as requested. Confirmation received.  °

## 2022-04-29 ENCOUNTER — Telehealth: Payer: Self-pay | Admitting: *Deleted

## 2022-04-29 NOTE — Patient Outreach (Signed)
  Care Coordination   Initial Visit Note   04/29/2022 Name: Bianca [name] MRN: [id number] DOB: Apr 02, 1941  Bianca [name] is a 81 y.o. year old female who sees Gwyneth Sprout, FNP for primary care. I spoke with  [name] by phone today.  What matters to the patients health and wellness today?  No health concerns voiced. RN discussed [place], RN, SW, and Pharmacist. Patient declined services.     Goals Addressed             This Visit's Progress    Patient advised to follow up with AWV and vaccines          SDOH assessments and interventions completed:  Yes     Care Coordination Interventions Activated:  Yes  Care Coordination Interventions:  Yes, provided   Follow up plan: No further intervention required.   Encounter Outcome:  Pt. [place] [phone]

## 2022-04-30 ENCOUNTER — Other Ambulatory Visit: Payer: Self-pay | Admitting: Family Medicine

## 2022-05-01 ENCOUNTER — Telehealth: Payer: Self-pay | Admitting: Family Medicine

## 2022-05-01 NOTE — Telephone Encounter (Signed)
Patient checking on the status of her albuterol refill request. Patient spoke with her pharmacy and was advised to follow up with PCP office.   [place], [place] Phone:  [phone]  Fax:  [phone]

## 2022-05-01 NOTE — Telephone Encounter (Signed)
Patient called and advised albuterol inhaler is not on her current list. She says it was prescribed by Clabe Seal. I advised she's with cardiology at [facility]. She asks if Daneil Dan would refill it for her.   Albuterol 108 90 base 1-2 puffs every 6 hours prn wheezing/SOB  [facility] is where to send Rx to.

## 2022-05-04 ENCOUNTER — Other Ambulatory Visit: Payer: Self-pay | Admitting: Family Medicine

## 2022-05-04 ENCOUNTER — Telehealth: Payer: Self-pay | Admitting: Internal Medicine

## 2022-05-04 MED ORDER — ALBUTEROL SULFATE HFA 108 (90 BASE) MCG/ACT IN AERS: 2.0000 | INHALATION_SPRAY | Freq: Four times a day (QID) | RESPIRATORY_TRACT | 2 refills | Status: DC | PRN

## 2022-05-04 NOTE — Telephone Encounter (Signed)
Pt daughter Ms. Candace - called back per message received in [facility] triage.   On 12/17/2021 Pt saw Tommye Standard, PA-C, and Pt was taking Midodrine - one ( 5 mg ) tablet by mouth TID; And may take extra 1/2 tablet daily as needed.   Then on 04/14/2022; I received an order from Dr. Lovena Le to: Take Midodrine, take two tablets ( 10 mg total ) by mouth twice a day as needed.  Pt daughter made aware of the NEW med order. See my previous note.    Pt daughter [name] stated her Mother Ms. Stadel, had been taking Midodrine, ( 10 mg ) by mouth three times a day?  Candace states her BP has been lower in the morning, but runs in the 160's in the afternoon?  Candace also stated that the patient has been lagging, feeling fatigued, and feels unsteady on her feet at times?    Candace advised and educated on what Ms. Cassidy should be taking regarding the Midodrine 10 mg, BID.  Ms. [name] was told that I will run this by Dr. Lovena Le and make him aware of her symptoms.  Follow up still required.

## 2022-05-04 NOTE — Telephone Encounter (Signed)
Please advise 

## 2022-05-04 NOTE — Telephone Encounter (Signed)
Pt Daughter sent this Via Mychart to the scheduling pool:    Hello there!!! This is Bianca [name].  Sharol Given daughter. They have her midodrine all screwed up. I was told she could start taking 10mg  3 times a day by Dr Forde Dandy PA.  She started that and now her prescription is 2 5mg  in am and 2 5mg  in evening. She is being left with a gap in between.  She ha been taking 10mg  3 times a day. The prescription needs to be changed ASAP!!! Her blood pressure is not going very high anymore. Could you please take care 61f this . Thank you!! Before she runs out of meds!!  Bianca [name]

## 2022-05-11 DIAGNOSIS — H2512 Age-related nuclear cataract, left eye: Secondary | ICD-10-CM | POA: Diagnosis not present

## 2022-05-18 DIAGNOSIS — H52223 Regular astigmatism, bilateral: Secondary | ICD-10-CM | POA: Diagnosis not present

## 2022-05-18 DIAGNOSIS — H2512 Age-related nuclear cataract, left eye: Secondary | ICD-10-CM | POA: Diagnosis not present

## 2022-05-18 DIAGNOSIS — H5202 Hypermetropia, left eye: Secondary | ICD-10-CM | POA: Diagnosis not present

## 2022-05-18 DIAGNOSIS — Z9849 Cataract extraction status, unspecified eye: Secondary | ICD-10-CM | POA: Diagnosis not present

## 2022-05-18 DIAGNOSIS — Z961 Presence of intraocular lens: Secondary | ICD-10-CM | POA: Diagnosis not present

## 2022-05-22 ENCOUNTER — Ambulatory Visit: Payer: PPO | Attending: Physician Assistant | Admitting: Physician Assistant

## 2022-05-22 ENCOUNTER — Encounter: Payer: Self-pay | Admitting: Physician Assistant

## 2022-05-22 VITALS — Ht 66.0 in | Wt 122.8 lb

## 2022-05-22 DIAGNOSIS — I480 Paroxysmal atrial fibrillation: Secondary | ICD-10-CM

## 2022-05-22 DIAGNOSIS — Z79899 Other long term (current) drug therapy: Secondary | ICD-10-CM | POA: Diagnosis not present

## 2022-05-22 DIAGNOSIS — I10 Essential (primary) hypertension: Secondary | ICD-10-CM

## 2022-05-22 DIAGNOSIS — I951 Orthostatic hypotension: Secondary | ICD-10-CM

## 2022-05-22 MED ORDER — METOPROLOL SUCCINATE ER 25 MG PO TB24: 12.5000 mg | ORAL_TABLET | Freq: Every day | ORAL | 1 refills | Status: DC

## 2022-05-22 MED ORDER — MIDODRINE HCL 10 MG PO TABS: 10.0000 mg | ORAL_TABLET | Freq: Three times a day (TID) | ORAL | 1 refills | Status: DC

## 2022-05-22 NOTE — Patient Instructions (Addendum)
Medication Instructions:   START TAKING: TOPROL XL 12.5  MG ONCE A DAY   START TAKING: MIDODRINE 10 MG THREE TIMES A DAY  WHILE AWAKE    *If you need a refill on your cardiac medications before your next appointment, please call your pharmacy*   Lab Work: NONE ORDERED TODAY ( RETURN FOR LABS BMET AND CBC) CONFIRMED  CHECK OUT SCHEDULER      If you have labs (blood work) drawn today and your tests are completely normal, you will receive your results only by: [place] (if you have MyChart) OR A paper copy in the mail If you have any lab test that is abnormal or we need to change your treatment, we will call you to review the results.   Testing/Procedures:NONE ORDERED  TODAY   Follow-Up: At [facility], you and your health needs are our priority.  As part of our continuing mission to provide you with exceptional heart care, we have created designated Provider Care Teams.  These Care Teams include your primary Cardiologist (physician) and Advanced Practice Providers (APPs -  Physician Assistants and Nurse Practitioners) who all work together to provide you with the care you need, when you need it.  We recommend signing up for the patient portal called "MyChart".  Sign up information is provided on this After Visit Summary.  MyChart is used to connect with patients for Virtual Visits (Telemedicine).  Patients are able to view lab/test results, encounter notes, upcoming appointments, etc.  Non-urgent messages can be sent to your provider as well.   To learn more about what you can do with MyChart, go to [email or web address].    Your next appointment:    3 month(s)  The format for your next appointment:   In Person  Provider:   You may see Cristopher Peru, MD    Other Instructions   Important Information About Sugar

## 2022-05-22 NOTE — Progress Notes (Addendum)
Cardiology Office Note Date:  05/22/2022  Patient ID:  Bianca [name], Bianca [name] 12/06/1940, MRN [id number] PCP:  Bianca Sprout, FNP  Cardiologist:  Dr. Lovena [name]    Chief Complaint:  planned f/u  History of Present Illness: Bianca [name] is a 81 y.o. female with history of orthostatic hypotension/autonomic dysfunction, HTN, AFib, COPD, asthma, anxiety, smoker.  She sawDr. Lovena [name], 09/2019, she was doing well, maintaining SR on flecainide, advised to check BP prior to taking midodrine with elevated BP  In the interim has been found with a lung mass  She was admitted to [facility] 02/11/2020 after a fall (uncertain mechanism, pt did not think she fainted)and hip fracture.  She had some post anesthesia delirium, though otherwise unremarkable post op course.  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 Despite compression strategies remained markedly orthostatic, mestinon was added to her midodrine which had to be reduced 2/2 supine HTN. AM cortisol level was normal, carotid US without obstructive disease. Orthostatic BPs improved She was discharged 02/23/2020  I saw her Sept 2021 She is accompanied by her daughter. She reports her fall as possibly syncope, she had been seated on the porch and had gotten up to go inside, her daughter ahead of her to keep the dogs inside, and she fell of the side of he deck, not terribly high only a foot or so off the ground.  She does not recall "waking up" necessarily, but assumes she fainted. She has done quite well since her discharge, is tolerating PT well, and her daughter mentions she has even stood for several minutes without getting dizzy. She does not have any cardiac awareness, no palpitations or CP. She does not wear the support stocking or abdominal binder, even though her daughter notes they helped her quite a bit. She has not  had near syncope or syncope since home on the current regime. She has not smoked since her discharge.  She is pending thoracoscopy and biopsy at [facility], scheduled for 04/11/20. They have instructed her to stop her Eliquis on the 18th. She denies any bleeding or signs of bleeding No changes were made   left lower lobectomy 04/11/2020 for lung cancer  She saw Dr. Lovena [name]  06/18/20 no syncope,  with some instructions on midodrine use/management  07/23/20: was post TIA, ER visit with high BP's mestinon was stopped there though at her visit with him, fatigued, BP low again and mestinon restarted  She has seen Bianca [name] a few times since then, most recently 04/29/21, on midodrine, mestinon AND as well as Toprol for HTN She had been in the ER with UTI associated hallucinations. Her BP better, orthostatic hypotension felt multifactorial in a patient who is frail, malnourished, underlying COPD, status post recent left lower lobectomy She was pending a neuro evaluation with some question of Parkinson's Encouarged compression socks Planned o see her in 4 mo  Saw A. Tillery, PA-C 06/10/21, reported daily orthostatic symptoms, encouraged compression wear, discussed midodrine titration for symptoms, mestinon continued, (discussed prior failure of florinef).  Discussed failure to thrive, pending P T  I saw her May 2023 She is accompanied by her caregiver Bianca [name] and her daughter Bianca [name] is on the phone. She states Dr. Josefa Half and he stopped her losartan and started the Toprol.   Generally has good periods from her orthostatic symptoms and periods of increased symptoms. They  all sound very familiar with her strategies and care. Heer usual times of incerased symptoms and triggers. Good symptom recognition Good oral hydration and salt replacement She wears thigh sleeves. She has had a good 3 weeks, ambulates typically with a walker Just got a wheelchair for longer out of the house stuff. She is  getting some therapy it seems intermittently, seems the staff is a bit unpredictable She has a recumbent bike that she uses regularly No CP, palpitations Some mild DOE since her lung surgery  I felt they were doing quite well, continued Toprol given good BP (rechecked myself and was 146/78)  and stability of symptoms. He daughter inquired about seeing a specialist for orthostatic hypotension/autonomic dysfunction, planned to reach out to dr. Lovena [name], perhaps referral to Dr. Caryl Comes  Daughter has communicated intermittently, in Sept requesting an increase in her midodrine and was moved to 10mg  BID prn via Dr. Lovena [name]. And more recently 05/04/22 Reported 10mg  TID, reported pt feeling fatigued, unsteady Lower BPs in AM, higher in the afternoons.   TODAY She is accompanied by Bianca [name] her live-in caregiver. She continues to have spells, has noted when taking the midodrine TID she does better, not perfect but less often has to lay on the floor. Some days she can barely make it from the car to inside the house without having to lay down. TID dosing helps this. She has not had full syncope. She has not had symptoms on the commode on a long time, no near syncope or symptoms seated or supine. No CP She has some baseline SOB that is unchanged. Infrequent palpitations  No bleeding or signs of bleeding   Past Medical History:  Diagnosis Date   Anxiety    Arthritis    Asthma    COPD (chronic obstructive pulmonary disease) ([place])    HOH (hard of hearing)    Hypertension    Wears dentures    top and partial bottom   Wears glasses     Past Surgical History:  Procedure Laterality Date   APPENDECTOMY     BREAST LUMPECTOMY WITH NEEDLE LOCALIZATION Right 04/21/2013   Procedure: BREAST LUMPECTOMY WITH NEEDLE LOCALIZATION;  Surgeon: Harl Bowie, MD;  Location: [place];  Service: General;  Laterality: Right;   BREAST SURGERY  1985   cyst rt br   MASTECTOMY W/ SENTINEL NODE  BIOPSY Left 04/21/2013   Procedure: MASTECTOMY WITH SENTINEL LYMPH NODE BIOPSY;  Surgeon: Harl Bowie, MD;  Location: [place];  Service: General;  Laterality: Left;   OTHER SURGICAL HISTORY     cysts removed from foot   OTHER SURGICAL HISTORY     cysts removed from breast   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   Procedure: TOTAL HIP ARTHROPLASTY ANTERIOR APPROACH;  Surgeon: Mcarthur Rossetti, MD;  Location: [place];  Service: Orthopedics;  Laterality: Right;   TUBAL LIGATION      Current Outpatient Medications  Medication Sig Dispense Refill   acetaminophen (TYLENOL) 500 MG tablet Take 1,000 mg by mouth every 6 (six) hours as needed (for headaches).     albuterol (VENTOLIN HFA) 108 (90 Base) MCG/ACT inhaler Inhale 2 puffs into the lungs every 6 (six) hours as needed for wheezing or shortness of breath. 8 g 2   apixaban (ELIQUIS) 2.5 MG TABS tablet Take 1 tablet (2.5 mg total) by mouth 2 (two) times daily. 60 tablet 1   atorvastatin (LIPITOR) 20 MG tablet TAKE 1 TABLET BY MOUTH DAILY. [place]  tablet 3   busPIRone (BUSPAR) 7.5 MG tablet Take 7.5 mg by mouth as needed.     Calcium Carb-Cholecalciferol (CALCIUM+D3 PO) Take 1 tablet by mouth daily.     Cholecalciferol (VITAMIN D3) 50 MCG (2000 UT) TABS Take 2,000 Units by mouth daily.     ferrous gluconate (FERGON) 324 MG tablet Take 1 tablet (324 mg total) by mouth 2 (two) times daily with a meal.  3   fluticasone-salmeterol (ADVAIR) 100-50 MCG/ACT AEPB INHALE 1 PUFF INTO THE LUNGS TWICE DAILY 60 each 0   lactose free nutrition (BOOST) LIQD Take 237 mLs by mouth 2 (two) times daily between meals.     metoprolol succinate (TOPROL-XL) 25 MG 24 hr tablet TAKE 1 TABLET BY MOUTH ONCE DAILY. 30 tablet 6   midodrine (PROAMATINE) 5 MG tablet Take 2 tablets (10 mg total) by mouth 2 (two) times daily as needed. 120 tablet 7   nitrofurantoin, macrocrystal-monohydrate, (MACROBID) 100 MG capsule Take 100 mg by mouth at bedtime.      pyridostigmine (MESTINON) 60 MG tablet TAKE 0.5 TABLET BY MOUTH MORNING AND NIGHT. Please call to schedule an overdue appointment with Dr. Lovena [name] for refills, [phone], thank you. 1st attempt. 30 tablet 0   trimethoprim (TRIMPEX) 100 MG tablet Take 100 mg by mouth daily.     vitamin C (ASCORBIC ACID) 500 MG tablet Take 500 mg by mouth daily.      No current facility-administered medications for this visit.    Allergies:   Alprazolam, Buspar [buspirone], Mirtazapine, and Other   Social History:  The patient  reports that she has quit smoking. Her smoking use included cigarettes. She smoked an average of .25 packs per day. She has never used smokeless tobacco. She reports that she does not drink alcohol and does not use drugs.   Family History:  The patient's family history includes CAD in her mother; Thyroid disease in her sister.  ROS:  Please see the history of present illness.  All other systems are reviewed and otherwise negative.   PHYSICAL EXAM:  VS:  LMP  (LMP Unknown) BMI: There is no height or weight on file to calculate BMI. Very thin elderly female, in no acute distress, somewhat chronically ill appearing HEENT: normocephalic, atraumatic  Neck: no JVD, carotid bruits or masses Cardiac:  RRR; no significant murmurs, no rubs, or gallops Lungs: CTA b/l, no wheezing, rhonchi or rales  Abd: soft, nontender MS: no deformity, advanced atrophy even for her age Ext: no edema  Skin: warm and dry, no rash Neuro:  No gross deficits appreciated Psych: euthymic mood, full affect   EKG: done today and reviewed by myself SR 75bpm, appears similar to prior  07/16/2020: TTE 1. Left ventricular ejection fraction, by estimation, is 60 to 65%. The  left ventricle has normal function. The left ventricle has no regional  wall motion abnormalities. Indeterminate diastolic filling due to E-A  fusion.   2. Right ventricular systolic function is normal. The right ventricular  size is  normal.   3. The mitral valve is normal in structure. No evidence of mitral valve  regurgitation. No evidence of mitral stenosis.   4. The aortic valve is normal in structure. Aortic valve regurgitation is  not visualized. Mild aortic valve sclerosis is present, with no evidence  of aortic valve stenosis.   5. The inferior vena cava is normal in size with greater than 50%  respiratory variability, suggesting right atrial pressure of 3 mmHg.   Echocardiogram 01/22/2019:  Impressions: 1. The left ventricle has normal systolic function with an ejection  fraction of 60-65%. The cavity size was normal. There is moderate  concentric left ventricular hypertrophy. Left ventricular diastolic  Doppler parameters are consistent with  pseudonormalization. Elevated left atrial and left ventricular  end-diastolic pressures.   2. The right ventricle has normal systolic function. The cavity was  normal. There is no increase in right ventricular wall thickness. Right  ventricular systolic pressure is moderately elevated.   3. Mitral valve regurgitation is moderate by color flow Doppler.   4. The aortic valve is tricuspid. Moderate thickening of the aortic  valve. Moderate calcification of the aortic valve. Aortic valve  regurgitation was not assessed by color flow Doppler.   5. The inferior vena cava was dilated in size with <50% respiratory  variability.   Recent Labs: No results found for requested labs within last 365 days.  No results found for requested labs within last 365 days.   CrCl cannot be calculated (Patient's most recent lab result is older than the maximum 21 days allowed.).   Wt Readings from Last 3 Encounters:  12/17/21 119 lb (54 kg)  11/10/21 114 lb (51.7 kg)  06/10/21 114 lb 9.6 oz (52 kg)     Other studies reviewed: Additional studies/records reviewed today include: summarized above  ASSESSMENT AND PLAN:  1.   HTN 2.  Orthostatic hypotension     Re-discussed safety,  symptom recognition, adequate hydration and abdominal binder when OOB      Drops 21mmHg today with standing Her resting BP is high here, they report never is so high at home, typically her higher numbers at home 120's-130's, checking twice daily  Will reduce her Toprol to 12.5mg  daily and increase her midodrine to 10mg  TID. We discussed timing of her midodrine, they are doing well Discussed thigh sleeves, abdominal binder when not supine.  Discussed reduction of midodrine if SBP gets to the 160's-170 range   3. Paroxysmal Afib     CHA2DS2Vasc is 4, on Eliquis, appropriately dosed for age and weight     Infrequent and brief palpitations     Labs today     Disposition: Will have her back in 3 mo, she would like to see Dr. Lovena [name], sooner if needed    Current medicines are reviewed at length with the patient today.  The patient did not have any concerns regarding medicines.  Venetia Night, PA-C 05/22/2022 6:11 AM     [facility] [address] [place] [address] [phone] (office)  [phone] (fax)

## 2022-05-23 ENCOUNTER — Other Ambulatory Visit: Payer: Self-pay | Admitting: Family Medicine

## 2022-05-25 NOTE — Telephone Encounter (Signed)
Refilled 08/19/2021 #[id number] rf. Requested Prescriptions  Pending Prescriptions Disp Refills   atorvastatin (LIPITOR) 20 MG tablet [Pharmacy Med Name: ATORVASTATIN CALCIUM 20 MG TAB] 90 tablet 3    Sig: TAKE 1 TABLET BY MOUTH DAILY.     Cardiovascular:  Antilipid - Statins Failed - 05/23/2022 12:12 PM      Failed - Lipid Panel in normal range within the last 12 months    Cholesterol, Total  Date Value Ref Range Status  10/30/2019 150 100 - 199 mg/dL Final   Cholesterol  Date Value Ref Range Status  07/15/2020 167 0 - 200 mg/dL Final   LDL Chol Calc (NIH)  Date Value Ref Range Status  10/30/2019 86 0 - 99 mg/dL Final   LDL Cholesterol  Date Value Ref Range Status  07/15/2020 99 0 - 99 mg/dL Final    Comment:           Total Cholesterol/HDL:CHD Risk Coronary Heart Disease Risk Table                     Men   Women  1/2 Average Risk   3.4   3.3  Average Risk       5.0   4.4  2 X Average Risk   9.6   7.1  3 X Average Risk  23.4   11.0        Use the calculated Patient Ratio above and the CHD Risk Table to determine the patient's CHD Risk.        ATP III CLASSIFICATION (LDL):  <100     mg/dL   Optimal  100-129  mg/dL   Near or Above                    Optimal  130-159  mg/dL   Borderline  160-189  mg/dL   High  >190     mg/dL   Very High Performed at [facility] [address] [place], [place] [address]    Direct LDL  Date Value Ref Range Status  04/24/2010 94.8 mg/dL Final    Comment:    See lab report for associated comment(s)   HDL  Date Value Ref Range Status  07/15/2020 53 >40 mg/dL Final  10/30/2019 48 >39 mg/dL Final   Triglycerides  Date Value Ref Range Status  07/15/2020 75 <150 mg/dL Final         Passed - Patient is not pregnant      Passed - Valid encounter within last 12 months    Recent Outpatient Visits           11 months ago No-show for appointment   [facility] Gwyneth Sprout, [place]   1 year ago Age-related  physical debility   [facility] [place], Dionne Bucy, MD   1 year ago No-show for appointment   [facility] Gwyneth Sprout, FNP   1 year ago Erroneous encounter - disregard   [facility] Tally Joe T, FNP   1 year ago Urinary tract infection without hematuria, site unspecified   [facility] Jerrol Banana., MD       Future Appointments             In 2 months Evans Lance, MD [place]. [facility], [facility]

## 2022-05-28 ENCOUNTER — Other Ambulatory Visit: Payer: PPO

## 2022-05-28 ENCOUNTER — Ambulatory Visit: Payer: PPO | Admitting: Family Medicine

## 2022-05-28 NOTE — Progress Notes (Deleted)
      Established patient visit   Patient: Bianca [name]   DOB: 09-02-1940   81 y.o. Female  MRN: [id number] Visit Date: 05/28/2022  Today's healthcare provider: Gwyneth Sprout, FNP   No chief complaint on file.  Subjective    HPI    Medications: Outpatient Medications Prior to Visit  Medication Sig   acetaminophen (TYLENOL) 500 MG tablet Take 1,000 mg by mouth every 6 (six) hours as needed (for headaches).   albuterol (VENTOLIN HFA) 108 (90 Base) MCG/ACT inhaler Inhale 2 puffs into the lungs every 6 (six) hours as needed for wheezing or shortness of breath.   apixaban (ELIQUIS) 2.5 MG TABS tablet Take 1 tablet (2.5 mg total) by mouth 2 (two) times daily.   atorvastatin (LIPITOR) 20 MG tablet TAKE 1 TABLET BY MOUTH DAILY.   busPIRone (BUSPAR) 7.5 MG tablet Take 7.5 mg by mouth as needed.   Calcium Carb-Cholecalciferol (CALCIUM+D3 PO) Take 1 tablet by mouth daily.   Cholecalciferol (VITAMIN D3) 50 MCG (2000 UT) TABS Take 2,000 Units by mouth daily.   ferrous gluconate (FERGON) 324 MG tablet Take 1 tablet (324 mg total) by mouth 2 (two) times daily with a meal.   fluticasone-salmeterol (ADVAIR) 100-50 MCG/ACT AEPB INHALE 1 PUFF INTO THE LUNGS TWICE DAILY   lactose free nutrition (BOOST) LIQD Take 237 mLs by mouth 2 (two) times daily between meals.   metoprolol succinate (TOPROL-XL) 25 MG 24 hr tablet Take 0.5 tablets (12.5 mg total) by mouth daily.   midodrine (PROAMATINE) 10 MG tablet Take 1 tablet (10 mg total) by mouth 3 (three) times daily with meals.   nitrofurantoin, macrocrystal-monohydrate, (MACROBID) 100 MG capsule Take 100 mg by mouth at bedtime.   pyridostigmine (MESTINON) 60 MG tablet TAKE 0.5 TABLET BY MOUTH MORNING AND NIGHT. Please call to schedule an overdue appointment with Dr. Lovena Le for refills, [phone], thank you. 1st attempt.   trimethoprim (TRIMPEX) 100 MG tablet Take 100 mg by mouth daily.   vitamin C (ASCORBIC ACID) 500 MG tablet Take 500 mg by mouth  daily.    No facility-administered medications prior to visit.    Review of Systems  {Labs  Heme  Chem  Endocrine  Serology  Results Review (optional):23779}   Objective    LMP  (LMP Unknown)  {Show previous vital signs (optional):23777}  Physical Exam  ***  No results found for any visits on 05/28/22.  Assessment & Plan     ***  No follow-ups on file.      {provider attestation***:1}   Gwyneth Sprout, [place] [phone] (phone) [phone] (fax)  [place]

## 2022-05-30 ENCOUNTER — Other Ambulatory Visit: Payer: Self-pay | Admitting: Family Medicine

## 2022-06-01 ENCOUNTER — Ambulatory Visit: Payer: PPO | Admitting: Family Medicine

## 2022-06-01 MED ORDER — FLUTICASONE-SALMETEROL 100-50 MCG/ACT IN AEPB: INHALATION_SPRAY | RESPIRATORY_TRACT | 0 refills | Status: DC

## 2022-06-01 NOTE — Telephone Encounter (Signed)
Requested Prescriptions  Pending Prescriptions Disp Refills   fluticasone-salmeterol (ADVAIR) 100-50 MCG/ACT AEPB 60 each 0    Sig: INHALE 1 PUFF INTO THE LUNGS TWICE DAILY     Pulmonology:  Combination Products Passed - 06/01/2022  2:52 PM      Passed - Valid encounter within last 12 months    Recent Outpatient Visits           12 months ago No-show for appointment   [facility] Gwyneth Sprout, FNP   1 year ago Age-related physical debility   [facility] [place], Dionne Bucy, MD   1 year ago No-show for appointment   [facility] Gwyneth Sprout, FNP   1 year ago Erroneous encounter - disregard   [facility] Tally Joe T, FNP   1 year ago Urinary tract infection without hematuria, site unspecified   [facility] Jerrol Banana., MD       Future Appointments             In 2 months Evans Lance, MD [place]. [facility], [facility]            Signed Prescriptions Disp Refills   fluticasone-salmeterol (ADVAIR) 100-50 MCG/ACT AEPB 60 each 0    Sig: INHALE 1 PUFF INTO THE LUNGS TWICE DAILY     Pulmonology:  Combination Products Passed - 05/30/2022  1:55 PM      Passed - Valid encounter within last 12 months    Recent Outpatient Visits           12 months ago No-show for appointment   [facility] Gwyneth Sprout, FNP   1 year ago Age-related physical debility   [facility] [place], Dionne Bucy, MD   1 year ago No-show for appointment   [facility] Gwyneth Sprout, FNP   1 year ago Erroneous encounter - disregard   [facility] Tally Joe T, FNP   1 year ago Urinary tract infection without hematuria, site unspecified   [facility] Jerrol Banana., MD       Future Appointments             In 2 months Evans Lance, MD [place]. [facility], [facility]

## 2022-06-01 NOTE — Telephone Encounter (Signed)
Resent due to not received at the pharmacy, patient called and spoke to [facility].

## 2022-06-01 NOTE — Addendum Note (Signed)
Addended by: Matilde Sprang on: 06/01/2022 02:52 PM   Modules accepted: Orders

## 2022-06-03 ENCOUNTER — Ambulatory Visit: Payer: PPO | Attending: Physician Assistant

## 2022-06-03 DIAGNOSIS — Z79899 Other long term (current) drug therapy: Secondary | ICD-10-CM | POA: Diagnosis not present

## 2022-06-04 LAB — BASIC METABOLIC PANEL
BUN/Creatinine Ratio: 12 (ref 12–28)
BUN: 18 mg/dL (ref 8–27)
CO2: 23 mmol/L (ref 20–29)
Calcium: 9.1 mg/dL (ref 8.7–10.3)
Chloride: 106 mmol/L (ref 96–106)
Creatinine, Ser: 1.47 mg/dL — ABNORMAL HIGH (ref 0.57–1.00)
Glucose: 87 mg/dL (ref 70–99)
Potassium: 4.2 mmol/L (ref 3.5–5.2)
Sodium: 145 mmol/L — ABNORMAL HIGH (ref 134–144)
eGFR: 35 mL/min/{1.73_m2} — ABNORMAL LOW (ref >59)

## 2022-06-04 LAB — CBC
Hematocrit: 35.6 % (ref 34.0–46.6)
Hemoglobin: 12 g/dL (ref 11.1–15.9)
MCH: 30.7 pg (ref 26.6–33.0)
MCHC: 33.7 g/dL (ref 31.5–35.7)
MCV: 91 fL (ref 79–97)
Platelets: 274 10*3/uL (ref 150–450)
RBC: 3.91 x10E6/uL (ref 3.77–5.28)
RDW: 13.2 % (ref 11.7–15.4)
WBC: 6.3 10*3/uL (ref 3.4–10.8)

## 2022-06-26 DIAGNOSIS — I10 Essential (primary) hypertension: Secondary | ICD-10-CM | POA: Diagnosis not present

## 2022-06-26 DIAGNOSIS — Z681 Body mass index (BMI) 19 or less, adult: Secondary | ICD-10-CM | POA: Diagnosis not present

## 2022-06-26 DIAGNOSIS — Z515 Encounter for palliative care: Secondary | ICD-10-CM | POA: Diagnosis not present

## 2022-06-26 DIAGNOSIS — Z87891 Personal history of nicotine dependence: Secondary | ICD-10-CM | POA: Diagnosis not present

## 2022-07-03 ENCOUNTER — Encounter: Payer: Self-pay | Admitting: Family Medicine

## 2022-07-03 ENCOUNTER — Ambulatory Visit (INDEPENDENT_AMBULATORY_CARE_PROVIDER_SITE_OTHER): Payer: PPO | Admitting: Family Medicine

## 2022-07-03 ENCOUNTER — Ambulatory Visit
Admission: RE | Admit: 2022-07-03 | Discharge: 2022-07-03 | Disposition: A | Discharge status: Home / Self Care | Payer: PPO | Attending: Family Medicine | Admitting: Family Medicine

## 2022-07-03 ENCOUNTER — Ambulatory Visit
Admission: RE | Admit: 2022-07-03 | Discharge: 2022-07-03 | Disposition: A | Discharge status: Home / Self Care | Payer: PPO | Source: Ambulatory Visit | Attending: Family Medicine | Admitting: Family Medicine

## 2022-07-03 VITALS — BP 130/80 | HR 76 | Resp 15 | Wt 121.5 lb

## 2022-07-03 DIAGNOSIS — I10 Essential (primary) hypertension: Secondary | ICD-10-CM | POA: Diagnosis not present

## 2022-07-03 DIAGNOSIS — R0609 Other forms of dyspnea: Secondary | ICD-10-CM | POA: Diagnosis not present

## 2022-07-03 DIAGNOSIS — Z85118 Personal history of other malignant neoplasm of bronchus and lung: Secondary | ICD-10-CM | POA: Insufficient documentation

## 2022-07-03 DIAGNOSIS — R0600 Dyspnea, unspecified: Secondary | ICD-10-CM | POA: Diagnosis not present

## 2022-07-03 MED ORDER — ALBUTEROL SULFATE HFA 108 (90 BASE) MCG/ACT IN AERS: 2.0000 | INHALATION_SPRAY | Freq: Four times a day (QID) | RESPIRATORY_TRACT | 2 refills | Status: DC | PRN

## 2022-07-03 MED ORDER — FLUTICASONE-SALMETEROL 100-50 MCG/ACT IN AEPB: INHALATION_SPRAY | RESPIRATORY_TRACT | 0 refills | Status: DC

## 2022-07-03 NOTE — Assessment & Plan Note (Signed)
Acute on chronic, reports 10 times/week use of albuterol  Request for refill albuterol Request for refill of advair  Referral back to pulm given missed CT follow up 07/2021

## 2022-07-03 NOTE — Assessment & Plan Note (Signed)
Acute on chronic DOE with use of albuterol 10 times/week use of albuterol  Request for refill albuterol Request for refill of advair  Referral back to pulm given missed CT follow up 07/2021 Endorses dyspnea, cough, active wheezing and increased sputum Denies any URI or covid/flu exposure

## 2022-07-03 NOTE — Assessment & Plan Note (Signed)
Chronic, stable per home readings Pt took midodrine 10 mg prior to appt Elevated in office today

## 2022-07-03 NOTE — Progress Notes (Signed)
Fritzi Mandes Wolford,acting as a Education administrator for Gwyneth Sprout, FNP.,have documented all relevant documentation on the behalf of Gwyneth Sprout, FNP,as directed by  Gwyneth Sprout, FNP while in the presence of Gwyneth Sprout, FNP.   Established patient visit  Patient: Bianca [name]   DOB: [birth year]   81 y.o. Female  MRN: [id number] Visit Date: 07/03/2022  Today's healthcare provider: Gwyneth Sprout, FNP  Patient presents for new patient visit to establish care.  Introduced to Designer, jewellery role and practice setting.  All questions answered.  Discussed provider/patient relationship and expectations.  Chief Complaint  Patient presents with   COPD   Subjective    HPI  COPD, Follow up   She reports excellent compliance with treatment. She is not having side effects.  she uses rescue inhaler 10 per weeks. She IS experiencing dyspnea, cough, wheezing, and increased sputum. She is NOT experiencing fatigue, weight increase, weight loss, chills, or fever. she reports breathing is Unchanged. Patient   Pulmonary Functions Testing Results:  No results found for: "FEV1", "FVC", "FEV1FVC", "TLC"  -----------------------------------------------------------------------------------------   Medications: Outpatient Medications Prior to Visit  Medication Sig   acetaminophen (TYLENOL) 500 MG tablet Take 1,000 mg by mouth every 6 (six) hours as needed (for headaches).   apixaban (ELIQUIS) 2.5 MG TABS tablet Take 1 tablet (2.5 mg total) by mouth 2 (two) times daily.   atorvastatin (LIPITOR) 20 MG tablet TAKE 1 TABLET BY MOUTH DAILY.   busPIRone (BUSPAR) 7.5 MG tablet Take 7.5 mg by mouth as needed.   Calcium Carb-Cholecalciferol (CALCIUM+D3 PO) Take 1 tablet by mouth daily.   Cholecalciferol (VITAMIN D3) 50 MCG (2000 UT) TABS Take 2,000 Units by mouth daily.   ferrous gluconate (FERGON) 324 MG tablet Take 1 tablet (324 mg total) by mouth 2 (two) times daily with a meal.   lactose free nutrition  (BOOST) LIQD Take 237 mLs by mouth 2 (two) times daily between meals.   metoprolol succinate (TOPROL-XL) 25 MG 24 hr tablet Take 0.5 tablets (12.5 mg total) by mouth daily.   midodrine (PROAMATINE) 10 MG tablet Take 1 tablet (10 mg total) by mouth 3 (three) times daily with meals.   nitrofurantoin, macrocrystal-monohydrate, (MACROBID) 100 MG capsule Take 100 mg by mouth at bedtime.   pyridostigmine (MESTINON) 60 MG tablet TAKE 0.5 TABLET BY MOUTH MORNING AND NIGHT. Please call to schedule an overdue appointment with Dr. Lovena Le for refills, [phone], thank you. 1st attempt.   trimethoprim (TRIMPEX) 100 MG tablet Take 100 mg by mouth daily.   vitamin C (ASCORBIC ACID) 500 MG tablet Take 500 mg by mouth daily.    [DISCONTINUED] albuterol (VENTOLIN HFA) 108 (90 Base) MCG/ACT inhaler Inhale 2 puffs into the lungs every 6 (six) hours as needed for wheezing or shortness of breath.   [DISCONTINUED] fluticasone-salmeterol (ADVAIR) 100-50 MCG/ACT AEPB INHALE 1 PUFF INTO THE LUNGS TWICE DAILY   No facility-administered medications prior to visit.    Review of Systems    Objective    BP 130/80   Pulse 76   Resp 15   Wt 121 lb 8 oz (55.1 kg)   LMP  (LMP Unknown)   SpO2 98%   BMI 19.61 kg/m   Physical Exam Vitals and nursing note reviewed.  Constitutional:      General: She is not in acute distress.    Appearance: Normal appearance. She is normal weight. She is not ill-appearing, toxic-appearing or diaphoretic.  HENT:  Head: Normocephalic and atraumatic.  Cardiovascular:     Rate and Rhythm: Normal rate and regular rhythm.     Pulses: Normal pulses.     Heart sounds: Normal heart sounds. No murmur heard.    No friction rub. No gallop.  Pulmonary:     Effort: Pulmonary effort is normal. No respiratory distress.     Breath sounds: No stridor. Wheezing and rhonchi present. No rales.  Chest:     Chest wall: No tenderness.  Musculoskeletal:        General: No swelling, tenderness,  deformity or signs of injury. Normal range of motion.     Right lower leg: No edema.     Left lower leg: No edema.  Skin:    General: Skin is warm and dry.     Capillary Refill: Capillary refill takes less than 2 seconds.     Coloration: Skin is not jaundiced or pale.     Findings: No bruising, erythema, lesion or rash.  Neurological:     General: No focal deficit present.     Mental Status: She is alert and oriented to person, place, and time. Mental status is at baseline.     Cranial Nerves: No cranial nerve deficit.     Sensory: No sensory deficit.     Motor: No weakness.     Coordination: Coordination normal.  Psychiatric:        Mood and Affect: Mood normal.        Behavior: Behavior normal.        Thought Content: Thought content normal.        Judgment: Judgment normal.     No results found for any visits on 07/03/22.  Assessment & Plan     Problem List Items Addressed This Visit       Cardiovascular and Mediastinum   HYPERTENSION, BENIGN ESSENTIAL    Chronic, stable per home readings Pt took midodrine 10 mg prior to appt Elevated in office today        Other   DOE (dyspnea on exertion) - Primary    Acute on chronic, reports 10 times/week use of albuterol  Request for refill albuterol Request for refill of advair  Referral back to pulm given missed CT follow up 07/2021      Relevant Medications   fluticasone-salmeterol (ADVAIR) 100-50 MCG/ACT AEPB   Other Relevant Orders   Ambulatory referral to Pulmonology   DG Chest 2 View   History of lung cancer    Acute on chronic DOE with use of albuterol 10 times/week use of albuterol  Request for refill albuterol Request for refill of advair  Referral back to pulm given missed CT follow up 07/2021 Endorses dyspnea, cough, active wheezing and increased sputum Denies any URI or covid/flu exposure      Relevant Medications   fluticasone-salmeterol (ADVAIR) 100-50 MCG/ACT AEPB   Other Relevant Orders   Ambulatory  referral to Pulmonology   DG Chest 2 View   Return if symptoms worsen or fail to improve.     Vonna Kotyk, FNP, have reviewed all documentation for this visit. The documentation on 07/03/22 for the exam, diagnosis, procedures, and orders are all accurate and complete.  Gwyneth Sprout, [place] [phone] (phone) [phone] (fax)  [place]

## 2022-07-07 ENCOUNTER — Encounter: Payer: Self-pay | Admitting: Family Medicine

## 2022-07-07 NOTE — Progress Notes (Signed)
Normal chest x-ray given previous CT surgeries.

## 2022-07-09 ENCOUNTER — Telehealth: Payer: Self-pay

## 2022-07-09 ENCOUNTER — Encounter: Payer: Self-pay | Admitting: Family Medicine

## 2022-07-09 NOTE — Telephone Encounter (Signed)
Copied from [place]. Topic: Referral - Status >> Jul 09, 2022  3:12 PM Ja-Kwan M wrote: Reason for CRM: Pt daughter Candice requests call back to discuss reason pt was referred to Pulmonology. Cb# [phone]

## 2022-07-14 DIAGNOSIS — N3020 Other chronic cystitis without hematuria: Secondary | ICD-10-CM | POA: Diagnosis not present

## 2022-07-17 ENCOUNTER — Other Ambulatory Visit: Payer: Self-pay | Admitting: Family Medicine

## 2022-08-02 NOTE — Progress Notes (Signed)
Cardiology Office Note Date:  05/22/2022  Patient ID:  Bianca [name], Bianca [name] [birth year], MRN [id number] PCP:  Gwyneth Sprout, FNP  Cardiologist:  Dr. Lovena Le    Chief Complaint:  pt/daughter requested an appt   History of Present Illness: Bianca [name] is a 82 y.o. female with history of orthostatic hypotension/autonomic dysfunction, HTN, AFib, COPD, asthma, anxiety, smoker.  She saw Dr. Lovena Le, 09/2019, she was doing well, maintaining SR on flecainide, advised to check BP prior to taking midodrine with elevated BP  In the interim has been found with a lung mass  She was admitted to [facility]/11/2020 after a fall (uncertain mechanism, pt did not think she fainted)and hip fracture.  She had some post anesthesia delirium, though otherwise unremarkable post op course.  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 Despite compression strategies remained markedly orthostatic, mestinon was added to her midodrine which had to be reduced 2/2 supine HTN. AM cortisol level was normal, carotid US without obstructive disease. Orthostatic BPs improved She was discharged 02/23/2020  I saw her Sept 2021 She is accompanied by her daughter. She reports her fall as possibly syncope, she had been seated on the porch and had gotten up to go inside, her daughter ahead of her to keep the dogs inside, and she fell of the side of he deck, not terribly high only a foot or so off the ground.  She does not recall "waking up" necessarily, but assumes she fainted. She has done quite well since her discharge, is tolerating PT well, and her daughter mentions she has even stood for several minutes without getting dizzy. She does not have any cardiac awareness, no palpitations or CP. She does not wear the support stocking or abdominal binder, even though her daughter notes they helped her quite a  bit. She has not had near syncope or syncope since home on the current regime. She has not smoked since her discharge.  She is pending thoracoscopy and biopsy at [facility], scheduled for 04/11/20. They have instructed her to stop her Eliquis on the 18th. She denies any bleeding or signs of bleeding No changes were made   left lower lobectomy 04/11/2020 for lung cancer  She saw Dr. Lovena Le  06/18/20 no syncope,  with some instructions on midodrine use/management  07/23/20: was post TIA, ER visit with high BP's mestinon was stopped there though at her visit with him, fatigued, BP low again and mestinon restarted  She has seen Dr. Lorelei Pont a few times since then, most recently 04/29/21, on midodrine, mestinon AND as well as Toprol for HTN She had been in the ER with UTI associated hallucinations. Her BP better, orthostatic hypotension felt multifactorial in a patient who is frail, malnourished, underlying COPD, status post recent left lower lobectomy She was pending a neuro evaluation with some question of Parkinson's Encouarged compression socks Planned to see her in 4 mo  Saw A. Tillery, PA-C 06/10/21, reported daily orthostatic symptoms, encouraged compression wear, discussed midodrine titration for symptoms, mestinon continued, (discussed prior failure of florinef).  Discussed failure to thrive, pending P T  I saw her May 2023 She is accompanied by her caregiver Bianca [name] and her daughter Bianca [name] is on the phone. She states Dr. Josefa Half stopped her losartan and started the Toprol.   Generally has good periods from her orthostatic symptoms and periods of increased  symptoms. They all sound very familiar with her strategies and care. Her usual times of increased symptoms and triggers. Good symptom recognition Good oral hydration and salt replacement She wears thigh sleeves. She has had a good 3 weeks, ambulates typically with a walker Just got a wheelchair for longer out of the house  stuff. She is getting some therapy it seems intermittently, seems the staff is a bit unpredictable She has a recumbent bike that she uses regularly No CP, palpitations Some mild DOE since her lung surgery  I felt they were doing quite well, continued Toprol given good BP (rechecked myself and was 146/78)  and stability of symptoms. He daughter inquired about seeing a specialist for orthostatic hypotension/autonomic dysfunction, planned to reach out to dr. Lovena Le, perhaps referral to Dr. Caryl Comes   Daughter has communicated intermittently, in Sept requesting an increase in her midodrine and was moved to 10mg  BID prn via Dr. Lovena Le. And more recently 05/04/22 Reported 10mg  TID, reported pt feeling fatigued, unsteady Lower BPs in AM, higher in the afternoons.   I saw her 05/22/22, She is accompanied by [name] her live-in caregiver. She continues to have spells, has noted when taking the midodrine TID she does better, not perfect but less often has to lay on the floor. Some days she can barely make it from the car to inside the house without having to lay down. TID dosing helps this. She has not had full syncope. She has not had symptoms on the commode in a long time, no near syncope or symptoms seated or supine. No CP She has some baseline SOB that is unchanged. Infrequent palpitations No bleeding or signs of bleeding She was orthostatic with a 82mmHg drop in her BP  Toprol reduced and midodrine increased They requested to see Dr. Lovena Le at her next visit 82mo  Daughter reaching out recently with an increase in her symptoms, Dr. Lovena Le mentioned his schedule was unavailable, she was agreeable to be seen by APP.  TODAY Comes with her care provider [name]) and her daughter. The patient reports doing OK, that her daughter and [name] were conerned, not particularly was she  (Her daughter mentions later in private she has noticed her Mom's declining memory). The patient denies any CP,  palpitations. She has some mild DOE that is at baseline She continues with orthostatic near syncope, particularly from a standing position. Having spells again on the commode. Occasionally and of increasing frequency of late while seated she will have spells, seem to slump a little, stare off maybe? When brought to supine she gets better.  She has not had falls. [name] reports these are the same exact presentation of her symptoms, but more often of late.  Has days in between with no symptoms. Noted by one of the days that she had a spell for unclear reason she had only given her a 1/2 tab of midodrine instead of the full tablet.  She also had a UTI with the up tick in symptoms. Probably can drink more water and less soda, coffee as well. Sounds like they do OK with salt intake    Past Medical History:  Diagnosis Date   Anxiety    Arthritis    Asthma    COPD (chronic obstructive pulmonary disease) (HCC)    HOH (hard of hearing)    Hypertension    Wears dentures    top and partial bottom   Wears glasses     Past Surgical History:  Procedure Laterality Date  APPENDECTOMY     BREAST LUMPECTOMY WITH NEEDLE LOCALIZATION Right 04/21/2013   Procedure: BREAST LUMPECTOMY WITH NEEDLE LOCALIZATION;  Surgeon: Shelly Rubenstein, MD;  Location: [facility];  Service: General;  Laterality: Right;   BREAST SURGERY  1985   cyst rt br   MASTECTOMY W/ SENTINEL NODE BIOPSY Left 04/21/2013   Procedure: MASTECTOMY WITH SENTINEL LYMPH NODE BIOPSY;  Surgeon: Shelly Rubenstein, MD;  Location: [facility];  Service: General;  Laterality: Left;   OTHER SURGICAL HISTORY     cysts removed from foot   OTHER SURGICAL HISTORY     cysts removed from breast   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   Procedure: TOTAL HIP ARTHROPLASTY ANTERIOR APPROACH;  Surgeon: Kathryne Hitch, MD;  Location: [facility];  Service: Orthopedics;  Laterality: Right;   TUBAL LIGATION       Current Outpatient Medications  Medication Sig Dispense Refill   acetaminophen (TYLENOL) 500 MG tablet Take 1,000 mg by mouth every 6 (six) hours as needed (for headaches).     albuterol (VENTOLIN HFA) 108 (90 Base) MCG/ACT inhaler Inhale 2 puffs into the lungs every 6 (six) hours as needed for wheezing or shortness of breath. 8 g 2   apixaban (ELIQUIS) 2.5 MG TABS tablet Take 1 tablet (2.5 mg total) by mouth 2 (two) times daily. 60 tablet 1   atorvastatin (LIPITOR) 20 MG tablet TAKE 1 TABLET BY MOUTH DAILY. 90 tablet 3   busPIRone (BUSPAR) 7.5 MG tablet Take 7.5 mg by mouth as needed.     Calcium Carb-Cholecalciferol (CALCIUM+D3 PO) Take 1 tablet by mouth daily.     Cholecalciferol (VITAMIN D3) 50 MCG (2000 UT) TABS Take 2,000 Units by mouth daily.     ferrous gluconate (FERGON) 324 MG tablet Take 1 tablet (324 mg total) by mouth 2 (two) times daily with a meal.  3   fluticasone-salmeterol (ADVAIR) 100-50 MCG/ACT AEPB INHALE 1 PUFF INTO THE LUNGS TWICE DAILY 60 each 0   lactose free nutrition (BOOST) LIQD Take 237 mLs by mouth 2 (two) times daily between meals.     metoprolol succinate (TOPROL-XL) 25 MG 24 hr tablet TAKE 1 TABLET BY MOUTH ONCE DAILY. 30 tablet 6   midodrine (PROAMATINE) 5 MG tablet Take 2 tablets (10 mg total) by mouth 2 (two) times daily as needed. 120 tablet 7   nitrofurantoin, macrocrystal-monohydrate, (MACROBID) 100 MG capsule Take 100 mg by mouth at bedtime.     pyridostigmine (MESTINON) 60 MG tablet TAKE 0.5 TABLET BY MOUTH MORNING AND NIGHT. Please call to schedule an overdue appointment with Dr. Ladona Ridgel for refills, [phone], thank you. 1st attempt. 30 tablet 0   trimethoprim (TRIMPEX) 100 MG tablet Take 100 mg by mouth daily.     vitamin C (ASCORBIC ACID) 500 MG tablet Take 500 mg by mouth daily.      No current facility-administered medications for this visit.    Allergies:   Alprazolam, Buspar [buspirone], Mirtazapine, and Other   Social History:  The  patient  reports that she has quit smoking. Her smoking use included cigarettes. She smoked an average of .25 packs per day. She has never used smokeless tobacco. She reports that she does not drink alcohol and does not use drugs.   Family History:  The patient's family history includes CAD in her mother; Thyroid disease in her sister.  ROS:  Please see the history of present illness.  All other systems are reviewed and otherwise negative.  PHYSICAL EXAM:  VS:  LMP  (LMP Unknown) BMI: There is no height or weight on file to calculate BMI. Very thin elderly female, in no acute distress, somewhat chronically ill appearing HEENT: normocephalic, atraumatic  Neck: no JVD, carotid bruits or masses Cardiac:  RRR; no significant murmurs, no rubs, or gallops Lungs:  CTA b/l, no wheezing, rhonchi or rales  Abd: soft, nontender MS: no deformity, advanced atrophy even for her age Ext:  no edema  Skin: warm and dry, no rash Neuro:  No gross deficits appreciated Psych: euthymic mood, full affect   EKG: not done today   07/16/2020: TTE 1. Left ventricular ejection fraction, by estimation, is 60 to 65%. The  left ventricle has normal function. The left ventricle has no regional  wall motion abnormalities. Indeterminate diastolic filling due to E-A  fusion.   2. Right ventricular systolic function is normal. The right ventricular  size is normal.   3. The mitral valve is normal in structure. No evidence of mitral valve  regurgitation. No evidence of mitral stenosis.   4. The aortic valve is normal in structure. Aortic valve regurgitation is  not visualized. Mild aortic valve sclerosis is present, with no evidence  of aortic valve stenosis.   5. The inferior vena cava is normal in size with greater than 50%  respiratory variability, suggesting right atrial pressure of 3 mmHg.   Echocardiogram 01/22/2019: Impressions: 1. The left ventricle has normal systolic function with an ejection   fraction of 60-65%. The cavity size was normal. There is moderate  concentric left ventricular hypertrophy. Left ventricular diastolic  Doppler parameters are consistent with  pseudonormalization. Elevated left atrial and left ventricular  end-diastolic pressures.   2. The right ventricle has normal systolic function. The cavity was  normal. There is no increase in right ventricular wall thickness. Right  ventricular systolic pressure is moderately elevated.   3. Mitral valve regurgitation is moderate by color flow Doppler.   4. The aortic valve is tricuspid. Moderate thickening of the aortic  valve. Moderate calcification of the aortic valve. Aortic valve  regurgitation was not assessed by color flow Doppler.   5. The inferior vena cava was dilated in size with <50% respiratory  variability.   Recent Labs: No results found for requested labs within last 365 days.  No results found for requested labs within last 365 days.   CrCl cannot be calculated (Patient's most recent lab result is older than the maximum 21 days allowed.).   Wt Readings from Last 3 Encounters:  12/17/21 119 lb (54 kg)  11/10/21 114 lb (51.7 kg)  06/10/21 114 lb 9.6 oz (52 kg)     Other studies reviewed: Additional studies/records reviewed today include: summarized above  ASSESSMENT AND PLAN:  1.   HTN 2.  Orthostatic hypotension     Re-discussed safety, symptom recognition, adequate hydration and abdominal binder when OOB      Long standing problem Description of the events by their reort has not changed in presentation, but increased of late Discussed infection can trigger worsening symptoms Discussed perhaps holding Toprol on days that she seems to be more symptomatic or taking an extra 1/2 tablet of the mestinon.   3. Paroxysmal Afib     CHA2DS2Vasc is 4, on Eliquis, appropriately dosed for age and weight     No clear burden by symptoms  4. Declining memory Her daughter is worried about not  having access to her finances/personal affairs How to approach declining  memory  I advised that she see her PMD for some baseline testing and go from there. Also advised talking to PMD about in home therapy, they noted historically this has helped her    Disposition: she has an appt with dr. Ladona Ridgel in place, they have been wanting to see him and will goahead and keep that in place.    Current medicines are reviewed at length with the patient today.  The patient did not have any concerns regarding medicines.  Norma Fredrickson, PA-C 05/22/2022 6:11 AM     [facility] [address] [place] [address] [phone] (office)  [phone] (fax)

## 2022-08-05 ENCOUNTER — Other Ambulatory Visit: Payer: Self-pay | Admitting: Internal Medicine

## 2022-08-05 ENCOUNTER — Encounter: Payer: Self-pay | Admitting: Physician Assistant

## 2022-08-05 ENCOUNTER — Ambulatory Visit: Payer: PPO | Attending: Physician Assistant | Admitting: Physician Assistant

## 2022-08-05 ENCOUNTER — Other Ambulatory Visit: Payer: Self-pay | Admitting: Physician Assistant

## 2022-08-05 ENCOUNTER — Other Ambulatory Visit: Payer: Self-pay | Admitting: Family Medicine

## 2022-08-05 VITALS — BP 140/84 | HR 82 | Ht 66.0 in | Wt 123.0 lb

## 2022-08-05 DIAGNOSIS — I480 Paroxysmal atrial fibrillation: Secondary | ICD-10-CM

## 2022-08-05 DIAGNOSIS — I951 Orthostatic hypotension: Secondary | ICD-10-CM

## 2022-08-05 DIAGNOSIS — I10 Essential (primary) hypertension: Secondary | ICD-10-CM

## 2022-08-05 NOTE — Patient Instructions (Signed)
Medication Instructions:   Your physician recommends that you continue on your current medications as directed. Please refer to the Current Medication list given to you today.  *If you need a refill on your cardiac medications before your next appointment, please call your pharmacy*   Lab Work: NONE ORDERED  TODAY    If you have labs (blood work) drawn today and your tests are completely normal, you will receive your results only by: MyChart Message (if you have MyChart) OR A paper copy in the mail If you have any lab test that is abnormal or we need to change your treatment, we will call you to review the results.   Testing/Procedures: NONE ORDERED  TODAY     Follow-Up: At [facility], you and your health needs are our priority.  As part of our continuing mission to provide you with exceptional heart care, we have created designated Provider Care Teams.  These Care Teams include your primary Cardiologist (physician) and Advanced Practice Providers (APPs -  Physician Assistants and Nurse Practitioners) who all work together to provide you with the care you need, when you need it.  We recommend signing up for the patient portal called "MyChart".  Sign up information is provided on this After Visit Summary.  MyChart is used to connect with patients for Virtual Visits (Telemedicine).  Patients are able to view lab/test results, encounter notes, upcoming appointments, etc.  Non-urgent messages can be sent to your provider as well.   To learn more about what you can do with MyChart, go to [email or web address].    Your next appointment:   6 week(s)  Provider:   Lewayne Bunting, MD  ONLY    Other Instructions

## 2022-08-05 NOTE — Telephone Encounter (Signed)
Prescription refill request for Eliquis received. Indication: Afib  Last office visit: 05/22/22 Bianca [name])  Scr:  1.47 (06/03/22)  Age: 82 Weight: 55.1kg  Appropriate dose and refill sent to requested pharmacy.

## 2022-08-05 NOTE — Telephone Encounter (Signed)
Requested Prescriptions  Pending Prescriptions Disp Refills   atorvastatin (LIPITOR) 20 MG tablet [Pharmacy Med Name: ATORVASTATIN CALCIUM 20 MG TAB] 90 tablet 0    Sig: TAKE 1 TABLET BY MOUTH DAILY.     Cardiovascular:  Antilipid - Statins Failed - 08/05/2022 12:21 PM      Failed - Lipid Panel in normal range within the last 12 months    Cholesterol, Total  Date Value Ref Range Status  10/30/2019 150 100 - 199 mg/dL Final   Cholesterol  Date Value Ref Range Status  07/15/2020 167 0 - 200 mg/dL Final   LDL Chol Calc (NIH)  Date Value Ref Range Status  10/30/2019 86 0 - 99 mg/dL Final   LDL Cholesterol  Date Value Ref Range Status  07/15/2020 99 0 - 99 mg/dL Final    Comment:           Total Cholesterol/HDL:CHD Risk Coronary Heart Disease Risk Table                     Men   Women  1/2 Average Risk   3.4   3.3  Average Risk       5.0   4.4  2 X Average Risk   9.6   7.1  3 X Average Risk  23.4   11.0        Use the calculated Patient Ratio above and the CHD Risk Table to determine the patient's CHD Risk.        ATP III CLASSIFICATION (LDL):  <100     mg/dL   Optimal  492-988  mg/dL   Near or Above                    Optimal  130-159  mg/dL   Borderline  213-653  mg/dL   High  >664     mg/dL   Very High Performed at [facility], [address] [place], [place] [address]    Direct LDL  Date Value Ref Range Status  04/24/2010 94.8 mg/dL Final    Comment:    See lab report for associated comment(s)   HDL  Date Value Ref Range Status  07/15/2020 53 >40 mg/dL Final  52/85/9186 48 >02 mg/dL Final   Triglycerides  Date Value Ref Range Status  07/15/2020 75 <150 mg/dL Final         Passed - Patient is not pregnant      Passed - Valid encounter within last 12 months    Recent Outpatient Visits           1 month ago DOE (dyspnea on exertion)   [facility] Jacky Kindle, FNP   1 year ago No-show for appointment   [facility] Jacky Kindle, FNP   1 year ago Age-related physical debility   [facility], Marzella Schlein, MD   1 year ago No-show for appointment   [facility] Jacky Kindle, FNP   1 year ago Erroneous encounter - disregard   [facility] Jacky Kindle, FNP       Future Appointments             Today Sheilah Pigeon, PA-C [facility] A Dept Of [place]. [facility], [facility]   In 2 weeks Ladona Ridgel, Doylene Canning, MD [facility] [address] A Dept Of [place]. [facility], [facility]

## 2022-08-17 ENCOUNTER — Other Ambulatory Visit: Payer: Self-pay | Admitting: Family Medicine

## 2022-08-21 ENCOUNTER — Ambulatory Visit: Payer: PPO | Attending: Internal Medicine | Admitting: Internal Medicine

## 2022-08-21 ENCOUNTER — Encounter: Payer: Self-pay | Admitting: Internal Medicine

## 2022-08-21 VITALS — BP 130/68 | HR 81 | Ht 66.0 in | Wt 123.0 lb

## 2022-08-21 DIAGNOSIS — I951 Orthostatic hypotension: Secondary | ICD-10-CM | POA: Diagnosis not present

## 2022-08-21 DIAGNOSIS — I480 Paroxysmal atrial fibrillation: Secondary | ICD-10-CM | POA: Diagnosis not present

## 2022-08-21 NOTE — Progress Notes (Signed)
HPI Bianca [name] returns today for follow-up.  She is a very pleasant 82 year old woman with a history of severe orthostatic hypotension, lung cancer status postresection, hypertension, and syncope secondary to autonomic dysfunction.  She has been on midodrine and Mestinon.  Her symptoms are reasonably well-controlled.  Her blood pressure has been running in the 130 range.  Her appetite has improved.  She has chronic fatigue but this is not any worse than it has been.  She has gained a little bit of weight back. Allergies  Allergen Reactions   Alprazolam Other (See Comments)    Combativeness and aggression   Buspar [Buspirone] Other (See Comments)    Causes evident lethargy and is even more pronounced when taken in conjunction with Remeron   Mirtazapine Other (See Comments)    Causes evident lethargy    Other Other (See Comments)    Sedation- Causes agitation and aggression  Changing patient's "usual" medication regimen usually causes "a lot of issues with her, physically"     Current Outpatient Medications  Medication Sig Dispense Refill   acetaminophen (TYLENOL) 500 MG tablet Take 1,000 mg by mouth every 6 (six) hours as needed (for headaches).     albuterol (VENTOLIN HFA) 108 (90 Base) MCG/ACT inhaler Inhale 2 puffs into the lungs every 6 (six) hours as needed for wheezing or shortness of breath. 8 g 2   apixaban (ELIQUIS) 2.5 MG TABS tablet TAKE ONE (1) TABLET BY MOUTH TWO TIMES PER DAY 60 tablet 5   atorvastatin (LIPITOR) 20 MG tablet TAKE 1 TABLET BY MOUTH DAILY. 90 tablet 0   busPIRone (BUSPAR) 7.5 MG tablet Take 7.5 mg by mouth as needed.     Calcium Carb-Cholecalciferol (CALCIUM+D3 PO) Take 1 tablet by mouth daily.     Cholecalciferol (VITAMIN D3) 50 MCG (2000 UT) TABS Take 2,000 Units by mouth daily.     ferrous gluconate (FERGON) 324 MG tablet Take 1 tablet (324 mg total) by mouth 2 (two) times daily with a meal.  3   fluticasone-salmeterol (ADVAIR) 100-50 MCG/ACT AEPB  INHALE 1 PUFF INTO THE LUNGS TWICE DAILY 60 each 0   lactose free nutrition (BOOST) LIQD Take 237 mLs by mouth 2 (two) times daily between meals.     metoprolol succinate (TOPROL-XL) 25 MG 24 hr tablet Take 0.5 tablets (12.5 mg total) by mouth daily. 45 tablet 1   midodrine (PROAMATINE) 10 MG tablet TAKE 1 TABLET BY MOUTH 3 TIMES DAILY WITH MEALS 270 tablet 3   Multiple Vitamins-Minerals (MULTIVITAMIN WITH MINERALS) tablet Take 1 tablet by mouth daily.     nitrofurantoin, macrocrystal-monohydrate, (MACROBID) 100 MG capsule Take 100 mg by mouth at bedtime.     pyridostigmine (MESTINON) 60 MG tablet TAKE 0.5 TABLET BY MOUTH MORNING AND NIGHT. Please call to schedule an overdue appointment with Dr. Lovena Le for refills, [phone], thank you. 1st attempt. 30 tablet 0   trimethoprim (TRIMPEX) 100 MG tablet Take 100 mg by mouth daily.     vitamin C (ASCORBIC ACID) 500 MG tablet Take 500 mg by mouth daily.      No current facility-administered medications for this visit.     Past Medical History:  Diagnosis Date   Anxiety    Arthritis    Asthma    COPD (chronic obstructive pulmonary disease) ([place])    HOH (hard of hearing)    Hypertension    Wears dentures    top and partial bottom   Wears glasses  ROS:   All systems reviewed and negative except as noted in the HPI.   Past Surgical History:  Procedure Laterality Date   APPENDECTOMY     BREAST LUMPECTOMY WITH NEEDLE LOCALIZATION Right 04/21/2013   Procedure: BREAST LUMPECTOMY WITH NEEDLE LOCALIZATION;  Surgeon: Harl Bowie, MD;  Location: [facility];  Service: General;  Laterality: Right;   BREAST SURGERY  1985   cyst rt br   MASTECTOMY W/ SENTINEL NODE BIOPSY Left 04/21/2013   Procedure: MASTECTOMY WITH SENTINEL LYMPH NODE BIOPSY;  Surgeon: Harl Bowie, MD;  Location: [place];  Service: General;  Laterality: Left;   OTHER SURGICAL HISTORY     cysts removed from foot   OTHER SURGICAL  HISTORY     cysts removed from breast   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   Procedure: TOTAL HIP ARTHROPLASTY ANTERIOR APPROACH;  Surgeon: Mcarthur Rossetti, MD;  Location: [facility];  Service: Orthopedics;  Laterality: Right;   TUBAL LIGATION       Family History  Problem Relation Age of Onset   CAD Mother        possible small heart attack   Thyroid disease Sister        she thinks hyperthyroidism     Social History   Socioeconomic History   Marital status: Widowed    Spouse name: Not on file   Number of children: 2   Years of education: Not on file   Highest education level: Some college, no degree  Occupational History   Occupation: retired    Fish farm manager: RETIRED  Tobacco Use   Smoking status: Former    Packs/day: 0.50    Years: 64.00    Total pack years: 32.00    Types: Cigarettes    Start date: 07/20/1954    Quit date: 03/19/2019    Years since quitting: 3.4   Smokeless tobacco: Never  Vaping Use   Vaping Use: Never used  Substance and Sexual Activity   Alcohol use: No   Drug use: No   Sexual activity: Not on file    Comment: 1-2 daily  Other Topics Concern   Not on file  Social History Narrative   ** Merged History Encounter **       Has a living will    Social Determinants of Health   Financial Resource Strain: Low Risk  (11/10/2021)   Overall Financial Resource Strain (CARDIA)    Difficulty of Paying Living Expenses: Not hard at all  Food Insecurity: No Food Insecurity (11/10/2021)   Hunger Vital Sign    Worried About Running Out of Food in the Last Year: Never true    [place] in the Last Year: Never true  Transportation Needs: No Transportation Needs (11/10/2021)   PRAPARE - Hydrologist (Medical): No    Lack of Transportation (Non-Medical): No  Physical Activity: Insufficiently Active (11/10/2021)   Exercise Vital Sign    Days of Exercise per Week: 5 days    Minutes of Exercise per Session: 20 min   Stress: No Stress Concern Present (11/10/2021)   [place]    Feeling of Stress : Not at all  Social Connections: Socially Isolated (11/10/2021)   Social Connection and Isolation Panel [NHANES]    Frequency of Communication with Friends and Family: More than three times a week    Frequency of Social Gatherings with Friends and Family: Once  a week    Attends Religious Services: Never    Active Member of Clubs or Organizations: No    Attends Archivist Meetings: Never    Marital Status: Widowed  Intimate Partner Violence: Not At Risk (11/10/2021)   Humiliation, Afraid, Rape, and Kick questionnaire    Fear of Current or Ex-Partner: No    Emotionally Abused: No    Physically Abused: No    Sexually Abused: No     BP 130/68   Pulse 81   Ht 5\' 6"  (1.676 m)   Wt 123 lb (55.8 kg)   LMP  (LMP Unknown)   SpO2 98%   BMI 19.85 kg/m   Physical Exam:  Well appearing NAD HEENT: Unremarkable Neck:  No JVD, no thyromegally Lymphatics:  No adenopathy Back:  No CVA tenderness Lungs:  Clear HEART:  Regular rate rhythm, no murmurs, no rubs, no clicks Abd:  soft, positive bowel sounds, no organomegally, no rebound, no guarding Ext:  2 plus pulses, no edema, no cyanosis, no clubbing Skin:  No rashes no nodules Neuro:  CN II through XII intact, motor grossly intact  EKG  DEVICE  Normal device function.  See PaceArt for details.   Assess/Plan:  Autonomic dysfunction -overall, she appears a bit better.  She is tolerating the Mestinon and midodrine.  She is taking Mestinon twice a day and I told her that if needed she could take an extra half dose around lunchtime. 2. Failure to thrive - her weight is up almost 20 pounds since her last visit.  I have encouraged the patient to continue her current diet and keep it high in salt.  She is also encouraged to stay active.  Cristopher Peru, MD

## 2022-08-21 NOTE — Patient Instructions (Signed)

## 2022-08-31 ENCOUNTER — Other Ambulatory Visit: Payer: Self-pay | Admitting: Family Medicine

## 2022-08-31 DIAGNOSIS — Z85118 Personal history of other malignant neoplasm of bronchus and lung: Secondary | ICD-10-CM

## 2022-08-31 DIAGNOSIS — R0609 Other forms of dyspnea: Secondary | ICD-10-CM

## 2022-09-03 ENCOUNTER — Other Ambulatory Visit: Payer: Self-pay | Admitting: Internal Medicine

## 2022-10-28 ENCOUNTER — Other Ambulatory Visit: Payer: Self-pay | Admitting: Family Medicine

## 2022-10-28 ENCOUNTER — Other Ambulatory Visit: Payer: Self-pay | Admitting: Physician Assistant

## 2022-10-29 NOTE — Telephone Encounter (Signed)
Requested medication (s) are due for refill today: yes  Requested medication (s) are on the active medication list: yes  Last refill:  08/05/22 #90/0  Future visit scheduled: no  Notes to clinic:  Unable to refill per protocol due to failed labs, no updated results.    Requested Prescriptions  Pending Prescriptions Disp Refills   atorvastatin (LIPITOR) 20 MG tablet [Pharmacy Med Name: ATORVASTATIN CALCIUM 20 MG TAB] 90 tablet 0    Sig: TAKE 1 TABLET BY MOUTH DAILY.     Cardiovascular:  Antilipid - Statins Failed - 10/28/2022  7:23 PM      Failed - Lipid Panel in normal range within the last 12 months    Cholesterol, Total  Date Value Ref Range Status  10/30/2019 150 100 - 199 mg/dL Final   Cholesterol  Date Value Ref Range Status  07/15/2020 167 0 - 200 mg/dL Final   LDL Chol Calc (NIH)  Date Value Ref Range Status  10/30/2019 86 0 - 99 mg/dL Final   LDL Cholesterol  Date Value Ref Range Status  07/15/2020 99 0 - 99 mg/dL Final    Comment:           Total Cholesterol/HDL:CHD Risk Coronary Heart Disease Risk Table                     Men   Women  1/2 Average Risk   3.4   3.3  Average Risk       5.0   4.4  2 X Average Risk   9.6   7.1  3 X Average Risk  23.4   11.0        Use the calculated Patient Ratio above and the CHD Risk Table to determine the patient's CHD Risk.        ATP III CLASSIFICATION (LDL):  <100     mg/dL   Optimal  130-865  mg/dL   Near or Above                    Optimal  130-159  mg/dL   Borderline  784-696  mg/dL   High  >295     mg/dL   Very High Performed at [facility], [address] [place], [place] [address]    Direct LDL  Date Value Ref Range Status  04/24/2010 94.8 mg/dL Final    Comment:    See lab report for associated comment(s)   HDL  Date Value Ref Range Status  07/15/2020 53 >40 mg/dL Final  24/40/1027 48 >25 mg/dL Final   Triglycerides  Date Value Ref Range Status  07/15/2020 75 <150 mg/dL Final          Passed - Patient is not pregnant      Passed - Valid encounter within last 12 months    Recent Outpatient Visits           3 months ago DOE (dyspnea on exertion)   [facility] Jacky Kindle, FNP   1 year ago No-show for appointment   [facility] Jacky Kindle, FNP   1 year ago Age-related physical debility   [facility] Beryle Flock, Marzella Schlein, MD   1 year ago No-show for appointment   [facility] Merita Norton T, FNP   1 year ago Erroneous encounter - disregard   [facility] Jacky Kindle, FNP

## 2022-11-18 ENCOUNTER — Ambulatory Visit (INDEPENDENT_AMBULATORY_CARE_PROVIDER_SITE_OTHER): Payer: PPO

## 2022-11-18 VITALS — Ht 66.0 in | Wt 123.0 lb

## 2022-11-18 DIAGNOSIS — Z0000 Encounter for general adult medical examination without abnormal findings: Secondary | ICD-10-CM | POA: Diagnosis not present

## 2022-11-18 NOTE — Patient Instructions (Signed)
Bianca [name] , Thank you for taking time to come for your Medicare Wellness Visit. I appreciate your ongoing commitment to your health goals. Please review the following plan we discussed and let me know if I can assist you in the future.   These are the goals we discussed:  Goals      "I think I would like to be in therapy"     CARE PLAN ENTRY (see longitudinal plan of care for additional care plan information)  Current Barriers:  Mental Health Concerns  related to the loss of her spouse, and her medical concerns  Clinical Social Work Clinical Goal(s):  Over the next 90 days, patient will follow up with a mental health therapist for ongoing mental health treatment  as directed by SW  Interventions: Patient interviewed and appropriate assessments performed Patient discussed feelings of depression related to loss of her spouse a few ago, and difficulty dealing with her new medical diagnosis Explored with patient need for ongoing therapy to address depressed mood Provided patient with emotional support Discussed plans with patient for ongoing care management follow up and provided patient with direct contact information for care management team Will refer patient to an in network provider for long term follow up and therapy/counseling  Patient Self Care Activities:  Patient verbalizes understanding of plan to follow up with a in network provider Unable to identify in network mental health providers  Initial goal documentation      DIET - EAT MORE FRUITS AND VEGETABLES     Gain weight     Starting 04/19/17, I will continue to drink 2 cans of Boost in an effort to gain weight.      LIFESTYLE - DECREASE FALLS RISK     Recommend to remove any items from the home that may cause slips or trips.     Patient advised to follow up with AWV and vaccines     Quit Smoking     Recommend to continue efforts to reduce smoking habits until no longer smoking.        This is a list of the  screening recommended for you and due dates:  Health Maintenance  Topic Date Due   COVID-19 Vaccine (1) Never done   Zoster (Shingles) Vaccine (1 of 2) Never done   DTaP/Tdap/Td vaccine (3 - Tdap) 12/26/2018   DEXA scan (bone density measurement)  06/20/2020   Flu Shot  02/18/2023   Medicare Annual Wellness Visit  11/18/2023   Pneumonia Vaccine  Completed   HPV Vaccine  Aged Out    Advanced directives: yes  Conditions/risks identified: low falls risk  Next appointment: Follow up in one year for your annual wellness visit 11/22/2023 @ 11:15am telephone   Preventive Care 65 Years and Older, Female Preventive care refers to lifestyle choices and visits with your health care provider that can promote health and wellness. What does preventive care include? A yearly physical exam. This is also called an annual well check. Dental exams once or twice a year. Routine eye exams. Ask your health care provider how often you should have your eyes checked. Personal lifestyle choices, including: Daily care of your teeth and gums. Regular physical activity. Eating a healthy diet. Avoiding tobacco and drug use. Limiting alcohol use. Practicing safe sex. Taking low-dose aspirin every day. Taking vitamin and mineral supplements as recommended by your health care provider. What happens during an annual well check? The services and screenings done by your health care provider during your  annual well check will depend on your age, overall health, lifestyle risk factors, and family history of disease. Counseling  Your health care provider may ask you questions about your: Alcohol use. Tobacco use. Drug use. Emotional well-being. Home and relationship well-being. Sexual activity. Eating habits. History of falls. Memory and ability to understand (cognition). Work and work Astronomer. Reproductive health. Screening  You may have the following tests or measurements: Height, weight, and  BMI. Blood pressure. Lipid and cholesterol levels. These may be checked every 5 years, or more frequently if you are over 4 years old. Skin check. Lung cancer screening. You may have this screening every year starting at age 7 if you have a 30-pack-year history of smoking and currently smoke or have quit within the past 15 years. Fecal occult blood test (FOBT) of the stool. You may have this test every year starting at age 83. Flexible sigmoidoscopy or colonoscopy. You may have a sigmoidoscopy every 5 years or a colonoscopy every 10 years starting at age 58. Hepatitis C blood test. Hepatitis B blood test. Sexually transmitted disease (STD) testing. Diabetes screening. This is done by checking your blood sugar (glucose) after you have not eaten for a while (fasting). You may have this done every 1-3 years. Bone density scan. This is done to screen for osteoporosis. You may have this done starting at age 6. Mammogram. This may be done every 1-2 years. Talk to your health care provider about how often you should have regular mammograms. Talk with your health care provider about your test results, treatment options, and if necessary, the need for more tests. Vaccines  Your health care provider may recommend certain vaccines, such as: Influenza vaccine. This is recommended every year. Tetanus, diphtheria, and acellular pertussis (Tdap, Td) vaccine. You may need a Td booster every 10 years. Zoster vaccine. You may need this after age 60. Pneumococcal 13-valent conjugate (PCV13) vaccine. One dose is recommended after age 69. Pneumococcal polysaccharide (PPSV23) vaccine. One dose is recommended after age 98. Talk to your health care provider about which screenings and vaccines you need and how often you need them. This information is not intended to replace advice given to you by your health care provider. Make sure you discuss any questions you have with your health care provider. Document  Released: 08/02/2015 Document Revised: 03/25/2016 Document Reviewed: 05/07/2015 Elsevier Interactive Patient Education  2017 [employer].  Fall Prevention in the Home Falls can cause injuries. They can happen to people of all ages. There are many things you can do to make your home safe and to help prevent falls. What can I do on the outside of my home? Regularly fix the edges of walkways and driveways and fix any cracks. Remove anything that might make you trip as you walk through a door, such as a raised step or threshold. Trim any bushes or trees on the path to your home. Use bright outdoor lighting. Clear any walking paths of anything that might make someone trip, such as rocks or tools. Regularly check to see if handrails are loose or broken. Make sure that both sides of any steps have handrails. Any raised decks and porches should have guardrails on the edges. Have any leaves, snow, or ice cleared regularly. Use sand or salt on walking paths during winter. Clean up any spills in your garage right away. This includes oil or grease spills. What can I do in the bathroom? Use night lights. Install grab bars by the toilet and in  the tub and shower. Do not use towel bars as grab bars. Use non-skid mats or decals in the tub or shower. If you need to sit down in the shower, use a plastic, non-slip stool. Keep the floor dry. Clean up any water that spills on the floor as soon as it happens. Remove soap buildup in the tub or shower regularly. Attach bath mats securely with double-sided non-slip rug tape. Do not have throw rugs and other things on the floor that can make you trip. What can I do in the bedroom? Use night lights. Make sure that you have a light by your bed that is easy to reach. Do not use any sheets or blankets that are too big for your bed. They should not hang down onto the floor. Have a firm chair that has side arms. You can use this for support while you get dressed. Do  not have throw rugs and other things on the floor that can make you trip. What can I do in the kitchen? Clean up any spills right away. Avoid walking on wet floors. Keep items that you use a lot in easy-to-reach places. If you need to reach something above you, use a strong step stool that has a grab bar. Keep electrical cords out of the way. Do not use floor polish or wax that makes floors slippery. If you must use wax, use non-skid floor wax. Do not have throw rugs and other things on the floor that can make you trip. What can I do with my stairs? Do not leave any items on the stairs. Make sure that there are handrails on both sides of the stairs and use them. Fix handrails that are broken or loose. Make sure that handrails are as long as the stairways. Check any carpeting to make sure that it is firmly attached to the stairs. Fix any carpet that is loose or worn. Avoid having throw rugs at the top or bottom of the stairs. If you do have throw rugs, attach them to the floor with carpet tape. Make sure that you have a light switch at the top of the stairs and the bottom of the stairs. If you do not have them, ask someone to add them for you. What else can I do to help prevent falls? Wear shoes that: Do not have high heels. Have rubber bottoms. Are comfortable and fit you well. Are closed at the toe. Do not wear sandals. If you use a stepladder: Make sure that it is fully opened. Do not climb a closed stepladder. Make sure that both sides of the stepladder are locked into place. Ask someone to hold it for you, if possible. Clearly mark and make sure that you can see: Any grab bars or handrails. First and last steps. Where the edge of each step is. Use tools that help you move around (mobility aids) if they are needed. These include: Canes. Walkers. Scooters. Crutches. Turn on the lights when you go into a dark area. Replace any light bulbs as soon as they burn out. Set up your  furniture so you have a clear path. Avoid moving your furniture around. If any of your floors are uneven, fix them. If there are any pets around you, be aware of where they are. Review your medicines with your doctor. Some medicines can make you feel dizzy. This can increase your chance of falling. Ask your doctor what other things that you can do to help prevent falls. This information  is not intended to replace advice given to you by your health care provider. Make sure you discuss any questions you have with your health care provider. Document Released: 05/02/2009 Document Revised: 12/12/2015 Document Reviewed: 08/10/2014 Elsevier Interactive Patient Education  2017 [employer].

## 2022-11-18 NOTE — Progress Notes (Signed)
I connected with  Bianca [name] on 11/18/22 by a audio enabled telemedicine application and verified that I am speaking with the correct person using two identifiers.  Patient Location: Home  Provider Location: Office/Clinic  I discussed the limitations of evaluation and management by telemedicine. The patient expressed understanding and agreed to proceed.  Subjective:   Bianca [name] is a 82 y.o. female who presents for Medicare Annual (Subsequent) preventive examination.  Review of Systems    Cardiac Risk Factors include: advanced age (>31men, >75 women);hypertension;sedentary lifestyle     Objective:    Today's Vitals   11/18/22 1023  Weight: 123 lb (55.8 kg)  Height: 5\' 6"  (1.676 m)   Body mass index is 19.85 kg/m.     11/18/2022   10:40 AM 11/10/2021    1:21 PM 07/16/2020   11:30 AM 02/13/2020    1:00 AM 11/15/2019    2:37 PM 01/21/2019    3:34 PM 04/19/2017   11:38 AM  Advanced Directives  Does Patient Have a Medical Advance Directive? Yes Yes Yes Yes Yes No Yes  Type of Science writer of Attorney Living will Healthcare Power of [place];Living will  Healthcare Power of [place];Living will  Does patient want to make changes to medical advance directive?  Yes (Inpatient - patient defers changing a medical advance directive and declines information at this time) No - Patient declined No - Patient declined     Copy of Healthcare Power of Attorney in Chart?  Yes - validated most recent copy scanned in chart (See row information) No - copy requested  No - copy requested  No - copy requested  Would patient like information on creating a medical advance directive?      Yes (ED - Information included in AVS)     Current Medications (verified)  Allergies (verified) Alprazolam, Buspar [buspirone], Mirtazapine, and Other   History: Past Medical History:  Diagnosis Date   Anxiety    Arthritis    Asthma    COPD (chronic  obstructive pulmonary disease) (HCC)    HOH (hard of hearing)    Hypertension    Wears dentures    top and partial bottom   Wears glasses    Past Surgical History:  Procedure Laterality Date   APPENDECTOMY     BREAST LUMPECTOMY WITH NEEDLE LOCALIZATION Right 04/21/2013   Procedure: BREAST LUMPECTOMY WITH NEEDLE LOCALIZATION;  Surgeon: Shelly Rubenstein, MD;  Location: [facility];  Service: General;  Laterality: Right;   BREAST SURGERY  1985   cyst rt br   MASTECTOMY W/ SENTINEL NODE BIOPSY Left 04/21/2013   Procedure: MASTECTOMY WITH SENTINEL LYMPH NODE BIOPSY;  Surgeon: Shelly Rubenstein, MD;  Location: [facility];  Service: General;  Laterality: Left;   OTHER SURGICAL HISTORY     cysts removed from foot   OTHER SURGICAL HISTORY     cysts removed from breast   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   Procedure: TOTAL HIP ARTHROPLASTY ANTERIOR APPROACH;  Surgeon: Kathryne Hitch, MD;  Location: [facility];  Service: Orthopedics;  Laterality: Right;   TUBAL LIGATION     Family History  Problem Relation Age of Onset   CAD Mother        possible small heart attack   Thyroid disease Sister        she thinks hyperthyroidism   Social History   Socioeconomic History   Marital status: Widowed  Spouse name: Not on file   Number of children: 2   Years of education: Not on file   Highest education level: Some college, no degree  Occupational History   Occupation: retired    Associate Professor: RETIRED  Tobacco Use   Smoking status: Former    Packs/day: 0.50    Years: 64.00    Additional pack years: 0.00    Total pack years: 32.00    Types: Cigarettes    Start date: 07/20/1954    Quit date: 03/19/2019    Years since quitting: 3.6   Smokeless tobacco: Never  Vaping Use   Vaping Use: Never used  Substance and Sexual Activity   Alcohol use: No   Drug use: No   Sexual activity: Not on file    Comment: 1-2 daily  Other Topics Concern   Not on file   Social History Narrative   ** Merged History Encounter **       Has a living will    Social Determinants of Health   Financial Resource Strain: Low Risk  (11/18/2022)   Overall Financial Resource Strain (CARDIA)    Difficulty of Paying Living Expenses: Not very hard  Food Insecurity: No Food Insecurity (11/18/2022)   Hunger Vital Sign    Worried About Running Out of Food in the Last Year: Never true    Ran Out of Food in the Last Year: Never true  Transportation Needs: No Transportation Needs (11/18/2022)   PRAPARE - Administrator, Civil Service (Medical): No    Lack of Transportation (Non-Medical): No  Physical Activity: Inactive (11/18/2022)   Exercise Vital Sign    Days of Exercise per Week: 0 days    Minutes of Exercise per Session: 0 min  Stress: No Stress Concern Present (11/18/2022)   [employer] of Occupational Health - Occupational Stress Questionnaire    Feeling of Stress : Not at all  Social Connections: Socially Isolated (11/18/2022)   Social Connection and Isolation Panel [NHANES]    Frequency of Communication with Friends and Family: More than three times a week    Frequency of Social Gatherings with Friends and Family: Once a week    Attends Religious Services: Never    Database administrator or Organizations: No    Attends Banker Meetings: Never    Marital Status: Widowed    Tobacco Counseling Counseling given: Not Answered  Clinical Intake:  Pre-visit preparation completed: Yes  Pain : No/denies pain   BMI - recorded: 19.85 Nutritional Status: BMI of 19-24  Normal Nutritional Risks: None Diabetes: No   Diabetic?no        Activities of Daily Living    11/18/2022   10:41 AM  In your present state of health, do you have any difficulty performing the following activities:  Hearing? 1  Vision? 0  Difficulty concentrating or making decisions? 1  Walking or climbing stairs? 1  Dressing or bathing? 0  Doing errands,  shopping? 1  Preparing Food and eating ? Y  Comment has person to prepare  Using the Toilet? N  In the past six months, have you accidently leaked urine? Y  Do you have problems with loss of bowel control? N  Managing your Medications? Y  Managing your Finances? N  Housekeeping or managing your Housekeeping? Y    Patient Care Team: Jacky Kindle, FNP as PCP - General (Family Medicine) Marinus Maw, MD as PCP - Cardiology (Cardiology) Marinus Maw, MD  as PCP - Electrophysiology (Cardiology) Trey Sailors, PA-C (Inactive) (Physician Assistant)  Indicate any recent Medical Services you may have received from other than [facility] providers in the past year (date may be approximate).     Assessment:   This is a routine wellness examination for [place].  Hearing/Vision screen Hearing Screening - Comments:: In adequate hearing Vision Screening - Comments:: Adequate vision after cataract removed Dr Hyacinth Meeker  Dietary issues and exercise activities discussed: Current Exercise Habits: The patient does not participate in regular exercise at present, Exercise limited by: respiratory conditions(s);cardiac condition(s)   Goals Addressed   None    Depression Screen    11/18/2022   10:33 AM 07/03/2022    3:46 PM 11/10/2021    1:18 PM 03/31/2021    1:43 PM 02/05/2020    1:56 PM 11/15/2019    2:34 PM 10/20/2019    2:32 PM  PHQ 2/9 Scores  PHQ - 2 Score 0 0 1 2 1  0 0  PHQ- 9 Score  1  5   1     Fall Risk    11/18/2022   10:39 AM 11/10/2021    1:23 PM 03/31/2021    1:47 PM 11/15/2019    2:37 PM 10/20/2019    2:32 PM  Fall Risk   Falls in the past year? 0 0 0 1 1  Number falls in past yr: 0 0 0 0 0  Injury with Fall? 0 0 0 0 0  Risk for fall due to : Impaired mobility No Fall Risks No Fall Risks    Risk for fall due to: Comment pt ambulates with walker      Follow up Education provided;Falls prevention discussed Falls evaluation completed Falls evaluation completed Falls prevention  discussed     FALL RISK PREVENTION PERTAINING TO THE HOME:  Any stairs in or around the home? No  If so, are there any without handrails? No  Home free of loose throw rugs in walkways, pet beds, electrical cords, etc? Yes  Adequate lighting in your home to reduce risk of falls? Yes   ASSISTIVE DEVICES UTILIZED TO PREVENT FALLS:  Life alert? No  Use of a cane, walker or w/c? Yes walker Grab bars in the bathroom? Yes  Shower chair or bench in shower? Yes  Elevated toilet seat or a handicapped toilet? Yes    Cognitive Function:    04/19/2017   11:50 AM  MMSE - Mini Mental State Exam  Orientation to time 5  Orientation to Place 5  Registration 3  Attention/ Calculation 0  Recall 3  Language- name 2 objects 0  Language- repeat 1  Language- follow 3 step command 3  Language- read & follow direction 0  Write a sentence 0  Copy design 0  Total score 20        11/18/2022   10:44 AM 11/15/2019    2:43 PM  6CIT Screen  What Year? 0 points 0 points  What month? 0 points 0 points  What time? 0 points 0 points  Count back from 20 0 points 0 points  Months in reverse 4 points 0 points  Repeat phrase 8 points 2 points  Total Score 12 points 2 points    Immunizations Immunization History  Administered Date(s) Administered   Influenza Split 05/12/2011   Influenza, High Dose Seasonal PF 05/17/2015, 04/05/2019, 04/10/2020   Influenza,inj,Quad PF,6+ Mos 04/24/2016, 04/19/2017   Influenza-Unspecified 05/20/2013, 05/03/2014, 04/29/2018   Pneumococcal Conjugate-13 10/26/2013, 05/03/2019   Pneumococcal Polysaccharide-23  12/03/2014   Td 09/18/1994, 12/25/2008   Zoster, Live 10/06/2008    TDAP status: Up to date  Flu Vaccine status: Up to date  Pneumococcal vaccine status: Up to date  Covid-19 vaccine status: Completed vaccines  Qualifies for Shingles Vaccine? Yes   Zostavax completed No   Shingrix Completed?: No.    Education has been provided regarding the importance of  this vaccine. Patient has been advised to call insurance company to determine out of pocket expense if they have not yet received this vaccine. Advised may also receive vaccine at local pharmacy or Health Dept. Verbalized acceptance and understanding.  Screening Tests Health Maintenance  Topic Date Due   COVID-19 Vaccine (1) Never done   Zoster Vaccines- Shingrix (1 of 2) Never done   DTaP/Tdap/Td (3 - Tdap) 12/26/2018   DEXA SCAN  06/20/2020   INFLUENZA VACCINE  02/18/2023   Medicare Annual Wellness (AWV)  11/18/2023   Pneumonia Vaccine 46+ Years old  Completed   HPV VACCINES  Aged Out    Health Maintenance  Health Maintenance Due  Topic Date Due   COVID-19 Vaccine (1) Never done   Zoster Vaccines- Shingrix (1 of 2) Never done   DTaP/Tdap/Td (3 - Tdap) 12/26/2018   DEXA SCAN  06/20/2020    Colorectal cancer screening: No longer required.   Mammogram status: No longer required due to age.  Bone Density status: Completed yes. Results reflect: Bone density results: OSTEOPOROSIS. Repeat every 5 years.  Lung Cancer Screening: (Low Dose CT Chest recommended if Age 55-80 years, 30 pack-year currently smoking OR have quit w/in 15years.) does not qualify.   Lung Cancer Screening Referral: no  Additional Screening:  Hepatitis C Screening: does not qualify; Completed yes  Vision Screening: Recommended annual ophthalmology exams for early detection of glaucoma and other disorders of the eye. Is the patient up to date with their annual eye exam?  Yes  Who is the provider or what is the name of the office in which the patient attends annual eye exams? Dr. Hyacinth Meeker If pt is not established with a provider, would they like to be referred to a provider to establish care? No .   Dental Screening: Recommended annual dental exams for proper oral hygiene  Community Resource Referral / Chronic Care Management: CRR required this visit?  No   CCM required this visit?  No    Plan:     I  have personally reviewed and noted the following in the patient's chart:   Medical and social history Use of alcohol, tobacco or illicit drugs  Current medications and supplements including opioid prescriptions. Patient is not currently taking opioid prescriptions. Functional ability and status Nutritional status Physical activity Advanced directives List of other physicians Hospitalizations, surgeries, and ER visits in previous 12 months Vitals Screenings to include cognitive, depression, and falls Referrals and appointments  In addition, I have reviewed and discussed with patient certain preventive protocols, quality metrics, and best practice recommendations. A written personalized care plan for preventive services as well as general preventive health recommendations were provided to patient.     Sue Lush, LPN   07/25/1094   Nurse Notes: pt states she is doing alright. She no longer drives and has a caretaker who (answered the phone and brought to her) lives with her. Pt uses a walker for ambulating and needs assistance with everything except dressing and bathing.Pt has no concerns or questions at this time.

## 2022-11-28 ENCOUNTER — Other Ambulatory Visit: Payer: Self-pay | Admitting: Family Medicine

## 2022-11-28 DIAGNOSIS — R0609 Other forms of dyspnea: Secondary | ICD-10-CM

## 2022-11-28 DIAGNOSIS — Z85118 Personal history of other malignant neoplasm of bronchus and lung: Secondary | ICD-10-CM

## 2022-12-08 ENCOUNTER — Ambulatory Visit (INDEPENDENT_AMBULATORY_CARE_PROVIDER_SITE_OTHER): Payer: PPO | Admitting: Family Medicine

## 2022-12-08 ENCOUNTER — Encounter: Payer: Self-pay | Admitting: Family Medicine

## 2022-12-08 ENCOUNTER — Telehealth: Payer: Self-pay

## 2022-12-08 VITALS — BP 145/61 | HR 85 | Ht 66.0 in | Wt 123.0 lb

## 2022-12-08 DIAGNOSIS — I951 Orthostatic hypotension: Secondary | ICD-10-CM

## 2022-12-08 DIAGNOSIS — G3184 Mild cognitive impairment, so stated: Secondary | ICD-10-CM | POA: Insufficient documentation

## 2022-12-08 DIAGNOSIS — Z85118 Personal history of other malignant neoplasm of bronchus and lung: Secondary | ICD-10-CM | POA: Diagnosis not present

## 2022-12-08 DIAGNOSIS — R0609 Other forms of dyspnea: Secondary | ICD-10-CM | POA: Diagnosis not present

## 2022-12-08 MED ORDER — FLUTICASONE-SALMETEROL 100-50 MCG/ACT IN AEPB: INHALATION_SPRAY | RESPIRATORY_TRACT | 0 refills | Status: DC
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
## 2022-12-08 NOTE — Assessment & Plan Note (Signed)
Chronic, allow permissive hypertension given ongoing concerns for orthostatic blood pressure Will repeat CMP today to evaluate previously elevated creatinine and sodium levels

## 2022-12-08 NOTE — Telephone Encounter (Signed)
Candace will be coming with pt.
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
## 2022-12-09 ENCOUNTER — Telehealth: Payer: Self-pay | Admitting: *Deleted

## 2022-12-09 LAB — COMPREHENSIVE METABOLIC PANEL
ALT: 20 IU/L (ref 0–32)
AST: 29 IU/L (ref 0–40)
Albumin/Globulin Ratio: 1.6 (ref 1.2–2.2)
Albumin: 4.1 g/dL (ref 3.7–4.7)
Alkaline Phosphatase: 99 IU/L (ref 44–121)
BUN/Creatinine Ratio: 10 — ABNORMAL LOW (ref 12–28)
BUN: 18 mg/dL (ref 8–27)
Bilirubin Total: 0.4 mg/dL (ref 0.0–1.2)
CO2: 21 mmol/L (ref 20–29)
Calcium: 9.1 mg/dL (ref 8.7–10.3)
Chloride: 107 mmol/L — ABNORMAL HIGH (ref 96–106)
Creatinine, Ser: 1.72 mg/dL — ABNORMAL HIGH (ref 0.57–1.00)
Globulin, Total: 2.6 g/dL (ref 1.5–4.5)
Glucose: 90 mg/dL (ref 70–99)
Potassium: 4.2 mmol/L (ref 3.5–5.2)
Sodium: 144 mmol/L (ref 134–144)
Total Protein: 6.7 g/dL (ref 6.0–8.5)
eGFR: 29 mL/min/{1.73_m2} — ABNORMAL LOW (ref >59)

## 2022-12-09 NOTE — Progress Notes (Signed)
Continued decrease in kidney function with creatinine increase from 1.47 to 1.72 and filtration from 35 to 29. Continue to prioritize fluid intake with water alone; goal of 32-40 oz/day.  Please let us know if you do not hear from the referral team for assistance.

## 2022-12-09 NOTE — Progress Notes (Unsigned)
  Care Coordination  Outreach Note  12/09/2022 Name: Bianca [name] MRN: [id number] DOB: August 13, 1940   Care Coordination Outreach Attempts: An unsuccessful telephone outreach was attempted today to offer the patient information about available care coordination services.  Follow Up Plan:  Additional outreach attempts will be made to offer the patient care coordination information and services.   Encounter Outcome:  Pt. Request to Call Back  Burman Nieves, [facility] Care Coordination Care Guide Direct Dial: [phone]

## 2022-12-10 NOTE — Progress Notes (Signed)
  Care Coordination   Note   12/10/2022 Name: Bianca [name] MRN: [id number] DOB: 08/29/1940  Bianca [name] is a 82 y.o. year old female who sees Jacky Kindle, FNP for primary care. I reached out to Williemae Natter by phone today to offer care coordination services.  [name] was given information about Care Coordination services today including:   The Care Coordination services include support from the care team which includes your Nurse Coordinator, Clinical Social Worker, or Pharmacist.  The Care Coordination team is here to help remove barriers to the health concerns and goals most important to you. Care Coordination services are voluntary, and the patient may decline or stop services at any time by request to their care team member.   Care Coordination Consent Status: Patient agreed to services and verbal consent obtained.   Follow up plan:  Telephone appointment with care coordination team member scheduled for:  12/16/2022  Encounter Outcome:  Pt. Scheduled from referral   Burman Nieves, [facility] Care Coordination Care Guide Direct Dial: [phone]

## 2022-12-10 NOTE — Progress Notes (Signed)
  Care Coordination  Outreach Note  12/10/2022 Name: Bianca [name] MRN: [id number] DOB: [birth year]   Care Coordination Outreach Attempts: A second unsuccessful outreach was attempted today to offer the patient with information about available care coordination services.  Follow Up Plan:  Additional outreach attempts will be made to offer the patient care coordination information and services.   Encounter Outcome:  Pt. Request to Call Back  Burman Nieves, [facility] Care Coordination Care Guide Direct Dial: [phone]

## 2022-12-11 ENCOUNTER — Telehealth: Payer: Self-pay

## 2022-12-11 DIAGNOSIS — R300 Dysuria: Secondary | ICD-10-CM | POA: Diagnosis not present

## 2022-12-11 DIAGNOSIS — N184 Chronic kidney disease, stage 4 (severe): Secondary | ICD-10-CM | POA: Diagnosis not present

## 2022-12-11 DIAGNOSIS — N390 Urinary tract infection, site not specified: Secondary | ICD-10-CM | POA: Diagnosis not present

## 2022-12-11 NOTE — Telephone Encounter (Signed)
Spoke with patient's dtr Candace (DPR) who reports patient has been having more near-syncopal episodes recently as well as increased weakness to where she is needing assistance with ADLS more often. She is also concerned her mother may be getting a UTI as patient seems to be urinating frequently even though "it's been hard to get my mother to drink enough water lately."  Candace providing the following BP readings but does not know if these readings were taken before or after medication administration:  12/04/22: 188/63 HR 78 (had near-syncope at this time)     135/76 HR 99 (later reading)  12/05/22: 200/170 (dtr unsure what time of day this was taken or if patient had symptoms)  12/06/22: 154/90 HR 90  12/07/22: 127/79 HR 89 (had near-syncope this day)  12/08/22: 98/67  12/09/22: 150/82     112/76  12/10/22: 169/78 (had near-syncope this day)    Patient last saw Dr. Ladona Ridgel 08/21/22 where he advised to increase mestinon from BID to TID w/ meals if patient's symptoms worsened. Reviewed BP's and concerns w/ R. Keitha Butte (who saw patient in January 2024). Renee advises to increase mestinon to TID as Dr. Ladona Ridgel suggested, hydrate over the weekend and refer to PCP for UTI workup. Daughter verbalizes understanding of plan and to call us next week if patient has not improved. Also scheduled patient for f/u visit with Dr. Ladona Ridgel on 01/14/23.

## 2022-12-11 NOTE — Telephone Encounter (Signed)
[facility] RN HeartCare Triage report received / assessed, and will wait to see if Pt daughter calls HeartCare with an update next week.

## 2022-12-16 ENCOUNTER — Ambulatory Visit: Payer: Self-pay | Admitting: *Deleted

## 2022-12-16 NOTE — Patient Instructions (Signed)
Visit Information  Thank you for taking time to visit with me today. Please don't hesitate to contact me if I can be of assistance to you.   Following are the goals we discussed today: Please continue to take medications as prescribed, attend provider visits as scheduled and consult with outside agencies to address community resource needs as needed.   Our next appointment is by telephone on 12/24/22 at 1pm  Please call the care guide team at [phone] if you need to cancel or reschedule your appointment.   If you are experiencing a Mental Health or Behavioral Health Crisis or need someone to talk to, please call 911   Patient verbalizes understanding of instructions and care plan provided today and agrees to view in MyChart. Active MyChart status and patient understanding of how to access instructions and care plan via MyChart confirmed with patient.     Telephone follow up appointment with care management team member scheduled for:  [facility], [place] Clinical Social Worker  [facility] Care Management [phone]
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
## 2022-12-17 ENCOUNTER — Encounter: Payer: Self-pay | Admitting: *Deleted

## 2022-12-17 DIAGNOSIS — R3914 Feeling of incomplete bladder emptying: Secondary | ICD-10-CM | POA: Diagnosis not present

## 2022-12-17 DIAGNOSIS — N3020 Other chronic cystitis without hematuria: Secondary | ICD-10-CM | POA: Diagnosis not present

## 2022-12-17 DIAGNOSIS — N130 Hydronephrosis with ureteropelvic junction obstruction: Secondary | ICD-10-CM | POA: Diagnosis not present

## 2022-12-17 NOTE — Patient Outreach (Signed)
  Care Coordination   Multidisciplinary Case Review Note    12/17/2022 Name: Bianca [name] MRN: [id number] DOB: [birth year]  Bianca [name] is a 82 y.o. year old female who sees Jacky Kindle, FNP for primary care.  The  multidisciplinary care team met today to review patient care needs and barriers.     Goals Addressed             This Visit's Progress    Multi-disciplinary Case Discussion       Care Coordination Interventions: CSW to discuss Advanced Care Planning options with patient during next visit        SDOH assessments and interventions completed:  No     Care Coordination Interventions Activated:  Yes   Care Coordination Interventions:  Yes, provided   Follow up plan: Follow up call scheduled for 12/24/22    Multidisciplinary Team Attendees:   [place], LCSW [place], [place] George Ina, RN [place], [place] Gean Maidens, RN Scribe for Multidisciplinary Case Review:  Sincerity [place]

## 2022-12-24 ENCOUNTER — Ambulatory Visit: Payer: Self-pay | Admitting: *Deleted

## 2022-12-24 ENCOUNTER — Telehealth: Payer: Self-pay

## 2022-12-24 NOTE — Patient Outreach (Addendum)
  Care Coordination   Follow Up Visit Note   12/24/2022 Name: Bianca [name] MRN: [id number] DOB: 1941-03-14  Bianca [name] is a 82 y.o. year old female who sees Jacky Kindle, FNP for primary care. I spoke with  Bunnie Philips daughter Candice by phone today.  What matters to the patients health and wellness today?  Patient's daughter states that she is currently working with an Retail buyer related to patient's Advanced Care and [employer]   Goals Addressed             This Visit's Progress    care coordination activities       Interventions Today    Flowsheet Row Most Recent Value  Chronic Disease   Chronic disease during today's visit Other  [mild cognitive impairment]  General Interventions   General Interventions Discussed/Reviewed General Interventions Reviewed  Level of Care Personal Care Services  [patient continues to have 24/7 caregivers]  Safety Interventions   Safety Discussed/Reviewed Safety Discussed  [continue to work with Tilden Dome, attorney possibly suggesting changing POA and petition a guardian to manage patient's business affairs-plan to meet with the attorney tomorrow to discuss next steps]  Advanced Directive Interventions   Advanced Directives Discussed/Reviewed Advanced Care Planning  [Patient's daughter continues to work with Olean Ree Attorney]              SDOH assessments and interventions completed:  No     Care Coordination Interventions:  Yes, provided   Follow up plan: No further intervention required.   Encounter Outcome:  Pt. Visit Completed

## 2022-12-24 NOTE — Telephone Encounter (Signed)
Copied from CRM [id number]. Topic: General - Other >> Dec 24, 2022  3:57 PM Turkey B wrote: Reason for CRM: pt's daughter candice called in states, missed call, but I'm not seeing any new notes about what  call is about. She states she has already received lab results and shows she talked with the Child psychotherapist. Please call back if need be.

## 2022-12-24 NOTE — Patient Instructions (Addendum)
Visit Information  Thank you for taking time to visit with me today. Please don't hesitate to contact me if I can be of assistance to you.   Following are the goals we discussed today:   Patient's daughter will continue to consult with an related to patient's advanced care and estate planning.  Please call the care guide team at [phone] if you need to cancel or reschedule your appointment.   If you are experiencing a Mental Health or Behavioral Health Crisis or need someone to talk to, please call 911   Patient verbalizes understanding of instructions and care plan provided today and agrees to view in MyChart. Active MyChart status and patient understanding of how to access instructions and care plan via MyChart confirmed with patient.     Telephone follow up appointment with care management team member scheduled for:   [facility], [place] Clinical Social Worker  [facility] Care Management [phone]

## 2022-12-28 ENCOUNTER — Other Ambulatory Visit: Payer: Self-pay | Admitting: Family Medicine

## 2022-12-28 DIAGNOSIS — R0609 Other forms of dyspnea: Secondary | ICD-10-CM

## 2022-12-28 DIAGNOSIS — Z85118 Personal history of other malignant neoplasm of bronchus and lung: Secondary | ICD-10-CM

## 2022-12-29 NOTE — Telephone Encounter (Signed)
Requested Prescriptions  Pending Prescriptions Disp Refills   albuterol (VENTOLIN HFA) 108 (90 Base) MCG/ACT inhaler [Pharmacy Med Name: ALBUTEROL SULFATE HFA 108 (90 BASE)] 8.5 g 0    Sig: INHALE 2 PUFFS INTO LUNGS EVERY 6 HOURS AS NEEDED FOR WHEEZING OR SHORTNESS OF BREATH     Pulmonology:  Beta Agonists 2 Failed - 12/28/2022 10:50 AM      Failed - Last BP in normal range    BP Readings from Last 1 Encounters:  12/08/22 (!) 145/61         Passed - Last Heart Rate in normal range    Pulse Readings from Last 1 Encounters:  12/08/22 85         Passed - Valid encounter within last 12 months    Recent Outpatient Visits           3 weeks ago MCI (mild cognitive impairment)   [facility] Jacky Kindle, FNP   5 months ago DOE (dyspnea on exertion)   [facility] Jacky Kindle, FNP   1 year ago No-show for appointment   [facility] Jacky Kindle, FNP   1 year ago Age-related physical debility   [facility] Beryle Flock, Marzella Schlein, MD   1 year ago No-show for appointment   [facility] Jacky Kindle, FNP       Future Appointments             In 2 weeks Marinus Maw, MD [facility]             fluticasone-salmeterol (ADVAIR) 100-50 MCG/ACT AEPB [Pharmacy Med Name: FLUTICASONE-SALMETEROL 100-50 MCG/A] 60 each 0    Sig: INHALE ONE PUFF TWICE DAILY AS DIRECTED.RINSE MOUTH AFTER USE     Pulmonology:  [employer] Passed - 12/28/2022 10:50 AM      Passed - Valid encounter within last 12 months    Recent Outpatient Visits           3 weeks ago MCI (mild cognitive impairment)    [facility] Jacky Kindle, FNP   5 months ago DOE (dyspnea on exertion)   [facility] Jacky Kindle, FNP   1 year ago No-show for appointment   [facility] Jacky Kindle, FNP   1 year ago Age-related physical debility   [facility] Beryle Flock, Marzella Schlein, MD   1 year ago No-show for appointment   [facility] Jacky Kindle, FNP       Future Appointments             In 2 weeks Marinus Maw, MD [facility] at [facility]

## 2023-01-07 DIAGNOSIS — N390 Urinary tract infection, site not specified: Secondary | ICD-10-CM | POA: Diagnosis not present

## 2023-01-07 DIAGNOSIS — I10 Essential (primary) hypertension: Secondary | ICD-10-CM | POA: Diagnosis not present

## 2023-01-07 DIAGNOSIS — Z515 Encounter for palliative care: Secondary | ICD-10-CM | POA: Diagnosis not present

## 2023-01-07 DIAGNOSIS — I951 Orthostatic hypotension: Secondary | ICD-10-CM | POA: Diagnosis not present

## 2023-01-11 DIAGNOSIS — R3914 Feeling of incomplete bladder emptying: Secondary | ICD-10-CM | POA: Diagnosis not present

## 2023-01-11 DIAGNOSIS — N130 Hydronephrosis with ureteropelvic junction obstruction: Secondary | ICD-10-CM | POA: Diagnosis not present

## 2023-01-11 DIAGNOSIS — N3020 Other chronic cystitis without hematuria: Secondary | ICD-10-CM | POA: Diagnosis not present

## 2023-01-14 ENCOUNTER — Ambulatory Visit: Payer: PPO | Attending: Internal Medicine | Admitting: Internal Medicine

## 2023-01-14 ENCOUNTER — Telehealth: Payer: Self-pay | Admitting: Internal Medicine

## 2023-01-14 VITALS — BP 164/82 | HR 64 | Ht 66.0 in | Wt 116.0 lb

## 2023-01-14 DIAGNOSIS — I480 Paroxysmal atrial fibrillation: Secondary | ICD-10-CM | POA: Diagnosis not present

## 2023-01-14 MED ORDER — METOPROLOL SUCCINATE ER 25 MG PO TB24: 12.5000 mg | ORAL_TABLET | Freq: Every day | ORAL | 2 refills | Status: AC | PRN

## 2023-01-14 NOTE — Progress Notes (Signed)
HPI [name] returns today for follow-up. She is a very pleasant 82 year old woman with a history of severe orthostatic hypotension, lung cancer status postresection, hypertension, and syncope secondary to autonomic dysfunction. She has been on midodrine and Mestinon. Her symptoms are reasonably well-controlled. Her blood pressure has been running in the 130 range up to 160. Her appetite has improved. She has chronic fatigue but this is not any worse than it has been. She has gained a little bit of weight back. Her care taker notes that she is falling. Cannot stand much.  Allergies  Allergen Reactions   Alprazolam Other (See Comments)    Combativeness and aggression   Buspar [Buspirone] Other (See Comments)    Causes evident lethargy and is even more pronounced when taken in conjunction with Remeron   Mirtazapine Other (See Comments)    Causes evident lethargy    Other Other (See Comments)    Sedation- Causes agitation and aggression  Changing patient's "usual" medication regimen usually causes "a lot of issues with her, physically"     Current Outpatient Medications  Medication Sig Dispense Refill   acetaminophen (TYLENOL) 500 MG tablet Take 1,000 mg by mouth every 6 (six) hours as needed (for headaches).     albuterol (VENTOLIN HFA) 108 (90 Base) MCG/ACT inhaler INHALE 2 PUFFS INTO LUNGS EVERY 6 HOURS AS NEEDED FOR WHEEZING OR SHORTNESS OF BREATH 8.5 g 0   apixaban (ELIQUIS) 2.5 MG TABS tablet TAKE ONE (1) TABLET BY MOUTH TWO TIMES PER DAY 60 tablet 5   atorvastatin (LIPITOR) 20 MG tablet TAKE 1 TABLET BY MOUTH DAILY 90 tablet 0   busPIRone (BUSPAR) 7.5 MG tablet Take 7.5 mg by mouth as needed.     Calcium Carb-Cholecalciferol (CALCIUM+D3 PO) Take 1 tablet by mouth daily.     Cholecalciferol (VITAMIN D3) 50 MCG (2000 UT) TABS Take 2,000 Units by mouth daily.     ferrous gluconate (FERGON) 324 MG tablet Take 1 tablet (324 mg total) by mouth 2 (two) times daily with a meal.  3    fluticasone-salmeterol (ADVAIR) 100-50 MCG/ACT AEPB INHALE ONE PUFF TWICE DAILY AS DIRECTED.RINSE MOUTH AFTER USE 60 each 0   lactose free nutrition (BOOST) LIQD Take 237 mLs by mouth 2 (two) times daily between meals.     metoprolol succinate (TOPROL-XL) 25 MG 24 hr tablet TAKE 1/2 TABLET BY MOUTH DAILY 45 tablet 2   midodrine (PROAMATINE) 10 MG tablet TAKE 1 TABLET BY MOUTH 3 TIMES DAILY WITH MEALS 270 tablet 3   Multiple Vitamins-Minerals (MULTIVITAMIN WITH MINERALS) tablet Take 1 tablet by mouth daily.     nitrofurantoin, macrocrystal-monohydrate, (MACROBID) 100 MG capsule Take 100 mg by mouth at bedtime.     pyridostigmine (MESTINON) 60 MG tablet TAKE 0.5 TABLET (30 MG TOTAL) BY MOUTH IN THE MORNING AND AT BEDTIME. (Patient taking differently: Take 30 mg by mouth 3 (three) times daily. TAKE 0.5 TABLET (30 MG TOTAL) BY MOUTH IN THE MORNING AND AT BEDTIME.) 90 tablet 3   trimethoprim (TRIMPEX) 100 MG tablet Take 100 mg by mouth daily.     vitamin C (ASCORBIC ACID) 500 MG tablet Take 500 mg by mouth daily.      No current facility-administered medications for this visit.     Past Medical History:  Diagnosis Date   Anxiety    Arthritis    Asthma    COPD (chronic obstructive pulmonary disease) (HCC)    HOH (hard of hearing)  Hypertension    Wears dentures    top and partial bottom   Wears glasses     ROS:   All systems reviewed and negative except as noted in the HPI.   Past Surgical History:  Procedure Laterality Date   APPENDECTOMY     BREAST LUMPECTOMY WITH NEEDLE LOCALIZATION Right 04/21/2013   Procedure: BREAST LUMPECTOMY WITH NEEDLE LOCALIZATION;  Surgeon: Shelly Rubenstein, MD;  Location: [facility];  Service: General;  Laterality: Right;   BREAST SURGERY  1985   cyst rt br   MASTECTOMY W/ SENTINEL NODE BIOPSY Left 04/21/2013   Procedure: MASTECTOMY WITH SENTINEL LYMPH NODE BIOPSY;  Surgeon: Shelly Rubenstein, MD;  Location: [facility];  Service: General;  Laterality: Left;   OTHER SURGICAL HISTORY     cysts removed from foot   OTHER SURGICAL HISTORY     cysts removed from breast   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   Procedure: TOTAL HIP ARTHROPLASTY ANTERIOR APPROACH;  Surgeon: Kathryne Hitch, MD;  Location: [facility];  Service: Orthopedics;  Laterality: Right;   TUBAL LIGATION       Family History  Problem Relation Age of Onset   CAD Mother        possible small heart attack   Thyroid disease Sister        she thinks hyperthyroidism     Social History   Socioeconomic History   Marital status: Widowed    Spouse name: Not on file   Number of children: 2   Years of education: Not on file   Highest education level: Some college, no degree  Occupational History   Occupation: retired    Associate Professor: RETIRED  Tobacco Use   Smoking status: Former    Packs/day: 0.50    Years: 64.00    Additional pack years: 0.00    Total pack years: 32.00    Types: Cigarettes    Start date: 07/20/1954    Quit date: 03/19/2019    Years since quitting: 3.8   Smokeless tobacco: Never  Vaping Use   Vaping Use: Never used  Substance and Sexual Activity   Alcohol use: No   Drug use: No   Sexual activity: Not on file    Comment: 1-2 daily  Other Topics Concern   Not on file  Social History Narrative   ** Merged History Encounter **       Has a living will    Social Determinants of Health   Financial Resource Strain: Low Risk  (11/18/2022)   Overall Financial Resource Strain (CARDIA)    Difficulty of Paying Living Expenses: Not very hard  Food Insecurity: No Food Insecurity (12/16/2022)   Hunger Vital Sign    Worried About Running Out of Food in the Last Year: Never true    Ran Out of Food in the Last Year: Never true  Transportation Needs: No Transportation Needs (12/16/2022)   PRAPARE - Administrator, Civil Service (Medical): No    Lack of Transportation (Non-Medical): No  Physical  Activity: Inactive (11/18/2022)   Exercise Vital Sign    Days of Exercise per Week: 0 days    Minutes of Exercise per Session: 0 min  Stress: No Stress Concern Present (11/18/2022)   [employer] of Occupational Health - Occupational Stress Questionnaire    Feeling of Stress : Not at all  Social Connections: Socially Isolated (11/18/2022)   Social Connection and Isolation Panel [NHANES]  Frequency of Communication with Friends and Family: More than three times a week    Frequency of Social Gatherings with Friends and Family: Once a week    Attends Religious Services: Never    Database administrator or Organizations: No    Attends Banker Meetings: Never    Marital Status: Widowed  Intimate Partner Violence: Not At Risk (11/18/2022)   Humiliation, Afraid, Rape, and Kick questionnaire    Fear of Current or Ex-Partner: No    Emotionally Abused: No    Physically Abused: No    Sexually Abused: No     BP (!) 164/82   Pulse 64   Ht 5\' 6"  (1.676 m)   Wt 116 lb (52.6 kg)   LMP  (LMP Unknown)   SpO2 97%   BMI 18.72 kg/m   Physical Exam:  Well appearing NAD HEENT: Unremarkable Neck:  No JVD, no thyromegally Lymphatics:  No adenopathy Back:  No CVA tenderness Lungs:  Clear HEART:  Regular rate rhythm, no murmurs, no rubs, no clicks Abd:  soft, positive bowel sounds, no organomegally, no rebound, no guarding Ext:  2 plus pulses, no edema, no cyanosis, no clubbing Skin:  No rashes no nodules Neuro:  CN II through XII intact, motor grossly intact  EKG  DEVICE  Normal device function.  See PaceArt for details.   Assess/Plan:  Autonomic dysfunction -overall, she appears a bit better.  She is tolerating the Mestinon and midodrine.  She is taking Mestinon twice a day and I told her that if needed she could take an extra half dose around lunchtime.  2. Failure to thrive - her weight is down. I suspect that much of her problems are neurologic. Today I have reviewed her  old MRI and CT scans and she has significant atrophy.    Glean Hess, MD

## 2023-01-14 NOTE — Patient Instructions (Addendum)
Medication Instructions:  Your physician has recommended you make the following change in your medication:  STOP Eliquis STOP Metoprolol (Toprol) -- only take it once a day if needed if top blood pressure number is 180 or greater.  *If you need a refill on your cardiac medications before your next appointment, please call your pharmacy*   Lab Work: None ordered   Testing/Procedures: None ordered   Follow-Up: At [facility], you and your health needs are our priority.  As part of our continuing mission to provide you with exceptional heart care, we have created designated Provider Care Teams.  These Care Teams include your primary Cardiologist (physician) and Advanced Practice Providers (APPs -  Physician Assistants and Nurse Practitioners) who all work together to provide you with the care you need, when you need it.  Your next appointment:   6 month(s)  The format for your next appointment:   In Person  Provider:   Lewayne Bunting, MD{   Thank you for choosing [facility]!!    [phone]

## 2023-01-14 NOTE — Telephone Encounter (Signed)
STAT if patient feels like he/she is going to faint   Are you dizzy now? No   Do you feel faint or have you passed out? Felt faint at the time, but did not pass out.   Do you have any other symptoms? Weakness   Have you checked your HR and BP (record if available)? Not currently, daughter states she will call care taker and get readings.

## 2023-01-14 NOTE — Telephone Encounter (Signed)
Spoke with pt's daughter, DPR, who reports pt has an appointment to see Dr Ladona Ridgel today at 315pm.  Pt's daughter reports pt's orthostatic hypotension does seem to be worsening.  Pt would have fallen this morning if caretaker had not been there to catch her.  Pt's daughter is concerned and wanted to make sure Dr Ladona Ridgel is aware. Pt advised will discuss concerns with Dr Ladona Ridgel prior to pt's visit this afternoon and daughter should be able to review any recommendations on pt's paperwork and pt's MyChart.  Pt's daughter verbalizes understanding and thanked Charity fundraiser for the call.

## 2023-01-19 NOTE — Telephone Encounter (Signed)
This has been resolved

## 2023-01-25 ENCOUNTER — Other Ambulatory Visit: Payer: Self-pay | Admitting: Family Medicine

## 2023-01-25 DIAGNOSIS — R0609 Other forms of dyspnea: Secondary | ICD-10-CM

## 2023-01-25 DIAGNOSIS — Z85118 Personal history of other malignant neoplasm of bronchus and lung: Secondary | ICD-10-CM

## 2023-02-04 ENCOUNTER — Other Ambulatory Visit: Payer: Self-pay | Admitting: Internal Medicine

## 2023-02-04 NOTE — Telephone Encounter (Signed)
Pharmacy is stating that pt was told that she could take a extra dose of the medication pyridostigmine. This was not done at last office visit. You covered Dr. Ladona Ridgel. Please address

## 2023-02-11 NOTE — Telephone Encounter (Signed)
Pt's medication was resent to pt's pharmacy as stated per Dory Horn, RN. Pt was told to take an extra dose of medication pyridostigmine 60 mg tablet at lunchtime. Confirmation received.

## 2023-02-26 ENCOUNTER — Other Ambulatory Visit: Payer: Self-pay | Admitting: Family Medicine

## 2023-02-26 DIAGNOSIS — Z85118 Personal history of other malignant neoplasm of bronchus and lung: Secondary | ICD-10-CM

## 2023-02-26 DIAGNOSIS — R0609 Other forms of dyspnea: Secondary | ICD-10-CM

## 2023-03-01 NOTE — Telephone Encounter (Signed)
Requested medication (s) are due for refill today: Yes  Requested medication (s) are on the active medication list: Yes  Last refill:  01/25/23.  Future visit scheduled: No  Notes to clinic:  See request.

## 2023-04-14 ENCOUNTER — Other Ambulatory Visit: Payer: Self-pay | Admitting: Family Medicine

## 2023-04-14 DIAGNOSIS — Z77120 Contact with and (suspected) exposure to mold (toxic): Secondary | ICD-10-CM

## 2023-04-15 ENCOUNTER — Ambulatory Visit: Payer: PPO | Admitting: Family Medicine

## 2023-04-20 ENCOUNTER — Ambulatory Visit (INDEPENDENT_AMBULATORY_CARE_PROVIDER_SITE_OTHER): Payer: PPO | Admitting: Family Medicine

## 2023-04-20 ENCOUNTER — Encounter: Payer: Self-pay | Admitting: Family Medicine

## 2023-04-20 VITALS — BP 164/82 | HR 81 | Ht 66.0 in | Wt 116.3 lb

## 2023-04-20 DIAGNOSIS — G3183 Dementia with Lewy bodies: Secondary | ICD-10-CM | POA: Diagnosis not present

## 2023-04-20 DIAGNOSIS — I10 Essential (primary) hypertension: Secondary | ICD-10-CM | POA: Diagnosis not present

## 2023-04-20 DIAGNOSIS — Z85118 Personal history of other malignant neoplasm of bronchus and lung: Secondary | ICD-10-CM

## 2023-04-20 DIAGNOSIS — Z77120 Contact with and (suspected) exposure to mold (toxic): Secondary | ICD-10-CM | POA: Diagnosis not present

## 2023-04-20 DIAGNOSIS — I951 Orthostatic hypotension: Secondary | ICD-10-CM

## 2023-04-20 DIAGNOSIS — F02B0 Dementia in other diseases classified elsewhere, moderate, without behavioral disturbance, psychotic disturbance, mood disturbance, and anxiety: Secondary | ICD-10-CM | POA: Insufficient documentation

## 2023-04-20 NOTE — Assessment & Plan Note (Signed)
Further consult recommend with pulm given daughter request for concerns for mold exposure within the home and insurance concerns regarding fixing vs moving her mother, the pt.

## 2023-04-20 NOTE — Progress Notes (Signed)
Established patient visit   Patient: Bianca [name]   DOB: 1941/02/09   82 y.o. Female  MRN: [id number] Visit Date: 04/20/2023  Today's healthcare provider: Jacky Kindle, FNP  Introduced to nurse practitioner role and practice setting.  All questions answered.  Discussed provider/patient relationship and expectations.  Subjective    HPI HPI     Medical Management of Chronic Issues    Additional comments: Follow for cough, would like to discuss the reason behind the cough: possible mold       Last edited by Rolly Salter, CMA on 04/20/2023  3:05 PM.       Discussed the use of AI scribe software for clinical note transcription with the patient, who gave verbal consent to proceed.  History of Present Illness         The patient, with a history of orthostatic hypotension managed with Mestinon, presents with a persistent cough. The cough has improved but is still present. The patient was potentially exposed to mold at home, which may be contributing to the cough. The patient's blood pressure is high, measuring 192/83 at the current visit, up from 164 at the last visit. The patient's medication, Mestinon, is noted to increase blood pressure but is necessary to manage the orthostatic hypotension. The patient denies any recent falls. The patient also reports intermittent swelling in the ankles. Caregiver declines blood pressure changes given concern for orthostatic changes.  Medications: Outpatient Medications Prior to Visit  Medication Sig   acetaminophen (TYLENOL) 500 MG tablet Take 1,000 mg by mouth every 6 (six) hours as needed (for headaches).   albuterol (VENTOLIN HFA) 108 (90 Base) MCG/ACT inhaler INHALE 2 PUFFS INTO LUNGS EVERY 6 HOURS AS NEEDED FOR WHEEZING OR SHORTNESS OF BREATH   atorvastatin (LIPITOR) 20 MG tablet TAKE 1 TABLET BY MOUTH DAILY   busPIRone (BUSPAR) 7.5 MG tablet Take 7.5 mg by mouth as needed.   Calcium Carb-Cholecalciferol (CALCIUM+D3 PO) Take 1 tablet  by mouth daily.   Cholecalciferol (VITAMIN D3) 50 MCG (2000 UT) TABS Take 2,000 Units by mouth daily.   ferrous gluconate (FERGON) 324 MG tablet Take 1 tablet (324 mg total) by mouth 2 (two) times daily with a meal.   fluticasone-salmeterol (ADVAIR) 100-50 MCG/ACT AEPB Inhale 1 puff into the lungs 2 (two) times daily. RINSE MOUTH AFTER USE   lactose free nutrition (BOOST) LIQD Take 237 mLs by mouth 2 (two) times daily between meals.   metoprolol succinate (TOPROL XL) 25 MG 24 hr tablet Take 0.5 tablets (12.5 mg total) by mouth daily as needed (for blood pressure greater than 180).   midodrine (PROAMATINE) 10 MG tablet TAKE 1 TABLET BY MOUTH 3 TIMES DAILY WITH MEALS   Multiple Vitamins-Minerals (MULTIVITAMIN WITH MINERALS) tablet Take 1 tablet by mouth daily.   nitrofurantoin, macrocrystal-monohydrate, (MACROBID) 100 MG capsule Take 100 mg by mouth at bedtime.   pyridostigmine (MESTINON) 60 MG tablet TAKE 0.5 TABLET (30 MG TOTAL) BY MOUTH IN THE MORNING AND AT BEDTIME. May take an extra dose at lunchtime.   trimethoprim (TRIMPEX) 100 MG tablet Take 100 mg by mouth daily.   vitamin C (ASCORBIC ACID) 500 MG tablet Take 500 mg by mouth daily.    No facility-administered medications prior to visit.     Objective    BP (!) 164/82   Pulse 81   Ht 5\' 6"  (1.676 m)   Wt 116 lb 4.8 oz (52.8 kg)   LMP  (LMP Unknown)   BMI  18.77 kg/m   Physical Exam Constitutional:      Appearance: Normal appearance. She is underweight.  Cardiovascular:     Rate and Rhythm: Normal rate.     Pulses: Normal pulses.     Heart sounds: Normal heart sounds.  Pulmonary:     Effort: Pulmonary effort is normal.     Breath sounds: Normal breath sounds. No wheezing, rhonchi or rales.  Chest:     Chest wall: No tenderness.  Neurological:     Mental Status: She is alert. She is disoriented.     Motor: Weakness present.  Psychiatric:        Mood and Affect: Mood normal.        Behavior: Behavior normal.         Thought Content: Thought content normal.        Judgment: Judgment normal.     No results found for any visits on 04/20/23.  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      Mold exposure  Hypertension Elevated blood pressure readings in office (192/83 today, 164 previously). Patient recently took medication prior to visit. Noted that medication is used to prevent sudden drops in blood pressure due to orthostatic hypotension. -Monitor blood pressure closely.  Chronic Cough Improvement noted. Concerns about potential mold exposure at home. -Referral to pulmonology for further evaluation and management.  Intermittent lower extremity edema No current swelling noted. Patient wearing compression stockings. -Continue current management and monitor for changes.  General Health Maintenance -Confirmed receipt of flu vaccine. -No recent falls reported. Return if symptoms worsen or fail to improve.     Leilani Merl, FNP, have reviewed all documentation for this visit. The documentation on 04/20/23 for  the exam, diagnosis, procedures, and orders are all accurate and complete.  Jacky Kindle, FNP  [facility] [phone] (phone) [phone] (fax)  [facility]

## 2023-04-20 NOTE — Patient Instructions (Signed)
Talbert Forest,   I am sending this message regarding a referral that was sent to [place] at [facility].    Your doctor is requesting you see one of our lung specialists. Please call [phone] to get on the schedule.    Thank you,    Efraim Kaufmann

## 2023-04-20 NOTE — Assessment & Plan Note (Signed)
Chronic, with known mitral regurg on imaging; unable to be heard on exam BP variable from 160-190s however, complaints of orthostatic changes with position and dizziness on standing F/b cardiology

## 2023-04-20 NOTE — Assessment & Plan Note (Signed)
Chronic, worsening Presents with caregiver; unable to answer questions regarding next steps and concern for cough Leading behavior from caregiver to patient on yes, that is the reason pt is here

## 2023-04-23 ENCOUNTER — Encounter: Payer: Self-pay | Admitting: Pulmonary Disease

## 2023-05-05 ENCOUNTER — Other Ambulatory Visit: Payer: Self-pay | Admitting: Family Medicine

## 2023-05-05 NOTE — Telephone Encounter (Signed)
Requested medication (s) are due for refill today: yes  Requested medication (s) are on the active medication list: yes    Last refill: 01/25/23  #90 0 refills  Future visit scheduled no  Notes to clinic:Failed due to labs, please review. Thank you.  Requested Prescriptions  Pending Prescriptions Disp Refills   atorvastatin (LIPITOR) 20 MG tablet [Pharmacy Med Name: ATORVASTATIN CALCIUM 20 MG TAB] 90 tablet 0    Sig: TAKE 1 TABLET BY MOUTH DAILY     Cardiovascular:  Antilipid - Statins Failed - 05/05/2023  3:16 PM      Failed - Lipid Panel in normal range within the last 12 months    Cholesterol, Total  Date Value Ref Range Status  10/30/2019 150 100 - 199 mg/dL Final   Cholesterol  Date Value Ref Range Status  07/15/2020 167 0 - 200 mg/dL Final   LDL Chol Calc (NIH)  Date Value Ref Range Status  10/30/2019 86 0 - 99 mg/dL Final   LDL Cholesterol  Date Value Ref Range Status  07/15/2020 99 0 - 99 mg/dL Final    Comment:           Total Cholesterol/HDL:CHD Risk Coronary Heart Disease Risk Table                     Men   Women  1/2 Average Risk   3.4   3.3  Average Risk       5.0   4.4  2 X Average Risk   9.6   7.1  3 X Average Risk  23.4   11.0        Use the calculated Patient Ratio above and the CHD Risk Table to determine the patient's CHD Risk.        ATP III CLASSIFICATION (LDL):  <100     mg/dL   Optimal  161-096  mg/dL   Near or Above                    Optimal  130-159  mg/dL   Borderline  045-409  mg/dL   High  >811     mg/dL   Very High Performed at [facility], 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, psychotic disturbance, mood disturbance, or anxiety (HCC)   [place] [facility] Merita Norton T, FNP   4 months ago MCI (mild cognitive impairment)   [facility] Jacky Kindle, FNP   10 months ago DOE (dyspnea on exertion)   [facility] Jacky Kindle, FNP   1 year ago No-show for appointment   [facility] Jacky Kindle, FNP   2 years ago Age-related physical debility   [facility] [place], Marzella Schlein, MD       Future Appointments             In 2 months Marinus Maw, MD [facility]            Signed Prescriptions Disp Refills   albuterol (VENTOLIN HFA) 108 (90 Base) MCG/ACT inhaler 8.5 g 0    Sig: INHALE 2 PUFFS INTO LUNGS EVERY 6 HOURS AS NEEDED FOR WHEEZING OR SHORTNESS OF BREATH     Pulmonology:  Beta Agonists 2 Failed - 05/05/2023  3:16 PM      Failed - Last BP in normal range    BP Readings from Last 1 Encounters:  04/20/23 (!) 164/82         Passed - Last Heart Rate in normal range    Pulse Readings from Last 1 Encounters:  04/20/23 81         Passed - Valid encounter within last 12 months    Recent Outpatient Visits           2 weeks ago 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, psychotic disturbance, mood disturbance, or anxiety (HCC)   [facility] Merita Norton T, FNP   4 months ago MCI (mild cognitive impairment)   [facility] Jacky Kindle, FNP   10 months ago DOE (dyspnea on exertion)   [facility], FNP   1 year ago No-show for appointment   [facility] Merita Norton T,  FNP   2 years ago Age-related physical debility   [facility] [place], Marzella Schlein, MD       Future Appointments             In 2 months Marinus Maw, MD [facility] at [facility]

## 2023-05-05 NOTE — Telephone Encounter (Signed)
Requested Prescriptions  Pending Prescriptions Disp Refills   albuterol (VENTOLIN HFA) 108 (90 Base) MCG/ACT inhaler [Pharmacy Med Name: ALBUTEROL SULFATE HFA 108 (90 BASE)] 8.5 g 0    Sig: INHALE 2 PUFFS INTO LUNGS EVERY 6 HOURS AS NEEDED FOR WHEEZING OR SHORTNESS OF BREATH     Pulmonology:  Beta Agonists 2 Failed - 05/05/2023  3:16 PM      Failed - Last BP in normal range    BP Readings from Last 1 Encounters:  04/20/23 (!) 164/82         Passed - Last Heart Rate in normal range    Pulse Readings from Last 1 Encounters:  04/20/23 81         Passed - Valid encounter within last 12 months    Recent Outpatient Visits           2 weeks ago 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, psychotic disturbance, mood disturbance, or anxiety (HCC)   [facility] Norton T, FNP   4 months ago MCI (mild cognitive impairment)   [place] [facility] Merita Norton T, FNP   10 months ago DOE (dyspnea on exertion)   [facility] Mer[facility] Norton T, FNP   1 year ago No-show for appointment   [facility] Merita Norton T, FNP   2 years ago Age-related physical debility   [place] [facility] [place], Marzella Schlein, MD       Future Appointments             In 2 months Marinus Maw, MD [facility] at [facility]             atorvastatin (LIPITOR) 20 MG tablet [Pharmacy Med Name: ATORVASTATIN CALCIUM 20 MG TAB] 90 tablet 0    Sig: TAKE 1 TABLET BY MOUTH DAILY     Cardiovascular:  Antilipid - Statins Failed - 05/05/2023  3:16 PM      Failed - Lipid Panel in normal range within the last 12 months    Cholesterol, Total  Date Value Ref Range Status  10/30/2019 150 100 - 199 mg/dL Final   Cholesterol  Date Value Ref Range Status  07/15/2020 167 0 - 200 mg/dL Final   LDL Chol Calc (NIH)  Date Value Ref Range Status  10/30/2019 86 0  - 99 mg/dL Final   LDL Cholesterol  Date Value Ref Range Status  07/15/2020 99 0 - 99 mg/dL Final    Comment:           Total Cholesterol/HDL:CHD Risk Coronary Heart Disease Risk Table                     Men   Women  1/2 Average Risk   3.4   3.3  Average Risk       5.0   4.4  2 X Average Risk   9.6   7.1  3 X Average Risk  23.4   11.0        Use the calculated Patient Ratio above and the CHD Risk Table to determine the patient's CHD Risk.        ATP III CLASSIFICATION (LDL):  <100     mg/dL   Optimal  045-409  mg/dL   Near or Above                    Optimal  130-159  mg/dL   Borderline  528-413  mg/dL   High  >244     mg/dL   Very High Performed at [facility], 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, psychotic disturbance, mood disturbance, or anxiety (HCC)   [place] [facility] Merita Norton T, FNP   4 months ago MCI (mild cognitive impairment)   [facility] Jacky Kindle, FNP   10 months ago DOE (dyspnea on exertion)   [facility], FNP   1 year ago No-show for appointment   [facility]y [facility], FNP   2 years ago Age-related physical debility   [facility] [place], Marzella Schlein, MD       Future Appointments             In 2 months Marinus Maw, MD [facility] at [facility],  [facility]

## 2023-05-18 DIAGNOSIS — N130 Hydronephrosis with ureteropelvic junction obstruction: Secondary | ICD-10-CM | POA: Diagnosis not present

## 2023-05-18 DIAGNOSIS — R3914 Feeling of incomplete bladder emptying: Secondary | ICD-10-CM | POA: Diagnosis not present

## 2023-06-08 ENCOUNTER — Telehealth: Payer: Self-pay

## 2023-06-08 ENCOUNTER — Other Ambulatory Visit: Payer: Self-pay | Admitting: Family Medicine

## 2023-06-08 NOTE — Telephone Encounter (Signed)
Copied from CRM [id number]. Topic: General - Other >> Jun 08, 2023  2:00 PM Phill Myron wrote: Pt Daughter is calling asking for advice as to getting assistance or help with her mother. She has a live in care giver but the caregiver would like to take a vacation and/or spend time with her family. Maybe Respite care? Daughter says she can not lift he mother, to help out. Please advise

## 2023-06-10 ENCOUNTER — Other Ambulatory Visit: Payer: Self-pay

## 2023-06-10 NOTE — Patient Outreach (Signed)
  Care Management   Visit Note  06/10/2023 Name: MILLIANI JORY MRN: [id number] DOB: 1941/06/13  Subjective: Bianca [name] is a 82 y.o. year old female who is a primary care patient of Jacky Kindle, FNP. The Care Management team was consulted for assistance.      Call received from Ms. Loughridge's daughter Marylene Land.   Plan:

## 2023-06-18 ENCOUNTER — Telehealth: Payer: Self-pay | Admitting: *Deleted

## 2023-06-18 NOTE — Patient Outreach (Signed)
  Care Coordination   Initial Visit Note   06/18/2023 Name: BENEDICTA REVARD MRN: [id number] DOB: 1941/05/08  JANETE RECHNER is a 82 y.o. year old female who sees Jacky Kindle, FNP for primary care. I spoke with  Bunnie Philips daughter [name] by phone today.  What matters to the patients health and wellness today?  Patient's daughter confirms that she now has Financial POA.  Discussed history of conflicts with dividing patient's assets. Patient continues to have 24/7 private duty care, however hours will shift in January 2025. Patient's daughter plans to have patient reside with her every other weekend and is requesting assistance with a hospital bed and a freedom chair for transport.  Per patient's daughter, the Will has been re-done each sister to now get 50/50 of patient's assets.  Needs assessment interrupted with a phone call, to be completed on 12/2.   Goals Addressed             This Visit's Progress    care coordination activities       Interventions Today    Flowsheet Row Most Recent Value  Chronic Disease   Chronic disease during today's visit Other  [lewy body dementia]  General Interventions   General Interventions Discussed/Reviewed General Interventions Discussed, Level of Care, Durable Medical Equipment (DME)  [partial needs assessment completed-daughter-[name] states that she now has Financial POA-pt  resides with daughter bi-weekly Will re-done, pt's assets now split 50/50 with daughters]  Durable Medical Equipment (DME) Other  [patient's daughter requesting hospital bed and "freedom chair"-CSW to collaborate with RNCM]  Level of Care Personal Care Services  [patient has private duty aid  24/7 per week, hours will be shifting in January]  Education Interventions   Education Provided Provided Education  Provided Verbal Education On [employer] Care]              SDOH assessments and interventions completed:  No     Care Coordination  Interventions:  Yes, provided   Follow up plan: Follow up call scheduled for 06/21/23    Encounter Outcome:  Patient Visit Completed

## 2023-06-18 NOTE — Patient Instructions (Signed)
Visit Information  Thank you for taking time to visit with me today. Please don't hesitate to contact me if I can be of assistance to you.   Following are the goals we discussed today:  Patient to continue to follow up with her provider regarding any medical concerns Needs assessment to be completed on 06/21/23   Our next appointment is by telephone on 06/21/23 at 5pm  Please call the care guide team at [phone] if you need to cancel or reschedule your appointment.   If you are experiencing a Mental Health or Behavioral Health Crisis or need someone to talk to, please call 911   Patient verbalizes understanding of instructions and care plan provided today and agrees to view in MyChart. Active MyChart status and patient understanding of how to access instructions and care plan via MyChart confirmed with patient.     Telephone follow up appointment with care management team member scheduled for: 06/21/23   Verna Czech, LCSW [place]  [facility], [facility] Licensed Clinical Social Worker Care Coordinator  Direct Dial: [phone]

## 2023-06-21 ENCOUNTER — Other Ambulatory Visit: Payer: Self-pay

## 2023-06-21 ENCOUNTER — Encounter: Payer: Self-pay | Admitting: *Deleted

## 2023-06-23 ENCOUNTER — Other Ambulatory Visit: Payer: Self-pay

## 2023-07-01 DIAGNOSIS — Z681 Body mass index (BMI) 19 or less, adult: Secondary | ICD-10-CM | POA: Diagnosis not present

## 2023-07-01 DIAGNOSIS — F3342 Major depressive disorder, recurrent, in full remission: Secondary | ICD-10-CM | POA: Diagnosis not present

## 2023-07-01 DIAGNOSIS — I480 Paroxysmal atrial fibrillation: Secondary | ICD-10-CM | POA: Diagnosis not present

## 2023-07-01 DIAGNOSIS — F0393 Unspecified dementia, unspecified severity, with mood disturbance: Secondary | ICD-10-CM | POA: Diagnosis not present

## 2023-07-01 DIAGNOSIS — Z515 Encounter for palliative care: Secondary | ICD-10-CM | POA: Diagnosis not present

## 2023-07-01 DIAGNOSIS — E441 Mild protein-calorie malnutrition: Secondary | ICD-10-CM | POA: Diagnosis not present

## 2023-07-01 DIAGNOSIS — Z9181 History of falling: Secondary | ICD-10-CM | POA: Diagnosis not present

## 2023-07-01 DIAGNOSIS — D6869 Other thrombophilia: Secondary | ICD-10-CM | POA: Diagnosis not present

## 2023-07-06 ENCOUNTER — Ambulatory Visit: Payer: PPO | Attending: Internal Medicine | Admitting: Internal Medicine

## 2023-07-08 ENCOUNTER — Other Ambulatory Visit: Payer: Self-pay | Admitting: Family Medicine

## 2023-07-08 DIAGNOSIS — R0609 Other forms of dyspnea: Secondary | ICD-10-CM

## 2023-07-08 DIAGNOSIS — Z85118 Personal history of other malignant neoplasm of bronchus and lung: Secondary | ICD-10-CM

## 2023-07-08 NOTE — Telephone Encounter (Signed)
Requested Prescriptions  Pending Prescriptions Disp Refills   fluticasone-salmeterol (ADVAIR) 100-50 MCG/ACT AEPB [Pharmacy Med Name: FLUTICASONE-SALMETEROL 100-50 MCG/A] 60 each 3    Sig: INHALE ONE PUFF TWICE DAILY AS DIRECTED. 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 Jacky Kindle, FNP   2 years ago No-show for appointment   [facility]y Kindle, FNP   2 years ago Age-related physical debility   [place] [facility] [place], Marzella Schlein, MD               albuterol (VENTOLIN HFA) 108 (90 Base) MCG/ACT inhaler [Pharmacy Med Name: ALBUTEROL SULFATE HFA 108 (90 BASE)] 8.5 g 0    Sig: INHALE 2 PUFFS INTO LUNGS EVERY 6 HOURS AS NEEDED FOR WHEEZING OR SHORTNESS OF BREATH     Pulmonology:  Beta Agonists 2 Failed - 07/08/2023  4:45 PM      Failed - Last BP in normal range    BP Readings from Last 1 Encounters:  04/20/23 (!) 164/82         Passed - Last Heart Rate in normal range    Pulse Readings from Last 1 Encounters:  04/20/23 81         Passed - Valid encounter within last 12 months    Recent Outpatient Visits           2 months ago 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

## 2023-07-16 ENCOUNTER — Other Ambulatory Visit: Payer: Self-pay

## 2023-08-11 ENCOUNTER — Other Ambulatory Visit: Payer: Self-pay | Admitting: Physician Assistant

## 2023-08-12 ENCOUNTER — Ambulatory Visit: Payer: Self-pay

## 2023-08-12 ENCOUNTER — Other Ambulatory Visit: Payer: Self-pay

## 2023-08-12 NOTE — Patient Outreach (Unsigned)
Error Please Disregard

## 2023-08-19 ENCOUNTER — Other Ambulatory Visit: Payer: Self-pay

## 2023-08-26 ENCOUNTER — Ambulatory Visit: Payer: Self-pay | Admitting: Family Medicine
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
## 2023-09-10 ENCOUNTER — Telehealth: Payer: Self-pay | Admitting: Family Medicine

## 2023-09-10 MED ORDER — ALBUTEROL SULFATE HFA 108 (90 BASE) MCG/ACT IN AERS: 2.0000 | INHALATION_SPRAY | Freq: Four times a day (QID) | RESPIRATORY_TRACT | 1 refills | Status: DC | PRN

## 2023-09-10 NOTE — Addendum Note (Signed)
Addended by: Lorelei Pont D on: 09/10/2023 01:31 PM   Modules accepted: Orders

## 2023-09-10 NOTE — Telephone Encounter (Signed)
Received fax from [facility] requesting refills on Albuterol Sulfate HFA 108 (90 base)

## 2023-09-15 ENCOUNTER — Ambulatory Visit: Payer: PPO | Admitting: Family Medicine

## 2023-09-16 ENCOUNTER — Other Ambulatory Visit: Payer: Self-pay

## 2023-10-05 ENCOUNTER — Other Ambulatory Visit: Payer: Self-pay

## 2023-10-05 DIAGNOSIS — G3183 Dementia with Lewy bodies: Secondary | ICD-10-CM

## 2023-10-05 DIAGNOSIS — J449 Chronic obstructive pulmonary disease, unspecified: Secondary | ICD-10-CM

## 2023-10-05 DIAGNOSIS — I480 Paroxysmal atrial fibrillation: Secondary | ICD-10-CM

## 2023-10-05 DIAGNOSIS — G459 Transient cerebral ischemic attack, unspecified: Secondary | ICD-10-CM

## 2023-10-05 NOTE — Patient Outreach (Unsigned)
 Care Management   Visit Note  10/05/2023 Name: Bianca [name] MRN: [id number] DOB: 1941-05-27  Subjective: Bianca [name] is a 83 y.o. year old female who is a primary care patient of Bianca Provencal, FNP. Engaged with Bianca [name]'s daughter/caregiver Bianca [name] via telephone.   Assessment:  Review of patient past medical history, allergies, medications, health status, including review of consultants reports, laboratory and other test data, was performed as part of  evaluation and provision of care management services.   Outpatient Encounter Medications as of 10/05/2023  Medication Sig   acetaminophen (TYLENOL) 500 MG tablet Take 1,000 mg by mouth every 6 (six) hours as needed (for headaches).   albuterol (VENTOLIN HFA) 108 (90 Base) MCG/ACT inhaler Inhale 2 puffs into the lungs every 6 (six) hours as needed for wheezing or shortness of breath. INHALE 2 PUFFS INTO LUNGS EVERY 6 HOURS AS NEEDED FOR WHEEZING OR SHORTNESS OF BREATH   atorvastatin (LIPITOR) 20 MG tablet TAKE 1 TABLET BY MOUTH DAILY   busPIRone (BUSPAR) 7.5 MG tablet Take 7.5 mg by mouth as needed.   Calcium Carb-Cholecalciferol (CALCIUM+D3 PO) Take 1 tablet by mouth daily.   Cholecalciferol (VITAMIN D3) 50 MCG (2000 UT) TABS Take 2,000 Units by mouth daily.   ferrous gluconate (FERGON) 324 MG tablet Take 1 tablet (324 mg total) by mouth 2 (two) times daily with a meal.   fluticasone-salmeterol (ADVAIR) 100-50 MCG/ACT AEPB INHALE ONE PUFF TWICE DAILY AS DIRECTED.RINSE MOUTH AFTER USE   lactose free nutrition (BOOST) LIQD Take 237 mLs by mouth 2 (two) times daily between meals.   metoprolol succinate (TOPROL XL) 25 MG 24 hr tablet Take 0.5 tablets (12.5 mg total) by mouth daily as needed (for blood pressure greater than 180).   midodrine (PROAMATINE) 10 MG tablet TAKE 1 TABLET BY MOUTH 3 TIMES DAILY WITH MEALS   Multiple Vitamins-Minerals (MULTIVITAMIN WITH MINERALS) tablet Take 1 tablet by mouth daily.   nitrofurantoin,  macrocrystal-monohydrate, (MACROBID) 100 MG capsule Take 100 mg by mouth at bedtime.   pyridostigmine (MESTINON) 60 MG tablet TAKE 0.5 TABLET (30 MG TOTAL) BY MOUTH IN THE MORNING AND AT BEDTIME. May take an extra dose at lunchtime.   trimethoprim (TRIMPEX) 100 MG tablet Take 100 mg by mouth daily.   vitamin C (ASCORBIC ACID) 500 MG tablet Take 500 mg by mouth daily.    No facility-administered encounter medications on file as of 10/05/2023.    Interventions:   Goals Addressed             This Visit's Progress    Care Management       Effective Management of Chronic Illnesses       Current Barriers:  Care Management support and education needs related to effective management of chronic illnesses Cognitive Deficits Unable to complete ADLs and IADLs independently  Planned Interventions: Evaluation of current treatment plans, management and patient's adherence to plan as established by provider. Discussed with patient's daughter/caregiver Bianca [name]. Reports patient's cognitive decline related to dementia has worsened. Reports she is currently not mobile. Notes she has days of increased confusion/delirium but most days she remains cheerful. Reviewed medications  and indications for use. Bianca [name] and the assigned caregiver prepare medications. Reports the patient will take medications as prompted. Provided information regarding safety and fall prevention measures. Reviewed side effects that may increase risks for falls.  Bianca [name] requested assistance with transporting patient to medical appointments. Bianca [name] and caregiver have considered a transport chair to assist with transferring her in and  out of vehicles. She reports lifting the patient is still very difficult. We discussed this is not safe and increases patient's risk for injury if she falls. Referral will be placed for assistance with transportation. Discussed functional status and plan for housing. Bianca [name] notes Bianca [name] requires assistance with  ADLs and all IADLs. She is currently residing in her own home with a 24 hour sitter but will stay with Bianca [name] from 10/11/23 to 10/15/23 while her home is being repaired. She will require a sitter during this time due to the assigned sitter being on vacation that week. Visiting Bianca [name] was contacted today. The team agreed to provide coverage if an initial intake assessment can be completed this week. Bianca [name] agreed to call and schedule the assessment. Discussed goals of care. Current plan is for patient to remain in the home with 24 hour assistance. She is agreeable to outreach with Civil [employer], contracting. A referral will be placed. Thorough discussion regarding safety and fall prevention measures. Bianca [name] will update the care team with any changes or decline in patient's functional status.             Interventions Today    Flowsheet Row Most Recent Value  Chronic Disease   Chronic disease during today's visit Chronic Obstructive Pulmonary Disease (COPD), Atrial Fibrillation (AFib), Hypertension (HTN), Other  [Dementia]  General Interventions   General Interventions Discussed/Reviewed General Interventions Discussed, Labs, Lipid Profile, Durable Medical Equipment (DME), [facility], Doctor Visits, Communication with, Level of Care  Labs Kidney Function  Doctor Visits Discussed/Reviewed Doctor Visits Discussed, Specialist, PCP  Horticulturist, commercial (DME) BP Cuff, Wheelchair, Environmental consultant, Other  [Discussed need for bed rails due to increased episodes of patient attempting to get out of bed and wander in the home]  Wheelchair Standard  PCP/Specialist Visits Compliance with follow-up visit  Communication with PCP/Specialists  Level of Care Personal Care Services  [Patient does not qualified for [employer]. Daughter caregiver agreeable to assessment with [facility] to assist with in-home care needs.]  Exercise Interventions   Exercise Discussed/Reviewed Assistive device use and  maintanence  Education Interventions   Education Provided Provided Education  Provided Verbal Education On Nutrition, Eye Care, Labs, Applications, Medication, When to see the doctor, [facility], Insurance Plans  Labs Reviewed Kidney Function  Mental Health Interventions   Mental Health Discussed/Reviewed Mental Health Discussed  [Daughter reports patient has not experienced acute mood changes or signs/symptoms of depression. Reports cognitive function continues to decline but overall patients remains cheerful]  Nutrition Interventions   Nutrition Discussed/Reviewed Nutrition Discussed, Carbohydrate meal planning, Adding fruits and vegetables, Fluid intake, Increasing proteins, Decreasing fats, Decreasing salt, Supplemental nutrition  [Reports patient regularly consumes supplement (Boost or Ensure)]  Pharmacy Interventions   Pharmacy Dicussed/Reviewed Pharmacy Topics Discussed, Medications and their functions  Safety Interventions   Safety Discussed/Reviewed Fall Risk, Home Safety, Safety Reviewed  Home Safety Assistive Devices  Advanced Directive Interventions   Advanced Directives Discussed/Reviewed Advanced Directives Discussed  [Documents on file]       PLAN Bianca [name] daughter/caregiver is unable to confirm availability for next outreach due to work schedule.  Agreed to call and schedule outreach when available.     Juanell Fairly [facility] RN Care Manager Direct Dial: [phone]  Fax: [phone] Website: [email or web address]

## 2023-10-05 NOTE — Patient Instructions (Addendum)
 Thank you for allowing the Care Management team to participate in Bianca [name]'s care. It was great speaking with you today!  Reminders: Please be sure to contact [facility] to arrange the initial in-home assessment this week. A referral will be placed for outreach with the [employer] team A referral will placed for outreach with a Population Health team member to discuss options for transportation to medical appointments

## 2023-10-07 ENCOUNTER — Telehealth: Payer: Self-pay | Admitting: *Deleted

## 2023-10-07 NOTE — Progress Notes (Signed)
 Complex Care Management Note Care Guide Note  10/07/2023 Name: Bianca [name] MRN: [id number] DOB: 1941/02/03  Bianca [name] is a 83 y.o. year old female who is a primary care patient of Sallee Provencal, FNP . The community resource team was consulted for assistance with Transportation Needs   SDOH screenings and interventions completed:  Yes  Social Drivers of Health From This Encounter   Transportation Needs: No Transportation Needs (10/07/2023)   PRAPARE - Administrator, Civil Service (Medical): No    Lack of Transportation (Non-Medical): No    SDOH Interventions Today    Flowsheet Row Most Recent Value  SDOH Interventions   Transportation Interventions Community Resources Provided  [Provided information on transportation for both counties also healthstream advantage [place] pal]        Care guide performed the following interventions: Patient provided with information about care guide support team and interviewed to confirm resource needs.  Follow Up Plan:  No further follow up planned at this time. The patient has been provided with needed resources.  Encounter Outcome:  Patient Visit Completed Dione Booze  [facility] Care Guide  Direct Dial:[phone] Fax:[phone] Website: [email or web address]

## 2023-10-07 NOTE — Progress Notes (Signed)
 Complex Care Management Note Care Guide Note  10/07/2023 Name: Bianca [name] MRN: [id number] DOB: Mar 31, 1941   Complex Care Management Outreach Attempts: A second unsuccessful outreach was attempted today to offer the patient with information about available complex care management services.  Follow Up Plan:  Additional outreach attempts will be made to offer the patient complex care management information and services.   Encounter Outcome:  No Answer  [facility] Care Guide  Direct [phone] [phone] Website: [email or web address]

## 2023-10-11 ENCOUNTER — Telehealth: Payer: Self-pay | Admitting: Family Medicine

## 2023-10-11 DIAGNOSIS — R0609 Other forms of dyspnea: Secondary | ICD-10-CM

## 2023-10-11 DIAGNOSIS — Z85118 Personal history of other malignant neoplasm of bronchus and lung: Secondary | ICD-10-CM

## 2023-10-11 MED ORDER — ATORVASTATIN CALCIUM 20 MG PO TABS: 20.0000 mg | ORAL_TABLET | Freq: Every day | ORAL | 0 refills | Status: AC

## 2023-10-11 MED ORDER — FLUTICASONE-SALMETEROL 100-50 MCG/ACT IN AEPB: INHALATION_SPRAY | RESPIRATORY_TRACT | 2 refills | Status: DC

## 2023-10-11 NOTE — Telephone Encounter (Signed)
 [facility] is requesting refill fluticasone-salmeterol (ADVAIR) 100-50 MCG/ACT AEPB  & atorvastatin (LIPITOR) 20 MG tablet   Please advise

## 2023-10-11 NOTE — Telephone Encounter (Signed)
 Refilled

## 2023-10-12 ENCOUNTER — Telehealth: Payer: Self-pay

## 2023-10-12 NOTE — Telephone Encounter (Signed)
 FYI to be in the chart   Copied from CRM [id number]. Topic: General - Other >> Oct 12, 2023 11:39 AM Elle L wrote: Reason for CRM: [facility] called to inform the office that they have received a pallative order for the patient and they will be following. Their call back number is [phone] if needed.

## 2023-10-15 ENCOUNTER — Other Ambulatory Visit: Payer: Self-pay

## 2023-12-06 ENCOUNTER — Other Ambulatory Visit: Payer: Self-pay | Admitting: Internal Medicine

## 2023-12-22 DIAGNOSIS — R32 Unspecified urinary incontinence: Secondary | ICD-10-CM | POA: Diagnosis not present

## 2023-12-22 DIAGNOSIS — Z7189 Other specified counseling: Secondary | ICD-10-CM | POA: Diagnosis not present

## 2023-12-22 DIAGNOSIS — F03C18 Unspecified dementia, severe, with other behavioral disturbance: Secondary | ICD-10-CM | POA: Diagnosis not present

## 2023-12-22 DIAGNOSIS — R413 Other amnesia: Secondary | ICD-10-CM | POA: Diagnosis not present

## 2023-12-22 DIAGNOSIS — H9193 Unspecified hearing loss, bilateral: Secondary | ICD-10-CM | POA: Diagnosis not present

## 2023-12-22 DIAGNOSIS — Z1331 Encounter for screening for depression: Secondary | ICD-10-CM | POA: Diagnosis not present

## 2024-03-30 ENCOUNTER — Other Ambulatory Visit: Payer: Self-pay | Admitting: Family Medicine

## 2024-05-31 ENCOUNTER — Ambulatory Visit

## 2024-06-01 ENCOUNTER — Telehealth: Payer: Self-pay | Admitting: Family Medicine

## 2024-06-01 NOTE — Telephone Encounter (Signed)
 [facility] faxed refill request for the following medications:  fluticasone -salmeterol (ADVAIR ) 100-50 MCG/ACT AEPB     Please advise.

## 2024-06-02 ENCOUNTER — Other Ambulatory Visit: Payer: Self-pay

## 2024-06-02 DIAGNOSIS — Z85118 Personal history of other malignant neoplasm of bronchus and lung: Secondary | ICD-10-CM

## 2024-06-02 DIAGNOSIS — R0609 Other forms of dyspnea: Secondary | ICD-10-CM

## 2024-06-02 NOTE — Telephone Encounter (Signed)
 Converted into a refill request

## 2024-08-16 ENCOUNTER — Other Ambulatory Visit: Payer: Self-pay

## 2024-08-16 ENCOUNTER — Telehealth: Payer: Self-pay | Admitting: Family Medicine

## 2024-08-16 DIAGNOSIS — Z85118 Personal history of other malignant neoplasm of bronchus and lung: Secondary | ICD-10-CM

## 2024-08-16 DIAGNOSIS — R0609 Other forms of dyspnea: Secondary | ICD-10-CM

## 2024-08-16 MED ORDER — FLUTICASONE-SALMETEROL 100-50 MCG/ACT IN AEPB: INHALATION_SPRAY | RESPIRATORY_TRACT | 0 refills | Status: AC

## 2024-08-16 NOTE — Telephone Encounter (Signed)
 Refill Request   Pharmacy: [facility]  Medication:  fluticasone -salmeterol (ADVAIR ) 100-50 MCG/ACT AEPB   Quantity (if provided): 60  Please send in refill request.

## 2024-08-16 NOTE — Telephone Encounter (Signed)
 Converted to rx refill
